# Patient Record
Sex: Female | Born: 1960 | Race: Black or African American | Hispanic: No | Marital: Single | Smoking: Current every day smoker
Health system: Southern US, Community
[De-identification: ages and names within clinical notes are randomized; demographics above are authoritative.]

## PROBLEM LIST (undated history)

## (undated) DIAGNOSIS — I10 Essential (primary) hypertension: Secondary | ICD-10-CM

## (undated) DIAGNOSIS — C3490 Malignant neoplasm of unspecified part of unspecified bronchus or lung: Secondary | ICD-10-CM

## (undated) DIAGNOSIS — J449 Chronic obstructive pulmonary disease, unspecified: Secondary | ICD-10-CM

## (undated) DIAGNOSIS — E079 Disorder of thyroid, unspecified: Secondary | ICD-10-CM

## (undated) DIAGNOSIS — F0390 Unspecified dementia without behavioral disturbance: Secondary | ICD-10-CM

## (undated) HISTORY — DX: Malignant neoplasm of unspecified part of unspecified bronchus or lung: C34.90

## (undated) HISTORY — DX: Essential (primary) hypertension: I10

## (undated) HISTORY — PX: STOMACH SURGERY: SHX791

---

## 2002-08-19 ENCOUNTER — Inpatient Hospital Stay (HOSPITAL_COMMUNITY): Admission: AC | Admit: 2002-08-19 | Discharge: 2002-08-26 | Payer: Self-pay

## 2002-08-19 ENCOUNTER — Encounter: Payer: Self-pay | Admitting: Emergency Medicine

## 2002-08-19 ENCOUNTER — Encounter: Payer: Self-pay | Admitting: General Surgery

## 2002-08-20 ENCOUNTER — Encounter: Payer: Self-pay | Admitting: General Surgery

## 2002-08-21 ENCOUNTER — Encounter: Payer: Self-pay | Admitting: General Surgery

## 2002-08-22 ENCOUNTER — Encounter: Payer: Self-pay | Admitting: General Surgery

## 2002-08-23 ENCOUNTER — Encounter: Payer: Self-pay | Admitting: General Surgery

## 2003-01-14 ENCOUNTER — Encounter: Payer: Self-pay | Admitting: *Deleted

## 2003-01-14 ENCOUNTER — Emergency Department (HOSPITAL_COMMUNITY): Admission: EM | Admit: 2003-01-14 | Discharge: 2003-01-14 | Payer: Self-pay | Admitting: *Deleted

## 2003-03-21 ENCOUNTER — Encounter: Payer: Self-pay | Admitting: Orthopedic Surgery

## 2003-03-21 ENCOUNTER — Ambulatory Visit (HOSPITAL_COMMUNITY): Admission: RE | Admit: 2003-03-21 | Discharge: 2003-03-21 | Payer: Self-pay | Admitting: Orthopedic Surgery

## 2005-12-17 ENCOUNTER — Emergency Department: Payer: Self-pay | Admitting: Emergency Medicine

## 2006-07-02 ENCOUNTER — Observation Stay: Payer: Self-pay | Admitting: Internal Medicine

## 2006-07-02 ENCOUNTER — Other Ambulatory Visit: Payer: Self-pay

## 2006-07-31 ENCOUNTER — Emergency Department: Payer: Self-pay | Admitting: Emergency Medicine

## 2007-02-17 ENCOUNTER — Emergency Department: Payer: Self-pay | Admitting: Emergency Medicine

## 2008-08-04 ENCOUNTER — Emergency Department: Payer: Self-pay | Admitting: Emergency Medicine

## 2008-08-04 ENCOUNTER — Other Ambulatory Visit: Payer: Self-pay

## 2010-06-16 ENCOUNTER — Emergency Department: Payer: Self-pay | Admitting: Emergency Medicine

## 2013-03-04 ENCOUNTER — Emergency Department: Payer: Self-pay | Admitting: Emergency Medicine

## 2013-11-10 ENCOUNTER — Emergency Department: Payer: Self-pay | Admitting: Emergency Medicine

## 2013-11-10 LAB — URINALYSIS, COMPLETE
Bacteria: NONE SEEN
Glucose,UR: NEGATIVE mg/dL (ref 0–75)
Ketone: NEGATIVE
Leukocyte Esterase: NEGATIVE
Ph: 5 (ref 4.5–8.0)
Protein: NEGATIVE
Squamous Epithelial: 11

## 2013-11-10 LAB — CBC WITH DIFFERENTIAL/PLATELET
Basophil %: 1.1 %
Eosinophil #: 0.2 10*3/uL (ref 0.0–0.7)
HCT: 43.1 % (ref 35.0–47.0)
HGB: 14.6 g/dL (ref 12.0–16.0)
Lymphocyte #: 2 10*3/uL (ref 1.0–3.6)
Lymphocyte %: 25.2 %
MCHC: 34 g/dL (ref 32.0–36.0)
Monocyte #: 0.9 x10 3/mm (ref 0.2–0.9)
Neutrophil #: 4.7 10*3/uL (ref 1.4–6.5)
Neutrophil %: 59.3 %
Platelet: 257 10*3/uL (ref 150–440)
RBC: 4.88 10*6/uL (ref 3.80–5.20)
RDW: 13.7 % (ref 11.5–14.5)

## 2013-11-10 LAB — COMPREHENSIVE METABOLIC PANEL
Anion Gap: 6 — ABNORMAL LOW (ref 7–16)
Bilirubin,Total: 0.6 mg/dL (ref 0.2–1.0)
Calcium, Total: 9.7 mg/dL (ref 8.5–10.1)
Chloride: 107 mmol/L (ref 98–107)
Co2: 25 mmol/L (ref 21–32)
EGFR (African American): >60
Osmolality: 273 (ref 275–301)
Potassium: 3.6 mmol/L (ref 3.5–5.1)
SGOT(AST): 20 U/L (ref 15–37)
SGPT (ALT): 12 U/L (ref 12–78)
Sodium: 138 mmol/L (ref 136–145)
Total Protein: 8.5 g/dL — ABNORMAL HIGH (ref 6.4–8.2)

## 2014-10-03 DIAGNOSIS — R221 Localized swelling, mass and lump, neck: Secondary | ICD-10-CM | POA: Insufficient documentation

## 2014-10-03 DIAGNOSIS — K122 Cellulitis and abscess of mouth: Secondary | ICD-10-CM | POA: Insufficient documentation

## 2014-10-03 DIAGNOSIS — H9209 Otalgia, unspecified ear: Secondary | ICD-10-CM | POA: Insufficient documentation

## 2014-10-03 DIAGNOSIS — R070 Pain in throat: Secondary | ICD-10-CM | POA: Insufficient documentation

## 2014-10-13 DIAGNOSIS — C770 Secondary and unspecified malignant neoplasm of lymph nodes of head, face and neck: Secondary | ICD-10-CM | POA: Insufficient documentation

## 2014-10-13 DIAGNOSIS — C3490 Malignant neoplasm of unspecified part of unspecified bronchus or lung: Secondary | ICD-10-CM | POA: Insufficient documentation

## 2015-01-29 ENCOUNTER — Ambulatory Visit: Payer: Self-pay | Admitting: Hematology and Oncology

## 2015-02-20 ENCOUNTER — Ambulatory Visit
Admit: 2015-02-20 | Disposition: A | Discharge status: Home / Self Care | Payer: Self-pay | Attending: Hematology and Oncology | Admitting: Hematology and Oncology

## 2015-03-06 LAB — CREATININE, SERUM: Creatine, Serum: 0.69

## 2015-03-23 ENCOUNTER — Ambulatory Visit
Admit: 2015-03-23 | Disposition: A | Discharge status: Home / Self Care | Payer: Self-pay | Attending: Hematology and Oncology | Admitting: Hematology and Oncology

## 2015-03-26 DIAGNOSIS — C3490 Malignant neoplasm of unspecified part of unspecified bronchus or lung: Secondary | ICD-10-CM | POA: Insufficient documentation

## 2015-03-29 LAB — COMPREHENSIVE METABOLIC PANEL
Albumin: 4.2 g/dL
Alkaline Phosphatase: 88 U/L
Anion Gap: 8 (ref 7–16)
BUN: 10 mg/dL
Bilirubin,Total: 0.4 mg/dL
Calcium, Total: 9.5 mg/dL
Chloride: 104 mmol/L
Co2: 25 mmol/L
Creatinine: 0.65 mg/dL
EGFR (African American): >60
EGFR (Non-African Amer.): >60
Glucose: 164 mg/dL — ABNORMAL HIGH
Potassium: 3.8 mmol/L
SGOT(AST): 20 U/L
SGPT (ALT): 8 U/L — ABNORMAL LOW
Sodium: 137 mmol/L
Total Protein: 8.2 g/dL — ABNORMAL HIGH

## 2015-03-29 LAB — CBC CANCER CENTER
Basophil #: 0 x10 3/mm (ref 0.0–0.1)
Basophil %: 0.4 %
Eosinophil #: 0 x10 3/mm (ref 0.0–0.7)
Eosinophil %: 0 %
HCT: 37.3 % (ref 35.0–47.0)
HGB: 12.6 g/dL (ref 12.0–16.0)
Lymphocyte #: 1.1 x10 3/mm (ref 1.0–3.6)
Lymphocyte %: 13.8 %
MCH: 33.6 pg (ref 26.0–34.0)
MCHC: 33.9 g/dL (ref 32.0–36.0)
MCV: 99 fL (ref 80–100)
Monocyte #: 1.1 x10 3/mm — ABNORMAL HIGH (ref 0.2–0.9)
Monocyte %: 14.9 %
Neutrophil #: 5.4 x10 3/mm (ref 1.4–6.5)
Neutrophil %: 70.9 %
Platelet: 432 x10 3/mm (ref 150–440)
RBC: 3.76 10*6/uL — ABNORMAL LOW (ref 3.80–5.20)
RDW: 16.6 % — ABNORMAL HIGH (ref 11.5–14.5)
WBC: 7.6 x10 3/mm (ref 3.6–11.0)

## 2015-03-29 LAB — MAGNESIUM: Magnesium: 1.6 mg/dL — ABNORMAL LOW

## 2015-03-30 LAB — CEA: CEA: 7 ng/mL — ABNORMAL HIGH (ref 0.0–4.7)

## 2015-04-09 ENCOUNTER — Other Ambulatory Visit: Payer: Self-pay | Admitting: Hematology and Oncology

## 2015-04-09 DIAGNOSIS — C3492 Malignant neoplasm of unspecified part of left bronchus or lung: Secondary | ICD-10-CM

## 2015-04-09 LAB — COMPREHENSIVE METABOLIC PANEL
Albumin: 4 g/dL
Alkaline Phosphatase: 79 U/L
Anion Gap: 8 (ref 7–16)
BUN: 13 mg/dL
Bilirubin,Total: 0.7 mg/dL
Calcium, Total: 9.4 mg/dL
Chloride: 103 mmol/L
Co2: 26 mmol/L
Creatinine: 0.62 mg/dL
EGFR (African American): >60
EGFR (Non-African Amer.): >60
Glucose: 120 mg/dL — ABNORMAL HIGH
Potassium: 4.2 mmol/L
SGOT(AST): 19 U/L
SGPT (ALT): 9 U/L — ABNORMAL LOW
Sodium: 137 mmol/L
Total Protein: 8.2 g/dL — ABNORMAL HIGH

## 2015-04-09 LAB — CBC CANCER CENTER
Basophil #: 0.1 x10 3/mm (ref 0.0–0.1)
Basophil %: 0.7 %
Eosinophil #: 0 x10 3/mm (ref 0.0–0.7)
Eosinophil %: 0.1 %
HCT: 40.9 % (ref 35.0–47.0)
HGB: 13.7 g/dL (ref 12.0–16.0)
Lymphocyte #: 1.1 x10 3/mm (ref 1.0–3.6)
Lymphocyte %: 12.4 %
MCH: 33.5 pg (ref 26.0–34.0)
MCHC: 33.5 g/dL (ref 32.0–36.0)
MCV: 100 fL (ref 80–100)
Monocyte #: 1 x10 3/mm — ABNORMAL HIGH (ref 0.2–0.9)
Monocyte %: 11.5 %
Neutrophil #: 6.9 x10 3/mm — ABNORMAL HIGH (ref 1.4–6.5)
Neutrophil %: 75.3 %
Platelet: 238 x10 3/mm (ref 150–440)
RBC: 4.09 10*6/uL (ref 3.80–5.20)
RDW: 18.6 % — ABNORMAL HIGH (ref 11.5–14.5)
WBC: 9.1 x10 3/mm (ref 3.6–11.0)

## 2015-04-09 LAB — MAGNESIUM: Magnesium: 1.7 mg/dL

## 2015-04-18 ENCOUNTER — Other Ambulatory Visit: Payer: Self-pay | Admitting: Hematology and Oncology

## 2015-04-27 ENCOUNTER — Encounter: Admission: RE | Admit: 2015-04-27 | Payer: Medicaid Other | Source: Ambulatory Visit

## 2015-04-27 ENCOUNTER — Ambulatory Visit: Admission: RE | Admit: 2015-04-27 | Payer: Medicaid Other | Source: Ambulatory Visit

## 2015-04-30 ENCOUNTER — Inpatient Hospital Stay (HOSPITAL_BASED_OUTPATIENT_CLINIC_OR_DEPARTMENT_OTHER): Payer: Medicaid Other | Admitting: Hematology and Oncology

## 2015-04-30 ENCOUNTER — Encounter: Payer: Self-pay | Admitting: Hematology and Oncology

## 2015-04-30 ENCOUNTER — Other Ambulatory Visit: Payer: Self-pay | Admitting: Hematology and Oncology

## 2015-04-30 ENCOUNTER — Inpatient Hospital Stay: Payer: Medicaid Other | Attending: Hematology and Oncology

## 2015-04-30 ENCOUNTER — Inpatient Hospital Stay: Payer: Medicaid Other

## 2015-04-30 VITALS — BP 138/8 | HR 106 | Temp 97.3°F | Resp 18 | Wt 105.8 lb

## 2015-04-30 DIAGNOSIS — J9811 Atelectasis: Secondary | ICD-10-CM | POA: Diagnosis not present

## 2015-04-30 DIAGNOSIS — Z923 Personal history of irradiation: Secondary | ICD-10-CM | POA: Insufficient documentation

## 2015-04-30 DIAGNOSIS — M25512 Pain in left shoulder: Secondary | ICD-10-CM | POA: Diagnosis not present

## 2015-04-30 DIAGNOSIS — R079 Chest pain, unspecified: Secondary | ICD-10-CM | POA: Insufficient documentation

## 2015-04-30 DIAGNOSIS — C3490 Malignant neoplasm of unspecified part of unspecified bronchus or lung: Secondary | ICD-10-CM

## 2015-04-30 DIAGNOSIS — Z79899 Other long term (current) drug therapy: Secondary | ICD-10-CM

## 2015-04-30 DIAGNOSIS — R0602 Shortness of breath: Secondary | ICD-10-CM

## 2015-04-30 DIAGNOSIS — R05 Cough: Secondary | ICD-10-CM | POA: Insufficient documentation

## 2015-04-30 DIAGNOSIS — C779 Secondary and unspecified malignant neoplasm of lymph node, unspecified: Secondary | ICD-10-CM | POA: Insufficient documentation

## 2015-04-30 DIAGNOSIS — Z5111 Encounter for antineoplastic chemotherapy: Secondary | ICD-10-CM | POA: Insufficient documentation

## 2015-04-30 DIAGNOSIS — C3412 Malignant neoplasm of upper lobe, left bronchus or lung: Secondary | ICD-10-CM | POA: Insufficient documentation

## 2015-04-30 DIAGNOSIS — F17210 Nicotine dependence, cigarettes, uncomplicated: Secondary | ICD-10-CM | POA: Diagnosis not present

## 2015-04-30 LAB — CBC WITH DIFFERENTIAL/PLATELET
Basophils Absolute: 0 10*3/uL (ref 0–0.1)
Basophils Relative: 0 %
Eosinophils Absolute: 0 10*3/uL (ref 0–0.7)
Eosinophils Relative: 0 %
HCT: 39 % (ref 35.0–47.0)
Hemoglobin: 13.2 g/dL (ref 12.0–16.0)
Lymphocytes Relative: 16 %
Lymphs Abs: 0.7 10*3/uL — ABNORMAL LOW (ref 1.0–3.6)
MCH: 34.5 pg — ABNORMAL HIGH (ref 26.0–34.0)
MCHC: 33.9 g/dL (ref 32.0–36.0)
MCV: 101.8 fL — ABNORMAL HIGH (ref 80.0–100.0)
Monocytes Absolute: 0.6 10*3/uL (ref 0.2–0.9)
Monocytes Relative: 14 %
Neutro Abs: 3 10*3/uL (ref 1.4–6.5)
Neutrophils Relative %: 70 %
Platelets: 274 10*3/uL (ref 150–440)
RBC: 3.83 MIL/uL (ref 3.80–5.20)
RDW: 19.7 % — ABNORMAL HIGH (ref 11.5–14.5)
WBC: 4.3 10*3/uL (ref 3.6–11.0)

## 2015-04-30 LAB — COMPREHENSIVE METABOLIC PANEL
ALT: 10 U/L — ABNORMAL LOW (ref 14–54)
AST: 23 U/L (ref 15–41)
Albumin: 4.2 g/dL (ref 3.5–5.0)
Alkaline Phosphatase: 64 U/L (ref 38–126)
Anion gap: 9 (ref 5–15)
BUN: 11 mg/dL (ref 6–20)
CO2: 27 mmol/L (ref 22–32)
Calcium: 9.6 mg/dL (ref 8.9–10.3)
Chloride: 103 mmol/L (ref 101–111)
Creatinine, Ser: 0.6 mg/dL (ref 0.44–1.00)
GFR calc Af Amer: >60 mL/min (ref >60)
GFR calc non Af Amer: >60 mL/min (ref >60)
Glucose, Bld: 127 mg/dL — ABNORMAL HIGH (ref 65–99)
Potassium: 4.1 mmol/L (ref 3.5–5.1)
Sodium: 139 mmol/L (ref 135–145)
Total Bilirubin: 0.7 mg/dL (ref 0.3–1.2)
Total Protein: 8.1 g/dL (ref 6.5–8.1)

## 2015-04-30 LAB — MAGNESIUM: Magnesium: 1.6 mg/dL — ABNORMAL LOW (ref 1.7–2.4)

## 2015-04-30 MED ORDER — SODIUM CHLORIDE 0.9 % IV SOLN
500.0000 mg/m2 | Freq: Once | INTRAVENOUS | Status: AC
  Administered 2015-04-30: 700 mg via INTRAVENOUS
  Filled 2015-04-30: qty 8

## 2015-04-30 MED ORDER — SODIUM CHLORIDE 0.9 % IV SOLN
Freq: Once | INTRAVENOUS | Status: AC
  Administered 2015-04-30: 13:00:00 via INTRAVENOUS
  Filled 2015-04-30: qty 5

## 2015-04-30 MED ORDER — MEGESTROL ACETATE 40 MG/ML PO SUSP: 200.0000 mg | Freq: Two times a day (BID) | ORAL | Status: DC

## 2015-04-30 MED ORDER — DEXAMETHASONE 4 MG PO TABS: 4.0000 mg | ORAL_TABLET | Freq: Two times a day (BID) | ORAL | Status: DC

## 2015-04-30 MED ORDER — SODIUM CHLORIDE 0.9 % IV SOLN
Freq: Once | INTRAVENOUS | Status: AC
Start: 2015-04-30 — End: 2015-04-30
  Administered 2015-04-30: 13:00:00 via INTRAVENOUS
  Filled 2015-04-30: qty 250

## 2015-04-30 MED ORDER — OMEPRAZOLE 20 MG PO CPDR: 20.0000 mg | DELAYED_RELEASE_CAPSULE | Freq: Every day | ORAL | Status: DC

## 2015-04-30 MED ORDER — CARBOPLATIN CHEMO INJECTION 600 MG/60ML
410.0000 mg | Freq: Once | INTRAVENOUS | Status: AC
  Administered 2015-04-30: 410 mg via INTRAVENOUS
  Filled 2015-04-30: qty 41

## 2015-05-01 LAB — CEA: CEA: 8.7 ng/mL — ABNORMAL HIGH (ref 0.0–4.7)

## 2015-05-01 NOTE — Progress Notes (Signed)
[facility] day:  04/30/2015  Chief Complaint: Audrey Mcgee is an 54 y.o. female with metastatic poorly differentiated non-small cell lung cancer who is seen for  assessment prior to cycle #6 carboplatin and Alimta.  HPI: The patient was last seen in the medical oncology clinic on 04/09/2015.  At that time, she received cycle #5 carboplatin and Alimta. She received B12. She had just completed a course of azithromycin. Respiratory status was at baseline. Bone scan was scheduled to assess left rib and scapular pain.  Symptomatically, she feels pretty good.  She has a chronic foamy cough.  She has a "little shortness of breath now and then".  Her rib pain is gone.  She missed her appointment for a bone scan.  She has pain when she wakes up (back and left shoulder).  She is able to sleep through out the night.  She is not taking the morphine as it causes nausea.  She will take oxycodone up to 4 times a day.  She is gaining weight on Megace.  Past Medical History  Diagnosis Date  . Lung cancer    History reviewed. No pertinent past surgical history.  History reviewed. No pertinent family history.  Social History:  reports that she has been smoking Cigarettes.  She has smoked for the past 15 years. She has never used smokeless tobacco. She reports that she drinks about 1.2 oz of alcohol per week. She reports that she does not use illicit drugs.  The patient is alone today.  Allergies:  Allergies  Allergen Reactions  . No Known Allergies     Current Outpatient Prescriptions  Medication Sig Dispense Refill  . dexamethasone (DECADRON) 4 MG tablet Take 1 tablet (4 mg total) by mouth 2 (two) times daily. Take on the day before and after chemotherapy 16 tablet 0  . DIPHENHYD-LIDOCAINE-NYSTATIN MT Use as directed in the mouth or throat.    . docusate sodium (COLACE) 100 MG capsule Take 100 mg by mouth 2 (two) times daily as needed for mild  constipation.    . folic acid (FOLVITE) 939 MCG tablet Take 400 mcg by mouth daily.    [name] losartan (COZAAR) 100 MG tablet Take 100 mg by mouth daily.    . megestrol (MEGACE) 40 MG/ML suspension Take 5 mLs (200 mg total) by mouth 2 (two) times daily. 240 mL 0  . morphine (MS CONTIN) 15 MG 12 hr tablet Take 15 mg by mouth 2 (two) times daily.    [name] omeprazole (PRILOSEC) 20 MG capsule Take 1 capsule (20 mg total) by mouth daily. 1 capsule 3  . ondansetron (ZOFRAN) 8 MG tablet Take by mouth 2 (two) times daily as needed for nausea or vomiting.    . Oxycodone HCl 10 MG TABS Take by mouth every 4 (four) hours as needed.     No current facility-administered medications for this visit.   Review of Systems:  GENERAL:  Feels pretty good.  Active.  No fevers, sweats or weight loss. PERFORMANCE STATUS (ECOG):  1 HEENT:  No visual changes, runny nose, sore throat, mouth sores or tenderness. Lungs: No shortness of breath or cough.  No hemoptysis. Cardiac:  No chest pain, palpitations, orthopnea, or PND. GI:  No nausea, vomiting, diarrhea, constipation, melena or hematochezia. GU:  No urgency, frequency, dysuria, or hematuria. Musculoskeletal:  No back pain.  No joint pain.  No muscle tenderness. Extremities:  No pain or swelling. Skin:  No rashes  or skin changes. Neuro:  No headache, numbness or weakness, balance or coordination issues. Endocrine:  No diabetes, thyroid issues, hot flashes or night sweats. Psych:  No mood changes, depression or anxiety. Pain:  No focal pain. Review of systems:  All other systems reviewed and found to be negative.  Physical Exam: Blood pressure 138/8, pulse 106, temperature 97.3 F (36.3 C), temperature source Tympanic, resp. rate 18, weight 105 lb 13.1 oz (48 kg), SpO2 100 %.  GENERAL:  Thin woman sitting comfortably in the exam room in no acute distress. MENTAL STATUS:  Alert and oriented to person, place and time. HEAD:  Styled short brown wig with highlights.   Normocephalic, atraumatic, face symmetric, no Cushingoid features. EYES:  Brown eyes.  Pupils equal round and reactive to light and accomodation.  No conjunctivitis or scleral icterus. ENT:  Oropharynx clear without lesion.  Tongue normal. Mucous membranes moist.  RESPIRATORY:  Clear to auscultation without rales, wheezes or rhonchi. CARDIOVASCULAR:  Regular rate and rhythm without murmur, rub or gallop. ABDOMEN:  Soft, non-tender, with active bowel sounds, and no hepatosplenomegaly.  No masses. SKIN:  No rashes, ulcers or lesions. EXTREMITIES: No edema, no skin discoloration or tenderness.  No palpable cords. LYMPH NODES: No palpable cervical, supraclavicular, axillary or inguinal adenopathy  NEUROLOGICAL: Unremarkable. PSYCH:  Appropriate.  Infusion on 04/30/2015  Component Date Value Ref Range Status  . WBC 04/30/2015 4.3  3.6 - 11.0 K/uL Final  . RBC 04/30/2015 3.83  3.80 - 5.20 MIL/uL Final  . Hemoglobin 04/30/2015 13.2  12.0 - 16.0 g/dL Final  . HCT 04/30/2015 39.0  35.0 - 47.0 % Final  . MCV 04/30/2015 101.8* 80.0 - 100.0 fL Final  . MCH 04/30/2015 34.5* 26.0 - 34.0 pg Final  . MCHC 04/30/2015 33.9  32.0 - 36.0 g/dL Final  . RDW 04/30/2015 19.7* 11.5 - 14.5 % Final  . Platelets 04/30/2015 274  150 - 440 K/uL Final  . Neutrophils Relative % 04/30/2015 70   Final  . Neutro Abs 04/30/2015 3.0  1.4 - 6.5 K/uL Final  . Lymphocytes Relative 04/30/2015 16   Final  . Lymphs Abs 04/30/2015 0.7* 1.0 - 3.6 K/uL Final  . Monocytes Relative 04/30/2015 14   Final  . Monocytes Absolute 04/30/2015 0.6  0.2 - 0.9 K/uL Final  . Eosinophils Relative 04/30/2015 0   Final  . Eosinophils Absolute 04/30/2015 0.0  0 - 0.7 K/uL Final  . Basophils Relative 04/30/2015 0   Final  . Basophils Absolute 04/30/2015 0.0  0 - 0.1 K/uL Final  . Sodium 04/30/2015 139  135 - 145 mmol/L Final  . Potassium 04/30/2015 4.1  3.5 - 5.1 mmol/L Final  . Chloride 04/30/2015 103  101 - 111 mmol/L Final  . CO2 04/30/2015  27  22 - 32 mmol/L Final  . Glucose, Bld 04/30/2015 127* 65 - 99 mg/dL Final  . BUN 04/30/2015 11  6 - 20 mg/dL Final  . Creatinine, Ser 04/30/2015 0.60  0.44 - 1.00 mg/dL Final  . Calcium 04/30/2015 9.6  8.9 - 10.3 mg/dL Final  . Total Protein 04/30/2015 8.1  6.5 - 8.1 g/dL Final  . Albumin 04/30/2015 4.2  3.5 - 5.0 g/dL Final  . AST 04/30/2015 23  15 - 41 U/L Final  . ALT 04/30/2015 10* 14 - 54 U/L Final  . Alkaline Phosphatase 04/30/2015 64  38 - 126 U/L Final  . Total Bilirubin 04/30/2015 0.7  0.3 - 1.2 mg/dL Final  . GFR calc  non Af Amer 04/30/2015 >60  >60 mL/min Final  . GFR calc Af Amer 04/30/2015 >60  >60 mL/min Final   Comment: (NOTE) The eGFR has been calculated using the CKD EPI equation. This calculation has not been validated in all clinical situations. eGFR's persistently <60 mL/min signify possible Chronic Kidney Disease.   . Anion gap 04/30/2015 9  5 - 15 Final  . Magnesium 04/30/2015 1.6* 1.7 - 2.4 mg/dL Final  . CEA 04/30/2015 8.7* 0.0 - 4.7 ng/mL Final   Comment: (NOTE)       Roche ECLIA methodology       Nonsmokers  <3.9                                     Smokers     <5.6 Performed At: [facility] [place], [place] [id number] Lindon Romp MD HQ:[id number]     Assessment:  Audrey Mcgee is an 54 y.o. African-American woman with metastatic poorly differentiated adenocarcinoma of lung.  She presented with a 5 month history of enlarging bilateral neck masses, progressive hoarseness, dysphagia, dyspnea on exertion, cough, left sided hearing loss, neck pain, and a 30 pound weight loss.  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  Biopsy of the right supraclavicular lymph node on 10/03/2014 revealed poorly differentiated non-small cell carcinoma, favoring adenocarcinoma consistent with lung primary (TTF-1 positive).  KRAS was positive.    PET scan on 10/24/2014 revealed a left hilar mass  with associated left upper lobe obstruction/atelectasis with extensive bilateral mediastinal and cervical adnopathy.  Chest CT from 10/201/2015 also noted a 2.2 x 0.9 cm soft tissue density with internal calcifications in the right breast.  Head MRI revealed no evidence of metastatic disease.  She received palliative radiation (3000 cGy) to the left lung from 10/24/2014 - 11/07/2014.  She has received 3 cycles of carboplatin and Alimta (11/14/2014, 12/05/2014, and 02/13/2015). Last B12 was given on 04/09/2015.   She has pain in her left chest, throat, and shoulder.  She is on oxycodone 10 mg every 6 hours as needed.  Morphine causes nausea.  She has chronic shortness of breath with exertion.  She is currently status post 5 cycles carboplatin and Alimta (11/14/2014 - 04/09/2015).  There was a delay between cycle #2 and cycle #3 secondary to a switch in caregivers.   She was diagnosed with a left peri-hilar infiltrate on 02/23/2015.  She has completed a course of Levaquin.  Chest CT on 03/21/2015 revealed LUL scarring, patchy ill defined airspace opacities in the lingula, upper esophageal thickening, and small mediastinal and hilar nodes.  She completed a course of Azithromycin.  Symptomatically, her rib pain is gone.  She feels pretty good.  She has a little bit of shortness of breath now and then.  Exam is unremarkable.  Plan: 1. Labs today:  CBC with diff, CMP, CEA, Mg 2. Cycle #6 carboplatin and Alimta today 3. Next B12 due 06/11/2015 4. RTC in 3 weeks for MD assessment, labs (CBC, CMP, CEA, Mg) and cycle #1 maintenance Alimta.  Lequita Asal, MD  05/01/2015, 10:03 AM

## 2015-05-17 ENCOUNTER — Other Ambulatory Visit: Payer: Self-pay

## 2015-05-17 DIAGNOSIS — C3490 Malignant neoplasm of unspecified part of unspecified bronchus or lung: Secondary | ICD-10-CM

## 2015-05-22 ENCOUNTER — Inpatient Hospital Stay: Payer: Medicaid Other

## 2015-05-22 ENCOUNTER — Encounter: Payer: Self-pay | Admitting: Hematology and Oncology

## 2015-05-22 ENCOUNTER — Inpatient Hospital Stay (HOSPITAL_BASED_OUTPATIENT_CLINIC_OR_DEPARTMENT_OTHER): Payer: Medicaid Other | Admitting: Hematology and Oncology

## 2015-05-22 ENCOUNTER — Encounter (INDEPENDENT_AMBULATORY_CARE_PROVIDER_SITE_OTHER): Payer: Self-pay

## 2015-05-22 VITALS — BP 143/96 | HR 102 | Temp 97.7°F | Resp 19 | Wt 109.8 lb

## 2015-05-22 DIAGNOSIS — R079 Chest pain, unspecified: Secondary | ICD-10-CM | POA: Diagnosis not present

## 2015-05-22 DIAGNOSIS — Z79899 Other long term (current) drug therapy: Secondary | ICD-10-CM | POA: Diagnosis not present

## 2015-05-22 DIAGNOSIS — M25512 Pain in left shoulder: Secondary | ICD-10-CM

## 2015-05-22 DIAGNOSIS — F17210 Nicotine dependence, cigarettes, uncomplicated: Secondary | ICD-10-CM

## 2015-05-22 DIAGNOSIS — C3490 Malignant neoplasm of unspecified part of unspecified bronchus or lung: Secondary | ICD-10-CM

## 2015-05-22 DIAGNOSIS — C3412 Malignant neoplasm of upper lobe, left bronchus or lung: Secondary | ICD-10-CM | POA: Diagnosis not present

## 2015-05-22 DIAGNOSIS — R0602 Shortness of breath: Secondary | ICD-10-CM

## 2015-05-22 DIAGNOSIS — J9811 Atelectasis: Secondary | ICD-10-CM

## 2015-05-22 DIAGNOSIS — Z923 Personal history of irradiation: Secondary | ICD-10-CM

## 2015-05-22 DIAGNOSIS — C779 Secondary and unspecified malignant neoplasm of lymph node, unspecified: Secondary | ICD-10-CM

## 2015-05-22 DIAGNOSIS — R05 Cough: Secondary | ICD-10-CM

## 2015-05-22 DIAGNOSIS — Z5111 Encounter for antineoplastic chemotherapy: Secondary | ICD-10-CM | POA: Diagnosis not present

## 2015-05-22 LAB — COMPREHENSIVE METABOLIC PANEL
ALT: 9 U/L — ABNORMAL LOW (ref 14–54)
AST: 23 U/L (ref 15–41)
Albumin: 4.2 g/dL (ref 3.5–5.0)
Alkaline Phosphatase: 62 U/L (ref 38–126)
Anion gap: 9 (ref 5–15)
BUN: 13 mg/dL (ref 6–20)
CO2: 24 mmol/L (ref 22–32)
Calcium: 9.2 mg/dL (ref 8.9–10.3)
Chloride: 104 mmol/L (ref 101–111)
Creatinine, Ser: 0.63 mg/dL (ref 0.44–1.00)
GFR calc Af Amer: >60 mL/min (ref >60)
GFR calc non Af Amer: >60 mL/min (ref >60)
Glucose, Bld: 142 mg/dL — ABNORMAL HIGH (ref 65–99)
Potassium: 3.9 mmol/L (ref 3.5–5.1)
Sodium: 137 mmol/L (ref 135–145)
Total Bilirubin: 0.4 mg/dL (ref 0.3–1.2)
Total Protein: 7.6 g/dL (ref 6.5–8.1)

## 2015-05-22 LAB — MAGNESIUM: Magnesium: 1.7 mg/dL (ref 1.7–2.4)

## 2015-05-22 LAB — CBC
HCT: 34.8 % — ABNORMAL LOW (ref 35.0–47.0)
Hemoglobin: 11.8 g/dL — ABNORMAL LOW (ref 12.0–16.0)
MCH: 35.9 pg — ABNORMAL HIGH (ref 26.0–34.0)
MCHC: 34 g/dL (ref 32.0–36.0)
MCV: 105.7 fL — ABNORMAL HIGH (ref 80.0–100.0)
Platelets: 327 10*3/uL (ref 150–440)
RBC: 3.29 MIL/uL — ABNORMAL LOW (ref 3.80–5.20)
RDW: 17.5 % — ABNORMAL HIGH (ref 11.5–14.5)
WBC: 6.3 10*3/uL (ref 3.6–11.0)

## 2015-05-22 MED ORDER — OXYCODONE HCL 10 MG PO TABS: 10.0000 mg | ORAL_TABLET | ORAL | Status: DC | PRN

## 2015-05-22 MED ORDER — SODIUM CHLORIDE 0.9 % IV SOLN
700.0000 mg | Freq: Once | INTRAVENOUS | Status: AC
  Administered 2015-05-22: 700 mg via INTRAVENOUS
  Filled 2015-05-22: qty 24

## 2015-05-22 MED ORDER — DEXAMETHASONE SODIUM PHOSPHATE 100 MG/10ML IJ SOLN
Freq: Once | INTRAMUSCULAR | Status: AC
  Administered 2015-05-22: 16:00:00 via INTRAVENOUS
  Filled 2015-05-22: qty 4

## 2015-05-22 MED ORDER — SODIUM CHLORIDE 0.9 % IV SOLN
Freq: Once | INTRAVENOUS | Status: AC
  Administered 2015-05-22: 16:00:00 via INTRAVENOUS
  Filled 2015-05-22: qty 1000

## 2015-05-23 LAB — CEA: CEA: 7.6 ng/mL — ABNORMAL HIGH (ref 0.0–4.7)

## 2015-05-28 ENCOUNTER — Other Ambulatory Visit: Payer: Self-pay | Admitting: *Deleted

## 2015-05-28 MED ORDER — MEGESTROL ACETATE 40 MG/ML PO SUSP: 200.0000 mg | Freq: Two times a day (BID) | ORAL | Status: DC

## 2015-05-28 NOTE — Addendum Note (Signed)
Addended by: Betti Cruz on: 05/28/2015 02:27 PM   Modules accepted: Orders, Medications

## 2015-06-03 ENCOUNTER — Encounter: Payer: Self-pay | Admitting: Hematology and Oncology

## 2015-06-03 NOTE — Progress Notes (Signed)
[facility] day:  05/22/2015  Chief Complaint: Audrey Mcgee is an 54 y.o. female with metastatic poorly differentiated non-small cell lung cancer who is seen for assessment prior to cycle #1 maintenance Alimta.  HPI: The patient was last seen in the medical oncology clinic on 04/30/2015.  At that time, her rib pain had resolved. She received cycle #6 carboplatin and Alimta uneventfully.  During the interim, she has done well.  She states that she is gaining weight.  She denies any bone pain.  Past Medical History  Diagnosis Date  . Lung cancer   . Hypertension     Past Surgical History  Procedure Laterality Date  . Stomach surgery      Pt reports for a tumor    No family history on file.  Social History:  reports that she has been smoking Cigarettes.  She has smoked for the past 15 years. She has never used smokeless tobacco. She reports that she drinks about 1.2 oz of alcohol per week. She reports that she does not use illicit drugs.  The patient is alone today.  Allergies:  Allergies  Allergen Reactions  . No Known Allergies     Current Medications: Current Outpatient Prescriptions  Medication Sig Dispense Refill  . dexamethasone (DECADRON) 4 MG tablet Take 1 tablet (4 mg total) by mouth 2 (two) times daily. Take on the day before and after chemotherapy 16 tablet 0  . DIPHENHYD-LIDOCAINE-NYSTATIN MT Use as directed in the mouth or throat.    . docusate sodium (COLACE) 100 MG capsule Take 100 mg by mouth 2 (two) times daily as needed for mild constipation.    . folic acid (FOLVITE) 035 MCG tablet Take 400 mcg by mouth daily.    [name] losartan (COZAAR) 100 MG tablet Take 100 mg by mouth daily.    [name] morphine (MS CONTIN) 15 MG 12 hr tablet Take 15 mg by mouth 2 (two) times daily.    [name] omeprazole (PRILOSEC) 20 MG capsule Take 1 capsule (20 mg total) by mouth daily. 1 capsule 3  . ondansetron (ZOFRAN) 8 MG tablet Take by mouth  2 (two) times daily as needed for nausea or vomiting.    . Oxycodone HCl 10 MG TABS Take 1 tablet (10 mg total) by mouth every 4 (four) hours as needed. 90 tablet 0  . megestrol (MEGACE) 40 MG/ML suspension Take 5 mLs (200 mg total) by mouth 2 (two) times daily. 240 mL 0   No current facility-administered medications for this visit.    Review of Systems:  GENERAL: Feels good. No fevers, sweats or weight loss.  Gaining weight. PERFORMANCE STATUS (ECOG): 1 HEENT: No visual changes, runny nose, sore throat, mouth sores or tenderness. Lungs: No shortness of breath or cough. No hemoptysis. Cardiac: No chest pain, palpitations, orthopnea, or PND. GI: No nausea, vomiting, diarrhea, constipation, melena or hematochezia. GU: No urgency, frequency, dysuria, or hematuria. Musculoskeletal: No back pain. No joint pain. No muscle tenderness. Extremities: No pain or swelling. Skin: No rashes or skin changes. Neuro: No headache, numbness or weakness, balance or coordination issues. Endocrine: No diabetes, thyroid issues, hot flashes or night sweats. Psych: No mood changes, depression or anxiety. Pain: No focal pain. Review of systems: All other systems reviewed and found to be negative.  Physical Exam: Blood pressure 143/96, pulse 102, temperature 97.7 F (36.5 C), temperature source Oral, resp. rate 19, weight 109 lb 12.6 oz (49.8 kg).  GENERAL: Thin  woman sitting comfortably in the exam room in no acute distress. MENTAL STATUS: Alert and oriented to person, place and time. HEAD: Styled short brown wig with highlights. Normocephalic, atraumatic, face symmetric, no Cushingoid features. EYES: Brown eyes. Pupils equal round and reactive to light and accomodation. No conjunctivitis or scleral icterus. ENT: Oropharynx clear without lesion. Tongue normal. Mucous membranes moist.  RESPIRATORY: Clear to auscultation without rales, wheezes or rhonchi. CARDIOVASCULAR: Regular  rate and rhythm without murmur, rub or gallop. ABDOMEN: Soft, non-tender, with active bowel sounds, and no hepatosplenomegaly. No masses. SKIN: No rashes, ulcers or lesions. EXTREMITIES: No edema, no skin discoloration or tenderness. No palpable cords. LYMPH NODES: No palpable cervical, supraclavicular, axillary or inguinal adenopathy  NEUROLOGICAL: Unremarkable. PSYCH: Appropriate.  Appointment on 05/22/2015  Component Date Value Ref Range Status  . WBC 05/22/2015 6.3  3.6 - 11.0 K/uL Final  . RBC 05/22/2015 3.29* 3.80 - 5.20 MIL/uL Final  . Hemoglobin 05/22/2015 11.8* 12.0 - 16.0 g/dL Final  . HCT 05/22/2015 34.8* 35.0 - 47.0 % Final  . MCV 05/22/2015 105.7* 80.0 - 100.0 fL Final  . MCH 05/22/2015 35.9* 26.0 - 34.0 pg Final  . MCHC 05/22/2015 34.0  32.0 - 36.0 g/dL Final  . RDW 05/22/2015 17.5* 11.5 - 14.5 % Final  . Platelets 05/22/2015 327  150 - 440 K/uL Final  . Sodium 05/22/2015 137  135 - 145 mmol/L Final  . Potassium 05/22/2015 3.9  3.5 - 5.1 mmol/L Final  . Chloride 05/22/2015 104  101 - 111 mmol/L Final  . CO2 05/22/2015 24  22 - 32 mmol/L Final  . Glucose, Bld 05/22/2015 142* 65 - 99 mg/dL Final  . BUN 05/22/2015 13  6 - 20 mg/dL Final  . Creatinine, Ser 05/22/2015 0.63  0.44 - 1.00 mg/dL Final  . Calcium 05/22/2015 9.2  8.9 - 10.3 mg/dL Final  . Total Protein 05/22/2015 7.6  6.5 - 8.1 g/dL Final  . Albumin 05/22/2015 4.2  3.5 - 5.0 g/dL Final  . AST 05/22/2015 23  15 - 41 U/L Final  . ALT 05/22/2015 9* 14 - 54 U/L Final  . Alkaline Phosphatase 05/22/2015 62  38 - 126 U/L Final  . Total Bilirubin 05/22/2015 0.4  0.3 - 1.2 mg/dL Final  . GFR calc non Af Amer 05/22/2015 >60  >60 mL/min Final  . GFR calc Af Amer 05/22/2015 >60  >60 mL/min Final   Comment: (NOTE) The eGFR has been calculated using the CKD EPI equation. This calculation has not been validated in all clinical situations. eGFR's persistently <60 mL/min signify possible Chronic Kidney Disease.   .  Anion gap 05/22/2015 9  5 - 15 Final  . CEA 05/22/2015 7.6* 0.0 - 4.7 ng/mL Final   Comment: (NOTE)       Roche ECLIA methodology       Nonsmokers  <3.9                                     Smokers     <5.6 Performed At: [facility] [place], [place] [id number] Lindon Romp MD QB:[id number]   . Magnesium 05/22/2015 1.7  1.7 - 2.4 mg/dL Final    Assessment:  Hendrix Console is an 54 y.o. African-American woman with metastatic poorly differentiated adenocarcinoma of lung. She presented with a 5 month history of enlarging bilateral neck masses, progressive hoarseness, dysphagia, dyspnea on exertion,  cough, left sided hearing loss, neck pain, and a 30 pound weight loss.  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 Biopsy of the right supraclavicular lymph node on 10/03/2014 revealed poorly differentiated non-small cell carcinoma, favoring adenocarcinoma consistent with lung primary (TTF-1 positive). KRAS was positive.   PET scan on 10/24/2014 revealed a left hilar mass with associated left upper lobe obstruction/atelectasis with extensive bilateral mediastinal and cervical adnopathy. Chest CT from 10/10/2014 also noted a 2.2 x 0.9 cm soft tissue density with internal calcifications in the right breast. Head MRI revealed no evidence of metastatic disease.  She received palliative radiation (3000 cGy) to the left lung from 10/24/2014 - 11/07/2014. She has received 3 cycles of carboplatin and Alimta (11/14/2014, 12/05/2014, and 02/13/2015). Last B12 was given on 04/09/2015.   She has pain in her left chest, throat, and shoulder. She is on oxycodone 10 mg every 6 hours as needed. Morphine causes nausea. She has chronic shortness of breath with exertion.  She is status post 6 cycles carboplatin and Alimta (11/14/2014 - 04/30/2015). There was a delay between cycle #2 and cycle #3 secondary to a switch in caregivers.  She  last received B12 on 04/09/2015.  She was diagnosed with a left peri-hilar infiltrate on 02/23/2015. She has completed a course of Levaquin. Chest CT on 03/21/2015 revealed LUL scarring, patchy ill defined airspace opacities in the lingula, upper esophageal thickening, and small mediastinal and hilar nodes. She completed a course of Azithromycin.  Symptomatically, she feels good.  She is gaining weight on Megace.  She denies any rib pain.  Exam is stable.  Plan: 1. Labs today:  CBC with diff, CMP, CEA, Mg 2. Cycle #1 maintenance Alimta today. 3. Remind patient to take steroids day before and after Alimta.  Call if refills needed. 4. Continue folic acid. 5. Next B12 due 06/11/2015. 6. Refill oxcodone. 7. Discuss plan for follow-up imaging end of 05/2015 or beginning of 06/2015. 8. RTC in 3 weeks for MD assessment, labs (CBC, CMP, CEA) and cycle #2 maintenance Alimta.  Audrey Asal, MD  05/22/2015, 4:50 PM

## 2015-06-12 ENCOUNTER — Encounter: Payer: Self-pay | Admitting: Hematology and Oncology

## 2015-06-12 ENCOUNTER — Inpatient Hospital Stay: Payer: Medicaid Other

## 2015-06-12 ENCOUNTER — Other Ambulatory Visit: Payer: Self-pay | Admitting: *Deleted

## 2015-06-12 ENCOUNTER — Inpatient Hospital Stay: Payer: Medicaid Other | Attending: Hematology and Oncology | Admitting: Hematology and Oncology

## 2015-06-12 VITALS — BP 154/103 | HR 87 | Temp 98.3°F | Resp 16 | Wt 113.8 lb

## 2015-06-12 DIAGNOSIS — C3490 Malignant neoplasm of unspecified part of unspecified bronchus or lung: Secondary | ICD-10-CM

## 2015-06-12 DIAGNOSIS — Z5111 Encounter for antineoplastic chemotherapy: Secondary | ICD-10-CM | POA: Insufficient documentation

## 2015-06-12 DIAGNOSIS — R0609 Other forms of dyspnea: Secondary | ICD-10-CM | POA: Diagnosis not present

## 2015-06-12 DIAGNOSIS — F17210 Nicotine dependence, cigarettes, uncomplicated: Secondary | ICD-10-CM | POA: Diagnosis not present

## 2015-06-12 DIAGNOSIS — Z79899 Other long term (current) drug therapy: Secondary | ICD-10-CM | POA: Diagnosis not present

## 2015-06-12 DIAGNOSIS — C3412 Malignant neoplasm of upper lobe, left bronchus or lung: Secondary | ICD-10-CM | POA: Diagnosis not present

## 2015-06-12 DIAGNOSIS — R079 Chest pain, unspecified: Secondary | ICD-10-CM | POA: Diagnosis not present

## 2015-06-12 DIAGNOSIS — I10 Essential (primary) hypertension: Secondary | ICD-10-CM | POA: Insufficient documentation

## 2015-06-12 DIAGNOSIS — M25519 Pain in unspecified shoulder: Secondary | ICD-10-CM | POA: Insufficient documentation

## 2015-06-12 DIAGNOSIS — G893 Neoplasm related pain (acute) (chronic): Secondary | ICD-10-CM

## 2015-06-12 LAB — COMPREHENSIVE METABOLIC PANEL
ALT: 9 U/L — ABNORMAL LOW (ref 14–54)
AST: 26 U/L (ref 15–41)
Albumin: 3.8 g/dL (ref 3.5–5.0)
Alkaline Phosphatase: 58 U/L (ref 38–126)
Anion gap: 8 (ref 5–15)
BUN: 10 mg/dL (ref 6–20)
CO2: 25 mmol/L (ref 22–32)
Calcium: 8.6 mg/dL — ABNORMAL LOW (ref 8.9–10.3)
Chloride: 101 mmol/L (ref 101–111)
Creatinine, Ser: 0.69 mg/dL (ref 0.44–1.00)
GFR calc Af Amer: >60 mL/min (ref >60)
GFR calc non Af Amer: >60 mL/min (ref >60)
Glucose, Bld: 100 mg/dL — ABNORMAL HIGH (ref 65–99)
Potassium: 3.6 mmol/L (ref 3.5–5.1)
Sodium: 134 mmol/L — ABNORMAL LOW (ref 135–145)
Total Bilirubin: 0.5 mg/dL (ref 0.3–1.2)
Total Protein: 7.2 g/dL (ref 6.5–8.1)

## 2015-06-12 LAB — MAGNESIUM: Magnesium: 1.8 mg/dL (ref 1.7–2.4)

## 2015-06-12 MED ORDER — CYANOCOBALAMIN 1000 MCG/ML IJ SOLN
1000.0000 ug | Freq: Once | INTRAMUSCULAR | Status: AC
  Administered 2015-06-12: 1000 ug via INTRAMUSCULAR
  Filled 2015-06-12: qty 1

## 2015-06-12 MED ORDER — MORPHINE SULFATE ER 15 MG PO TBCR: 15.0000 mg | EXTENDED_RELEASE_TABLET | Freq: Two times a day (BID) | ORAL | Status: DC

## 2015-06-12 MED ORDER — MEGESTROL ACETATE 40 MG/ML PO SUSP: 200.0000 mg | Freq: Two times a day (BID) | ORAL | Status: DC

## 2015-06-12 MED ORDER — OXYCODONE HCL 10 MG PO TABS: 10.0000 mg | ORAL_TABLET | ORAL | Status: DC | PRN

## 2015-06-12 NOTE — Progress Notes (Signed)
[facility] day:  06/12/2015  Chief Complaint: Audrey Mcgee is a 54 y.o. female with metastatic poorly differentiated non-small cell lung cancer who is seen for assessment prior to cycle #2 maintenance Alimta.  HPI: The patient was last seen in the medical oncology clinic on 05/22/2015.  At that time, she received cycle #1 maintenance Alimta.  She denied any bone pain.  She was gaining weight.  During the interim, she notes "hurting a little in the shoulder".  She states that the discomfort "moved up" and is a nagging feeling.  She continues to gain weight on Megace (up 4 pounds since last visit).  Past Medical History  Diagnosis Date  . Lung cancer   . Hypertension     Past Surgical History  Procedure Laterality Date  . Stomach surgery      Pt reports for a tumor    History reviewed. No pertinent family history.  Social History:  reports that she has been smoking Cigarettes.  She has smoked for the past 15 years. She has never used smokeless tobacco. She reports that she drinks about 1.2 oz of alcohol per week. She reports that she does not use illicit drugs.  The patient is alone today.  Allergies:  Allergies  Allergen Reactions  . No Known Allergies     Current Medications: Current Outpatient Prescriptions  Medication Sig Dispense Refill  . dexamethasone (DECADRON) 4 MG tablet Take 1 tablet (4 mg total) by mouth 2 (two) times daily. Take on the day before and after chemotherapy 16 tablet 0  . DIPHENHYD-LIDOCAINE-NYSTATIN MT Use as directed in the mouth or throat.    . docusate sodium (COLACE) 100 MG capsule Take 100 mg by mouth 2 (two) times daily as needed for mild constipation.    . folic acid (FOLVITE) 277 MCG tablet Take 400 mcg by mouth daily.    [name] losartan (COZAAR) 100 MG tablet Take 100 mg by mouth daily.    [name] omeprazole (PRILOSEC) 20 MG capsule Take 1 capsule (20 mg total) by mouth daily. 1 capsule 3  .  ondansetron (ZOFRAN) 8 MG tablet Take by mouth 2 (two) times daily as needed for nausea or vomiting.    . megestrol (MEGACE) 40 MG/ML suspension Take 5 mLs (200 mg total) by mouth 2 (two) times daily. 240 mL 0  . morphine (MS CONTIN) 15 MG 12 hr tablet Take 1 tablet (15 mg total) by mouth 2 (two) times daily. 60 tablet 0  . Oxycodone HCl 10 MG TABS Take 1 tablet (10 mg total) by mouth every 4 (four) hours as needed. 90 tablet 0   No current facility-administered medications for this visit.    Review of Systems:  GENERAL: Feels ok. No fevers, sweats or weight loss. Gained 4 pounds in last 3 weeks. PERFORMANCE STATUS (ECOG): 1 HEENT: Eyes runny and itchy (?allergies).  No runny nose, sore throat, mouth sores or tenderness. Lungs: No shortness of breath or cough. No hemoptysis. Cardiac: No chest pain, palpitations, orthopnea, or PND. GI: No nausea, vomiting, diarrhea, constipation, melena or hematochezia. GU: No urgency, frequency, dysuria, or hematuria. Musculoskeletal: Hurting a little in shoulder.  No trauma.  No back pain. No joint pain. No muscle tenderness. Extremities: No pain or swelling. Skin: No rashes or skin changes. Neuro: No headache, numbness or weakness, balance or coordination issues. Endocrine: No diabetes, thyroid issues, hot flashes or night sweats. Psych: No mood changes, depression or anxiety. Pain: Pain  in shoulder (slight). Review of systems: All other systems reviewed and found to be negative.  Physical Exam: Blood pressure 154/103, pulse 87, temperature 98.3 F (36.8 C), temperature source Tympanic, resp. rate 16, weight 113 lb 12.1 oz (51.6 kg), SpO2 100 %.  GENERAL: Thin woman sitting comfortably in the exam room in no acute distress. MENTAL STATUS: Alert and oriented to person, place and time. HEAD: Styled short brown wig with highlights. Normocephalic, atraumatic, face symmetric, no Cushingoid features. EYES: Brown eyes. Pupils equal  round and reactive to light and accomodation. No conjunctivitis or scleral icterus. ENT: Oropharynx clear without lesion. Tongue normal. Mucous membranes moist.  RESPIRATORY: Clear to auscultation without rales, wheezes or rhonchi. CARDIOVASCULAR: Regular rate and rhythm without murmur, rub or gallop. ABDOMEN: Soft, non-tender, with active bowel sounds, and no hepatosplenomegaly. No masses. SKIN: No rashes, ulcers or lesions. EXTREMITIES: No edema, no skin discoloration or tenderness. No palpable cords. LYMPH NODES: No palpable cervical, supraclavicular, axillary or inguinal adenopathy  NEUROLOGICAL: Unremarkable. PSYCH: Appropriate.  Appointment on 06/12/2015  Component Date Value Ref Range Status  . Sodium 06/12/2015 134* 135 - 145 mmol/L Final  . Potassium 06/12/2015 3.6  3.5 - 5.1 mmol/L Final  . Chloride 06/12/2015 101  101 - 111 mmol/L Final  . CO2 06/12/2015 25  22 - 32 mmol/L Final  . Glucose, Bld 06/12/2015 100* 65 - 99 mg/dL Final  . BUN 06/12/2015 10  6 - 20 mg/dL Final  . Creatinine, Ser 06/12/2015 0.69  0.44 - 1.00 mg/dL Final  . Calcium 06/12/2015 8.6* 8.9 - 10.3 mg/dL Final  . Total Protein 06/12/2015 7.2  6.5 - 8.1 g/dL Final  . Albumin 06/12/2015 3.8  3.5 - 5.0 g/dL Final  . AST 06/12/2015 26  15 - 41 U/L Final  . ALT 06/12/2015 9* 14 - 54 U/L Final  . Alkaline Phosphatase 06/12/2015 58  38 - 126 U/L Final  . Total Bilirubin 06/12/2015 0.5  0.3 - 1.2 mg/dL Final  . GFR calc non Af Amer 06/12/2015 >60  >60 mL/min Final  . GFR calc Af Amer 06/12/2015 >60  >60 mL/min Final   Comment: (NOTE) The eGFR has been calculated using the CKD EPI equation. This calculation has not been validated in all clinical situations. eGFR's persistently <60 mL/min signify possible Chronic Kidney Disease.   . Anion gap 06/12/2015 8  5 - 15 Final  . Magnesium 06/12/2015 1.8  1.7 - 2.4 mg/dL Final  . CEA 06/12/2015 8.1* 0.0 - 4.7 ng/mL Final   Comment: (NOTE)       Roche  ECLIA methodology       Nonsmokers  <3.9                                     Smokers     <5.6 Performed At: [facility] [address] [place], [place] [id number] Lindon Romp MD QA:[id number]     Assessment:  Audrey Mcgee is a 54 y.o. female with metastatic poorly differentiated adenocarcinoma of lung. She presented with a 5 month history of enlarging bilateral neck masses, progressive hoarseness, dysphagia, dyspnea on exertion, cough, left sided hearing loss, neck pain, and a 30 pound weight loss.  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 Biopsy of the right supraclavicular lymph node on 10/03/2014 revealed poorly differentiated non-small cell carcinoma, favoring adenocarcinoma consistent with lung primary (TTF-1 positive). KRAS  was positive.   PET scan on 10/24/2014 revealed a left hilar mass with associated left upper lobe obstruction/atelectasis with extensive bilateral mediastinal and cervical adnopathy. Chest CT from 10/10/2014 also noted a 2.2 x 0.9 cm soft tissue density with internal calcifications in the right breast. Head MRI revealed no evidence of metastatic disease.  She received palliative radiation (3000 cGy) to the left lung from 10/24/2014 - 11/07/2014. She has received 3 cycles of carboplatin and Alimta (11/14/2014, 12/05/2014, and 02/13/2015). Last B12 was given on 04/09/2015.   She has pain in her left chest, throat, and shoulder. She is on oxycodone 10 mg every 6 hours as needed. Morphine causes nausea. She has chronic shortness of breath with exertion.  She received 6 cycles carboplatin and Alimta (11/14/2014 - 04/30/2015). There was a delay between cycle #2 and cycle #3 secondary to a switch in caregivers. She last received B12 on 04/09/2015.  She was diagnosed with a left peri-hilar infiltrate on 02/23/2015. She has completed a course of Levaquin. Chest CT on 03/21/2015 revealed LUL scarring,  patchy ill defined airspace opacities in the lingula, upper esophageal thickening, and small mediastinal and hilar nodes. She completed a course of Azithromycin.  She is status post cycle #1 maintenance Alimta (05/22/2015).  Symptomatically, has slight shoulder pain. She continues to gain weight on Megace. Exam is stable.  Plan: 1.  Labs today: CBC with diff, CMP, CEA, Mg. 2.  B12 today. 3.  Refill oxycodone and Megace. 4.  Reschedule Alimta secondary to lateness of hour. 5.  Schedule chest CT and bone scan in 3 weeks. 6.  RTC in 3 weeks for MD assess, labs (CBC with diff, CMP, CEA), review of imaging, and cycle #3 Alimta.   Lequita Asal, MD  06/12/2015, 1:57 PM

## 2015-06-13 LAB — CEA: CEA: 8.1 ng/mL — ABNORMAL HIGH (ref 0.0–4.7)

## 2015-06-14 ENCOUNTER — Ambulatory Visit: Payer: Medicaid Other

## 2015-06-15 ENCOUNTER — Inpatient Hospital Stay: Payer: Medicaid Other

## 2015-06-15 VITALS — BP 136/85 | HR 80 | Temp 97.4°F | Resp 16

## 2015-06-15 DIAGNOSIS — Z5111 Encounter for antineoplastic chemotherapy: Secondary | ICD-10-CM | POA: Diagnosis not present

## 2015-06-15 DIAGNOSIS — C3490 Malignant neoplasm of unspecified part of unspecified bronchus or lung: Secondary | ICD-10-CM

## 2015-06-15 MED ORDER — SODIUM CHLORIDE 0.9 % IV SOLN
Freq: Once | INTRAVENOUS | Status: AC
  Administered 2015-06-15: 14:00:00 via INTRAVENOUS
  Filled 2015-06-15: qty 4

## 2015-06-15 MED ORDER — SODIUM CHLORIDE 0.9 % IV SOLN
700.0000 mg | Freq: Once | INTRAVENOUS | Status: AC
  Administered 2015-06-15: 700 mg via INTRAVENOUS
  Filled 2015-06-15: qty 24

## 2015-06-15 MED ORDER — SODIUM CHLORIDE 0.9 % IV SOLN
Freq: Once | INTRAVENOUS | Status: AC
  Administered 2015-06-15: 14:00:00 via INTRAVENOUS
  Filled 2015-06-15: qty 1000

## 2015-07-05 ENCOUNTER — Other Ambulatory Visit: Payer: Self-pay | Admitting: *Deleted

## 2015-07-05 DIAGNOSIS — C3490 Malignant neoplasm of unspecified part of unspecified bronchus or lung: Secondary | ICD-10-CM

## 2015-07-05 MED ORDER — MEGESTROL ACETATE 40 MG/ML PO SUSP: 200.0000 mg | Freq: Two times a day (BID) | ORAL | Status: DC

## 2015-07-06 ENCOUNTER — Ambulatory Visit: Payer: Medicaid Other

## 2015-07-06 ENCOUNTER — Other Ambulatory Visit: Payer: Medicaid Other

## 2015-07-08 DIAGNOSIS — G893 Neoplasm related pain (acute) (chronic): Secondary | ICD-10-CM | POA: Insufficient documentation

## 2015-07-09 ENCOUNTER — Encounter
Admission: RE | Admit: 2015-07-09 | Discharge: 2015-07-09 | Disposition: A | Discharge status: Home / Self Care | Payer: Medicaid Other | Source: Ambulatory Visit | Attending: Hematology and Oncology | Admitting: Hematology and Oncology

## 2015-07-09 ENCOUNTER — Ambulatory Visit
Admission: RE | Admit: 2015-07-09 | Discharge: 2015-07-09 | Disposition: A | Discharge status: Home / Self Care | Payer: Medicaid Other | Source: Ambulatory Visit | Attending: Hematology and Oncology | Admitting: Hematology and Oncology

## 2015-07-09 DIAGNOSIS — C3490 Malignant neoplasm of unspecified part of unspecified bronchus or lung: Secondary | ICD-10-CM | POA: Insufficient documentation

## 2015-07-09 DIAGNOSIS — C3492 Malignant neoplasm of unspecified part of left bronchus or lung: Secondary | ICD-10-CM

## 2015-07-09 MED ORDER — TECHNETIUM TC 99M MEDRONATE IV KIT
23.6700 | PACK | Freq: Once | INTRAVENOUS | Status: AC | PRN
  Administered 2015-07-09: 23.67 via INTRAVENOUS

## 2015-07-09 MED ORDER — IOHEXOL 300 MG/ML  SOLN
75.0000 mL | Freq: Once | INTRAMUSCULAR | Status: AC | PRN
  Administered 2015-07-09: 75 mL via INTRAVENOUS

## 2015-07-10 ENCOUNTER — Other Ambulatory Visit: Payer: Self-pay

## 2015-07-10 ENCOUNTER — Inpatient Hospital Stay: Payer: Medicaid Other | Attending: Hematology and Oncology | Admitting: Hematology and Oncology

## 2015-07-10 ENCOUNTER — Encounter: Payer: Self-pay | Admitting: Hematology and Oncology

## 2015-07-10 ENCOUNTER — Inpatient Hospital Stay: Payer: Medicaid Other

## 2015-07-10 ENCOUNTER — Telehealth: Payer: Self-pay

## 2015-07-10 VITALS — BP 127/83 | HR 106 | Temp 96.8°F | Ht 62.0 in | Wt 113.5 lb

## 2015-07-10 DIAGNOSIS — Z9221 Personal history of antineoplastic chemotherapy: Secondary | ICD-10-CM | POA: Diagnosis not present

## 2015-07-10 DIAGNOSIS — Z809 Family history of malignant neoplasm, unspecified: Secondary | ICD-10-CM | POA: Insufficient documentation

## 2015-07-10 DIAGNOSIS — C3490 Malignant neoplasm of unspecified part of unspecified bronchus or lung: Secondary | ICD-10-CM

## 2015-07-10 DIAGNOSIS — R070 Pain in throat: Secondary | ICD-10-CM

## 2015-07-10 DIAGNOSIS — R079 Chest pain, unspecified: Secondary | ICD-10-CM | POA: Diagnosis not present

## 2015-07-10 DIAGNOSIS — Z79899 Other long term (current) drug therapy: Secondary | ICD-10-CM

## 2015-07-10 DIAGNOSIS — F17210 Nicotine dependence, cigarettes, uncomplicated: Secondary | ICD-10-CM | POA: Diagnosis not present

## 2015-07-10 DIAGNOSIS — C3412 Malignant neoplasm of upper lobe, left bronchus or lung: Secondary | ICD-10-CM | POA: Insufficient documentation

## 2015-07-10 DIAGNOSIS — M25519 Pain in unspecified shoulder: Secondary | ICD-10-CM | POA: Insufficient documentation

## 2015-07-10 DIAGNOSIS — R05 Cough: Secondary | ICD-10-CM

## 2015-07-10 DIAGNOSIS — G893 Neoplasm related pain (acute) (chronic): Secondary | ICD-10-CM

## 2015-07-10 DIAGNOSIS — Z79818 Long term (current) use of other agents affecting estrogen receptors and estrogen levels: Secondary | ICD-10-CM

## 2015-07-10 DIAGNOSIS — I10 Essential (primary) hypertension: Secondary | ICD-10-CM | POA: Insufficient documentation

## 2015-07-10 DIAGNOSIS — Z923 Personal history of irradiation: Secondary | ICD-10-CM | POA: Diagnosis not present

## 2015-07-10 DIAGNOSIS — R0602 Shortness of breath: Secondary | ICD-10-CM | POA: Diagnosis not present

## 2015-07-10 LAB — CBC WITH DIFFERENTIAL/PLATELET
Basophils Absolute: 0 10*3/uL (ref 0–0.1)
Basophils Relative: 0 %
Eosinophils Absolute: 0 10*3/uL (ref 0–0.7)
Eosinophils Relative: 0 %
HCT: 38.1 % (ref 35.0–47.0)
Hemoglobin: 12.8 g/dL (ref 12.0–16.0)
Lymphocytes Relative: 14 %
Lymphs Abs: 0.6 10*3/uL — ABNORMAL LOW (ref 1.0–3.6)
MCH: 36.1 pg — ABNORMAL HIGH (ref 26.0–34.0)
MCHC: 33.7 g/dL (ref 32.0–36.0)
MCV: 107.3 fL — ABNORMAL HIGH (ref 80.0–100.0)
Monocytes Absolute: 0.5 10*3/uL (ref 0.2–0.9)
Monocytes Relative: 10 %
Neutro Abs: 3.4 10*3/uL (ref 1.4–6.5)
Neutrophils Relative %: 76 %
Platelets: 241 10*3/uL (ref 150–440)
RBC: 3.55 MIL/uL — ABNORMAL LOW (ref 3.80–5.20)
RDW: 14.9 % — ABNORMAL HIGH (ref 11.5–14.5)
WBC: 4.5 10*3/uL (ref 3.6–11.0)

## 2015-07-10 LAB — TSH: TSH: 0.206 u[IU]/mL — ABNORMAL LOW (ref 0.350–4.500)

## 2015-07-10 LAB — FOLATE: Folate: 20.4 ng/mL (ref >5.9)

## 2015-07-10 MED ORDER — DEXAMETHASONE 4 MG PO TABS: 4.0000 mg | ORAL_TABLET | Freq: Two times a day (BID) | ORAL | Status: DC

## 2015-07-10 MED ORDER — OXYCODONE HCL 10 MG PO TABS: 10.0000 mg | ORAL_TABLET | ORAL | Status: DC | PRN

## 2015-07-10 NOTE — Progress Notes (Addendum)
[facility] day:  07/10/2015  Chief Complaint: Audrey Mcgee is a 54 y.o. female with metastatic poorly differentiated non-small cell lung cancer who is seen for review of interval restaging studies and assessment prior to cycle #3 maintenance Alimta.  HPI: The patient was last seen in the medical oncology clinic on 06/12/2015.  At that time, she noted some pain in her shoulder.  Pain was a nagging and " moving up".  Because of the lateness of the hour, she received her Alimta on 06/15/2015. Restaging scans were scheduled.  Chest CT on 07/09/2015 revealed patchy groundglass and areas of nodular consolidation in the left upper lobe and to a lesser extent left lower lobe progressive from 03/21/2015.  An atypical infection, tuberculosis or malignancy could have this appearance.  Bone scan on 07/09/2015 revealed no evidence of osseous metastatic disease.  The patient states that she feels short winded in the morning for about 45 minutes. She is "in and out of shortness of breath". She denies any fever or chills. She has a "foamy cough".  Past Medical History  Diagnosis Date  . Hypertension   . Lung cancer     Past Surgical History  Procedure Laterality Date  . Stomach surgery      Pt reports for a tumor    Family History  Problem Relation Age of Onset  . Hypertension Father   . Cancer Father   . Cancer Maternal Aunt     Social History:  reports that she has been smoking Cigarettes.  She has smoked for the past 15 years. She has never used smokeless tobacco. She reports that she drinks about 1.2 oz of alcohol per week. She reports that she does not use illicit drugs.  The patient is alone today.  Allergies:  Allergies  Allergen Reactions  . No Known Allergies     Current Medications: Current Outpatient Prescriptions  Medication Sig Dispense Refill  . DIPHENHYD-LIDOCAINE-NYSTATIN MT Use as directed in the mouth or throat.    .  docusate sodium (COLACE) 100 MG capsule Take 100 mg by mouth 2 (two) times daily as needed for mild constipation.    . folic acid (FOLVITE) 350 MCG tablet Take 400 mcg by mouth daily.    [name] losartan (COZAAR) 100 MG tablet Take 100 mg by mouth daily.    . megestrol (MEGACE) 40 MG/ML suspension Take 5 mLs (200 mg total) by mouth 2 (two) times daily. 240 mL 0  . morphine (MS CONTIN) 15 MG 12 hr tablet Take 1 tablet (15 mg total) by mouth 2 (two) times daily. 60 tablet 0  . omeprazole (PRILOSEC) 20 MG capsule Take 1 capsule (20 mg total) by mouth daily. 1 capsule 3  . ondansetron (ZOFRAN) 8 MG tablet Take by mouth 2 (two) times daily as needed for nausea or vomiting.    [name] dexamethasone (DECADRON) 4 MG tablet Take 1 tablet (4 mg total) by mouth 2 (two) times daily. Take on the day before and after chemotherapy 16 tablet 0  . Oxycodone HCl 10 MG TABS Take 1 tablet (10 mg total) by mouth every 4 (four) hours as needed. 90 tablet 0   No current facility-administered medications for this visit.    Review of Systems:  GENERAL: Feels ok. No fevers, sweats or weight loss. Weight up 4 pounds. PERFORMANCE STATUS (ECOG): 1 HEENT: No runny nose, sore throat, mouth sores or tenderness. Lungs: "In and out of shortness of breath".  Foamy cough. No hemoptysis. Cardiac: No chest pain, palpitations, orthopnea, or PND. GI: No nausea, vomiting, diarrhea, constipation, melena or hematochezia. GU: No urgency, frequency, dysuria, or hematuria. Musculoskeletal: Shoulder pain.  No back pain. No joint pain. No muscle tenderness. Extremities: No pain or swelling. Skin: No rashes or skin changes. Neuro: No headache, numbness or weakness, balance or coordination issues. Endocrine: No diabetes, thyroid issues, hot flashes or night sweats. Psych: No mood changes, depression or anxiety. Pain: Pain in shoulder (stable). Review of systems: All other systems reviewed and found to be negative.  Physical  Exam: Blood pressure 127/83, pulse 106, temperature 96.8 F (36 C), height 5' 2"  (1.575 m), weight 113 lb 8.6 oz (51.5 kg).  GENERAL: Thin woman sitting comfortably in the exam room in no acute distress. MENTAL STATUS: Alert and oriented to person, place and time. HEAD: Styled short brown wig with highlights. Normocephalic, atraumatic, face symmetric, no Cushingoid features. EYES: Brown eyes. Pupils equal round and reactive to light and accomodation. No conjunctivitis or scleral icterus. ENT: Oropharynx clear without lesion. Tongue normal. Mucous membranes moist.  RESPIRATORY: Clear to auscultation without rales, wheezes or rhonchi. CARDIOVASCULAR: Regular rate and rhythm without murmur, rub or gallop. ABDOMEN: Soft, non-tender, with active bowel sounds, and no hepatosplenomegaly. No masses. SKIN: No rashes, ulcers or lesions. EXTREMITIES: No edema, no skin discoloration or tenderness. No palpable cords. LYMPH NODES: No palpable cervical, supraclavicular, axillary or inguinal adenopathy  NEUROLOGICAL: Unremarkable. PSYCH: Appropriate.  Appointment on 07/10/2015  Component Date Value Ref Range Status  . WBC 07/10/2015 4.5  3.6 - 11.0 K/uL Final  . RBC 07/10/2015 3.55* 3.80 - 5.20 MIL/uL Final  . Hemoglobin 07/10/2015 12.8  12.0 - 16.0 g/dL Final  . HCT 07/10/2015 38.1  35.0 - 47.0 % Final  . MCV 07/10/2015 107.3* 80.0 - 100.0 fL Final  . MCH 07/10/2015 36.1* 26.0 - 34.0 pg Final  . MCHC 07/10/2015 33.7  32.0 - 36.0 g/dL Final  . RDW 07/10/2015 14.9* 11.5 - 14.5 % Final  . Platelets 07/10/2015 241  150 - 440 K/uL Final  . Neutrophils Relative % 07/10/2015 76   Final  . Neutro Abs 07/10/2015 3.4  1.4 - 6.5 K/uL Final  . Lymphocytes Relative 07/10/2015 14   Final  . Lymphs Abs 07/10/2015 0.6* 1.0 - 3.6 K/uL Final  . Monocytes Relative 07/10/2015 10   Final  . Monocytes Absolute 07/10/2015 0.5  0.2 - 0.9 K/uL Final  . Eosinophils Relative 07/10/2015 0   Final  .  Eosinophils Absolute 07/10/2015 0.0  0 - 0.7 K/uL Final  . Basophils Relative 07/10/2015 0   Final  . Basophils Absolute 07/10/2015 0.0  0 - 0.1 K/uL Final    Assessment:  Audrey Mcgee is a 54 y.o. female with metastatic poorly differentiated adenocarcinoma of lung. She presented with a 5 month history of enlarging bilateral neck masses, progressive hoarseness, dysphagia, dyspnea on exertion, cough, left sided hearing loss, neck pain, and a 30 pound weight loss.  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 Biopsy of the right supraclavicular lymph node on 10/03/2014 revealed poorly differentiated non-small cell carcinoma, favoring adenocarcinoma consistent with lung primary (TTF-1 positive). KRAS was positive.   PET scan on 10/24/2014 revealed a left hilar mass with associated left upper lobe obstruction/atelectasis with extensive bilateral mediastinal and cervical adnopathy. Chest CT from 10/10/2014 also noted a 2.2 x 0.9 cm soft tissue density with internal calcifications in the right breast. Head MRI revealed  no evidence of metastatic disease.  She received palliative radiation (3000 cGy) to the left lung from 10/24/2014 - 11/07/2014. She has received 3 cycles of carboplatin and Alimta (11/14/2014, 12/05/2014, and 02/13/2015). Last B12 was given on 04/09/2015.   She has pain in her left chest, throat, and shoulder. She is on oxycodone 10 mg every 6 hours as needed. Morphine causes nausea. She has chronic shortness of breath with exertion.  She received 6 cycles carboplatin and Alimta (11/14/2014 - 04/30/2015). There was a delay between cycle #2 and cycle #3 secondary to a switch in caregivers. She last received B12 on 04/09/2015.  She was diagnosed with a left peri-hilar infiltrate on 02/23/2015. She has completed a course of Levaquin. Chest CT on 03/21/2015 revealed LUL scarring, patchy ill defined airspace opacities in the lingula,  upper esophageal thickening, and small mediastinal and hilar nodes. She completed a course of Azithromycin.  She is status post 2 cycles of maintenance Alimta (05/22/2015 - 06/15/2015).  Chest CT on 07/09/2015 revealed patchy ground glass and nodular consolidation in the left upper lobe and to a lesser extent left lower lobe, minimally progressive from 03/21/2015.  Bone scan on 07/09/2015 revealed no evidence of metastatic disease.  Symptomatically, she is short winded.  She has a lingering wet cough (non-productive).  She denies any fever or chills. Exam is stable.  Plan: 1.  Labs today: CBC with diff, CMP, CEA, Mg. 2.  No chemotherapy today. 3.  Discuss scans today.  Concern for possible infection (TB, MAC) or progressive disease.  Discuss referral to pulmonary medicine for bronchoscopy.   4.  Referral to Dr. Mortimer Fries- nodular LUL infiltrate- discussed. 5.  Refill MSContin. 6.  Patient to call clinic after bronchoscopy for follow-up appointment and discussion regarding direction of therapy.   Lequita Asal, MD  07/10/2015

## 2015-07-10 NOTE — Telephone Encounter (Signed)
Per Dr.[name]'s request, added free T 4 to labs already ordered today; spoke with Bettye in the lab and they were able to add this lab

## 2015-07-10 NOTE — Progress Notes (Signed)
Pt here today for follow up, labs and CT and bone scan results; and treatment # 3Alimta; c/o SOB occasionally; O 2 sats 100 % on RA; needs refills on Decadron and Oxycodone

## 2015-07-11 ENCOUNTER — Other Ambulatory Visit: Payer: Self-pay

## 2015-07-11 ENCOUNTER — Other Ambulatory Visit: Payer: Medicaid Other

## 2015-07-11 ENCOUNTER — Telehealth: Payer: Self-pay

## 2015-07-11 DIAGNOSIS — C3490 Malignant neoplasm of unspecified part of unspecified bronchus or lung: Secondary | ICD-10-CM

## 2015-07-11 DIAGNOSIS — C3412 Malignant neoplasm of upper lobe, left bronchus or lung: Secondary | ICD-10-CM | POA: Diagnosis not present

## 2015-07-11 LAB — T4, FREE: Free T4: 0.58 ng/dL — ABNORMAL LOW (ref 0.61–1.12)

## 2015-07-11 NOTE — Telephone Encounter (Signed)
Spoke with Helene Kelp at the  [facility] [phone] Medical records/referral ); scheduled appointment for tomorrow at 8:20 for follow up/review of thyroid labs ; pt notified and verbalized understanding of this

## 2015-07-19 ENCOUNTER — Inpatient Hospital Stay: Payer: Medicaid Other | Admitting: Internal Medicine

## 2015-07-24 ENCOUNTER — Other Ambulatory Visit: Payer: Self-pay

## 2015-07-24 ENCOUNTER — Other Ambulatory Visit: Payer: Self-pay | Admitting: *Deleted

## 2015-07-24 DIAGNOSIS — C3490 Malignant neoplasm of unspecified part of unspecified bronchus or lung: Secondary | ICD-10-CM

## 2015-07-24 MED ORDER — ONDANSETRON HCL 8 MG PO TABS: 8.0000 mg | ORAL_TABLET | Freq: Two times a day (BID) | ORAL | Status: DC | PRN

## 2015-07-24 MED ORDER — MEGESTROL ACETATE 40 MG/ML PO SUSP: 200.0000 mg | Freq: Two times a day (BID) | ORAL | Status: DC

## 2015-07-24 MED ORDER — LOSARTAN POTASSIUM 100 MG PO TABS: 100.0000 mg | ORAL_TABLET | Freq: Every day | ORAL | Status: DC

## 2015-07-26 ENCOUNTER — Ambulatory Visit: Payer: Medicaid Other | Admitting: Internal Medicine

## 2015-08-02 ENCOUNTER — Inpatient Hospital Stay: Payer: Medicaid Other | Admitting: Internal Medicine

## 2015-08-02 ENCOUNTER — Ambulatory Visit: Payer: Medicaid Other | Admitting: Hematology and Oncology

## 2015-08-07 ENCOUNTER — Telehealth: Payer: Self-pay | Admitting: *Deleted

## 2015-08-07 DIAGNOSIS — C3490 Malignant neoplasm of unspecified part of unspecified bronchus or lung: Secondary | ICD-10-CM

## 2015-08-07 MED ORDER — OXYCODONE HCL 10 MG PO TABS: 10.0000 mg | ORAL_TABLET | ORAL | Status: DC | PRN

## 2015-08-07 MED ORDER — OMEPRAZOLE 20 MG PO CPDR: 20.0000 mg | DELAYED_RELEASE_CAPSULE | Freq: Every day | ORAL | Status: DC

## 2015-08-07 MED ORDER — OMEPRAZOLE 20 MG PO CPDR
20.0000 mg | DELAYED_RELEASE_CAPSULE | Freq: Every day | ORAL | Status: DC
Start: 2015-08-07 — End: 2015-08-07

## 2015-08-07 NOTE — Addendum Note (Signed)
Addended by: Betti Cruz on: 08/07/2015 12:05 PM   Modules accepted: Orders

## 2015-08-07 NOTE — Telephone Encounter (Signed)
Informed that prescription is ready to pick up  

## 2015-08-10 ENCOUNTER — Inpatient Hospital Stay: Payer: Medicaid Other | Attending: Hematology and Oncology | Admitting: Hematology and Oncology

## 2015-08-23 ENCOUNTER — Inpatient Hospital Stay: Payer: Medicaid Other | Admitting: Hematology and Oncology

## 2015-08-23 ENCOUNTER — Telehealth: Payer: Self-pay | Admitting: *Deleted

## 2015-08-23 DIAGNOSIS — C3490 Malignant neoplasm of unspecified part of unspecified bronchus or lung: Secondary | ICD-10-CM

## 2015-08-23 MED ORDER — MEGESTROL ACETATE 40 MG/ML PO SUSP: 200.0000 mg | Freq: Two times a day (BID) | ORAL | Status: DC

## 2015-08-23 NOTE — Telephone Encounter (Signed)
Escribed

## 2015-08-31 ENCOUNTER — Inpatient Hospital Stay: Payer: Medicaid Other | Attending: Oncology | Admitting: Hematology and Oncology

## 2015-08-31 ENCOUNTER — Ambulatory Visit: Payer: Medicaid Other

## 2015-08-31 VITALS — BP 163/20 | HR 111 | Temp 96.8°F | Wt 114.9 lb

## 2015-08-31 DIAGNOSIS — R0602 Shortness of breath: Secondary | ICD-10-CM

## 2015-08-31 DIAGNOSIS — R918 Other nonspecific abnormal finding of lung field: Secondary | ICD-10-CM | POA: Insufficient documentation

## 2015-08-31 DIAGNOSIS — Z9221 Personal history of antineoplastic chemotherapy: Secondary | ICD-10-CM | POA: Insufficient documentation

## 2015-08-31 DIAGNOSIS — F17210 Nicotine dependence, cigarettes, uncomplicated: Secondary | ICD-10-CM | POA: Insufficient documentation

## 2015-08-31 DIAGNOSIS — C3490 Malignant neoplasm of unspecified part of unspecified bronchus or lung: Secondary | ICD-10-CM

## 2015-08-31 DIAGNOSIS — I10 Essential (primary) hypertension: Secondary | ICD-10-CM | POA: Diagnosis not present

## 2015-08-31 DIAGNOSIS — M25519 Pain in unspecified shoulder: Secondary | ICD-10-CM | POA: Diagnosis not present

## 2015-08-31 DIAGNOSIS — Z923 Personal history of irradiation: Secondary | ICD-10-CM | POA: Diagnosis not present

## 2015-08-31 DIAGNOSIS — Z809 Family history of malignant neoplasm, unspecified: Secondary | ICD-10-CM | POA: Insufficient documentation

## 2015-08-31 DIAGNOSIS — R05 Cough: Secondary | ICD-10-CM | POA: Insufficient documentation

## 2015-08-31 DIAGNOSIS — C3412 Malignant neoplasm of upper lobe, left bronchus or lung: Secondary | ICD-10-CM | POA: Insufficient documentation

## 2015-08-31 LAB — CBC WITH DIFFERENTIAL/PLATELET
Basophils Absolute: 0 10*3/uL (ref 0–0.1)
Basophils Relative: 1 %
Eosinophils Absolute: 0 10*3/uL (ref 0–0.7)
Eosinophils Relative: 0 %
HCT: 46.7 % (ref 35.0–47.0)
Hemoglobin: 16 g/dL (ref 12.0–16.0)
Lymphocytes Relative: 27 %
Lymphs Abs: 1.7 10*3/uL (ref 1.0–3.6)
MCH: 34.8 pg — ABNORMAL HIGH (ref 26.0–34.0)
MCHC: 34.3 g/dL (ref 32.0–36.0)
MCV: 101.4 fL — ABNORMAL HIGH (ref 80.0–100.0)
Monocytes Absolute: 0.6 10*3/uL (ref 0.2–0.9)
Monocytes Relative: 10 %
Neutro Abs: 3.8 10*3/uL (ref 1.4–6.5)
Neutrophils Relative %: 62 %
Platelets: 238 10*3/uL (ref 150–440)
RBC: 4.61 MIL/uL (ref 3.80–5.20)
RDW: 12.8 % (ref 11.5–14.5)
WBC: 6.1 10*3/uL (ref 3.6–11.0)

## 2015-08-31 LAB — COMPREHENSIVE METABOLIC PANEL
ALT: 9 U/L — ABNORMAL LOW (ref 14–54)
AST: 22 U/L (ref 15–41)
Albumin: 4.6 g/dL (ref 3.5–5.0)
Alkaline Phosphatase: 55 U/L (ref 38–126)
Anion gap: 8 (ref 5–15)
BUN: 8 mg/dL (ref 6–20)
CO2: 25 mmol/L (ref 22–32)
Calcium: 9.4 mg/dL (ref 8.9–10.3)
Chloride: 103 mmol/L (ref 101–111)
Creatinine, Ser: 0.61 mg/dL (ref 0.44–1.00)
GFR calc Af Amer: >60 mL/min (ref >60)
GFR calc non Af Amer: >60 mL/min (ref >60)
Glucose, Bld: 74 mg/dL (ref 65–99)
Potassium: 3.6 mmol/L (ref 3.5–5.1)
Sodium: 136 mmol/L (ref 135–145)
Total Bilirubin: 0.8 mg/dL (ref 0.3–1.2)
Total Protein: 8.4 g/dL — ABNORMAL HIGH (ref 6.5–8.1)

## 2015-08-31 MED ORDER — MAGIC MOUTHWASH W/LIDOCAINE: 5.0000 mL | Freq: Four times a day (QID) | ORAL | Status: DC | PRN

## 2015-08-31 MED ORDER — OMEPRAZOLE 20 MG PO CPDR: 20.0000 mg | DELAYED_RELEASE_CAPSULE | Freq: Every day | ORAL | Status: DC

## 2015-08-31 MED ORDER — OXYCODONE HCL 10 MG PO TABS: 10.0000 mg | ORAL_TABLET | ORAL | Status: DC | PRN

## 2015-08-31 NOTE — Progress Notes (Signed)
Patient here today for follow up regarding lung cancer.  Patient states she continues to smoke about one cigarette a week.  Requesting refills for omeprazole, oxycodone and nystatin.  Offers no complaints today. BP 163/123 HR 111   Repeat check 141/100  HR 108.  Patient states she has not had her BP meds this morning.

## 2015-08-31 NOTE — Progress Notes (Signed)
[facility] day:  08/31/2015  Chief Complaint: Audrey Mcgee is a 54 y.o. female with metastatic poorly differentiated non-small cell lung cancer who is seen for reassessment.  HPI: The patient was last seen in the medical oncology clinic on 07/10/2015.  At that time, restaging studies were reviewed.  Chest CT from 07/09/2015 had revealed patchy groundglass and an areas of nodular consolidation in the left upper lobe and to a lesser extent left lower lobe of progressive from 03/21/2015.  Concern was raised for an atypical infection such as tuberculosis or MAC. Malignancy was also on the differential. She was referred for a bronchoscopy with Dr. Mortimer Fries.  The patient states that she did not have her bronchoscopy. She states with her last bronchoscopy she couldn't breathe. She is also had some family issues.  Symptomatically, she notes a persistent pain in her shoulder. She still has issues with shortness of breath and a foamy cough.  She denies any hemoptysis.  Weight has been stable on Megace.  Past Medical History  Diagnosis Date  . Hypertension   . Lung cancer     Past Surgical History  Procedure Laterality Date  . Stomach surgery      Pt reports for a tumor    Family History  Problem Relation Age of Onset  . Hypertension Father   . Cancer Father   . Cancer Maternal Aunt     Social History:  reports that she has been smoking Cigarettes.  She has smoked for the past 15 years. She has never used smokeless tobacco. She reports that she drinks about 1.2 oz of alcohol per week. She reports that she does not use illicit drugs.  She states that her nephew shot in [place] (caused delays in her treatment).  Her boyfriend's father recently died of metastatic lung cancer.  The patient is alone today.    Allergies:  Allergies  Allergen Reactions  . No Known Allergies     Current Medications: Current Outpatient Prescriptions   Medication Sig Dispense Refill  . dexamethasone (DECADRON) 4 MG tablet Take 1 tablet (4 mg total) by mouth 2 (two) times daily. Take on the day before and after chemotherapy 16 tablet 0  . DIPHENHYD-LIDOCAINE-NYSTATIN MT Use as directed in the mouth or throat.    . docusate sodium (COLACE) 100 MG capsule Take 100 mg by mouth 2 (two) times daily as needed for mild constipation.    . folic acid (FOLVITE) 950 MCG tablet Take 400 mcg by mouth daily.    [name] losartan (COZAAR) 100 MG tablet Take 1 tablet (100 mg total) by mouth daily. 30 tablet 0  . megestrol (MEGACE) 40 MG/ML suspension Take 5 mLs (200 mg total) by mouth 2 (two) times daily. 240 mL 0  . morphine (MS CONTIN) 15 MG 12 hr tablet Take 1 tablet (15 mg total) by mouth 2 (two) times daily. 60 tablet 0  . omeprazole (PRILOSEC) 20 MG capsule Take 1 capsule (20 mg total) by mouth daily. 30 capsule 3  . ondansetron (ZOFRAN) 8 MG tablet Take 1 tablet (8 mg total) by mouth 2 (two) times daily as needed for nausea or vomiting. 20 tablet 2  . Oxycodone HCl 10 MG TABS Take 1 tablet (10 mg total) by mouth every 4 (four) hours as needed. 90 tablet 0   No current facility-administered medications for this visit.    Review of Systems:  GENERAL: Feels about the same. No fevers, sweats or  weight loss. Weight stable. PERFORMANCE STATUS (ECOG): 1 HEENT: Sensitivity in mouth.  No runny nose, sore throat, mouth sores or tenderness. Lungs:  Chronic shortness of breath.  Foamy cough.  No hemoptysis. Cardiac: No chest pain, palpitations, orthopnea, or PND. GI: No nausea, vomiting, diarrhea, constipation, melena or hematochezia. GU: No urgency, frequency, dysuria, or hematuria. Musculoskeletal: Shoulder pain.  No back pain. No joint pain. No muscle tenderness. Extremities: No pain or swelling. Skin: No rashes or skin changes. Neuro: No headache, numbness or weakness, balance or coordination issues. Endocrine: No diabetes, thyroid issues, hot  flashes or night sweats. Psych: No mood changes, depression or anxiety. Pain: Pain in shoulder (stable). Review of systems: All other systems reviewed and found to be negative.  Physical Exam: Blood pressure 163/20, pulse 111, temperature 96.8 F (36 C), temperature source Tympanic, weight 114 lb 13.8 oz (52.1 kg).  GENERAL: Thin woman sitting comfortably in the exam room in no acute distress. MENTAL STATUS: Alert and oriented to person, place and time. HEAD: Styled short brown wig with highlights. Normocephalic, atraumatic, face symmetric, no Cushingoid features. EYES: Brown eyes. Pupils equal round and reactive to light and accomodation. No conjunctivitis or scleral icterus. ENT: Oropharynx clear without lesion. Tongue normal. Mucous membranes moist.  RESPIRATORY: Intermittent cough.  Faint dry crackles on the left.  No wheezes or rhonchi. CARDIOVASCULAR: Regular rate and rhythm without murmur, rub or gallop. ABDOMEN: Soft, non-tender, with active bowel sounds, and no hepatosplenomegaly. No masses. SKIN: No rashes, ulcers or lesions. EXTREMITIES: No edema, no skin discoloration or tenderness. No palpable cords. LYMPH NODES: No palpable cervical, supraclavicular, axillary or inguinal adenopathy  NEUROLOGICAL: Unremarkable. PSYCH: Appropriate.  No visits with results within 3 Day(s) from this visit. Latest known visit with results is:  Orders Only on 07/11/2015  Component Date Value Ref Range Status  . Free T4 07/11/2015 0.58* 0.61 - 1.12 ng/dL Final    Assessment:  Audrey Mcgee is a 54 y.o. female with metastatic poorly differentiated adenocarcinoma of lung. She presented with a 5 month history of enlarging bilateral neck masses, progressive hoarseness, dysphagia, dyspnea on exertion, cough, left sided hearing loss, neck pain, and a 30 pound weight loss.  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 Biopsy of the  right supraclavicular lymph node on 10/03/2014 revealed poorly differentiated non-small cell carcinoma, favoring adenocarcinoma consistent with lung primary (TTF-1 positive). KRAS was positive.   PET scan on 10/24/2014 revealed a left hilar mass with associated left upper lobe obstruction/atelectasis with extensive bilateral mediastinal and cervical adnopathy. Chest CT from 10/10/2014 also noted a 2.2 x 0.9 cm soft tissue density with internal calcifications in the right breast. Head MRI revealed no evidence of metastatic disease.  She received palliative radiation (3000 cGy) to the left lung from 10/24/2014 - 11/07/2014. She has received 3 cycles of carboplatin and Alimta (11/14/2014, 12/05/2014, and 02/13/2015). Last B12 was given on 04/09/2015.   She has pain in her left chest, throat, and shoulder. She is on oxycodone 10 mg every 6 hours as needed. Morphine causes nausea. She has chronic shortness of breath with exertion.  She received 6 cycles carboplatin and Alimta (11/14/2014 - 04/30/2015). There was a delay between cycle #2 and cycle #3 secondary to a switch in caregivers. She last received B12 on 04/09/2015.  She was diagnosed with a left peri-hilar infiltrate on 02/23/2015. She has completed a course of Levaquin. Chest CT on 03/21/2015 revealed LUL scarring, patchy ill defined airspace opacities in  the lingula, upper esophageal thickening, and small mediastinal and hilar nodes. She completed a course of Azithromycin.  She is status post 2 cycles of maintenance Alimta (05/22/2015 - 06/15/2015). Chest CT on 07/09/2015 revealed patchy ground glass and nodular consolidation in the left upper lobe and to a lesser extent left lower lobe, minimally progressive from 03/21/2015.  Bone scan on 07/09/2015 revealed no evidence of metastatic disease.  Symptomatically, she remains short of breath.  She foamy cough.  She denies any fever or chills. Exam is stable.  Plan: 1.  Labs today: CBC  with diff, CMP, CEA. 2.  Discuss patient's concerns about bronchoscopy.  Discuss unclear process in lung (infection or progressive disease).  Discuss reimaging to provide additional data for disease assessment or guided bronchoscopy with sampling. 3.  Refill oxycodone and magic mouthwash. 4.  Chest CT scan.. 5.  RTC after CT scan to discuss direction of therapy.   Lequita Asal, MD  08/31/2015

## 2015-08-31 NOTE — Progress Notes (Signed)
Protocol Name:  Ralph Leyden [id number]:  Patient and oncologist decision making related to maintenance treatment for Advanced Non Small Cell Lung Cancer in the [place]:  Patient survey. 10:15 AM:  After discussing with Dr. Mike Gip and receiving her recommendation to meet with patient, I met with patient (no other family/friends present) in physician office to discuss survey study.  Patient was informed that the study entails her completing a written survey about her cancer experience but more specifically about her decision to continue treatment for NSCLC with maintenance therapy.  For completion of the survey, patient will be given $100 gift card issued from [employer].  Consent form reviewed with patient, including risks and benefits of study participation.  She understands participation is voluntary.  Patient had no additional questions regarding the study.  Patient agreed to participate.  Patient signed consent form for study and [place] for use/disclosure of protected health information (HIPPA form).  Signed copy of consent form and HIPPA form given to patient.  Patient was given survey and she completed during her office visit.  Patient was given $100 gift card provided by [employer] as compensation for her study involvement.  I spent approximately 15 minutes with patient obtaining consent and 10 minutes with patient delivering copy of signed documents and collecting completed survey and issuing gift card.

## 2015-09-01 LAB — CEA: CEA: 8.6 ng/mL — ABNORMAL HIGH (ref 0.0–4.7)

## 2015-09-03 ENCOUNTER — Ambulatory Visit: Payer: Medicaid Other | Admitting: Hematology and Oncology

## 2015-09-03 ENCOUNTER — Encounter: Payer: Self-pay | Admitting: Hematology and Oncology

## 2015-09-03 ENCOUNTER — Ambulatory Visit: Payer: Medicaid Other

## 2015-09-04 ENCOUNTER — Ambulatory Visit: Admission: RE | Admit: 2015-09-04 | Payer: Medicaid Other | Source: Ambulatory Visit

## 2015-09-04 NOTE — Addendum Note (Signed)
Addended by: Betti Cruz on: 09/04/2015 11:22 AM   Modules accepted: Medications

## 2015-09-06 ENCOUNTER — Inpatient Hospital Stay: Payer: Medicaid Other | Admitting: Hematology and Oncology

## 2015-09-14 ENCOUNTER — Telehealth: Payer: Self-pay

## 2015-09-14 ENCOUNTER — Other Ambulatory Visit: Payer: Self-pay

## 2015-09-14 DIAGNOSIS — C3490 Malignant neoplasm of unspecified part of unspecified bronchus or lung: Secondary | ICD-10-CM

## 2015-09-14 MED ORDER — MEGESTROL ACETATE 40 MG/ML PO SUSP: 200.0000 mg | Freq: Two times a day (BID) | ORAL | Status: DC

## 2015-09-14 NOTE — Telephone Encounter (Signed)
Called pt to inform pt that megace refill has been called in.  Pt verbalized an understanding.

## 2015-09-20 ENCOUNTER — Ambulatory Visit: Admission: RE | Admit: 2015-09-20 | Payer: Medicaid Other | Source: Ambulatory Visit

## 2015-09-24 ENCOUNTER — Inpatient Hospital Stay: Payer: Medicaid Other | Attending: Hematology and Oncology | Admitting: Hematology and Oncology

## 2015-09-24 ENCOUNTER — Ambulatory Visit
Admission: RE | Admit: 2015-09-24 | Discharge: 2015-09-24 | Disposition: A | Discharge status: Home / Self Care | Payer: Medicaid Other | Source: Ambulatory Visit | Attending: Hematology and Oncology | Admitting: Hematology and Oncology

## 2015-09-24 ENCOUNTER — Other Ambulatory Visit: Payer: Self-pay

## 2015-09-24 VITALS — BP 135/94 | HR 144 | Temp 98.7°F | Resp 20 | Wt 119.4 lb

## 2015-09-24 DIAGNOSIS — C3412 Malignant neoplasm of upper lobe, left bronchus or lung: Secondary | ICD-10-CM | POA: Diagnosis not present

## 2015-09-24 DIAGNOSIS — R05 Cough: Secondary | ICD-10-CM

## 2015-09-24 DIAGNOSIS — C3490 Malignant neoplasm of unspecified part of unspecified bronchus or lung: Secondary | ICD-10-CM

## 2015-09-24 DIAGNOSIS — Z85118 Personal history of other malignant neoplasm of bronchus and lung: Secondary | ICD-10-CM | POA: Diagnosis not present

## 2015-09-24 DIAGNOSIS — G893 Neoplasm related pain (acute) (chronic): Secondary | ICD-10-CM

## 2015-09-24 DIAGNOSIS — Z8701 Personal history of pneumonia (recurrent): Secondary | ICD-10-CM | POA: Diagnosis not present

## 2015-09-24 DIAGNOSIS — Z79899 Other long term (current) drug therapy: Secondary | ICD-10-CM | POA: Diagnosis not present

## 2015-09-24 DIAGNOSIS — Z87891 Personal history of nicotine dependence: Secondary | ICD-10-CM | POA: Diagnosis not present

## 2015-09-24 DIAGNOSIS — Z923 Personal history of irradiation: Secondary | ICD-10-CM | POA: Insufficient documentation

## 2015-09-24 DIAGNOSIS — R42 Dizziness and giddiness: Secondary | ICD-10-CM | POA: Insufficient documentation

## 2015-09-24 DIAGNOSIS — Z9221 Personal history of antineoplastic chemotherapy: Secondary | ICD-10-CM | POA: Diagnosis not present

## 2015-09-24 DIAGNOSIS — R530 Neoplastic (malignant) related fatigue: Secondary | ICD-10-CM

## 2015-09-24 DIAGNOSIS — R059 Cough, unspecified: Secondary | ICD-10-CM

## 2015-09-24 DIAGNOSIS — Z809 Family history of malignant neoplasm, unspecified: Secondary | ICD-10-CM | POA: Insufficient documentation

## 2015-09-24 DIAGNOSIS — R634 Abnormal weight loss: Secondary | ICD-10-CM

## 2015-09-24 DIAGNOSIS — I10 Essential (primary) hypertension: Secondary | ICD-10-CM | POA: Insufficient documentation

## 2015-09-24 MED ORDER — AZITHROMYCIN 250 MG PO TABS: ORAL_TABLET | ORAL | Status: DC

## 2015-09-24 MED ORDER — BENZONATATE 100 MG PO CAPS: 100.0000 mg | ORAL_CAPSULE | Freq: Three times a day (TID) | ORAL | Status: DC | PRN

## 2015-09-24 MED ORDER — OXYCODONE HCL 10 MG PO TABS: 10.0000 mg | ORAL_TABLET | ORAL | Status: DC | PRN

## 2015-09-24 NOTE — Progress Notes (Signed)
[facility] day:  09/24/2015   Chief Complaint: Audrey Mcgee is a 55 y.o. female with metastatic poorly differentiated non-small cell lung cancer who is seen for 1 month assessment.  HPI: The patient was last seen in the medical oncology clinic on 08/31/2015.  At that time, she remained short of breath.  She had a foamy cough.  She denied any fever or chills.  We rediscussed her chest CT from 07/09/2015 and her concern for bronchoscopy.  A follow-up chest CT was ordered. She has not rescheduled her scan.  She states that 3 weeks ago she woke up with an itchy throat. The next day she felt light headed.  She was seen in the [facility] clinic. Temperature was 102 for 3 days. Sputum was yellow at first. She took Alka-Seltzer cold. She has not rescheduled her chest CT.  Symptomatically, she is getting better. Her fevers is gone. Her nasal secretions are clear. She denies any hemoptysis. She is a little bit of a cough. She does feel run down and is sleeping more.  Past Medical History  Diagnosis Date  . Hypertension   . Lung cancer     Past Surgical History  Procedure Laterality Date  . Stomach surgery      Pt reports for a tumor    Family History  Problem Relation Age of Onset  . Hypertension Father   . Cancer Father   . Cancer Maternal Aunt     Social History:  reports that she has been smoking Cigarettes.  She has smoked for the past 15 years. She has never used smokeless tobacco. She reports that she drinks about 1.2 oz of alcohol per week. She reports that she does not use illicit drugs.  She states that her nephew shot in [place] (caused delays in her treatment).  Her boyfriend's father recently died of metastatic lung cancer.  Her boyfriend works at a school with children.  The patient is alone today.    Allergies:  Allergies  Allergen Reactions  . No Known Allergies     Current Medications: Current Outpatient  Prescriptions  Medication Sig Dispense Refill  . dexamethasone (DECADRON) 4 MG tablet Take 1 tablet (4 mg total) by mouth 2 (two) times daily. Take on the day before and after chemotherapy 16 tablet 0  . DIPHENHYD-LIDOCAINE-NYSTATIN MT Use as directed in the mouth or throat.    . docusate sodium (COLACE) 100 MG capsule Take 100 mg by mouth 2 (two) times daily as needed for mild constipation.    . folic acid (FOLVITE) 269 MCG tablet Take 400 mcg by mouth daily.    [name] lidocaine (XYLOCAINE) 2 % solution use 10 milliliters by mouth every 4 hours if needed  0  . losartan (COZAAR) 100 MG tablet Take 1 tablet (100 mg total) by mouth daily. 30 tablet 0  . magic mouthwash w/lidocaine SOLN Take 5 mLs by mouth 4 (four) times daily as needed for mouth pain. 480 mL 1  . megestrol (MEGACE) 40 MG/ML suspension Take 5 mLs (200 mg total) by mouth 2 (two) times daily. 240 mL 0  . morphine (MS CONTIN) 15 MG 12 hr tablet Take 1 tablet (15 mg total) by mouth 2 (two) times daily. 60 tablet 0  . omeprazole (PRILOSEC) 20 MG capsule Take 1 capsule (20 mg total) by mouth daily. 30 capsule 3  . ondansetron (ZOFRAN) 8 MG tablet Take 1 tablet (8 mg total) by mouth 2 (  two) times daily as needed for nausea or vomiting. 20 tablet 2  . Oxycodone HCl 10 MG TABS Take 1 tablet (10 mg total) by mouth every 4 (four) hours as needed. 90 tablet 0   No current facility-administered medications for this visit.    Review of Systems:  GENERAL: Feels tired. No fevers, sweats or weight loss. Weight up 5 pounds. PERFORMANCE STATUS (ECOG): 1 HEENT: Runny nose, resolved.  No sore throat, mouth sores or tenderness. Lungs:  Chronic shortness of breath.  Productive cough, improved.  No hemoptysis. Cardiac: No chest pain, palpitations, orthopnea, or PND. GI: No nausea, vomiting, diarrhea, constipation, melena or hematochezia. GU: No urgency, frequency, dysuria, or hematuria. Musculoskeletal: Pain under left arm and chest (chronic).   No back pain. No joint pain. No muscle tenderness. Extremities: No pain or swelling. Skin: No rashes or skin changes. Neuro: No headache, numbness or weakness, balance or coordination issues. Endocrine: No diabetes, thyroid issues, hot flashes or night sweats. Psych: No mood changes, depression or anxiety. Pain: Pain in shoulder (stable). Review of systems: All other systems reviewed and found to be negative.  Physical Exam: Blood pressure 135/94, pulse 144, temperature 98.7 F (37.1 C), temperature source Tympanic, resp. rate 20, weight 119 lb 5.7 oz (54.14 kg).  GENERAL: Thin woman sitting comfortably in the exam room in no acute distress. MENTAL STATUS: Alert and oriented to person, place and time. HEAD: Styled short brown wig with highlights. Normocephalic, atraumatic, face symmetric, no Cushingoid features. EYES: Brown eyes. Pupils equal round and reactive to light and accomodation. No conjunctivitis or scleral icterus. ENT: Oropharynx clear without lesion. Tongue normal. Mucous membranes moist.  RESPIRATORY: Intermittent cough.  No rales, wheezes or rhonchi. CARDIOVASCULAR: Regular rate and rhythm without murmur, rub or gallop. ABDOMEN: Soft, non-tender, with active bowel sounds, and no hepatosplenomegaly. No masses. SKIN: No rashes, ulcers or lesions. EXTREMITIES: No edema, no skin discoloration or tenderness. No palpable cords. LYMPH NODES: No palpable cervical, supraclavicular, axillary or inguinal adenopathy  NEUROLOGICAL: Unremarkable. PSYCH: Appropriate.  No visits with results within 3 Day(s) from this visit. Latest known visit with results is:  Appointment on 08/31/2015  Component Date Value Ref Range Status  . WBC 08/31/2015 6.1  3.6 - 11.0 K/uL Final  . RBC 08/31/2015 4.61  3.80 - 5.20 MIL/uL Final  . Hemoglobin 08/31/2015 16.0  12.0 - 16.0 g/dL Final  . HCT 08/31/2015 46.7  35.0 - 47.0 % Final  . MCV 08/31/2015 101.4* 80.0 - 100.0 fL  Final  . MCH 08/31/2015 34.8* 26.0 - 34.0 pg Final  . MCHC 08/31/2015 34.3  32.0 - 36.0 g/dL Final  . RDW 08/31/2015 12.8  11.5 - 14.5 % Final  . Platelets 08/31/2015 238  150 - 440 K/uL Final  . Neutrophils Relative % 08/31/2015 62   Final  . Neutro Abs 08/31/2015 3.8  1.4 - 6.5 K/uL Final  . Lymphocytes Relative 08/31/2015 27   Final  . Lymphs Abs 08/31/2015 1.7  1.0 - 3.6 K/uL Final  . Monocytes Relative 08/31/2015 10   Final  . Monocytes Absolute 08/31/2015 0.6  0.2 - 0.9 K/uL Final  . Eosinophils Relative 08/31/2015 0   Final  . Eosinophils Absolute 08/31/2015 0.0  0 - 0.7 K/uL Final  . Basophils Relative 08/31/2015 1   Final  . Basophils Absolute 08/31/2015 0.0  0 - 0.1 K/uL Final  . Sodium 08/31/2015 136  135 - 145 mmol/L Final  . Potassium 08/31/2015 3.6  3.5 - 5.1 mmol/L  Final  . Chloride 08/31/2015 103  101 - 111 mmol/L Final  . CO2 08/31/2015 25  22 - 32 mmol/L Final  . Glucose, Bld 08/31/2015 74  65 - 99 mg/dL Final  . BUN 08/31/2015 8  6 - 20 mg/dL Final  . Creatinine, Ser 08/31/2015 0.61  0.44 - 1.00 mg/dL Final  . Calcium 08/31/2015 9.4  8.9 - 10.3 mg/dL Final  . Total Protein 08/31/2015 8.4* 6.5 - 8.1 g/dL Final  . Albumin 08/31/2015 4.6  3.5 - 5.0 g/dL Final  . AST 08/31/2015 22  15 - 41 U/L Final  . ALT 08/31/2015 9* 14 - 54 U/L Final  . Alkaline Phosphatase 08/31/2015 55  38 - 126 U/L Final  . Total Bilirubin 08/31/2015 0.8  0.3 - 1.2 mg/dL Final  . GFR calc non Af Amer 08/31/2015 >60  >60 mL/min Final  . GFR calc Af Amer 08/31/2015 >60  >60 mL/min Final   Comment: (NOTE) The eGFR has been calculated using the CKD EPI equation. This calculation has not been validated in all clinical situations. eGFR's persistently <60 mL/min signify possible Chronic Kidney Disease.   . Anion gap 08/31/2015 8  5 - 15 Final  . CEA 08/31/2015 8.6* 0.0 - 4.7 ng/mL Final   Comment: (NOTE)       Roche ECLIA methodology       Nonsmokers  <3.9                                      Smokers     <5.6 Performed At: [facility] [address] [place], [place] [id number] Lindon Romp MD FV:[id number]     Assessment:  Audrey Mcgee is a 54 y.o. female with metastatic poorly differentiated adenocarcinoma of lung. She presented with a 5 month history of enlarging bilateral neck masses, progressive hoarseness, dysphagia, dyspnea on exertion, cough, left sided hearing loss, neck pain, and a 30 pound weight loss.  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 Biopsy of the right supraclavicular lymph node on 10/03/2014 revealed poorly differentiated non-small cell carcinoma, favoring adenocarcinoma consistent with lung primary (TTF-1 positive). KRAS was positive.   PET scan on 10/24/2014 revealed a left hilar mass with associated left upper lobe obstruction/atelectasis with extensive bilateral mediastinal and cervical adnopathy. Chest CT from 10/10/2014 also noted a 2.2 x 0.9 cm soft tissue density with internal calcifications in the right breast. Head MRI revealed no evidence of metastatic disease.  She received palliative radiation (3000 cGy) to the left lung from 10/24/2014 - 11/07/2014. She has received 3 cycles of carboplatin and Alimta (11/14/2014, 12/05/2014, and 02/13/2015). Last B12 was given on 04/09/2015.   She has pain in her left chest, throat, and shoulder. She is on oxycodone 10 mg every 6 hours as needed. Morphine causes nausea. She has chronic shortness of breath with exertion.  She received 6 cycles carboplatin and Alimta (11/14/2014 - 04/30/2015). There was a delay between cycle #2 and cycle #3 secondary to a switch in caregivers. She last received B12 on 04/09/2015.  She was diagnosed with a left peri-hilar infiltrate on 02/23/2015. She has completed a course of Levaquin. Chest CT on 03/21/2015 revealed LUL scarring, patchy ill defined airspace opacities in the lingula, upper esophageal  thickening, and small mediastinal and hilar nodes. She completed a course of Azithromycin.  She is status post 2 cycles of maintenance Alimta (  05/22/2015 - 06/15/2015). Chest CT on 07/09/2015 revealed patchy ground glass and nodular consolidation in the left upper lobe and to a lesser extent left lower lobe, minimally progressive from 03/21/2015.  Bone scan on 07/09/2015 revealed no evidence of metastatic disease.  Symptomatically, she appears to have improved after an episode of bronchitis/pneumonia.  She denies any fever or chills. She is fatigued.  Exam is stable.  Plan: 1.  Discuss need to reschedule chest CT and follow-up for bronchoscopy secondary to imaging studies of 07/09/2015. 2.  CXR (PA and lateral)- cough and recent fever. 3.  RTC after CXR for review with patient. 4.  Rx:  Oxycodone and Tessalon Perles. 5.  Rx:  Z-pack (500 mg day 1 then 250 mg day 2-5). 6.  RTC in 2 weeks for MD assessment and review of rescheduled chest CT.  Addendum:  CXR revealed patchy left perihilar airspace opacities, similar to prior CT which may be post treatment changes or infectious.   Lequita Asal, MD  09/24/2015 , 11:51 AM

## 2015-09-24 NOTE — Progress Notes (Signed)
Pt having increased SOB, having cold like symptoms including a productive cough with clear sputum now, had a fever week of 9/20 of 102 about every day.  Reports being tired related to cold and some nausea also.

## 2015-09-25 ENCOUNTER — Other Ambulatory Visit: Payer: Self-pay

## 2015-10-07 ENCOUNTER — Encounter: Payer: Self-pay | Admitting: Hematology and Oncology

## 2015-10-08 ENCOUNTER — Other Ambulatory Visit: Payer: Self-pay | Admitting: Hematology and Oncology

## 2015-10-08 ENCOUNTER — Other Ambulatory Visit: Payer: Self-pay

## 2015-10-08 ENCOUNTER — Inpatient Hospital Stay (HOSPITAL_BASED_OUTPATIENT_CLINIC_OR_DEPARTMENT_OTHER): Payer: Medicaid Other | Admitting: Hematology and Oncology

## 2015-10-08 ENCOUNTER — Encounter: Payer: Self-pay | Admitting: Hematology and Oncology

## 2015-10-08 VITALS — BP 127/81 | HR 111 | Temp 97.0°F | Resp 18 | Ht 62.0 in | Wt 116.4 lb

## 2015-10-08 DIAGNOSIS — Z79899 Other long term (current) drug therapy: Secondary | ICD-10-CM

## 2015-10-08 DIAGNOSIS — Z8701 Personal history of pneumonia (recurrent): Secondary | ICD-10-CM

## 2015-10-08 DIAGNOSIS — Z9221 Personal history of antineoplastic chemotherapy: Secondary | ICD-10-CM

## 2015-10-08 DIAGNOSIS — Z87891 Personal history of nicotine dependence: Secondary | ICD-10-CM

## 2015-10-08 DIAGNOSIS — C3490 Malignant neoplasm of unspecified part of unspecified bronchus or lung: Secondary | ICD-10-CM

## 2015-10-08 DIAGNOSIS — C3412 Malignant neoplasm of upper lobe, left bronchus or lung: Secondary | ICD-10-CM | POA: Diagnosis not present

## 2015-10-08 DIAGNOSIS — I10 Essential (primary) hypertension: Secondary | ICD-10-CM

## 2015-10-08 DIAGNOSIS — Z923 Personal history of irradiation: Secondary | ICD-10-CM

## 2015-10-08 DIAGNOSIS — Z809 Family history of malignant neoplasm, unspecified: Secondary | ICD-10-CM

## 2015-10-08 DIAGNOSIS — R634 Abnormal weight loss: Secondary | ICD-10-CM

## 2015-10-08 DIAGNOSIS — R42 Dizziness and giddiness: Secondary | ICD-10-CM

## 2015-10-08 DIAGNOSIS — R05 Cough: Secondary | ICD-10-CM

## 2015-10-08 MED ORDER — MEGESTROL ACETATE 40 MG/ML PO SUSP: 200.0000 mg | Freq: Two times a day (BID) | ORAL | Status: DC

## 2015-10-08 MED ORDER — OMEPRAZOLE 20 MG PO CPDR: 20.0000 mg | DELAYED_RELEASE_CAPSULE | Freq: Every day | ORAL | Status: DC

## 2015-10-08 NOTE — Progress Notes (Signed)
[facility] day:  10/08/2015   Chief Complaint: Audrey Mcgee is a 54 y.o. female with metastatic poorly differentiated non-small cell lung cancer who is seen for 1 month assessment.  HPI: The patient was last seen in the medical oncology clinic on 09/24/2015.  At that time, she had not followed up with her chest CT and evaluation by pulmonary medicine for possible bronchoscopy.  She appeared to have improved after an episode of bronchitis/pneumonia.  She denied any fever or chills. She was fatigued. CXR revealed patchy left perihilar airspace opacities, similar to prior CT.  She was given a prescription for a Z-pack.  She was to reschedule her chest CT.  During the interim, she has felt better.    She completed her antibiotic.  She notes "no more cold in my chest".  She has had no fever.  She feels "a little weak, but ok".   Past Medical History  Diagnosis Date  . Hypertension   . Lung cancer [facility])     Past Surgical History  Procedure Laterality Date  . Stomach surgery      Pt reports for a tumor    Family History  Problem Relation Age of Onset  . Hypertension Father   . Cancer Father   . Cancer Maternal Aunt     Social History:  reports that she has been smoking Cigarettes.  She has smoked for the past 15 years. She has never used smokeless tobacco. She reports that she drinks about 1.2 oz of alcohol per week. She reports that she does not use illicit drugs.  She states that her nephew shot in [place] (caused delays in her treatment).  Her boyfriend's father recently died of metastatic lung cancer.  Her boyfriend works at a school with children.  The patient is alone today.    Allergies:  Allergies  Allergen Reactions  . No Known Allergies     Current Medications: Current Outpatient Prescriptions  Medication Sig Dispense Refill  . benzonatate (TESSALON) 100 MG capsule Take 1 capsule (100 mg total) by mouth 3 (three)  times daily as needed for cough. 20 capsule 0  . DIPHENHYD-LIDOCAINE-NYSTATIN MT Use as directed in the mouth or throat.    . docusate sodium (COLACE) 100 MG capsule Take 100 mg by mouth 2 (two) times daily as needed for mild constipation.    . folic acid (FOLVITE) 836 MCG tablet Take 400 mcg by mouth daily.    [name] lidocaine (XYLOCAINE) 2 % solution use 10 milliliters by mouth every 4 hours if needed  0  . losartan (COZAAR) 100 MG tablet Take 1 tablet (100 mg total) by mouth daily. 30 tablet 0  . magic mouthwash w/lidocaine SOLN Take 5 mLs by mouth 4 (four) times daily as needed for mouth pain. 480 mL 1  . morphine (MS CONTIN) 15 MG 12 hr tablet Take 1 tablet (15 mg total) by mouth 2 (two) times daily. 60 tablet 0  . ondansetron (ZOFRAN) 8 MG tablet Take 1 tablet (8 mg total) by mouth 2 (two) times daily as needed for nausea or vomiting. 20 tablet 2  . Oxycodone HCl 10 MG TABS Take 1 tablet (10 mg total) by mouth every 4 (four) hours as needed (pain). 60 tablet 0  . azithromycin (ZITHROMAX Z-PAK) 250 MG tablet Take 500 mg by mouth on day 1 followed by 250 mg once daily by mouth on days 2 to 5 (Patient not taking: Reported on  10/08/2015) 6 each 0  . dexamethasone (DECADRON) 4 MG tablet Take 1 tablet (4 mg total) by mouth 2 (two) times daily. Take on the day before and after chemotherapy (Patient not taking: Reported on 10/08/2015) 16 tablet 0  . megestrol (MEGACE) 40 MG/ML suspension Take 5 mLs (200 mg total) by mouth 2 (two) times daily. 240 mL 0  . omeprazole (PRILOSEC) 20 MG capsule Take 1 capsule (20 mg total) by mouth daily. 30 capsule 3   No current facility-administered medications for this visit.    Review of Systems:  GENERAL: Feels weak, but ok. No fevers, sweats or weight loss. Weight down 3 pounds. PERFORMANCE STATUS (ECOG): 1 HEENT: Runny nose, resolved.  No sore throat, mouth sores or tenderness. Lungs:  Chronic shortness of breath. Nonproductive cough.  No  hemoptysis. Cardiac: No chest pain, palpitations, orthopnea, or PND. GI: No nausea, vomiting, diarrhea, constipation, melena or hematochezia. GU: No urgency, frequency, dysuria, or hematuria. Musculoskeletal: Pain under left arm and chest (chronic).  No back pain. No joint pain. No muscle tenderness. Extremities: No pain or swelling. Skin: No rashes or skin changes. Neuro: No headache, numbness or weakness, balance or coordination issues. Endocrine: No diabetes, thyroid issues, hot flashes or night sweats. Psych: No mood changes, depression or anxiety. Pain: Pain in shoulder (stable). Review of systems: All other systems reviewed and found to be negative.  Physical Exam: Blood pressure 127/81, pulse 111, temperature 97 F (36.1 C), temperature source Tympanic, resp. rate 18, height 5' 2"  (1.575 m), weight 116 lb 6.5 oz (52.8 kg), SpO2 100 %.  GENERAL: Thin woman sitting comfortably in the exam room in no acute distress.  She smells of smoke. MENTAL STATUS: Alert and oriented to person, place and time. HEAD: Styled short brown wig with highlights. Normocephalic, atraumatic, face symmetric, no Cushingoid features. EYES: Brown eyes. Ruddy sclera.  Pupils equal round and reactive to light and accomodation. No conjunctivitis or scleral icterus. ENT: Oropharynx clear without lesion. Tongue normal. Mucous membranes moist.  RESPIRATORY: Decreased respiratory excursion.  No rales, wheezes or rhonchi. CARDIOVASCULAR: Regular rate and rhythm without murmur, rub or gallop. ABDOMEN: Soft, non-tender, with active bowel sounds, and no hepatosplenomegaly. No masses. SKIN: No rashes, ulcers or lesions. EXTREMITIES: No edema, no skin discoloration or tenderness. No palpable cords. LYMPH NODES: No palpable cervical, supraclavicular, axillary or inguinal adenopathy  NEUROLOGICAL: Unremarkable. PSYCH: Appropriate.  No visits with results within 3 Day(s) from this  visit. Latest known visit with results is:  Appointment on 08/31/2015  Component Date Value Ref Range Status  . WBC 08/31/2015 6.1  3.6 - 11.0 K/uL Final  . RBC 08/31/2015 4.61  3.80 - 5.20 MIL/uL Final  . Hemoglobin 08/31/2015 16.0  12.0 - 16.0 g/dL Final  . HCT 08/31/2015 46.7  35.0 - 47.0 % Final  . MCV 08/31/2015 101.4* 80.0 - 100.0 fL Final  . MCH 08/31/2015 34.8* 26.0 - 34.0 pg Final  . MCHC 08/31/2015 34.3  32.0 - 36.0 g/dL Final  . RDW 08/31/2015 12.8  11.5 - 14.5 % Final  . Platelets 08/31/2015 238  150 - 440 K/uL Final  . Neutrophils Relative % 08/31/2015 62   Final  . Neutro Abs 08/31/2015 3.8  1.4 - 6.5 K/uL Final  . Lymphocytes Relative 08/31/2015 27   Final  . Lymphs Abs 08/31/2015 1.7  1.0 - 3.6 K/uL Final  . Monocytes Relative 08/31/2015 10   Final  . Monocytes Absolute 08/31/2015 0.6  0.2 - 0.9 K/uL Final  .  Eosinophils Relative 08/31/2015 0   Final  . Eosinophils Absolute 08/31/2015 0.0  0 - 0.7 K/uL Final  . Basophils Relative 08/31/2015 1   Final  . Basophils Absolute 08/31/2015 0.0  0 - 0.1 K/uL Final  . Sodium 08/31/2015 136  135 - 145 mmol/L Final  . Potassium 08/31/2015 3.6  3.5 - 5.1 mmol/L Final  . Chloride 08/31/2015 103  101 - 111 mmol/L Final  . CO2 08/31/2015 25  22 - 32 mmol/L Final  . Glucose, Bld 08/31/2015 74  65 - 99 mg/dL Final  . BUN 08/31/2015 8  6 - 20 mg/dL Final  . Creatinine, Ser 08/31/2015 0.61  0.44 - 1.00 mg/dL Final  . Calcium 08/31/2015 9.4  8.9 - 10.3 mg/dL Final  . Total Protein 08/31/2015 8.4* 6.5 - 8.1 g/dL Final  . Albumin 08/31/2015 4.6  3.5 - 5.0 g/dL Final  . AST 08/31/2015 22  15 - 41 U/L Final  . ALT 08/31/2015 9* 14 - 54 U/L Final  . Alkaline Phosphatase 08/31/2015 55  38 - 126 U/L Final  . Total Bilirubin 08/31/2015 0.8  0.3 - 1.2 mg/dL Final  . GFR calc non Af Amer 08/31/2015 >60  >60 mL/min Final  . GFR calc Af Amer 08/31/2015 >60  >60 mL/min Final   Comment: (NOTE) The eGFR has been calculated using the CKD EPI  equation. This calculation has not been validated in all clinical situations. eGFR's persistently <60 mL/min signify possible Chronic Kidney Disease.   . Anion gap 08/31/2015 8  5 - 15 Final  . CEA 08/31/2015 8.6* 0.0 - 4.7 ng/mL Final   Comment: (NOTE)       Roche ECLIA methodology       Nonsmokers  <3.9                                     Smokers     <5.6 Performed At: [facility] [address] [place], [place] [id number] Lindon Romp MD VV:[id number]     Assessment:  Audrey Mcgee is a 54 y.o. female with metastatic poorly differentiated adenocarcinoma of lung. She presented with a 5 month history of enlarging bilateral neck masses, progressive hoarseness, dysphagia, dyspnea on exertion, cough, left sided hearing loss, neck pain, and a 30 pound weight loss.  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 Biopsy of the right supraclavicular lymph node on 10/03/2014 revealed poorly differentiated non-small cell carcinoma, favoring adenocarcinoma consistent with lung primary (TTF-1 positive). KRAS was positive.   PET scan on 10/24/2014 revealed a left hilar mass with associated left upper lobe obstruction/atelectasis with extensive bilateral mediastinal and cervical adnopathy. Chest CT from 10/10/2014 also noted a 2.2 x 0.9 cm soft tissue density with internal calcifications in the right breast. Head MRI revealed no evidence of metastatic disease.  She received palliative radiation (3000 cGy) to the left lung from 10/24/2014 - 11/07/2014. She has received 3 cycles of carboplatin and Alimta (11/14/2014, 12/05/2014, and 02/13/2015). Last B12 was given on 04/09/2015.   She has pain in her left chest, throat, and shoulder. She is on oxycodone 10 mg every 6 hours as needed. Morphine causes nausea. She has chronic shortness of breath with exertion.  She received 6 cycles carboplatin and Alimta (11/14/2014 - 04/30/2015). There  was a delay between cycle #2 and cycle #3 secondary to a switch in caregivers. She last received  B12 on 04/09/2015.  She was diagnosed with a left peri-hilar infiltrate on 02/23/2015. She has completed a course of Levaquin. Chest CT on 03/21/2015 revealed LUL scarring, patchy ill defined airspace opacities in the lingula, upper esophageal thickening, and small mediastinal and hilar nodes. She completed a course of Azithromycin.  She is status post 2 cycles of maintenance Alimta (05/22/2015 - 06/15/2015). Chest CT on 07/09/2015 revealed patchy ground glass and nodular consolidation in the left upper lobe and to a lesser extent left lower lobe, minimally progressive from 03/21/2015.  Bone scan on 07/09/2015 revealed no evidence of metastatic disease.  CXR on 09/24/2015 revealed patchy left perihilar airspace opacities.  She was treated with azithromycin.  Symptomatically, she is fatigued.  Exam reveals decreased respiratory excursion.  She is smoking 2 cigarettes a week.  Plan: 1.  Reschedule chest CT. 2.  Schedule follow-up with pulmonary medicine. 3.  Discuss likely plan for bronchoscopy.  Patient agrees to follow through. 4.  Rx:  Megace and ompraazole. 5.  Encourage smoking cessation. 6.  RTC after chest CT and pulmonary evaluation.   Lequita Asal, MD  10/08/2015 , 9:48 AM

## 2015-10-08 NOTE — Progress Notes (Signed)
Patient is here for two week follow-up of SCLC and chest x-ray. Patient states that overall she has been doing pretty good. She does have SOB with exertion. Her Pulse Ox in the office today is 100% on room air. She still has some trouble with swallowing and is taking her medications for this. Her appetite has been good.

## 2015-10-16 ENCOUNTER — Other Ambulatory Visit: Payer: Self-pay | Admitting: *Deleted

## 2015-10-16 DIAGNOSIS — C3490 Malignant neoplasm of unspecified part of unspecified bronchus or lung: Secondary | ICD-10-CM

## 2015-10-16 MED ORDER — OXYCODONE HCL 10 MG PO TABS: 10.0000 mg | ORAL_TABLET | ORAL | Status: DC | PRN

## 2015-10-17 ENCOUNTER — Ambulatory Visit
Admission: RE | Admit: 2015-10-17 | Discharge: 2015-10-17 | Disposition: A | Discharge status: Home / Self Care | Payer: Medicaid Other | Source: Ambulatory Visit | Attending: Hematology and Oncology | Admitting: Hematology and Oncology

## 2015-10-17 DIAGNOSIS — K229 Disease of esophagus, unspecified: Secondary | ICD-10-CM | POA: Diagnosis not present

## 2015-10-17 DIAGNOSIS — C3490 Malignant neoplasm of unspecified part of unspecified bronchus or lung: Secondary | ICD-10-CM | POA: Insufficient documentation

## 2015-10-17 DIAGNOSIS — Z08 Encounter for follow-up examination after completed treatment for malignant neoplasm: Secondary | ICD-10-CM | POA: Insufficient documentation

## 2015-10-17 LAB — POCT I-STAT CREATININE: Creatinine, Ser: 0.7 mg/dL (ref 0.44–1.00)

## 2015-10-17 MED ORDER — IOHEXOL 300 MG/ML  SOLN
75.0000 mL | Freq: Once | INTRAMUSCULAR | Status: AC | PRN
  Administered 2015-10-17: 75 mL via INTRAVENOUS

## 2015-10-23 ENCOUNTER — Ambulatory Visit: Payer: Medicaid Other | Admitting: Internal Medicine

## 2015-10-23 ENCOUNTER — Encounter: Payer: Self-pay | Admitting: *Deleted

## 2015-10-30 ENCOUNTER — Ambulatory Visit: Payer: Medicaid Other | Admitting: Hematology and Oncology

## 2015-11-01 ENCOUNTER — Other Ambulatory Visit: Payer: Self-pay | Admitting: *Deleted

## 2015-11-01 ENCOUNTER — Telehealth: Payer: Self-pay

## 2015-11-01 DIAGNOSIS — C3490 Malignant neoplasm of unspecified part of unspecified bronchus or lung: Secondary | ICD-10-CM

## 2015-11-01 MED ORDER — MEGESTROL ACETATE 40 MG/ML PO SUSP: 200.0000 mg | Freq: Two times a day (BID) | ORAL | Status: DC

## 2015-11-01 MED ORDER — OXYCODONE HCL 10 MG PO TABS: 10.0000 mg | ORAL_TABLET | ORAL | Status: DC | PRN

## 2015-11-08 ENCOUNTER — Ambulatory Visit: Payer: Medicaid Other | Admitting: Hematology and Oncology

## 2015-11-12 ENCOUNTER — Ambulatory Visit: Payer: Medicaid Other | Admitting: Internal Medicine

## 2015-11-12 ENCOUNTER — Encounter: Payer: Self-pay | Admitting: *Deleted

## 2015-11-13 ENCOUNTER — Inpatient Hospital Stay: Payer: Medicaid Other | Admitting: Hematology and Oncology

## 2015-11-13 ENCOUNTER — Inpatient Hospital Stay: Payer: Medicaid Other

## 2015-11-20 ENCOUNTER — Telehealth: Payer: Self-pay | Admitting: *Deleted

## 2015-11-20 DIAGNOSIS — C3490 Malignant neoplasm of unspecified part of unspecified bronchus or lung: Secondary | ICD-10-CM

## 2015-11-20 MED ORDER — OMEPRAZOLE 20 MG PO CPDR: 20.0000 mg | DELAYED_RELEASE_CAPSULE | Freq: Every day | ORAL | Status: DC

## 2015-11-20 MED ORDER — MEGESTROL ACETATE 40 MG/ML PO SUSP: 200.0000 mg | Freq: Two times a day (BID) | ORAL | Status: DC

## 2015-11-20 MED ORDER — MORPHINE SULFATE ER 15 MG PO TBCR: 15.0000 mg | EXTENDED_RELEASE_TABLET | Freq: Two times a day (BID) | ORAL | Status: DC

## 2015-11-20 MED ORDER — OXYCODONE HCL 10 MG PO TABS: 10.0000 mg | ORAL_TABLET | ORAL | Status: DC | PRN

## 2015-11-20 NOTE — Telephone Encounter (Signed)
NA

## 2015-11-20 NOTE — Telephone Encounter (Signed)
Will pick up after lunch

## 2015-12-06 ENCOUNTER — Inpatient Hospital Stay: Payer: Medicaid Other | Attending: Hematology and Oncology | Admitting: Hematology and Oncology

## 2015-12-06 ENCOUNTER — Other Ambulatory Visit: Payer: Self-pay | Admitting: Hematology and Oncology

## 2015-12-06 ENCOUNTER — Inpatient Hospital Stay: Payer: Medicaid Other

## 2015-12-10 ENCOUNTER — Telehealth: Payer: Self-pay

## 2015-12-10 ENCOUNTER — Telehealth: Payer: Self-pay | Admitting: *Deleted

## 2015-12-10 DIAGNOSIS — C3490 Malignant neoplasm of unspecified part of unspecified bronchus or lung: Secondary | ICD-10-CM

## 2015-12-10 MED ORDER — MEGESTROL ACETATE 40 MG/ML PO SUSP: 200.0000 mg | Freq: Two times a day (BID) | ORAL | Status: DC

## 2015-12-10 NOTE — Telephone Encounter (Addendum)
Called in and left on VM to [facility]

## 2015-12-10 NOTE — Telephone Encounter (Signed)
Spoke with patient on phone about Survivorship visit.  Patient was unable to come last week for visit.  Patient request Survivorship Care Plan be mailed to her.  Encouraged patient to call if any questions regarding Treatment summary or any resources she would like to utilize.

## 2015-12-13 ENCOUNTER — Telehealth: Payer: Self-pay | Admitting: *Deleted

## 2015-12-13 ENCOUNTER — Other Ambulatory Visit: Payer: Self-pay

## 2015-12-13 DIAGNOSIS — C3490 Malignant neoplasm of unspecified part of unspecified bronchus or lung: Secondary | ICD-10-CM

## 2015-12-13 MED ORDER — OXYCODONE HCL 10 MG PO TABS: 10.0000 mg | ORAL_TABLET | ORAL | Status: DC | PRN

## 2015-12-13 MED ORDER — ONDANSETRON HCL 8 MG PO TABS: 8.0000 mg | ORAL_TABLET | Freq: Two times a day (BID) | ORAL | Status: DC | PRN

## 2015-12-13 MED ORDER — OMEPRAZOLE 20 MG PO CPDR: 20.0000 mg | DELAYED_RELEASE_CAPSULE | Freq: Every day | ORAL | Status: DC

## 2015-12-13 MED ORDER — OMEPRAZOLE 20 MG PO CPDR
20.0000 mg | DELAYED_RELEASE_CAPSULE | Freq: Every day | ORAL | Status: DC
Start: 2015-12-13 — End: 2015-12-13

## 2015-12-13 NOTE — Addendum Note (Signed)
Addended by: Oneida Arenas on: 12/13/2015 09:58 AM   Modules accepted: Orders

## 2015-12-13 NOTE — Telephone Encounter (Signed)
Informed that prescription is ready to pick up  

## 2016-01-01 ENCOUNTER — Telehealth: Payer: Self-pay | Admitting: *Deleted

## 2016-01-01 DIAGNOSIS — C3490 Malignant neoplasm of unspecified part of unspecified bronchus or lung: Secondary | ICD-10-CM

## 2016-01-01 MED ORDER — MEGESTROL ACETATE 40 MG/ML PO SUSP: 200.0000 mg | Freq: Two times a day (BID) | ORAL | Status: DC

## 2016-01-01 NOTE — Telephone Encounter (Signed)
Escribed

## 2016-01-07 ENCOUNTER — Other Ambulatory Visit: Payer: Self-pay

## 2016-01-07 ENCOUNTER — Inpatient Hospital Stay: Payer: Medicaid Other | Attending: Hematology and Oncology | Admitting: Hematology and Oncology

## 2016-01-07 VITALS — BP 161/102 | HR 100 | Temp 96.9°F | Resp 18 | Ht 62.0 in | Wt 117.5 lb

## 2016-01-07 DIAGNOSIS — R040 Epistaxis: Secondary | ICD-10-CM | POA: Diagnosis not present

## 2016-01-07 DIAGNOSIS — R0602 Shortness of breath: Secondary | ICD-10-CM | POA: Diagnosis not present

## 2016-01-07 DIAGNOSIS — F17210 Nicotine dependence, cigarettes, uncomplicated: Secondary | ICD-10-CM | POA: Diagnosis not present

## 2016-01-07 DIAGNOSIS — I10 Essential (primary) hypertension: Secondary | ICD-10-CM | POA: Insufficient documentation

## 2016-01-07 DIAGNOSIS — Z9221 Personal history of antineoplastic chemotherapy: Secondary | ICD-10-CM | POA: Diagnosis not present

## 2016-01-07 DIAGNOSIS — R51 Headache: Secondary | ICD-10-CM | POA: Diagnosis not present

## 2016-01-07 DIAGNOSIS — C3432 Malignant neoplasm of lower lobe, left bronchus or lung: Secondary | ICD-10-CM | POA: Diagnosis present

## 2016-01-07 DIAGNOSIS — Z79899 Other long term (current) drug therapy: Secondary | ICD-10-CM | POA: Diagnosis not present

## 2016-01-07 DIAGNOSIS — H9209 Otalgia, unspecified ear: Secondary | ICD-10-CM | POA: Diagnosis not present

## 2016-01-07 DIAGNOSIS — C3490 Malignant neoplasm of unspecified part of unspecified bronchus or lung: Secondary | ICD-10-CM

## 2016-01-07 MED ORDER — OXYCODONE HCL 10 MG PO TABS: 10.0000 mg | ORAL_TABLET | ORAL | Status: DC | PRN

## 2016-01-07 MED ORDER — OMEPRAZOLE 20 MG PO CPDR: 20.0000 mg | DELAYED_RELEASE_CAPSULE | Freq: Every day | ORAL | Status: DC

## 2016-01-07 NOTE — Progress Notes (Signed)
[facility] day:  01/07/2016   Chief Complaint: Audrey Mcgee is a 55 y.o. female with metastatic poorly differentiated non-small cell lung cancer who is seen for 3 month reassessment.  HPI: The patient was last seen in the medical oncology clinic on 10/08/2015.  At that time, she was seen for 1 month assessment.  She was fatigued.  Exam revealed decreased respiratory excursion.  She was smoking 2 cigarettes a week.  Her chest CT was rescheduled.  Chest CT on 10/17/2015 revealed patchy ground-glass and ill-defined nodular areas of opacity in the left apex, lingula, and anterior left lower lobe generally appeared decreased in the interval although 1 of the nodular areas in the left apex has progressed slightly since the previous study.  Overall imaging features are felt to be most likely treatment related, but close continued attention recommended.  There has been an interval slight decrease in proximal to mid esophageal circumferential wall thickening, potentially treatment related.  She was lost to follow-up.  During the interim, she believes she had the flu for 3 months. She states that about 2 months ago she had a fever to 101 with chills. She took over the counter medications including Dayquil  And Alka-Seltzer cold and flu.  She had some "yellow stuff" coming up.  She also notes some slight epistaxis. She has had shortness of breath with exertion. She has not followed up with pulmonary medicine.   She feels a lot better now.  She had no fever.  She is eating well now.  She notes that time she can choke on water. She denies solid foods get stuck. She has discomfort in her back underneath her shoulder.  She notes a rare headache.  She has had some issues with her vision once in awhile when watching TV. She states that she follows up at the [facility] today to have her ear checked. She notes some inner ear pain.  Past Medical History   Diagnosis Date  . Hypertension   . Lung cancer [facility])     Past Surgical History  Procedure Laterality Date  . Stomach surgery      Pt reports for a tumor    Family History  Problem Relation Age of Onset  . Hypertension Father   . Cancer Father   . Cancer Maternal Aunt     Social History:  reports that she has been smoking Cigarettes.  She has smoked for the past 15 years. She has never used smokeless tobacco. She reports that she drinks about 1.2 oz of alcohol per week. She reports that she does not use illicit drugs.  She states that her nephew shot in [place] (caused delays in her treatment).  Her boyfriend's father recently died of metastatic lung cancer.  Her boyfriend works at a school with children.  The patient is alone today.    Allergies:  Allergies  Allergen Reactions  . No Known Allergies     Current Medications: Current Outpatient Prescriptions  Medication Sig Dispense Refill  . benzonatate (TESSALON) 100 MG capsule Take 1 capsule (100 mg total) by mouth 3 (three) times daily as needed for cough. 20 capsule 0  . DIPHENHYD-LIDOCAINE-NYSTATIN MT Use as directed in the mouth or throat.    . docusate sodium (COLACE) 100 MG capsule Take 100 mg by mouth 2 (two) times daily as needed for mild constipation.    . folic acid (FOLVITE) 220 MCG tablet Take 400 mcg by mouth  daily.    . lidocaine (XYLOCAINE) 2 % solution use 10 milliliters by mouth every 4 hours if needed  0  . losartan (COZAAR) 100 MG tablet Take 1 tablet (100 mg total) by mouth daily. 30 tablet 0  . magic mouthwash w/lidocaine SOLN Take 5 mLs by mouth 4 (four) times daily as needed for mouth pain. 480 mL 1  . megestrol (MEGACE) 40 MG/ML suspension Take 5 mLs (200 mg total) by mouth 2 (two) times daily. 240 mL 0  . morphine (MS CONTIN) 15 MG 12 hr tablet Take 1 tablet (15 mg total) by mouth 2 (two) times daily. 60 tablet 0  . omeprazole (PRILOSEC) 20 MG capsule Take 1 capsule (20 mg total) by mouth daily. 30  capsule 3  . ondansetron (ZOFRAN) 8 MG tablet Take 1 tablet (8 mg total) by mouth 2 (two) times daily as needed for nausea or vomiting. 20 tablet 2  . Oxycodone HCl 10 MG TABS Take 1 tablet (10 mg total) by mouth every 4 (four) hours as needed (pain). 60 tablet 0   No current facility-administered medications for this visit.    Review of Systems:  GENERAL: Feels better. No fevers, sweats or weight loss. Weight up 1 pound. PERFORMANCE STATUS (ECOG): 1 HEENT: Ear pin.  No runny nose,  sore throat, mouth sores or tenderness. Lungs:  Chronic shortness of breath. Non productive cough.  No hemoptysis. Cardiac: No chest pain, palpitations, orthopnea, or PND. GI: No nausea, vomiting, diarrhea, constipation, melena or hematochezia. GU: No urgency, frequency, dysuria, or hematuria. Musculoskeletal: Back pain under shoulder. No joint pain. No muscle tenderness. Extremities: No pain or swelling. Skin: No rashes or skin changes. Neuro: No headache, numbness or weakness, balance or coordination issues. Endocrine: No diabetes, thyroid issues, hot flashes or night sweats. Psych: No mood changes, depression or anxiety. Pain: Pain in shoulder (stable). Review of systems: All other systems reviewed and found to be negative.  Physical Exam: Blood pressure 161/102, pulse 100, temperature 96.9 F (36.1 C), temperature source Tympanic, resp. rate 18, height 5' 2"  (1.575 m), weight 117 lb 8.1 oz (53.3 kg).  GENERAL: Thin woman sitting comfortably in the exam room in no acute distress. MENTAL STATUS: Alert and oriented to person, place and time. HEAD: Short brown wig with highlights. Normocephalic, atraumatic, face symmetric, no Cushingoid features. EYES: Brown eyes. Ruddy sclera.  Pupils equal round and reactive to light and accomodation. No conjunctivitis or scleral icterus. ENT: Oropharynx clear without lesion. Tongue normal. Mucous membranes moist.  RESPIRATORY: Decreased  respiratory excursion.  No rales, wheezes or rhonchi. CARDIOVASCULAR: Regular rate and rhythm without murmur, rub or gallop. ABDOMEN: Soft, non-tender, with active bowel sounds, and no hepatosplenomegaly. No masses. SKIN: No rashes, ulcers or lesions. EXTREMITIES: No edema, no skin discoloration or tenderness. No palpable cords. LYMPH NODES: No palpable cervical, supraclavicular, axillary or inguinal adenopathy  NEUROLOGICAL: Unremarkable. PSYCH: Appropriate.  No visits with results within 3 Day(s) from this visit. Latest known visit with results is:  Hospital Outpatient Visit on 10/17/2015  Component Date Value Ref Range Status  . Creatinine, Ser 10/17/2015 0.70  0.44 - 1.00 mg/dL Final    Assessment:  Audrey Mcgee is a 55 y.o. female with metastatic poorly differentiated adenocarcinoma of lung. She presented with a 5 month history of enlarging bilateral neck masses, progressive hoarseness, dysphagia, dyspnea on exertion, cough, left sided hearing loss, neck pain, and a 30 pound weight loss.  Laryngoscopy on 10/03/2014 revealed complete vocal cord paralysis and  cricoid edema. Biopsy of the right supraclavicular lymph node on 10/03/2014 revealed poorly differentiated non-small cell carcinoma, favoring adenocarcinoma consistent with lung primary (TTF-1 positive). KRAS was positive.   PET scan on 10/24/2014 revealed a left hilar mass with associated left upper lobe obstruction/atelectasis with extensive bilateral mediastinal and cervical adnopathy. Chest CT from 10/10/2014 also noted a 2.2 x 0.9 cm soft tissue density with internal calcifications in the right breast. Head MRI revealed no evidence of metastatic disease.  She received palliative radiation (3000 cGy) to the left lung from 10/24/2014 - 11/07/2014. She has received 3 cycles of carboplatin and Alimta (11/14/2014, 12/05/2014, and 02/13/2015). Last B12 was given on 04/09/2015.   She has pain in her left chest,  throat, and shoulder. She is on oxycodone 10 mg every 6 hours as needed. Morphine causes nausea. She has chronic shortness of breath with exertion.  She received 6 cycles carboplatin and Alimta (11/14/2014 - 04/30/2015). There was a delay between cycle #2 and cycle #3 secondary to a switch in caregivers. She last received B12 on 04/09/2015.  She was diagnosed with a left peri-hilar infiltrate on 02/23/2015. She has completed a course of Levaquin. Chest CT on 03/21/2015 revealed LUL scarring, patchy ill defined airspace opacities in the lingula, upper esophageal thickening, and small mediastinal and hilar nodes. She completed a course of Azithromycin.  She is status post 2 cycles of maintenance Alimta (05/22/2015 - 06/15/2015). Chest CT on 07/09/2015 revealed patchy ground glass and nodular consolidation in the left upper lobe and to a lesser extent left lower lobe, minimally progressive from 03/21/2015.  Bone scan on 07/09/2015 revealed no evidence of metastatic disease.  CXR on 09/24/2015 revealed patchy left perihilar airspace opacities.  She was treated with azithromycin.  Chest CT on 10/17/2015 revealed patchy ground-glass and ill-defined nodular areas of opacity in the left apex, lingula, and anterior left lower lobe generally appeared decreased in the interval although 1 of the nodular areas in the left apex has progressed slightly since the previous study.  Overall imaging features are felt to be most likely treatment related.  There had been an interval slight decrease in proximal to mid esophageal circumferential wall thickening, potentially treatment related.   She was lost to follow-up for approximately 3 months.  She feels like she had the flu.  Symptoms have resolved.  She feels back to baseline.  Plan: 1.  Discuss interim events and unclear status of lung cancer.  Discuss importance of follow-up and treatment for stage IV lung cancer.  Review chest CT from last visit.  Discuss  restaging studies given 3 months of no treatment.  Discuss PET scan or CT scans + bone scan. 2.  Schedule PET scan. 3.  Follow-up at the [facility] today re: elevated blood pressure and ear pain. 4.  RTC after PET scan for MD assess, review of PET scan and labs (CBC with diff, CMP, CEA, TSH).   Lequita Asal, MD  01/07/2016 , 11:19 AM

## 2016-01-11 ENCOUNTER — Other Ambulatory Visit: Payer: Self-pay | Admitting: Hematology and Oncology

## 2016-01-11 DIAGNOSIS — C3490 Malignant neoplasm of unspecified part of unspecified bronchus or lung: Secondary | ICD-10-CM

## 2016-01-14 ENCOUNTER — Other Ambulatory Visit: Payer: Self-pay | Admitting: *Deleted

## 2016-01-14 ENCOUNTER — Encounter: Admission: RE | Admit: 2016-01-14 | Payer: Medicaid Other | Source: Ambulatory Visit

## 2016-01-14 DIAGNOSIS — C3490 Malignant neoplasm of unspecified part of unspecified bronchus or lung: Secondary | ICD-10-CM

## 2016-01-14 MED ORDER — MEGESTROL ACETATE 40 MG/ML PO SUSP: 200.0000 mg | Freq: Two times a day (BID) | ORAL | Status: DC

## 2016-01-14 NOTE — Telephone Encounter (Signed)
Too early for refill, just 240 ml on 1/10

## 2016-01-14 NOTE — Telephone Encounter (Signed)
-----   Message from Cephus Richer sent at 01/14/2016  2:15 PM EST ----- Contact: [phone] Pt need  Refill on   Megestrol .

## 2016-01-14 NOTE — Telephone Encounter (Signed)
E scribed per VO Dr Mike Gip. Spoke with patieitn regarding using a syringe to measure her doses and she told me that when she looked at her measuring cup, she had been using 10 ml instead of 5 by mistake, she is in agreement to use a syringe

## 2016-01-17 ENCOUNTER — Ambulatory Visit: Payer: Medicaid Other | Admitting: Hematology and Oncology

## 2016-01-17 ENCOUNTER — Other Ambulatory Visit: Payer: Medicaid Other

## 2016-01-25 ENCOUNTER — Ambulatory Visit: Admission: RE | Admit: 2016-01-25 | Payer: Medicaid Other | Source: Ambulatory Visit

## 2016-01-28 ENCOUNTER — Encounter: Payer: Self-pay | Admitting: Hematology and Oncology

## 2016-01-29 ENCOUNTER — Inpatient Hospital Stay: Payer: Medicaid Other | Admitting: Family Medicine

## 2016-01-29 ENCOUNTER — Inpatient Hospital Stay: Payer: Medicaid Other | Attending: Family Medicine

## 2016-02-04 ENCOUNTER — Other Ambulatory Visit: Payer: Self-pay | Admitting: Internal Medicine

## 2016-02-04 ENCOUNTER — Telehealth: Payer: Self-pay | Admitting: *Deleted

## 2016-02-04 ENCOUNTER — Ambulatory Visit
Admission: RE | Admit: 2016-02-04 | Discharge: 2016-02-04 | Disposition: A | Discharge status: Home / Self Care | Payer: Medicaid Other | Source: Ambulatory Visit | Attending: Hematology and Oncology | Admitting: Hematology and Oncology

## 2016-02-04 DIAGNOSIS — C3490 Malignant neoplasm of unspecified part of unspecified bronchus or lung: Secondary | ICD-10-CM

## 2016-02-04 LAB — POCT I-STAT CREATININE: Creatinine, Ser: 0.5 mg/dL (ref 0.44–1.00)

## 2016-02-04 MED ORDER — OXYCODONE HCL 10 MG PO TABS: 10.0000 mg | ORAL_TABLET | ORAL | Status: DC | PRN

## 2016-02-04 MED ORDER — MORPHINE SULFATE ER 15 MG PO TBCR: 15.0000 mg | EXTENDED_RELEASE_TABLET | Freq: Two times a day (BID) | ORAL | Status: DC

## 2016-02-04 MED ORDER — IOHEXOL 300 MG/ML  SOLN
100.0000 mL | Freq: Once | INTRAMUSCULAR | Status: AC | PRN
  Administered 2016-02-04: 100 mL via INTRAVENOUS

## 2016-02-04 NOTE — Telephone Encounter (Signed)
Pt comes to cancer center this am and asks Margreta Journey to get [facility] to give her refill of her 2 pain meds. [name] not here today, gave rx through dr Rudean Hitt and id was copied and pt signed form and given rx for mscontin and oxycodone

## 2016-02-11 ENCOUNTER — Ambulatory Visit
Admission: RE | Admit: 2016-02-11 | Discharge: 2016-02-11 | Disposition: A | Discharge status: Home / Self Care | Payer: Medicaid Other | Source: Ambulatory Visit | Attending: Hematology and Oncology | Admitting: Hematology and Oncology

## 2016-02-14 ENCOUNTER — Inpatient Hospital Stay: Payer: Medicaid Other | Attending: Hematology and Oncology | Admitting: Hematology and Oncology

## 2016-02-14 ENCOUNTER — Other Ambulatory Visit: Payer: Self-pay

## 2016-02-14 VITALS — BP 128/85 | HR 118 | Temp 98.3°F | Resp 18 | Ht 62.0 in | Wt 116.0 lb

## 2016-02-14 DIAGNOSIS — R070 Pain in throat: Secondary | ICD-10-CM | POA: Diagnosis not present

## 2016-02-14 DIAGNOSIS — R1310 Dysphagia, unspecified: Secondary | ICD-10-CM | POA: Insufficient documentation

## 2016-02-14 DIAGNOSIS — C779 Secondary and unspecified malignant neoplasm of lymph node, unspecified: Secondary | ICD-10-CM | POA: Diagnosis not present

## 2016-02-14 DIAGNOSIS — R490 Dysphonia: Secondary | ICD-10-CM

## 2016-02-14 DIAGNOSIS — R079 Chest pain, unspecified: Secondary | ICD-10-CM | POA: Insufficient documentation

## 2016-02-14 DIAGNOSIS — J700 Acute pulmonary manifestations due to radiation: Secondary | ICD-10-CM | POA: Diagnosis not present

## 2016-02-14 DIAGNOSIS — C3412 Malignant neoplasm of upper lobe, left bronchus or lung: Secondary | ICD-10-CM

## 2016-02-14 DIAGNOSIS — Z923 Personal history of irradiation: Secondary | ICD-10-CM | POA: Diagnosis not present

## 2016-02-14 DIAGNOSIS — C3490 Malignant neoplasm of unspecified part of unspecified bronchus or lung: Secondary | ICD-10-CM

## 2016-02-14 DIAGNOSIS — Z9221 Personal history of antineoplastic chemotherapy: Secondary | ICD-10-CM | POA: Insufficient documentation

## 2016-02-14 DIAGNOSIS — R0602 Shortness of breath: Secondary | ICD-10-CM | POA: Diagnosis not present

## 2016-02-14 DIAGNOSIS — C781 Secondary malignant neoplasm of mediastinum: Secondary | ICD-10-CM | POA: Diagnosis not present

## 2016-02-14 DIAGNOSIS — M25519 Pain in unspecified shoulder: Secondary | ICD-10-CM

## 2016-02-14 MED ORDER — DEXAMETHASONE 4 MG PO TABS: ORAL_TABLET | ORAL | Status: DC
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
## 2016-02-15 ENCOUNTER — Inpatient Hospital Stay: Payer: Medicaid Other

## 2016-02-15 ENCOUNTER — Other Ambulatory Visit: Payer: Self-pay | Admitting: Hematology and Oncology

## 2016-02-15 DIAGNOSIS — C3490 Malignant neoplasm of unspecified part of unspecified bronchus or lung: Secondary | ICD-10-CM

## 2016-02-15 DIAGNOSIS — C3412 Malignant neoplasm of upper lobe, left bronchus or lung: Secondary | ICD-10-CM | POA: Diagnosis not present

## 2016-02-15 MED ORDER — CYANOCOBALAMIN 1000 MCG/ML IJ SOLN
1000.0000 ug | Freq: Once | INTRAMUSCULAR | Status: AC
  Administered 2016-02-15: 1000 ug via INTRAMUSCULAR
  Filled 2016-02-15: qty 1

## 2016-02-18 ENCOUNTER — Other Ambulatory Visit: Payer: Self-pay | Admitting: Hematology and Oncology

## 2016-02-18 ENCOUNTER — Encounter: Payer: Self-pay | Admitting: Hematology and Oncology

## 2016-02-18 DIAGNOSIS — R490 Dysphonia: Secondary | ICD-10-CM | POA: Insufficient documentation

## 2016-02-19 ENCOUNTER — Other Ambulatory Visit: Payer: Self-pay

## 2016-02-19 ENCOUNTER — Encounter
Admission: RE | Admit: 2016-02-19 | Discharge: 2016-02-19 | Disposition: A | Discharge status: Home / Self Care | Payer: Medicaid Other | Source: Ambulatory Visit | Attending: Hematology and Oncology | Admitting: Hematology and Oncology

## 2016-02-19 DIAGNOSIS — C3490 Malignant neoplasm of unspecified part of unspecified bronchus or lung: Secondary | ICD-10-CM | POA: Insufficient documentation

## 2016-02-19 MED ORDER — TECHNETIUM TC 99M MEDRONATE IV KIT
21.4870 | PACK | Freq: Once | INTRAVENOUS | Status: AC | PRN
  Administered 2016-02-19: 21.487 via INTRAVENOUS

## 2016-02-19 MED ORDER — MEGESTROL ACETATE 40 MG/ML PO SUSP: 200.0000 mg | Freq: Two times a day (BID) | ORAL | Status: DC

## 2016-02-19 MED ORDER — OXYCODONE HCL 10 MG PO TABS: 10.0000 mg | ORAL_TABLET | ORAL | Status: DC | PRN

## 2016-02-22 ENCOUNTER — Inpatient Hospital Stay: Payer: Medicaid Other | Attending: Hematology and Oncology | Admitting: Hematology and Oncology

## 2016-02-22 VITALS — BP 142/91 | HR 120 | Temp 98.7°F | Resp 18 | Ht 62.0 in | Wt 117.1 lb

## 2016-02-22 DIAGNOSIS — C3490 Malignant neoplasm of unspecified part of unspecified bronchus or lung: Secondary | ICD-10-CM

## 2016-02-22 DIAGNOSIS — M25519 Pain in unspecified shoulder: Secondary | ICD-10-CM

## 2016-02-22 DIAGNOSIS — I10 Essential (primary) hypertension: Secondary | ICD-10-CM

## 2016-02-22 DIAGNOSIS — R51 Headache: Secondary | ICD-10-CM

## 2016-02-22 DIAGNOSIS — R1310 Dysphagia, unspecified: Secondary | ICD-10-CM | POA: Diagnosis not present

## 2016-02-22 DIAGNOSIS — R079 Chest pain, unspecified: Secondary | ICD-10-CM

## 2016-02-22 DIAGNOSIS — J700 Acute pulmonary manifestations due to radiation: Secondary | ICD-10-CM

## 2016-02-22 DIAGNOSIS — H538 Other visual disturbances: Secondary | ICD-10-CM | POA: Diagnosis not present

## 2016-02-22 DIAGNOSIS — Z5111 Encounter for antineoplastic chemotherapy: Secondary | ICD-10-CM | POA: Diagnosis not present

## 2016-02-22 DIAGNOSIS — F17210 Nicotine dependence, cigarettes, uncomplicated: Secondary | ICD-10-CM | POA: Diagnosis not present

## 2016-02-22 DIAGNOSIS — R0609 Other forms of dyspnea: Secondary | ICD-10-CM | POA: Diagnosis not present

## 2016-02-22 DIAGNOSIS — R221 Localized swelling, mass and lump, neck: Secondary | ICD-10-CM | POA: Diagnosis not present

## 2016-02-22 DIAGNOSIS — Z923 Personal history of irradiation: Secondary | ICD-10-CM | POA: Diagnosis not present

## 2016-02-22 DIAGNOSIS — C3412 Malignant neoplasm of upper lobe, left bronchus or lung: Secondary | ICD-10-CM

## 2016-02-22 DIAGNOSIS — Z79899 Other long term (current) drug therapy: Secondary | ICD-10-CM

## 2016-02-22 DIAGNOSIS — R634 Abnormal weight loss: Secondary | ICD-10-CM | POA: Insufficient documentation

## 2016-02-22 DIAGNOSIS — R519 Headache, unspecified: Secondary | ICD-10-CM

## 2016-02-22 DIAGNOSIS — R490 Dysphonia: Secondary | ICD-10-CM
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
## 2016-02-26 ENCOUNTER — Other Ambulatory Visit: Payer: Self-pay | Admitting: Hematology and Oncology

## 2016-02-26 DIAGNOSIS — R1310 Dysphagia, unspecified: Secondary | ICD-10-CM

## 2016-02-29 ENCOUNTER — Inpatient Hospital Stay (HOSPITAL_BASED_OUTPATIENT_CLINIC_OR_DEPARTMENT_OTHER): Payer: Medicaid Other | Admitting: Hematology and Oncology

## 2016-02-29 ENCOUNTER — Inpatient Hospital Stay: Payer: Medicaid Other

## 2016-02-29 ENCOUNTER — Other Ambulatory Visit: Payer: Self-pay | Admitting: Hematology and Oncology

## 2016-02-29 ENCOUNTER — Telehealth: Payer: Self-pay | Admitting: Hematology and Oncology

## 2016-02-29 VITALS — BP 138/94 | Temp 99.5°F | Resp 18 | Ht 62.0 in | Wt 116.2 lb

## 2016-02-29 DIAGNOSIS — R221 Localized swelling, mass and lump, neck: Secondary | ICD-10-CM

## 2016-02-29 DIAGNOSIS — R1310 Dysphagia, unspecified: Secondary | ICD-10-CM

## 2016-02-29 DIAGNOSIS — R490 Dysphonia: Secondary | ICD-10-CM | POA: Diagnosis not present

## 2016-02-29 DIAGNOSIS — R634 Abnormal weight loss: Secondary | ICD-10-CM

## 2016-02-29 DIAGNOSIS — R0609 Other forms of dyspnea: Secondary | ICD-10-CM

## 2016-02-29 DIAGNOSIS — C3412 Malignant neoplasm of upper lobe, left bronchus or lung: Secondary | ICD-10-CM

## 2016-02-29 DIAGNOSIS — R7989 Other specified abnormal findings of blood chemistry: Secondary | ICD-10-CM

## 2016-02-29 DIAGNOSIS — R079 Chest pain, unspecified: Secondary | ICD-10-CM

## 2016-02-29 DIAGNOSIS — H538 Other visual disturbances: Secondary | ICD-10-CM

## 2016-02-29 DIAGNOSIS — Z923 Personal history of irradiation: Secondary | ICD-10-CM

## 2016-02-29 DIAGNOSIS — R51 Headache: Secondary | ICD-10-CM

## 2016-02-29 DIAGNOSIS — C3490 Malignant neoplasm of unspecified part of unspecified bronchus or lung: Secondary | ICD-10-CM

## 2016-02-29 DIAGNOSIS — Z5111 Encounter for antineoplastic chemotherapy: Secondary | ICD-10-CM | POA: Diagnosis not present

## 2016-02-29 DIAGNOSIS — F17210 Nicotine dependence, cigarettes, uncomplicated: Secondary | ICD-10-CM

## 2016-02-29 LAB — COMPREHENSIVE METABOLIC PANEL
ALT: 14 U/L (ref 14–54)
AST: 17 U/L (ref 15–41)
Albumin: 4.1 g/dL (ref 3.5–5.0)
Alkaline Phosphatase: 41 U/L (ref 38–126)
Anion gap: 7 (ref 5–15)
BUN: 12 mg/dL (ref 6–20)
CO2: 26 mmol/L (ref 22–32)
Calcium: 9.8 mg/dL (ref 8.9–10.3)
Chloride: 98 mmol/L — ABNORMAL LOW (ref 101–111)
Creatinine, Ser: 0.48 mg/dL (ref 0.44–1.00)
GFR calc Af Amer: >60 mL/min (ref >60)
GFR calc non Af Amer: >60 mL/min (ref >60)
Glucose, Bld: 130 mg/dL — ABNORMAL HIGH (ref 65–99)
Potassium: 3.7 mmol/L (ref 3.5–5.1)
Sodium: 131 mmol/L — ABNORMAL LOW (ref 135–145)
Total Bilirubin: 0.7 mg/dL (ref 0.3–1.2)
Total Protein: 7.8 g/dL (ref 6.5–8.1)

## 2016-02-29 LAB — CBC WITH DIFFERENTIAL/PLATELET
Basophils Absolute: 0 10*3/uL (ref 0–0.1)
Basophils Relative: 1 %
Eosinophils Absolute: 0 10*3/uL (ref 0–0.7)
Eosinophils Relative: 0 %
HCT: 38.7 % (ref 35.0–47.0)
Hemoglobin: 13.4 g/dL (ref 12.0–16.0)
Lymphocytes Relative: 15 %
Lymphs Abs: 1.3 10*3/uL (ref 1.0–3.6)
MCH: 29.7 pg (ref 26.0–34.0)
MCHC: 34.8 g/dL (ref 32.0–36.0)
MCV: 85.3 fL (ref 80.0–100.0)
Monocytes Absolute: 1.2 10*3/uL — ABNORMAL HIGH (ref 0.2–0.9)
Monocytes Relative: 14 %
Neutro Abs: 6 10*3/uL (ref 1.4–6.5)
Neutrophils Relative %: 70 %
Platelets: 213 10*3/uL (ref 150–440)
RBC: 4.53 MIL/uL (ref 3.80–5.20)
RDW: 13.8 % (ref 11.5–14.5)
WBC: 8.6 10*3/uL (ref 3.6–11.0)

## 2016-02-29 LAB — TSH: TSH: 0.051 u[IU]/mL — ABNORMAL LOW (ref 0.350–4.500)

## 2016-02-29 LAB — T4, FREE: Free T4: 3.02 ng/dL — ABNORMAL HIGH (ref 0.61–1.12)

## 2016-02-29 LAB — MAGNESIUM: Magnesium: 1.6 mg/dL — ABNORMAL LOW (ref 1.7–2.4)

## 2016-02-29 MED ORDER — SODIUM CHLORIDE 0.9 % IV SOLN
Freq: Once | INTRAVENOUS | Status: AC
  Administered 2016-02-29: 15:00:00 via INTRAVENOUS
  Filled 2016-02-29: qty 1000

## 2016-02-29 MED ORDER — SODIUM CHLORIDE 0.9 % IV SOLN
Freq: Once | INTRAVENOUS | Status: AC
  Administered 2016-02-29: 15:00:00 via INTRAVENOUS
  Filled 2016-02-29: qty 4

## 2016-02-29 MED ORDER — SODIUM CHLORIDE 0.9 % IV SOLN
500.0000 mg/m2 | Freq: Once | INTRAVENOUS | Status: AC
  Administered 2016-02-29: 750 mg via INTRAVENOUS
  Filled 2016-02-29: qty 24

## 2016-02-29 NOTE — Progress Notes (Signed)
[facility] day:  02/29/2016   Chief Complaint: Audrey Mcgee is a 55 y.o. female with metastatic poorly differentiated non-small cell lung cancer who is seen for assessment prior to reinitiation of maintenance Alimta.Audrey Mcgee  HPI: The Audrey Mcgee was last seen in the medical oncology clinic on 02/22/2016.  At that time, interval bone scan was reviewed.  There was no definite evidence of metastatic disease. There was subtle uptake within the posterior aspects of the upper ribs bilaterally and a tiny focus of increased uptake near the vertex of the calvarium.  She has not yet seen ENT for her ongoing hoarseness and speech for her swallowing difficulties.  She notes that Monday she has follow-up at the [facility].  They will be making her referral to ENT and speech therapy.  She still has hoarseness and swallowing issues.  She is ready to restart Alimta.  She took her Decadron yesterday.  Past Medical History  Diagnosis Date  . Hypertension   . Lung cancer [facility])     Past Surgical History  Procedure Laterality Date  . Stomach surgery      Pt reports for a tumor    Family History  Problem Relation Age of Onset  . Hypertension Father   . Cancer Father   . Cancer Maternal Aunt     Social History:  reports that she has been smoking Cigarettes.  She has smoked for the past 15 years. She has never used smokeless tobacco. She reports that she drinks about 1.2 oz of alcohol per week. She reports that she does not use illicit drugs.  She states that her nephew shot in [place] (caused delays in her treatment).  Her boyfriend's father recently died of metastatic lung cancer.  Her boyfriend works at a school with children.  The Audrey Mcgee is alone today.    Allergies:  Allergies  Allergen Reactions  . No Known Allergies     Current Medications: Current Outpatient Prescriptions  Medication Sig Dispense Refill  . benzonatate (TESSALON) 100  MG capsule Take 1 capsule (100 mg total) by mouth 3 (three) times daily as needed for cough. 20 capsule 0  . dexamethasone (DECADRON) 4 MG tablet Take 65m BID starting day before treatment for a total of 3 days. 20 tablet 0  . DIPHENHYD-LIDOCAINE-NYSTATIN MT Use as directed in the mouth or throat.    . docusate sodium (COLACE) 100 MG capsule Take 100 mg by mouth 2 (two) times daily as needed for mild constipation.    . folic acid (FOLVITE) 8347MCG tablet Take 400 mcg by mouth daily.    .Audrey Kitchenlidocaine (XYLOCAINE) 2 % solution use 10 milliliters by mouth every 4 hours if needed  0  . losartan (COZAAR) 100 MG tablet Take 1 tablet (100 mg total) by mouth daily. 30 tablet 0  . magic mouthwash w/lidocaine SOLN Take 5 mLs by mouth 4 (four) times daily as needed for mouth pain. 480 mL 1  . megestrol (MEGACE) 40 MG/ML suspension Take 5 mLs (200 mg total) by mouth 2 (two) times daily. 300 mL 0  . morphine (MS CONTIN) 15 MG 12 hr tablet Take 1 tablet (15 mg total) by mouth 2 (two) times daily. 60 tablet 0  . omeprazole (PRILOSEC) 20 MG capsule Take 1 capsule (20 mg total) by mouth daily. 30 capsule 3  . ondansetron (ZOFRAN) 8 MG tablet Take 1 tablet (8 mg total) by mouth 2 (two) times daily as needed  for nausea or vomiting. 20 tablet 2  . Oxycodone HCl 10 MG TABS Take 1 tablet (10 mg total) by mouth every 4 (four) hours as needed (pain). 60 tablet 0   No current facility-administered medications for this visit.    Review of Systems:  GENERAL: Feels "ok". No fevers, sweats or weight loss. Weight down 1 pound. PERFORMANCE STATUS (ECOG): 1 HEENT: Hoarse x 5 weeks.  Visual changes.  No sore throat, mouth sores or tenderness. Lungs:  Chronic shortness of breath with exertion.  No cough.  No hemoptysis. Cardiac: No chest pain, palpitations, orthopnea, or PND. GI: Trouble swallowing (stable).  No nausea, vomiting, diarrhea, constipation, melena or hematochezia. GU: No urgency, frequency, dysuria, or  hematuria. Musculoskeletal: Back pain under shoulder. No joint pain. No muscle tenderness. Extremities: No pain or swelling. Skin: No rashes or skin changes. Neuro: Headache, intermittent.  No numbness or weakness, balance or coordination issues. Endocrine: No diabetes, thyroid issues, hot flashes or night sweats. Psych: No mood changes, depression or anxiety. Pain: Pain in shoulder (stable). Review of systems: All other systems reviewed and found to be negative.  Physical Exam: Blood pressure 138/94, temperature 99.5 F (37.5 C), temperature source Tympanic, resp. rate 18, height _0  (1.575 m), weight 116 lb 2.9 oz (52.7 kg), SpO2 98 %.  GENERAL: Thin woman sitting comfortably in the exam room in no acute distress. MENTAL STATUS: Alert and oriented to person, place and time. HEAD: Short brown wig with highlights. Normocephalic, atraumatic, face symmetric, no Cushingoid features. EYES: Brown eyes. Ruddy sclera.  Pupils equal round and reactive to light and accomodation. No conjunctivitis or scleral icterus. ENT: Hoarse.  Voice barely audible.  Oropharynx clear without lesion. Tongue normal. Mucous membranes moist.  RESPIRATORY: No rales, wheezes or rhonchi. CARDIOVASCULAR: Regular rate and rhythm without murmur, rub or gallop. ABDOMEN: Soft, non-tender, with active bowel sounds, and no hepatosplenomegaly. No masses. SKIN: No rashes, ulcers or lesions. EXTREMITIES: No edema, no skin discoloration or tenderness. No palpable cords. LYMPH NODES: No palpable cervical, supraclavicular, axillary or inguinal adenopathy  NEUROLOGICAL: Unremarkable. PSYCH: Appropriate.  Appointment on 02/29/2016  Component Date Value Ref Range Status  . WBC 02/29/2016 8.6  3.6 - 11.0 K/uL Final  . RBC 02/29/2016 4.53  3.80 - 5.20 MIL/uL Final  . Hemoglobin 02/29/2016 13.4  12.0 - 16.0 g/dL Final  . HCT 02/29/2016 38.7  35.0 - 47.0 % Final  . MCV 02/29/2016 85.3  80.0 - 100.0 fL  Final  . MCH 02/29/2016 29.7  26.0 - 34.0 pg Final  . MCHC 02/29/2016 34.8  32.0 - 36.0 g/dL Final  . RDW 02/29/2016 13.8  11.5 - 14.5 % Final  . Platelets 02/29/2016 213  150 - 440 K/uL Final  . Neutrophils Relative % 02/29/2016 70   Final  . Neutro Abs 02/29/2016 6.0  1.4 - 6.5 K/uL Final  . Lymphocytes Relative 02/29/2016 15   Final  . Lymphs Abs 02/29/2016 1.3  1.0 - 3.6 K/uL Final  . Monocytes Relative 02/29/2016 14   Final  . Monocytes Absolute 02/29/2016 1.2* 0.2 - 0.9 K/uL Final  . Eosinophils Relative 02/29/2016 0   Final  . Eosinophils Absolute 02/29/2016 0.0  0 - 0.7 K/uL Final  . Basophils Relative 02/29/2016 1   Final  . Basophils Absolute 02/29/2016 0.0  0 - 0.1 K/uL Final  . Sodium 02/29/2016 131* 135 - 145 mmol/L Final  . Potassium 02/29/2016 3.7  3.5 - 5.1 mmol/L Final  . Chloride 02/29/2016 98*  101 - 111 mmol/L Final  . CO2 02/29/2016 26  22 - 32 mmol/L Final  . Glucose, Bld 02/29/2016 130* 65 - 99 mg/dL Final  . BUN 02/29/2016 12  6 - 20 mg/dL Final  . Creatinine, Ser 02/29/2016 0.48  0.44 - 1.00 mg/dL Final  . Calcium 02/29/2016 9.8  8.9 - 10.3 mg/dL Final  . Total Protein 02/29/2016 7.8  6.5 - 8.1 g/dL Final  . Albumin 02/29/2016 4.1  3.5 - 5.0 g/dL Final  . AST 02/29/2016 17  15 - 41 U/L Final  . ALT 02/29/2016 14  14 - 54 U/L Final  . Alkaline Phosphatase 02/29/2016 41  38 - 126 U/L Final  . Total Bilirubin 02/29/2016 0.7  0.3 - 1.2 mg/dL Final  . GFR calc non Af Amer 02/29/2016 >60  >60 mL/min Final  . GFR calc Af Amer 02/29/2016 >60  >60 mL/min Final   Comment: (NOTE) The eGFR has been calculated using the CKD EPI equation. This calculation has not been validated in all clinical situations. eGFR's persistently <60 mL/min signify possible Chronic Kidney Disease.   . Anion gap 02/29/2016 7  5 - 15 Final  . Magnesium 02/29/2016 1.6* 1.7 - 2.4 mg/dL Final    Assessment:  Mekenzie Modeste is a 55 y.o. female with metastatic poorly differentiated  adenocarcinoma of lung. She presented with a 5 month history of enlarging bilateral neck masses, progressive hoarseness, dysphagia, dyspnea on exertion, cough, left sided hearing loss, neck pain, and a 30 pound weight loss.  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 Biopsy of the right supraclavicular lymph node on 10/03/2014 revealed poorly differentiated non-small cell carcinoma, favoring adenocarcinoma consistent with lung primary (TTF-1 positive). KRAS was positive.   PET scan on 10/24/2014 revealed a left hilar mass with associated left upper lobe obstruction/atelectasis with extensive bilateral mediastinal and cervical adnopathy. Chest CT from 10/10/2014 also noted a 2.2 x 0.9 cm soft tissue density with internal calcifications in the right breast. Head MRI revealed no evidence of metastatic disease.  She received palliative radiation (3000 cGy) to the left lung from 10/24/2014 - 11/07/2014. She has received 3 cycles of carboplatin and Alimta (11/14/2014, 12/05/2014, and 02/13/2015). Last B12 was given on 04/09/2015.   She has pain in her left chest, throat, and shoulder. She is on oxycodone 10 mg every 6 hours as needed. Morphine causes nausea. She has chronic shortness of breath with exertion.  She received 6 cycles carboplatin and Alimta (11/14/2014 - 04/30/2015). There was a delay between cycle #2 and cycle #3 secondary to a switch in caregivers. She last received B12 on 02/15/2016.  She was diagnosed with a left peri-hilar infiltrate on 02/23/2015. She has completed a course of Levaquin. Chest CT on 03/21/2015 revealed LUL scarring, patchy ill defined airspace opacities in the lingula, upper esophageal thickening, and small mediastinal and hilar nodes. She completed a course of Azithromycin.  She is status post 2 cycles of maintenance Alimta (05/22/2015 - 06/15/2015). Chest CT on 07/09/2015 revealed patchy ground glass and nodular  consolidation in the left upper lobe and to a lesser extent left lower lobe, minimally progressive from 03/21/2015.  Bone scan on 07/09/2015 revealed no evidence of metastatic disease.  CXR on 09/24/2015 revealed patchy left perihilar airspace opacities.  She was treated with azithromycin.  Chest CT on 10/17/2015 revealed patchy ground-glass and ill-defined nodular areas of opacity in the left apex, lingula, and anterior left lower lobe generally appeared decreased in the  interval although 1 of the nodular areas in the left apex has progressed slightly since the previous study.  Overall imaging features are felt to be most likely treatment related.  There had been an interval slight decrease in proximal to mid esophageal circumferential wall thickening, potentially treatment related.   She was lost to follow-up for approximately 3 months.  Chest and abdomen CT scan on 02/04/2016 revealed evolving radiation changes in the left upper and left lower lobes. Previously measured left upper lobe nodule was no longer visualized. There was no evidence of metastatic disease.  Right hilar lymph nodes were not considered enlarged by CT size criteria and are nonspecific, but appeared new from the prior exam.  Bone scan on 02/19/2016 revealed no definite evidence of metastatic disease. There was subtle uptake within the posterior aspects of the upper ribs bilaterally (nonspecific and not entirely new).  There was a tiny focus of increased uptake near the vertex of the calvarium (new).  She has a 5 week history of being hoarse.  She has swallowing difficulties (worse with liquids). She has intermittent headaches and visual changes.  Exam is stable.  She has mild hypomagnesemia.  Plan: 1.  Labs today:  CBC with diff, CMP, Mg. 2.  Cycle #1 Alimta today. 3.  Follow-up ENT and speech therapy consults. 4.  Discuss low magnesium.  Audrey Mcgee to try oral magnesium. 5.  RTC on 03/10/2016 for nadir counts (CBC with diff). 6.   RTC in 3 weeks for MD assessment, labs (CBC with diff, CMP, Mg, CEA), and cycle #2 Alimta.  Addendum:  Audrey Mcgee has not had her head CT.  Will follow-up.   Lequita Asal, MD  02/29/2016 , 2:56 PM

## 2016-02-29 NOTE — Telephone Encounter (Signed)
Re:  Hyperthyroid  Called patient this evening after labs returned with a low TSH and high free T4 indicative of primary hyperthyroidism.  Labs will be faxed to Dr. Tomasa Hose.  Lequita Asal, MD

## 2016-03-01 LAB — CEA: CEA: 3.3 ng/mL (ref 0.0–4.7)

## 2016-03-09 ENCOUNTER — Encounter: Payer: Self-pay | Admitting: Hematology and Oncology

## 2016-03-10 ENCOUNTER — Inpatient Hospital Stay: Payer: Medicaid Other

## 2016-03-10 ENCOUNTER — Other Ambulatory Visit: Payer: Self-pay | Admitting: *Deleted

## 2016-03-10 ENCOUNTER — Telehealth: Payer: Self-pay | Admitting: *Deleted

## 2016-03-10 DIAGNOSIS — C3490 Malignant neoplasm of unspecified part of unspecified bronchus or lung: Secondary | ICD-10-CM

## 2016-03-10 MED ORDER — OXYCODONE HCL 10 MG PO TABS: 10.0000 mg | ORAL_TABLET | ORAL | Status: DC | PRN

## 2016-03-10 NOTE — Telephone Encounter (Signed)
Notified patient that oxycodone refill is ready for pick up.

## 2016-03-11 ENCOUNTER — Telehealth: Payer: Self-pay | Admitting: *Deleted

## 2016-03-11 DIAGNOSIS — C3490 Malignant neoplasm of unspecified part of unspecified bronchus or lung: Secondary | ICD-10-CM

## 2016-03-11 MED ORDER — MEGESTROL ACETATE 40 MG/ML PO SUSP: 200.0000 mg | Freq: Two times a day (BID) | ORAL | Status: DC

## 2016-03-11 NOTE — Telephone Encounter (Signed)
Refill for megace sent to [facility] pharmacy.

## 2016-03-21 ENCOUNTER — Inpatient Hospital Stay: Payer: Medicaid Other

## 2016-03-21 ENCOUNTER — Inpatient Hospital Stay: Payer: Medicaid Other | Admitting: Hematology and Oncology

## 2016-03-21 ENCOUNTER — Other Ambulatory Visit: Payer: Self-pay | Admitting: *Deleted

## 2016-03-21 NOTE — Telephone Encounter (Signed)
Filled last week

## 2016-03-28 ENCOUNTER — Inpatient Hospital Stay: Payer: Medicaid Other

## 2016-03-28 ENCOUNTER — Inpatient Hospital Stay: Payer: Medicaid Other | Attending: Hematology and Oncology

## 2016-03-28 ENCOUNTER — Inpatient Hospital Stay (HOSPITAL_BASED_OUTPATIENT_CLINIC_OR_DEPARTMENT_OTHER): Payer: Medicaid Other | Admitting: Hematology and Oncology

## 2016-03-28 ENCOUNTER — Other Ambulatory Visit: Payer: Self-pay

## 2016-03-28 ENCOUNTER — Other Ambulatory Visit: Payer: Self-pay | Admitting: Hematology and Oncology

## 2016-03-28 VITALS — BP 146/97 | HR 114 | Temp 98.8°F | Resp 18 | Wt 114.0 lb

## 2016-03-28 DIAGNOSIS — C770 Secondary and unspecified malignant neoplasm of lymph nodes of head, face and neck: Secondary | ICD-10-CM | POA: Diagnosis not present

## 2016-03-28 DIAGNOSIS — R490 Dysphonia: Secondary | ICD-10-CM | POA: Insufficient documentation

## 2016-03-28 DIAGNOSIS — R634 Abnormal weight loss: Secondary | ICD-10-CM | POA: Diagnosis not present

## 2016-03-28 DIAGNOSIS — C3490 Malignant neoplasm of unspecified part of unspecified bronchus or lung: Secondary | ICD-10-CM

## 2016-03-28 DIAGNOSIS — R0602 Shortness of breath: Secondary | ICD-10-CM

## 2016-03-28 DIAGNOSIS — R0789 Other chest pain: Secondary | ICD-10-CM

## 2016-03-28 DIAGNOSIS — R1310 Dysphagia, unspecified: Secondary | ICD-10-CM | POA: Insufficient documentation

## 2016-03-28 DIAGNOSIS — C3402 Malignant neoplasm of left main bronchus: Secondary | ICD-10-CM

## 2016-03-28 DIAGNOSIS — Z79899 Other long term (current) drug therapy: Secondary | ICD-10-CM | POA: Insufficient documentation

## 2016-03-28 DIAGNOSIS — Z5111 Encounter for antineoplastic chemotherapy: Secondary | ICD-10-CM | POA: Diagnosis present

## 2016-03-28 DIAGNOSIS — I10 Essential (primary) hypertension: Secondary | ICD-10-CM

## 2016-03-28 DIAGNOSIS — Z923 Personal history of irradiation: Secondary | ICD-10-CM | POA: Diagnosis not present

## 2016-03-28 DIAGNOSIS — G8929 Other chronic pain: Secondary | ICD-10-CM | POA: Insufficient documentation

## 2016-03-28 DIAGNOSIS — F17210 Nicotine dependence, cigarettes, uncomplicated: Secondary | ICD-10-CM | POA: Insufficient documentation

## 2016-03-28 LAB — COMPREHENSIVE METABOLIC PANEL
ALT: 21 U/L (ref 14–54)
AST: 33 U/L (ref 15–41)
Albumin: 4.1 g/dL (ref 3.5–5.0)
Alkaline Phosphatase: 48 U/L (ref 38–126)
Anion gap: 8 (ref 5–15)
BUN: 11 mg/dL (ref 6–20)
CO2: 27 mmol/L (ref 22–32)
Calcium: 9.7 mg/dL (ref 8.9–10.3)
Chloride: 102 mmol/L (ref 101–111)
Creatinine, Ser: 0.46 mg/dL (ref 0.44–1.00)
GFR calc Af Amer: >60 mL/min (ref >60)
GFR calc non Af Amer: >60 mL/min (ref >60)
Glucose, Bld: 126 mg/dL — ABNORMAL HIGH (ref 65–99)
Potassium: 3.6 mmol/L (ref 3.5–5.1)
Sodium: 137 mmol/L (ref 135–145)
Total Bilirubin: 0.9 mg/dL (ref 0.3–1.2)
Total Protein: 7.9 g/dL (ref 6.5–8.1)

## 2016-03-28 LAB — CBC WITH DIFFERENTIAL/PLATELET
Basophils Absolute: 0 10*3/uL (ref 0–0.1)
Basophils Relative: 0 %
Eosinophils Absolute: 0 10*3/uL (ref 0–0.7)
Eosinophils Relative: 0 %
HCT: 42.1 % (ref 35.0–47.0)
Hemoglobin: 14.4 g/dL (ref 12.0–16.0)
Lymphocytes Relative: 14 %
Lymphs Abs: 1.4 10*3/uL (ref 1.0–3.6)
MCH: 30 pg (ref 26.0–34.0)
MCHC: 34.2 g/dL (ref 32.0–36.0)
MCV: 87.8 fL (ref 80.0–100.0)
Monocytes Absolute: 1.4 10*3/uL — ABNORMAL HIGH (ref 0.2–0.9)
Monocytes Relative: 14 %
Neutro Abs: 7 10*3/uL — ABNORMAL HIGH (ref 1.4–6.5)
Neutrophils Relative %: 72 %
Platelets: 198 10*3/uL (ref 150–440)
RBC: 4.8 MIL/uL (ref 3.80–5.20)
RDW: 15.4 % — ABNORMAL HIGH (ref 11.5–14.5)
WBC: 9.9 10*3/uL (ref 3.6–11.0)

## 2016-03-28 LAB — MAGNESIUM: Magnesium: 1.6 mg/dL — ABNORMAL LOW (ref 1.7–2.4)

## 2016-03-28 MED ORDER — CYANOCOBALAMIN 1000 MCG/ML IJ SOLN
1000.0000 ug | Freq: Once | INTRAMUSCULAR | Status: AC
  Administered 2016-03-28: 1000 ug via INTRAMUSCULAR
  Filled 2016-03-28: qty 1

## 2016-03-28 MED ORDER — SODIUM CHLORIDE 0.9 % IV SOLN
Freq: Once | INTRAVENOUS | Status: AC
  Administered 2016-03-28: 15:00:00 via INTRAVENOUS
  Filled 2016-03-28: qty 4

## 2016-03-28 MED ORDER — SODIUM CHLORIDE 0.9 % IV SOLN
500.0000 mg/m2 | Freq: Once | INTRAVENOUS | Status: AC
  Administered 2016-03-28: 750 mg via INTRAVENOUS
  Filled 2016-03-28: qty 24

## 2016-03-28 MED ORDER — MAGNESIUM SULFATE 50 % IJ SOLN
1.0000 g | Freq: Once | INTRAMUSCULAR | Status: AC
  Administered 2016-03-28: 1 g via INTRAVENOUS
  Filled 2016-03-28: qty 2

## 2016-03-28 MED ORDER — HEPARIN SOD (PORK) LOCK FLUSH 100 UNIT/ML IV SOLN
500.0000 [IU] | Freq: Once | INTRAVENOUS | Status: DC | PRN
Start: 2016-03-28 — End: 2016-04-16

## 2016-03-28 MED ORDER — SODIUM CHLORIDE 0.9 % IV SOLN
Freq: Once | INTRAVENOUS | Status: AC
  Administered 2016-03-28: 15:00:00 via INTRAVENOUS
  Filled 2016-03-28: qty 1000

## 2016-03-28 MED ORDER — MORPHINE SULFATE ER 15 MG PO TBCR: 15.0000 mg | EXTENDED_RELEASE_TABLET | Freq: Two times a day (BID) | ORAL | Status: DC

## 2016-03-28 MED ORDER — OXYCODONE HCL 10 MG PO TABS: 10.0000 mg | ORAL_TABLET | ORAL | Status: DC | PRN

## 2016-03-28 MED ORDER — MAGNESIUM SULFATE 50 % IJ SOLN
1.0000 g | Freq: Once | INTRAMUSCULAR | Status: DC
  Filled 2016-03-28: qty 2

## 2016-03-28 NOTE — Progress Notes (Signed)
[facility] [facility] day:  03/28/2016   Chief Complaint: Audrey Mcgee is a 55 y.o. female with metastatic poorly differentiated non-small cell lung cancer who is seen for assessment prior to reinitiation of maintenance Alimta.[name]  HPI: The patient was last seen in the medical oncology clinic on 02/29/2016.  At that time, she was still hoarse and had swallowing difficulties.  ENT and speech therapy consults were pending.  She had mild hypomagnesemia.  She received cycle #1 Alimta.  Head CT was rescheduled.  She continues to have swallowing difficulties.  She sometimes "chokes on my spit".   She remains hoarse.  She is drinking 2 Ensure a day.  She has chronic pain in her upper back.  She is sometimes short of breath.  She took her Decadron yesterday.  She takes her folic acid daily.   Past Medical History  Diagnosis Date  . Hypertension   . Lung cancer [facility])     Past Surgical History  Procedure Laterality Date  . Stomach surgery      Pt reports for a tumor    Family History  Problem Relation Age of Onset  . Hypertension Father   . Cancer Father   . Cancer Maternal Aunt     Social History:  reports that she has been smoking Cigarettes.  She has smoked for the past 15 years. She has never used smokeless tobacco. She reports that she drinks about 1.2 oz of alcohol per week. She reports that she does not use illicit drugs.  She states that her nephew shot in [place] (caused delays in her treatment).  Her boyfriend's father recently died of metastatic lung cancer.  Her boyfriend works at a school with children.  The patient's 1st cousin died recently (age 33) of a cerebral aneurysm.  The patient is alone today.    Allergies:  Allergies  Allergen Reactions  . No Known Allergies     Current Medications: Current Outpatient Prescriptions  Medication Sig Dispense Refill  . benzonatate (TESSALON) 100 MG capsule Take 1 capsule (100 mg total)  by mouth 3 (three) times daily as needed for cough. 20 capsule 0  . dexamethasone (DECADRON) 4 MG tablet Take 45m BID starting day before treatment for a total of 3 days. 20 tablet 0  . DIPHENHYD-LIDOCAINE-NYSTATIN MT Use as directed 1 Dose in the mouth or throat once a week.     . docusate sodium (COLACE) 100 MG capsule Take 100 mg by mouth 2 (two) times daily as needed for mild constipation.    . folic acid (FOLVITE) 8861MCG tablet Take 400 mcg by mouth daily.    .Marland Kitchenlidocaine (XYLOCAINE) 2 % solution use 10 milliliters by mouth every 4 hours if needed  0  . losartan (COZAAR) 100 MG tablet Take 1 tablet (100 mg total) by mouth daily. 30 tablet 0  . magic mouthwash w/lidocaine SOLN Take 5 mLs by mouth 4 (four) times daily as needed for mouth pain. 480 mL 1  . megestrol (MEGACE) 40 MG/ML suspension Take 5 mLs (200 mg total) by mouth 2 (two) times daily. 300 mL 0  . morphine (MS CONTIN) 15 MG 12 hr tablet Take 1 tablet (15 mg total) by mouth 2 (two) times daily. 60 tablet 0  . omeprazole (PRILOSEC) 20 MG capsule Take 1 capsule (20 mg total) by mouth daily. 30 capsule 3  . ondansetron (ZOFRAN) 8 MG tablet Take 1 tablet (8 mg total) by mouth 2 (  two) times daily as needed for nausea or vomiting. 20 tablet 2  . Oxycodone HCl 10 MG TABS Take 1 tablet (10 mg total) by mouth every 4 (four) hours as needed (pain). 60 tablet 0   No current facility-administered medications for this visit.    Review of Systems:  GENERAL: Feels "ok". No fevers, sweats or weight loss. Weight down 1 pound. PERFORMANCE STATUS (ECOG): 1 HEENT: Chronic hoarseness.  Visual changes.  No sore throat, mouth sores or tenderness. Lungs:  Shortness of breath with exertion.  No cough.  No hemoptysis. Cardiac: No chest pain, palpitations, orthopnea, or PND. GI: Trouble swallowing ("chokes on spit").  No nausea, vomiting, diarrhea, constipation, melena or hematochezia. GU: No urgency, frequency, dysuria, or  hematuria. Musculoskeletal: Back pain under shoulder. No joint pain. No muscle tenderness. Extremities: No pain or swelling. Skin: No rashes or skin changes. Neuro: Headache, intermittent.  No numbness or weakness, balance or coordination issues. Endocrine: No diabetes, thyroid issues, hot flashes or night sweats. Psych: No mood changes, depression or anxiety. Pain: Pain in shoulder (stable). Review of systems: All other systems reviewed and found to be negative.  Physical Exam: Blood pressure 146/97, pulse 114, temperature 98.8 F (37.1 C), temperature source Tympanic, resp. rate 18, weight 113 lb 15.7 oz (51.7 kg).  GENERAL: Thin woman sitting comfortably in the exam room in no acute distress. MENTAL STATUS: Alert and oriented to person, place and time. HEAD: Short brown wig with highlights. Normocephalic, atraumatic, face symmetric, no Cushingoid features. EYES: Brown eyes. Ruddy sclera.  Pupils equal round and reactive to light and accomodation. No conjunctivitis or scleral icterus. ENT: Hoarse.  Voice barely audible.  Oropharynx clear without lesion. Tongue normal. Mucous membranes moist.  RESPIRATORY: No rales, wheezes or rhonchi. CARDIOVASCULAR: Regular rate and rhythm without murmur, rub or gallop. ABDOMEN: Soft, non-tender, with active bowel sounds, and no hepatosplenomegaly. No masses. SKIN: No rashes, ulcers or lesions. EXTREMITIES: No edema, no skin discoloration or tenderness. No palpable cords. LYMPH NODES: No palpable cervical, supraclavicular, axillary or inguinal adenopathy  NEUROLOGICAL: Unremarkable. PSYCH: Appropriate.  Appointment on 03/28/2016  Component Date Value Ref Range Status  . WBC 03/28/2016 9.9  3.6 - 11.0 K/uL Final  . RBC 03/28/2016 4.80  3.80 - 5.20 MIL/uL Final  . Hemoglobin 03/28/2016 14.4  12.0 - 16.0 g/dL Final  . HCT 03/28/2016 42.1  35.0 - 47.0 % Final  . MCV 03/28/2016 87.8  80.0 - 100.0 fL Final  . MCH 03/28/2016  30.0  26.0 - 34.0 pg Final  . MCHC 03/28/2016 34.2  32.0 - 36.0 g/dL Final  . RDW 03/28/2016 15.4* 11.5 - 14.5 % Final  . Platelets 03/28/2016 198  150 - 440 K/uL Final  . Neutrophils Relative % 03/28/2016 72   Final  . Neutro Abs 03/28/2016 7.0* 1.4 - 6.5 K/uL Final  . Lymphocytes Relative 03/28/2016 14   Final  . Lymphs Abs 03/28/2016 1.4  1.0 - 3.6 K/uL Final  . Monocytes Relative 03/28/2016 14   Final  . Monocytes Absolute 03/28/2016 1.4* 0.2 - 0.9 K/uL Final  . Eosinophils Relative 03/28/2016 0   Final  . Eosinophils Absolute 03/28/2016 0.0  0 - 0.7 K/uL Final  . Basophils Relative 03/28/2016 0   Final  . Basophils Absolute 03/28/2016 0.0  0 - 0.1 K/uL Final    Assessment:  Audrey Mcgee is a 55 y.o. female with metastatic poorly differentiated adenocarcinoma of lung. She presented with a 5 month history of enlarging bilateral  neck masses, progressive hoarseness, dysphagia, dyspnea on exertion, cough, left sided hearing loss, neck pain, and a 30 pound weight loss.  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 Biopsy of the right supraclavicular lymph node on 10/03/2014 revealed poorly differentiated non-small cell carcinoma, favoring adenocarcinoma consistent with lung primary (TTF-1 positive). KRAS was positive.   PET scan on 10/24/2014 revealed a left hilar mass with associated left upper lobe obstruction/atelectasis with extensive bilateral mediastinal and cervical adnopathy. Chest CT from 10/10/2014 also noted a 2.2 x 0.9 cm soft tissue density with internal calcifications in the right breast. Head MRI revealed no evidence of metastatic disease.  She received palliative radiation (3000 cGy) to the left lung from 10/24/2014 - 11/07/2014. She has received 3 cycles of carboplatin and Alimta (11/14/2014, 12/05/2014, and 02/13/2015). Last B12 was given on 04/09/2015.   She has pain in her left chest, throat, and shoulder. She is on  oxycodone 10 mg every 6 hours as needed. Morphine causes nausea. She has chronic shortness of breath with exertion.  She received 6 cycles carboplatin and Alimta (11/14/2014 - 04/30/2015). There was a delay between cycle #2 and cycle #3 secondary to a switch in caregivers.  She was diagnosed with a left peri-hilar infiltrate on 02/23/2015. She has completed a course of Levaquin. Chest CT on 03/21/2015 revealed LUL scarring, patchy ill defined airspace opacities in the lingula, upper esophageal thickening, and small mediastinal and hilar nodes. She completed a course of Azithromycin.  She received 2 cycles of maintenance Alimta (05/22/2015 - 06/15/2015). Chest CT on 07/09/2015 revealed patchy ground glass and nodular consolidation in the left upper lobe and to a lesser extent left lower lobe, minimally progressive from 03/21/2015.  Bone scan on 07/09/2015 revealed no evidence of metastatic disease.  CXR on 09/24/2015 revealed patchy left perihilar airspace opacities.  She was treated with azithromycin.  Chest CT on 10/17/2015 revealed patchy ground-glass and ill-defined nodular areas of opacity in the left apex, lingula, and anterior left lower lobe generally appeared decreased in the interval although 1 of the nodular areas in the left apex has progressed slightly since the previous study.  Overall imaging features are felt to be most likely treatment related.  There had been an interval slight decrease in proximal to mid esophageal circumferential wall thickening, potentially treatment related.   She was lost to follow-up for approximately 3 months.  Chest and abdomen CT scan on 02/04/2016 revealed evolving radiation changes in the left upper and left lower lobes. Previously measured left upper lobe nodule was no longer visualized. There was no evidence of metastatic disease.  Right hilar lymph nodes were not considered enlarged by CT size criteria and are nonspecific, but appeared new from the  prior exam.  Bone scan on 02/19/2016 revealed no definite evidence of metastatic disease. There was subtle uptake within the posterior aspects of the upper ribs bilaterally (nonspecific and not entirely new).  There was a tiny focus of increased uptake near the vertex of the calvarium (new).  She restarted maintenance Alimta on 02/29/2016.  She receives B12 every 9 weeks and is on daily folic acid.  She tolerated her chemotherapy well.  She has persistent hoarseness.  She has difficulty swallowing.  She has not had her ENT and swallowing evaluations.  She has mild hypomagnesemia (1.6) which has not improved on oral magnesium.  Plan: 1.  Labs today:  CBC with diff, CMP, Mg, CEA. 2.  Cycle #2 Alimta today. 3.  Magnesium sulfate  1 gm IV today. 4.  Reschedule ENT and speech therapy consults. 5.  Reschedule head CT next week. 6.  RTC in 3 weeks for MD assessment, labs (CBC with diff, CMP, Mg, CEA), and cycle #3 Alimta.   Lequita Asal, MD  03/28/2016 , 1:54 PM

## 2016-03-28 NOTE — Progress Notes (Signed)
Pt is complaining of chronic pain in upper back that has been taking pain medication for. Requests refill of oxycodone and morphine today.

## 2016-03-29 LAB — CEA: CEA: 4.1 ng/mL (ref 0.0–4.7)

## 2016-03-31 ENCOUNTER — Other Ambulatory Visit: Payer: Self-pay | Admitting: Hematology and Oncology

## 2016-03-31 DIAGNOSIS — R1310 Dysphagia, unspecified: Secondary | ICD-10-CM

## 2016-04-01 ENCOUNTER — Ambulatory Visit: Payer: Medicaid Other | Admitting: Speech Pathology

## 2016-04-04 ENCOUNTER — Ambulatory Visit: Admission: RE | Admit: 2016-04-04 | Payer: Medicaid Other | Source: Ambulatory Visit

## 2016-04-08 ENCOUNTER — Ambulatory Visit: Admission: RE | Admit: 2016-04-08 | Payer: Medicaid Other | Source: Ambulatory Visit

## 2016-04-17 ENCOUNTER — Inpatient Hospital Stay (HOSPITAL_BASED_OUTPATIENT_CLINIC_OR_DEPARTMENT_OTHER): Payer: Medicaid Other | Admitting: Hematology and Oncology

## 2016-04-17 ENCOUNTER — Inpatient Hospital Stay: Payer: Medicaid Other

## 2016-04-17 ENCOUNTER — Other Ambulatory Visit: Payer: Self-pay | Admitting: Hematology and Oncology

## 2016-04-17 VITALS — BP 138/96 | HR 125 | Temp 99.5°F | Resp 20 | Wt 115.5 lb

## 2016-04-17 DIAGNOSIS — R1310 Dysphagia, unspecified: Secondary | ICD-10-CM

## 2016-04-17 DIAGNOSIS — Z923 Personal history of irradiation: Secondary | ICD-10-CM

## 2016-04-17 DIAGNOSIS — R0789 Other chest pain: Secondary | ICD-10-CM

## 2016-04-17 DIAGNOSIS — C770 Secondary and unspecified malignant neoplasm of lymph nodes of head, face and neck: Secondary | ICD-10-CM

## 2016-04-17 DIAGNOSIS — Z79899 Other long term (current) drug therapy: Secondary | ICD-10-CM

## 2016-04-17 DIAGNOSIS — G8929 Other chronic pain: Secondary | ICD-10-CM

## 2016-04-17 DIAGNOSIS — R634 Abnormal weight loss: Secondary | ICD-10-CM

## 2016-04-17 DIAGNOSIS — I10 Essential (primary) hypertension: Secondary | ICD-10-CM

## 2016-04-17 DIAGNOSIS — R490 Dysphonia: Secondary | ICD-10-CM

## 2016-04-17 DIAGNOSIS — C3490 Malignant neoplasm of unspecified part of unspecified bronchus or lung: Secondary | ICD-10-CM

## 2016-04-17 DIAGNOSIS — C3402 Malignant neoplasm of left main bronchus: Secondary | ICD-10-CM

## 2016-04-17 DIAGNOSIS — R0602 Shortness of breath: Secondary | ICD-10-CM

## 2016-04-17 DIAGNOSIS — Z5111 Encounter for antineoplastic chemotherapy: Secondary | ICD-10-CM | POA: Diagnosis not present

## 2016-04-17 DIAGNOSIS — F17210 Nicotine dependence, cigarettes, uncomplicated: Secondary | ICD-10-CM

## 2016-04-17 LAB — COMPREHENSIVE METABOLIC PANEL
ALT: 18 U/L (ref 14–54)
AST: 39 U/L (ref 15–41)
Albumin: 3.8 g/dL (ref 3.5–5.0)
Alkaline Phosphatase: 52 U/L (ref 38–126)
Anion gap: 9 (ref 5–15)
BUN: 8 mg/dL (ref 6–20)
CO2: 28 mmol/L (ref 22–32)
Calcium: 9.8 mg/dL (ref 8.9–10.3)
Chloride: 102 mmol/L (ref 101–111)
Creatinine, Ser: 0.64 mg/dL (ref 0.44–1.00)
GFR calc Af Amer: >60 mL/min (ref >60)
GFR calc non Af Amer: >60 mL/min (ref >60)
Glucose, Bld: 130 mg/dL — ABNORMAL HIGH (ref 65–99)
Potassium: 3.5 mmol/L (ref 3.5–5.1)
Sodium: 139 mmol/L (ref 135–145)
Total Bilirubin: 0.6 mg/dL (ref 0.3–1.2)
Total Protein: 7.5 g/dL (ref 6.5–8.1)

## 2016-04-17 LAB — CBC WITH DIFFERENTIAL/PLATELET
Basophils Absolute: 0.2 10*3/uL — ABNORMAL HIGH (ref 0–0.1)
Basophils Relative: 2 %
Eosinophils Absolute: 0 10*3/uL (ref 0–0.7)
Eosinophils Relative: 0 %
HCT: 39.6 % (ref 35.0–47.0)
Hemoglobin: 13.6 g/dL (ref 12.0–16.0)
Lymphocytes Relative: 14 %
Lymphs Abs: 1.2 10*3/uL (ref 1.0–3.6)
MCH: 30.2 pg (ref 26.0–34.0)
MCHC: 34.4 g/dL (ref 32.0–36.0)
MCV: 87.6 fL (ref 80.0–100.0)
Monocytes Absolute: 1.2 10*3/uL — ABNORMAL HIGH (ref 0.2–0.9)
Monocytes Relative: 14 %
Neutro Abs: 5.8 10*3/uL (ref 1.4–6.5)
Neutrophils Relative %: 70 %
Platelets: 324 10*3/uL (ref 150–440)
RBC: 4.52 MIL/uL (ref 3.80–5.20)
RDW: 15.9 % — ABNORMAL HIGH (ref 11.5–14.5)
WBC: 8.3 10*3/uL (ref 3.6–11.0)

## 2016-04-17 LAB — MAGNESIUM: Magnesium: 1.5 mg/dL — ABNORMAL LOW (ref 1.7–2.4)

## 2016-04-17 MED ORDER — SODIUM CHLORIDE 0.9 % IV SOLN
500.0000 mg/m2 | Freq: Once | INTRAVENOUS | Status: AC
  Administered 2016-04-17: 750 mg via INTRAVENOUS
  Filled 2016-04-17: qty 24

## 2016-04-17 MED ORDER — MAGNESIUM OXIDE 400 MG PO TABS: 400.0000 mg | ORAL_TABLET | Freq: Every day | ORAL | Status: DC

## 2016-04-17 MED ORDER — MEGESTROL ACETATE 40 MG/ML PO SUSP: 200.0000 mg | Freq: Two times a day (BID) | ORAL | Status: DC

## 2016-04-17 MED ORDER — OXYCODONE HCL 10 MG PO TABS: 10.0000 mg | ORAL_TABLET | ORAL | Status: DC | PRN

## 2016-04-17 MED ORDER — SODIUM CHLORIDE 0.9 % IV SOLN
Freq: Once | INTRAVENOUS | Status: AC
  Administered 2016-04-17: 15:00:00 via INTRAVENOUS
  Filled 2016-04-17: qty 4

## 2016-04-17 MED ORDER — OMEPRAZOLE 20 MG PO CPDR: 20.0000 mg | DELAYED_RELEASE_CAPSULE | Freq: Every day | ORAL | Status: DC

## 2016-04-17 MED ORDER — SODIUM CHLORIDE 0.9 % IV SOLN
Freq: Once | INTRAVENOUS | Status: AC
  Administered 2016-04-17: 15:00:00 via INTRAVENOUS
  Filled 2016-04-17: qty 1000

## 2016-04-18 LAB — CEA: CEA: 4.2 ng/mL (ref 0.0–4.7)

## 2016-04-19 ENCOUNTER — Encounter: Payer: Self-pay | Admitting: Hematology and Oncology

## 2016-04-19 NOTE — Progress Notes (Signed)
[facility] day:  04/17/2016  Chief Complaint: Audrey Mcgee is a 55 y.o. female with metastatic poorly differentiated non-small cell lung cancer who is seen for assessment prior to cycle #2 maintenance Alimta.[name]  HPI: The patient was last seen in the medical oncology clinic on 03/28/2016.  At that time, she was persistent hoarseness and swallowing difficulties.  ENT and speech therapy consults were rescheduled.  She had mild hypomagnesemia.  She received cycle #1 Alimta.  She received IV magnesium.  Head CT was rescheduled again.  She continues to have swallowing difficulties.  Her hoarseness has slightly improved.  She is drinking 2 Ensure a day.  She has chronic pain under her right shoulder/upper back.  She took her Decadron yesterday.  She takes her folic acid daily.   Past Medical History  Diagnosis Date  . Hypertension   . Lung cancer [facility])     Past Surgical History  Procedure Laterality Date  . Stomach surgery      Pt reports for a tumor    Family History  Problem Relation Age of Onset  . Hypertension Father   . Cancer Father   . Cancer Maternal Aunt     Social History:  reports that she has been smoking Cigarettes.  She has smoked for the past 15 years. She has never used smokeless tobacco. She reports that she drinks about 1.2 oz of alcohol per week. She reports that she does not use illicit drugs.  Her nephew shot in [place] (caused delays in her treatment).  Her boyfriend's father  died of metastatic lung cancer.  Her boyfriend works at a school with children.  The patient's 1st cousin died recently (age 3) of a cerebral aneurysm.  The patient is alone today.    Allergies:  Allergies  Allergen Reactions  . No Known Allergies     Current Medications: Current Outpatient Prescriptions  Medication Sig Dispense Refill  . benzonatate (TESSALON) 100 MG capsule Take 1 capsule (100 mg total) by mouth 3 (three) times  daily as needed for cough. 20 capsule 0  . dexamethasone (DECADRON) 4 MG tablet Take 55m BID starting day before treatment for a total of 3 days. 20 tablet 0  . DIPHENHYD-LIDOCAINE-NYSTATIN MT Use as directed 1 Dose in the mouth or throat once a week.     . docusate sodium (COLACE) 100 MG capsule Take 100 mg by mouth 2 (two) times daily as needed for mild constipation.    . folic acid (FOLVITE) 8810MCG tablet Take 400 mcg by mouth daily.    .Marland Kitchenlidocaine (XYLOCAINE) 2 % solution use 10 milliliters by mouth every 4 hours if needed  0  . losartan (COZAAR) 100 MG tablet Take 1 tablet (100 mg total) by mouth daily. 30 tablet 0  . magic mouthwash w/lidocaine SOLN Take 5 mLs by mouth 4 (four) times daily as needed for mouth pain. 480 mL 1  . megestrol (MEGACE) 40 MG/ML suspension Take 5 mLs (200 mg total) by mouth 2 (two) times daily. 300 mL 1  . morphine (MS CONTIN) 15 MG 12 hr tablet Take 1 tablet (15 mg total) by mouth 2 (two) times daily. 60 tablet 0  . omeprazole (PRILOSEC) 20 MG capsule Take 1 capsule (20 mg total) by mouth daily. 30 capsule 3  . ondansetron (ZOFRAN) 8 MG tablet Take 1 tablet (8 mg total) by mouth 2 (two) times daily as needed for nausea or vomiting. 20 tablet  2  . Oxycodone HCl 10 MG TABS Take 1 tablet (10 mg total) by mouth every 4 (four) hours as needed (pain). 60 tablet 0  . magnesium oxide (MAG-OX) 400 MG tablet Take 1 tablet (400 mg total) by mouth daily. 30 tablet 0   No current facility-administered medications for this visit.    Review of Systems:  GENERAL: Feels "ok". No fevers, sweats or weight loss. Weight up 2 pounds. PERFORMANCE STATUS (ECOG): 1 HEENT: Chronic hoarseness, slightly improved.  Visual changes.  No sore throat, mouth sores or tenderness. Lungs:  Shortness of breath with exertion.  No cough.  No hemoptysis. Cardiac: No chest pain, palpitations, orthopnea, or PND. GI: Trouble swallowing.  No nausea, vomiting, diarrhea, constipation, melena or  hematochezia. GU: No urgency, frequency, dysuria, or hematuria. Musculoskeletal: Back pain under right shoulder. No joint pain. No muscle tenderness. Extremities: No pain or swelling. Skin: No rashes or skin changes. Neuro: Headache, intermittent.  No numbness or weakness, balance or coordination issues. Endocrine: No diabetes, thyroid issues, hot flashes or night sweats. Psych: No mood changes, depression or anxiety. Pain: Pain in shoulder (stable). Review of systems: All other systems reviewed and found to be negative.  Physical Exam: Blood pressure 138/96, pulse 125, temperature 99.5 F (37.5 C), temperature source Tympanic, resp. rate 20, weight 115 lb 8.3 oz (52.4 kg).  GENERAL: Thin woman sitting comfortably in the exam room in no acute distress. MENTAL STATUS: Alert and oriented to person, place and time. HEAD: Short brown wig with highlights. Normocephalic, atraumatic, face symmetric, no Cushingoid features. EYES: Brown eyes. Ruddy sclera.  Pupils equal round and reactive to light and accomodation. No conjunctivitis or scleral icterus. ENT: Hoarse, but improved.  Oropharynx clear without lesion. Tongue normal. Mucous membranes moist.  RESPIRATORY: No rales, wheezes or rhonchi. CARDIOVASCULAR: Regular rate and rhythm without murmur, rub or gallop. ABDOMEN: Soft, non-tender, with active bowel sounds, and no hepatosplenomegaly. No masses. SKIN: No rashes, ulcers or lesions. EXTREMITIES: No edema, no skin discoloration or tenderness. No palpable cords. LYMPH NODES: No palpable cervical, supraclavicular, axillary or inguinal adenopathy  NEUROLOGICAL: Unremarkable. PSYCH: Appropriate.  Appointment on 04/17/2016  Component Date Value Ref Range Status  . WBC 04/17/2016 8.3  3.6 - 11.0 K/uL Final  . RBC 04/17/2016 4.52  3.80 - 5.20 MIL/uL Final  . Hemoglobin 04/17/2016 13.6  12.0 - 16.0 g/dL Final  . HCT 04/17/2016 39.6  35.0 - 47.0 % Final  . MCV  04/17/2016 87.6  80.0 - 100.0 fL Final  . MCH 04/17/2016 30.2  26.0 - 34.0 pg Final  . MCHC 04/17/2016 34.4  32.0 - 36.0 g/dL Final  . RDW 04/17/2016 15.9* 11.5 - 14.5 % Final  . Platelets 04/17/2016 324  150 - 440 K/uL Final  . Neutrophils Relative % 04/17/2016 70   Final  . Neutro Abs 04/17/2016 5.8  1.4 - 6.5 K/uL Final  . Lymphocytes Relative 04/17/2016 14   Final  . Lymphs Abs 04/17/2016 1.2  1.0 - 3.6 K/uL Final  . Monocytes Relative 04/17/2016 14   Final  . Monocytes Absolute 04/17/2016 1.2* 0.2 - 0.9 K/uL Final  . Eosinophils Relative 04/17/2016 0   Final  . Eosinophils Absolute 04/17/2016 0.0  0 - 0.7 K/uL Final  . Basophils Relative 04/17/2016 2   Final  . Basophils Absolute 04/17/2016 0.2* 0 - 0.1 K/uL Final  . Sodium 04/17/2016 139  135 - 145 mmol/L Final  . Potassium 04/17/2016 3.5  3.5 - 5.1 mmol/L Final  .  Chloride 04/17/2016 102  101 - 111 mmol/L Final  . CO2 04/17/2016 28  22 - 32 mmol/L Final  . Glucose, Bld 04/17/2016 130* 65 - 99 mg/dL Final  . BUN 04/17/2016 8  6 - 20 mg/dL Final  . Creatinine, Ser 04/17/2016 0.64  0.44 - 1.00 mg/dL Final  . Calcium 04/17/2016 9.8  8.9 - 10.3 mg/dL Final  . Total Protein 04/17/2016 7.5  6.5 - 8.1 g/dL Final  . Albumin 04/17/2016 3.8  3.5 - 5.0 g/dL Final  . AST 04/17/2016 39  15 - 41 U/L Final  . ALT 04/17/2016 18  14 - 54 U/L Final  . Alkaline Phosphatase 04/17/2016 52  38 - 126 U/L Final  . Total Bilirubin 04/17/2016 0.6  0.3 - 1.2 mg/dL Final  . GFR calc non Af Amer 04/17/2016 >60  >60 mL/min Final  . GFR calc Af Amer 04/17/2016 >60  >60 mL/min Final   Comment: (NOTE) The eGFR has been calculated using the CKD EPI equation. This calculation has not been validated in all clinical situations. eGFR's persistently <60 mL/min signify possible Chronic Kidney Disease.   . Anion gap 04/17/2016 9  5 - 15 Final  . Magnesium 04/17/2016 1.5* 1.7 - 2.4 mg/dL Final  . CEA 04/17/2016 4.2  0.0 - 4.7 ng/mL Final   Comment: (NOTE)        Roche ECLIA methodology       Nonsmokers  <3.9                                     Smokers     <5.6 Performed At: [facility] [address] [place], [place] [id number] Lindon Romp MD KD:[id number]     Assessment:  Decklyn Hornik is a 55 y.o. female with metastatic poorly differentiated adenocarcinoma of lung. She presented with a 5 month history of enlarging bilateral neck masses, progressive hoarseness, dysphagia, dyspnea on exertion, cough, left sided hearing loss, neck pain, and a 30 pound weight loss.  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 Biopsy of the right supraclavicular lymph node on 10/03/2014 revealed poorly differentiated non-small cell carcinoma, favoring adenocarcinoma consistent with lung primary (TTF-1 positive). KRAS was positive.   PET scan on 10/24/2014 revealed a left hilar mass with associated left upper lobe obstruction/atelectasis with extensive bilateral mediastinal and cervical adnopathy. Chest CT from 10/10/2014 also noted a 2.2 x 0.9 cm soft tissue density with internal calcifications in the right breast. Head MRI revealed no evidence of metastatic disease.  She received palliative radiation (3000 cGy) to the left lung from 10/24/2014 - 11/07/2014. She has received 3 cycles of carboplatin and Alimta (11/14/2014, 12/05/2014, and 02/13/2015). Last B12 was given on 04/09/2015.   She has pain in her left chest, throat, and shoulder. She is on oxycodone 10 mg every 6 hours as needed. Morphine causes nausea. She has chronic shortness of breath with exertion.  She received 6 cycles carboplatin and Alimta (11/14/2014 - 04/30/2015). There was a delay between cycle #2 and cycle #3 secondary to a switch in caregivers.  She was diagnosed with a left peri-hilar infiltrate on 02/23/2015. She has completed a course of Levaquin. Chest CT on 03/21/2015 revealed LUL scarring, patchy ill defined airspace  opacities in the lingula, upper esophageal thickening, and small mediastinal and hilar nodes. She completed a course of Azithromycin.  She received 2 cycles of maintenance  Alimta (05/22/2015 - 06/15/2015). Chest CT on 07/09/2015 revealed patchy ground glass and nodular consolidation in the left upper lobe and to a lesser extent left lower lobe, minimally progressive from 03/21/2015.  Bone scan on 07/09/2015 revealed no evidence of metastatic disease.  CXR on 09/24/2015 revealed patchy left perihilar airspace opacities.  She was treated with azithromycin.  Chest CT on 10/17/2015 revealed patchy ground-glass and ill-defined nodular areas of opacity in the left apex, lingula, and anterior left lower lobe generally appeared decreased in the interval although 1 of the nodular areas in the left apex has progressed slightly since the previous study.  Overall imaging features are felt to be most likely treatment related.  There had been an interval slight decrease in proximal to mid esophageal circumferential wall thickening, potentially treatment related.   She was lost to follow-up for approximately 3 months.  Chest and abdomen CT scan on 02/04/2016 revealed evolving radiation changes in the left upper and left lower lobes. Previously measured left upper lobe nodule was no longer visualized. There was no evidence of metastatic disease.  Right hilar lymph nodes were not considered enlarged by CT size criteria and are nonspecific, but appeared new from the prior exam.  Bone scan on 02/19/2016 revealed no definite evidence of metastatic disease. There was subtle uptake within the posterior aspects of the upper ribs bilaterally (nonspecific and not entirely new).  There was a tiny focus of increased uptake near the vertex of the calvarium (new).  She has been hoarse since 02/01/2016.  She is s/p 2 cycles of maintenance Alimta (restarted 02/29/2016 - 03/28/2016).  She receives B12 every 9 weeks (last 03/28/2016) and  is on daily folic acid.  She is tolerated her chemotherapy well. She has mild nausea.  Symptomatically, her hoarseness has improved slightly.  She has difficulty swallowing.  She has not had her ENT or swallowing evaluations.  She has mild hypomagnesemia (1.6).  Plan: 1.  Labs today:  CBC with diff, CMP, Mg, CEA. 2.  Cycle #3 Alimta today. 3.  Rx:  magnesium oxide 400 mg a day. 4.  Refill:  Megace, oxycodone, omeprazole. 5.  Follow-up ENT and speech therapy consults. 6.  Reschedule head CT next week. 7.  RTC in 3 weeks for MD assessment, labs (CBC with diff, CMP, Mg), and cycle #4 Alimta.   Lequita Asal, MD  04/17/2016

## 2016-04-25 ENCOUNTER — Ambulatory Visit: Admission: RE | Admit: 2016-04-25 | Payer: Medicaid Other | Source: Ambulatory Visit

## 2016-05-08 ENCOUNTER — Inpatient Hospital Stay (HOSPITAL_BASED_OUTPATIENT_CLINIC_OR_DEPARTMENT_OTHER): Payer: Medicaid Other | Admitting: Hematology and Oncology

## 2016-05-08 ENCOUNTER — Inpatient Hospital Stay: Payer: Medicaid Other

## 2016-05-08 ENCOUNTER — Inpatient Hospital Stay: Payer: Medicaid Other | Attending: Hematology and Oncology

## 2016-05-08 ENCOUNTER — Other Ambulatory Visit: Payer: Self-pay | Admitting: Hematology and Oncology

## 2016-05-08 VITALS — BP 133/86 | HR 116 | Temp 95.5°F | Resp 18 | Ht 62.0 in | Wt 117.4 lb

## 2016-05-08 DIAGNOSIS — R1310 Dysphagia, unspecified: Secondary | ICD-10-CM | POA: Insufficient documentation

## 2016-05-08 DIAGNOSIS — C3490 Malignant neoplasm of unspecified part of unspecified bronchus or lung: Secondary | ICD-10-CM

## 2016-05-08 DIAGNOSIS — K047 Periapical abscess without sinus: Secondary | ICD-10-CM | POA: Diagnosis not present

## 2016-05-08 DIAGNOSIS — R070 Pain in throat: Secondary | ICD-10-CM | POA: Diagnosis not present

## 2016-05-08 DIAGNOSIS — N63 Unspecified lump in breast: Secondary | ICD-10-CM | POA: Insufficient documentation

## 2016-05-08 DIAGNOSIS — M25519 Pain in unspecified shoulder: Secondary | ICD-10-CM | POA: Insufficient documentation

## 2016-05-08 DIAGNOSIS — R079 Chest pain, unspecified: Secondary | ICD-10-CM | POA: Diagnosis not present

## 2016-05-08 DIAGNOSIS — Z9221 Personal history of antineoplastic chemotherapy: Secondary | ICD-10-CM

## 2016-05-08 DIAGNOSIS — R0602 Shortness of breath: Secondary | ICD-10-CM

## 2016-05-08 DIAGNOSIS — F17210 Nicotine dependence, cigarettes, uncomplicated: Secondary | ICD-10-CM | POA: Diagnosis not present

## 2016-05-08 DIAGNOSIS — I10 Essential (primary) hypertension: Secondary | ICD-10-CM | POA: Diagnosis not present

## 2016-05-08 DIAGNOSIS — R490 Dysphonia: Secondary | ICD-10-CM

## 2016-05-08 DIAGNOSIS — C3412 Malignant neoplasm of upper lobe, left bronchus or lung: Secondary | ICD-10-CM | POA: Diagnosis present

## 2016-05-08 DIAGNOSIS — Z809 Family history of malignant neoplasm, unspecified: Secondary | ICD-10-CM | POA: Insufficient documentation

## 2016-05-08 DIAGNOSIS — Z923 Personal history of irradiation: Secondary | ICD-10-CM | POA: Diagnosis not present

## 2016-05-08 DIAGNOSIS — Z79899 Other long term (current) drug therapy: Secondary | ICD-10-CM

## 2016-05-08 DIAGNOSIS — R599 Enlarged lymph nodes, unspecified: Secondary | ICD-10-CM | POA: Diagnosis not present

## 2016-05-08 DIAGNOSIS — R51 Headache: Secondary | ICD-10-CM | POA: Diagnosis not present

## 2016-05-08 DIAGNOSIS — Z8701 Personal history of pneumonia (recurrent): Secondary | ICD-10-CM

## 2016-05-08 LAB — COMPREHENSIVE METABOLIC PANEL
ALT: 16 U/L (ref 14–54)
AST: 28 U/L (ref 15–41)
Albumin: 3.8 g/dL (ref 3.5–5.0)
Alkaline Phosphatase: 66 U/L (ref 38–126)
Anion gap: 7 (ref 5–15)
BUN: 11 mg/dL (ref 6–20)
CO2: 27 mmol/L (ref 22–32)
Calcium: 9.9 mg/dL (ref 8.9–10.3)
Chloride: 103 mmol/L (ref 101–111)
Creatinine, Ser: 0.5 mg/dL (ref 0.44–1.00)
GFR calc Af Amer: >60 mL/min (ref >60)
GFR calc non Af Amer: >60 mL/min (ref >60)
Glucose, Bld: 131 mg/dL — ABNORMAL HIGH (ref 65–99)
Potassium: 3.9 mmol/L (ref 3.5–5.1)
Sodium: 137 mmol/L (ref 135–145)
Total Bilirubin: 0.4 mg/dL (ref 0.3–1.2)
Total Protein: 7.7 g/dL (ref 6.5–8.1)

## 2016-05-08 LAB — CBC WITH DIFFERENTIAL/PLATELET
Basophils Absolute: 0 10*3/uL (ref 0–0.1)
Basophils Relative: 0 %
Eosinophils Absolute: 0 10*3/uL (ref 0–0.7)
Eosinophils Relative: 0 %
HCT: 39.9 % (ref 35.0–47.0)
Hemoglobin: 13.6 g/dL (ref 12.0–16.0)
Lymphocytes Relative: 12 %
Lymphs Abs: 0.9 10*3/uL — ABNORMAL LOW (ref 1.0–3.6)
MCH: 30.6 pg (ref 26.0–34.0)
MCHC: 34 g/dL (ref 32.0–36.0)
MCV: 90.1 fL (ref 80.0–100.0)
Monocytes Absolute: 1.2 10*3/uL — ABNORMAL HIGH (ref 0.2–0.9)
Monocytes Relative: 15 %
Neutro Abs: 5.7 10*3/uL (ref 1.4–6.5)
Neutrophils Relative %: 73 %
Platelets: 337 10*3/uL (ref 150–440)
RBC: 4.43 MIL/uL (ref 3.80–5.20)
RDW: 16 % — ABNORMAL HIGH (ref 11.5–14.5)
WBC: 7.8 10*3/uL (ref 3.6–11.0)

## 2016-05-08 LAB — MAGNESIUM: Magnesium: 1.6 mg/dL — ABNORMAL LOW (ref 1.7–2.4)

## 2016-05-08 MED ORDER — OXYCODONE HCL 10 MG PO TABS: 10.0000 mg | ORAL_TABLET | ORAL | Status: DC | PRN

## 2016-05-08 NOTE — Progress Notes (Signed)
Audrey Mcgee day:  05/08/2016   Chief Complaint: Audrey Mcgee is a 55 y.o. female with metastatic poorly differentiated non-small cell lung cancer who is seen for assessment prior to cycle #4 maintenance Alimta.  HPI: The patient was last seen in the medical oncology [facility] on 04/17/2016.  At that time, she received cycle #3 Alimta.   Symptomatically, her hoarseness had improved slightly.  She had difficulty swallowing.  She had not had her ENT or swallowing evaluations.  She had mild hypomagnesemia (1.6).  Oral magnesium was started.  During the interim, she has had a cough that was initially productive of yellow sputum that has cleared.  She had 2 days of a fever, now resolved.  She notes a "dangling tooth" which is abscessed.  She was put on antibiotics by her dentist.  The tooth should be extracted next week.  She has 2-3 antibiotic pills left.  Her face remains "tender and puffy".  She is taking her oral magnesium.  She has an appointment with ENT  On 05/13/2016.  She believes her swallowing study is next week.  She missed her head CT.  She took her Decadron yesterday.  She takes her folic acid daily.   Past Medical History  Diagnosis Date  . Hypertension   . Lung cancer [facility])     Past Surgical History  Procedure Laterality Date  . Stomach surgery      Pt reports for a tumor    Family History  Problem Relation Age of Onset  . Hypertension Father   . Cancer Father   . Diabetes Father   . Cancer Maternal Aunt     Social History:  reports that she has been smoking Cigarettes.  She has smoked for the past 15 years. She has never used smokeless tobacco. She reports that she drinks about 1.2 oz of alcohol per week. She reports that she does not use illicit drugs.  Her nephew shot in [place] (caused delays in her treatment).  Her boyfriend's father  died of metastatic lung cancer.  Her boyfriend works at a school with children.   The patient's 1st cousin died recently (age 64) of a cerebral aneurysm.  The patient is alone today.    Allergies:  Allergies  Allergen Reactions  . No Known Allergies     Current Medications: Current Outpatient Prescriptions  Medication Sig Dispense Refill  . benzonatate (TESSALON) 100 MG capsule Take 1 capsule (100 mg total) by mouth 3 (three) times daily as needed for cough. 20 capsule 0  . dexamethasone (DECADRON) 4 MG tablet Take 69m BID starting day before treatment for a total of 3 days. 20 tablet 0  . DIPHENHYD-LIDOCAINE-NYSTATIN MT Use as directed 1 Dose in the mouth or throat once a week.     . docusate sodium (COLACE) 100 MG capsule Take 100 mg by mouth 2 (two) times daily as needed for mild constipation.    . folic acid (FOLVITE) 8673MCG tablet Take 400 mcg by mouth daily.    .Marland Kitchenlosartan (COZAAR) 100 MG tablet Take 1 tablet (100 mg total) by mouth daily. 30 tablet 0  . magic mouthwash w/lidocaine SOLN Take 5 mLs by mouth 4 (four) times daily as needed for mouth pain. 480 mL 1  . magnesium oxide (MAG-OX) 400 MG tablet Take 1 tablet (400 mg total) by mouth daily. 30 tablet 0  . megestrol (MEGACE) 40 MG/ML suspension Take 5 mLs (200 mg total) by  mouth 2 (two) times daily. 300 mL 1  . morphine (MS CONTIN) 15 MG 12 hr tablet Take 1 tablet (15 mg total) by mouth 2 (two) times daily. 60 tablet 0  . omeprazole (PRILOSEC) 20 MG capsule Take 1 capsule (20 mg total) by mouth daily. 30 capsule 3  . ondansetron (ZOFRAN) 8 MG tablet Take 1 tablet (8 mg total) by mouth 2 (two) times daily as needed for nausea or vomiting. 20 tablet 2  . Oxycodone HCl 10 MG TABS Take 1 tablet (10 mg total) by mouth every 4 (four) hours as needed (pain). 60 tablet 0  . lidocaine (XYLOCAINE) 2 % solution use 10 milliliters by mouth every 4 hours if needed  0   No current facility-administered medications for this visit.    Review of Systems:  GENERAL: Feels better today, bad yesterday. Fever resolved.  No  sweats or weight loss. Weight up 2 pounds. PERFORMANCE STATUS (ECOG): 1 HEENT: Chronic hoarseness, slightly improved.  Left upper tooth abscess.  Visual changes.  Runny nose.  No sore throat, mouth sores or tenderness. Lungs:  Shortness of breath with exertion.  Clear cough.  No hemoptysis. Cardiac: No chest pain, palpitations, orthopnea, or PND. GI: Trouble swallowing.  No nausea, vomiting, diarrhea, constipation, melena or hematochezia. GU: No urgency, frequency, dysuria, or hematuria. Musculoskeletal: Back pain under right shoulder. No joint pain. No muscle tenderness. Extremities: No pain or swelling. Skin: No rashes or skin changes. Neuro: Headache, intermittent.  No numbness or weakness, balance or coordination issues. Endocrine: No diabetes, thyroid issues, hot flashes or night sweats. Psych: No mood changes, depression or anxiety. Pain: Pain in shoulder (stable). Review of systems: All other systems reviewed and found to be negative.  Physical Exam: Blood pressure 133/86, pulse 116, temperature 95.5 F (35.3 C), temperature source Tympanic, resp. rate 18, height _0  (1.575 m), weight 117 lb 6.3 oz (53.25 kg), SpO2 100 %.  GENERAL: Thin woman sitting comfortably in the exam room in no acute distress. MENTAL STATUS: Alert and oriented to person, place and time. HEAD: Short brown wig with highlights. Normocephalic, atraumatic, face symmetric, no Cushingoid features.  Left cheek edematous and tender. EYES: Brown eyes. Ruddy sclera.  Pupils equal round and reactive to light and accomodation. No conjunctivitis or scleral icterus. ENT: Hoarse.  Left upper tooth dangling.  No purulence seen.  Tender to tap.  Oropharynx clear without lesion. Tongue normal. Mucous membranes moist.  RESPIRATORY: Clear to auscultation without rales, wheezes or rhonchi. CARDIOVASCULAR: Regular rate and rhythm without murmur, rub or gallop. ABDOMEN: Soft, non-tender, with active  bowel sounds, and no hepatosplenomegaly. No masses. SKIN: No rashes, ulcers or lesions. EXTREMITIES: No edema, no skin discoloration or tenderness. No palpable cords. LYMPH NODES: No palpable cervical, supraclavicular, axillary or inguinal adenopathy  NEUROLOGICAL: Unremarkable. PSYCH: Appropriate.  Appointment on 05/08/2016  Component Date Value Ref Range Status  . WBC 05/08/2016 7.8  3.6 - 11.0 K/uL Final  . RBC 05/08/2016 4.43  3.80 - 5.20 MIL/uL Final  . Hemoglobin 05/08/2016 13.6  12.0 - 16.0 g/dL Final  . HCT 05/08/2016 39.9  35.0 - 47.0 % Final  . MCV 05/08/2016 90.1  80.0 - 100.0 fL Final  . MCH 05/08/2016 30.6  26.0 - 34.0 pg Final  . MCHC 05/08/2016 34.0  32.0 - 36.0 g/dL Final  . RDW 05/08/2016 16.0* 11.5 - 14.5 % Final  . Platelets 05/08/2016 337  150 - 440 K/uL Final  . Neutrophils Relative % 05/08/2016 73  Final  . Neutro Abs 05/08/2016 5.7  1.4 - 6.5 K/uL Final  . Lymphocytes Relative 05/08/2016 12   Final  . Lymphs Abs 05/08/2016 0.9* 1.0 - 3.6 K/uL Final  . Monocytes Relative 05/08/2016 15   Final  . Monocytes Absolute 05/08/2016 1.2* 0.2 - 0.9 K/uL Final  . Eosinophils Relative 05/08/2016 0   Final  . Eosinophils Absolute 05/08/2016 0.0  0 - 0.7 K/uL Final  . Basophils Relative 05/08/2016 0   Final  . Basophils Absolute 05/08/2016 0.0  0 - 0.1 K/uL Final  . Sodium 05/08/2016 137  135 - 145 mmol/L Final  . Potassium 05/08/2016 3.9  3.5 - 5.1 mmol/L Final  . Chloride 05/08/2016 103  101 - 111 mmol/L Final  . CO2 05/08/2016 27  22 - 32 mmol/L Final  . Glucose, Bld 05/08/2016 131* 65 - 99 mg/dL Final  . BUN 05/08/2016 11  6 - 20 mg/dL Final  . Creatinine, Ser 05/08/2016 0.50  0.44 - 1.00 mg/dL Final  . Calcium 05/08/2016 9.9  8.9 - 10.3 mg/dL Final  . Total Protein 05/08/2016 7.7  6.5 - 8.1 g/dL Final  . Albumin 05/08/2016 3.8  3.5 - 5.0 g/dL Final  . AST 05/08/2016 28  15 - 41 U/L Final  . ALT 05/08/2016 16  14 - 54 U/L Final  . Alkaline Phosphatase  05/08/2016 66  38 - 126 U/L Final  . Total Bilirubin 05/08/2016 0.4  0.3 - 1.2 mg/dL Final  . GFR calc non Af Amer 05/08/2016 >60  >60 mL/min Final  . GFR calc Af Amer 05/08/2016 >60  >60 mL/min Final   Comment: (NOTE) The eGFR has been calculated using the CKD EPI equation. This calculation has not been validated in all clinical situations. eGFR's persistently <60 mL/min signify possible Chronic Kidney Disease.   . Anion gap 05/08/2016 7  5 - 15 Final  . Magnesium 05/08/2016 1.6* 1.7 - 2.4 mg/dL Final    Assessment:  Kanai Hilger is a 55 y.o. female with metastatic poorly differentiated adenocarcinoma of lung. She presented with a 5 month history of enlarging bilateral neck masses, progressive hoarseness, dysphagia, dyspnea on exertion, cough, left sided hearing loss, neck pain, and a 30 pound weight loss.  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 Biopsy of the right supraclavicular lymph node on 10/03/2014 revealed poorly differentiated non-small cell carcinoma, favoring adenocarcinoma consistent with lung primary (TTF-1 positive). KRAS was positive.   PET scan on 10/24/2014 revealed a left hilar mass with associated left upper lobe obstruction/atelectasis with extensive bilateral mediastinal and cervical adnopathy. Chest CT from 10/10/2014 also noted a 2.2 x 0.9 cm soft tissue density with internal calcifications in the right breast. Head MRI revealed no evidence of metastatic disease.  She received palliative radiation (3000 cGy) to the left lung from 10/24/2014 - 11/07/2014. She has received 3 cycles of carboplatin and Alimta (11/14/2014, 12/05/2014, and 02/13/2015). Last B12 was given on 04/09/2015.   She has pain in her left chest, throat, and shoulder. She is on oxycodone 10 mg every 6 hours as needed. Morphine causes nausea. She has chronic shortness of breath with exertion.  She received 6 cycles carboplatin and Alimta  (11/14/2014 - 04/30/2015). There was a delay between cycle #2 and cycle #3 secondary to a switch in caregivers.  She was diagnosed with a left peri-hilar infiltrate on 02/23/2015. She has completed a course of Levaquin. Chest CT on 03/21/2015 revealed LUL scarring, patchy ill defined  airspace opacities in the lingula, upper esophageal thickening, and small mediastinal and hilar nodes. She completed a course of Azithromycin.  She received 2 cycles of maintenance Alimta (05/22/2015 - 06/15/2015). Chest CT on 07/09/2015 revealed patchy ground glass and nodular consolidation in the left upper lobe and to a lesser extent left lower lobe, minimally progressive from 03/21/2015.  Bone scan on 07/09/2015 revealed no evidence of metastatic disease.  CXR on 09/24/2015 revealed patchy left perihilar airspace opacities.  She was treated with azithromycin.  Chest CT on 10/17/2015 revealed patchy ground-glass and ill-defined nodular areas of opacity in the left apex, lingula, and anterior left lower lobe generally appeared decreased in the interval although 1 of the nodular areas in the left apex has progressed slightly since the previous study.  Overall imaging features are felt to be most likely treatment related.  There had been an interval slight decrease in proximal to mid esophageal circumferential wall thickening, potentially treatment related.   She was lost to follow-up for approximately 3 months.  Chest and abdomen CT scan on 02/04/2016 revealed evolving radiation changes in the left upper and left lower lobes. Previously measured left upper lobe nodule was no longer visualized. There was no evidence of metastatic disease.  Right hilar lymph nodes were not considered enlarged by CT size criteria and are nonspecific, but appeared new from the prior exam.  Bone scan on 02/19/2016 revealed no definite evidence of metastatic disease. There was subtle uptake within the posterior aspects of the upper ribs  bilaterally (nonspecific and not entirely new).  There was a tiny focus of increased uptake near the vertex of the calvarium (new).  She has been hoarse since 02/01/2016.  She has received 3 cycles of maintenance Alimta (restarted 02/29/2016 - 04/17/2016).  She receives B12 every 9 weeks (last 03/28/2016) and is on daily folic acid.  She is tolerated her chemotherapy well.   She has a tooth abscess on antibiotics.  She has ongoing hoarseness and difficulty swallowing.  She has not had her ENT or swallowing evaluations.  She has stable mild hypomagnesemia (1.6).  She has mild visual changes and intermittent headaches.  Plan: 1.  Labs today:  CBC with diff, CMP, Mg. 2.  Postpone chemotherapy today. 3.  Continue magnesium oxide 400 mg a day. 4.  Refill:  Megace, oxycodone, omeprazole. 5.  Follow-up ENT and speech therapy consults. 6.  Reschedule head CT next week re: hoarseness, swallowing difficulty, visual changes, and intermittent headaches. 7.  Patient to follow-up with dentistry for additional antibiotics prior to tooth extraction next week. 8.  RTC 05/16/2016 for MD assessment, labs (CBC with diff, CMP, Mg), and cycle #4 Alimta.   Lequita Asal, MD  05/08/2016, 11:17 AM

## 2016-05-08 NOTE — Progress Notes (Signed)
Pt reports she currently has a bad cold and cough.  Pt has a tooth in the upper backside side of mouth that is painful and an abcess.  Currently on antibiotics and cannot have tooth pulled until abcess is gone.  Pt would also like refill on megace.  Reports appetite has decreased more with tooth pain and cold like symptom.  Did not do CT because she was sick and tooth.  Pt stated she need Lattie Haw to reschedule CT .  Pt stated she will get scope done on 23rd

## 2016-05-10 ENCOUNTER — Encounter: Payer: Self-pay | Admitting: Hematology and Oncology

## 2016-05-10 DIAGNOSIS — K047 Periapical abscess without sinus: Secondary | ICD-10-CM | POA: Insufficient documentation

## 2016-05-15 ENCOUNTER — Other Ambulatory Visit: Payer: Self-pay | Admitting: Hematology and Oncology

## 2016-05-16 ENCOUNTER — Inpatient Hospital Stay: Payer: Medicaid Other | Admitting: Hematology and Oncology

## 2016-05-16 ENCOUNTER — Inpatient Hospital Stay: Payer: Medicaid Other

## 2016-05-18 ENCOUNTER — Other Ambulatory Visit: Payer: Self-pay | Admitting: Hematology and Oncology

## 2016-05-20 ENCOUNTER — Ambulatory Visit: Payer: Medicaid Other

## 2016-05-23 ENCOUNTER — Inpatient Hospital Stay: Payer: Medicaid Other | Attending: Hematology and Oncology

## 2016-05-23 ENCOUNTER — Inpatient Hospital Stay: Payer: Medicaid Other | Admitting: Family Medicine

## 2016-05-23 ENCOUNTER — Other Ambulatory Visit: Payer: Self-pay | Admitting: *Deleted

## 2016-05-23 ENCOUNTER — Inpatient Hospital Stay: Payer: Medicaid Other

## 2016-05-23 ENCOUNTER — Ambulatory Visit: Admission: RE | Admit: 2016-05-23 | Payer: Medicaid Other | Source: Ambulatory Visit

## 2016-05-23 DIAGNOSIS — C3492 Malignant neoplasm of unspecified part of left bronchus or lung: Secondary | ICD-10-CM | POA: Insufficient documentation

## 2016-05-23 DIAGNOSIS — R490 Dysphonia: Secondary | ICD-10-CM | POA: Insufficient documentation

## 2016-05-23 DIAGNOSIS — R0602 Shortness of breath: Secondary | ICD-10-CM | POA: Insufficient documentation

## 2016-05-23 DIAGNOSIS — Z809 Family history of malignant neoplasm, unspecified: Secondary | ICD-10-CM | POA: Insufficient documentation

## 2016-05-23 DIAGNOSIS — Z5111 Encounter for antineoplastic chemotherapy: Secondary | ICD-10-CM | POA: Insufficient documentation

## 2016-05-23 DIAGNOSIS — Z923 Personal history of irradiation: Secondary | ICD-10-CM | POA: Insufficient documentation

## 2016-05-23 DIAGNOSIS — Z79899 Other long term (current) drug therapy: Secondary | ICD-10-CM | POA: Insufficient documentation

## 2016-05-23 DIAGNOSIS — F17210 Nicotine dependence, cigarettes, uncomplicated: Secondary | ICD-10-CM | POA: Insufficient documentation

## 2016-05-23 DIAGNOSIS — Z8701 Personal history of pneumonia (recurrent): Secondary | ICD-10-CM | POA: Insufficient documentation

## 2016-05-23 DIAGNOSIS — I10 Essential (primary) hypertension: Secondary | ICD-10-CM | POA: Insufficient documentation

## 2016-06-06 ENCOUNTER — Other Ambulatory Visit: Payer: Self-pay | Admitting: Hematology and Oncology

## 2016-06-06 ENCOUNTER — Inpatient Hospital Stay (HOSPITAL_BASED_OUTPATIENT_CLINIC_OR_DEPARTMENT_OTHER): Payer: Medicaid Other | Admitting: Family Medicine

## 2016-06-06 ENCOUNTER — Inpatient Hospital Stay: Payer: Medicaid Other | Attending: Family Medicine

## 2016-06-06 ENCOUNTER — Encounter: Payer: Self-pay | Admitting: Family Medicine

## 2016-06-06 ENCOUNTER — Inpatient Hospital Stay: Payer: Medicaid Other

## 2016-06-06 VITALS — BP 145/83 | HR 137 | Temp 97.0°F | Ht 62.0 in | Wt 120.7 lb

## 2016-06-06 VITALS — BP 146/90 | HR 133 | Resp 20

## 2016-06-06 DIAGNOSIS — I10 Essential (primary) hypertension: Secondary | ICD-10-CM | POA: Diagnosis not present

## 2016-06-06 DIAGNOSIS — Z809 Family history of malignant neoplasm, unspecified: Secondary | ICD-10-CM

## 2016-06-06 DIAGNOSIS — R490 Dysphonia: Secondary | ICD-10-CM | POA: Diagnosis not present

## 2016-06-06 DIAGNOSIS — R0602 Shortness of breath: Secondary | ICD-10-CM | POA: Diagnosis not present

## 2016-06-06 DIAGNOSIS — Z8701 Personal history of pneumonia (recurrent): Secondary | ICD-10-CM

## 2016-06-06 DIAGNOSIS — Z79899 Other long term (current) drug therapy: Secondary | ICD-10-CM | POA: Diagnosis not present

## 2016-06-06 DIAGNOSIS — C3490 Malignant neoplasm of unspecified part of unspecified bronchus or lung: Secondary | ICD-10-CM

## 2016-06-06 DIAGNOSIS — Z923 Personal history of irradiation: Secondary | ICD-10-CM

## 2016-06-06 DIAGNOSIS — F17210 Nicotine dependence, cigarettes, uncomplicated: Secondary | ICD-10-CM

## 2016-06-06 DIAGNOSIS — C3492 Malignant neoplasm of unspecified part of left bronchus or lung: Secondary | ICD-10-CM | POA: Insufficient documentation

## 2016-06-06 DIAGNOSIS — Z5111 Encounter for antineoplastic chemotherapy: Secondary | ICD-10-CM | POA: Diagnosis not present

## 2016-06-06 LAB — MAGNESIUM: Magnesium: 1.4 mg/dL — ABNORMAL LOW (ref 1.7–2.4)

## 2016-06-06 LAB — CBC WITH DIFFERENTIAL/PLATELET
Basophils Absolute: 0 10*3/uL (ref 0–0.1)
Basophils Relative: 1 %
Eosinophils Absolute: 0 10*3/uL (ref 0–0.7)
Eosinophils Relative: 0 %
HCT: 42.4 % (ref 35.0–47.0)
Hemoglobin: 14.5 g/dL (ref 12.0–16.0)
Lymphocytes Relative: 13 %
Lymphs Abs: 1.2 10*3/uL (ref 1.0–3.6)
MCH: 31.3 pg (ref 26.0–34.0)
MCHC: 34.2 g/dL (ref 32.0–36.0)
MCV: 91.5 fL (ref 80.0–100.0)
Monocytes Absolute: 1.5 10*3/uL — ABNORMAL HIGH (ref 0.2–0.9)
Monocytes Relative: 17 %
Neutro Abs: 6.1 10*3/uL (ref 1.4–6.5)
Neutrophils Relative %: 69 %
Platelets: 179 10*3/uL (ref 150–440)
RBC: 4.64 MIL/uL (ref 3.80–5.20)
RDW: 13.8 % (ref 11.5–14.5)
WBC: 8.9 10*3/uL (ref 3.6–11.0)

## 2016-06-06 LAB — COMPREHENSIVE METABOLIC PANEL
ALT: 18 U/L (ref 14–54)
AST: 25 U/L (ref 15–41)
Albumin: 4 g/dL (ref 3.5–5.0)
Alkaline Phosphatase: 73 U/L (ref 38–126)
Anion gap: 9 (ref 5–15)
BUN: 8 mg/dL (ref 6–20)
CO2: 25 mmol/L (ref 22–32)
Calcium: 9.6 mg/dL (ref 8.9–10.3)
Chloride: 101 mmol/L (ref 101–111)
Creatinine, Ser: 0.57 mg/dL (ref 0.44–1.00)
GFR calc Af Amer: >60 mL/min (ref >60)
GFR calc non Af Amer: >60 mL/min (ref >60)
Glucose, Bld: 178 mg/dL — ABNORMAL HIGH (ref 65–99)
Potassium: 4.1 mmol/L (ref 3.5–5.1)
Sodium: 135 mmol/L (ref 135–145)
Total Bilirubin: 0.6 mg/dL (ref 0.3–1.2)
Total Protein: 7.6 g/dL (ref 6.5–8.1)

## 2016-06-06 MED ORDER — OXYCODONE HCL 10 MG PO TABS: 10.0000 mg | ORAL_TABLET | ORAL | Status: DC | PRN

## 2016-06-06 MED ORDER — SODIUM CHLORIDE 0.9 % IV SOLN
500.0000 mg/m2 | Freq: Once | INTRAVENOUS | Status: AC
  Administered 2016-06-06: 750 mg via INTRAVENOUS
  Filled 2016-06-06: qty 24

## 2016-06-06 MED ORDER — ONDANSETRON HCL 8 MG PO TABS: 8.0000 mg | ORAL_TABLET | Freq: Two times a day (BID) | ORAL | Status: DC | PRN

## 2016-06-06 MED ORDER — SODIUM CHLORIDE 0.9 % IV SOLN
Freq: Once | INTRAVENOUS | Status: AC
  Administered 2016-06-06: 11:00:00 via INTRAVENOUS
  Filled 2016-06-06: qty 1000

## 2016-06-06 MED ORDER — CYANOCOBALAMIN 1000 MCG/ML IJ SOLN
1000.0000 ug | Freq: Once | INTRAMUSCULAR | Status: AC
  Administered 2016-06-06: 1000 ug via INTRAMUSCULAR
  Filled 2016-06-06: qty 1

## 2016-06-06 MED ORDER — SODIUM CHLORIDE 0.9 % IV SOLN
Freq: Once | INTRAVENOUS | Status: AC
  Administered 2016-06-06: 11:00:00 via INTRAVENOUS
  Filled 2016-06-06: qty 4

## 2016-06-06 NOTE — Progress Notes (Signed)
[facility] day:  06/06/2016   Chief Complaint: Audrey Mcgee is a 55 y.o. female with metastatic poorly differentiated non-small cell lung cancer who is seen for assessment prior to cycle #4 maintenance Alimta.  HPI: The patient was last seen in the medical oncology clinic on 04/17/2016.  At that time, she received cycle #3 Alimta.   Symptomatically, her hoarseness had improved slightly.  She had difficulty swallowing.  She had not had her ENT or swallowing evaluations.  She had mild hypomagnesemia (1.4).  Oral magnesium was started.  Patient has missed the last couple of weeks for treatment due to several abscessed teeth which required extraction as well as antibiotic therapy. All abscessed teeth have been extracted and antibiotics have been completed. She did not go for her CT scan due to getting a letter from her insurance company stating it was denied. Contacted our insurance authorization department here at the cancer center and confirmed that it was approved through June 21. Patient also missed ENT evaluation due to her teeth. She states that she will reschedule her ENT appointment. Also states that she needs to have more dental work done involving several extractions over the coming weeks. Patient reports that she is continuing to take magnesium 1 tablet daily as well as her folic acid daily. She is, however, refusing any IV magnesium today as it makes her feel "very very bad".  Past Medical History  Diagnosis Date  . Hypertension   . Lung cancer [facility])     Past Surgical History  Procedure Laterality Date  . Stomach surgery      Pt reports for a tumor    Family History  Problem Relation Age of Onset  . Hypertension Father   . Cancer Father   . Diabetes Father   . Cancer Maternal Aunt     Social History:  reports that she has been smoking Cigarettes.  She has smoked for the past 15 years. She has never used smokeless tobacco.  She reports that she drinks about 1.2 oz of alcohol per week. She reports that she does not use illicit drugs.  Her nephew shot in [place] (caused delays in her treatment).  Her boyfriend's father  died of metastatic lung cancer.  Her boyfriend works at a school with children.  The patient's 1st cousin died recently (age 62) of a cerebral aneurysm.  The patient is alone today.    Allergies:  Allergies  Allergen Reactions  . No Known Allergies     Current Medications: Current Outpatient Prescriptions  Medication Sig Dispense Refill  . benzonatate (TESSALON) 100 MG capsule Take 1 capsule (100 mg total) by mouth 3 (three) times daily as needed for cough. 20 capsule 0  . dexamethasone (DECADRON) 4 MG tablet Take 27m BID starting day before treatment for a total of 3 days. 20 tablet 0  . DIPHENHYD-LIDOCAINE-NYSTATIN MT Use as directed 1 Dose in the mouth or throat once a week.     . docusate sodium (COLACE) 100 MG capsule Take 100 mg by mouth 2 (two) times daily as needed for mild constipation.    . folic acid (FOLVITE) 8409MCG tablet Take 400 mcg by mouth daily.    .Marland Kitchenlidocaine (XYLOCAINE) 2 % solution use 10 milliliters by mouth every 4 hours if needed  0  . losartan (COZAAR) 100 MG tablet Take 1 tablet (100 mg total) by mouth daily. 30 tablet 0  . magic mouthwash w/lidocaine SOLN Take  5 mLs by mouth 4 (four) times daily as needed for mouth pain. 480 mL 1  . magnesium oxide (MAG-OX) 400 MG tablet Take 1 tablet (400 mg total) by mouth daily. 30 tablet 0  . megestrol (MEGACE) 40 MG/ML suspension Take 5 mLs (200 mg total) by mouth 2 (two) times daily. 300 mL 1  . morphine (MS CONTIN) 15 MG 12 hr tablet Take 1 tablet (15 mg total) by mouth 2 (two) times daily. 60 tablet 0  . omeprazole (PRILOSEC) 20 MG capsule Take 1 capsule (20 mg total) by mouth daily. 30 capsule 3  . ondansetron (ZOFRAN) 8 MG tablet Take 1 tablet (8 mg total) by mouth 2 (two) times daily as needed for nausea or vomiting. 30  tablet 1  . Oxycodone HCl 10 MG TABS Take 1 tablet (10 mg total) by mouth every 4 (four) hours as needed (pain). 60 tablet 0   No current facility-administered medications for this visit.    Review of Systems:  GENERAL: Feels better today, bad yesterday. Fever resolved.  No sweats or weight loss. Weight up 2 pounds. PERFORMANCE STATUS (ECOG): 1 HEENT: Chronic hoarseness, slightly improved.  Left upper tooth abscess.  Visual changes.  Runny nose.  No sore throat, mouth sores or tenderness. Lungs:  Shortness of breath with exertion.  No hemoptysis. Cardiac: No chest pain, palpitations, orthopnea, or PND. GI: Trouble swallowing.  No nausea, vomiting, diarrhea, constipation, melena or hematochezia. GU: No urgency, frequency, dysuria, or hematuria. Musculoskeletal: Back pain under right shoulder. No joint pain. No muscle tenderness. Extremities: No pain or swelling. Skin: No rashes or skin changes. Neuro: Headache, intermittent.  No numbness or weakness, balance or coordination issues. Endocrine: No diabetes, thyroid issues, hot flashes or night sweats. Psych: No mood changes, depression or anxiety. Pain: Pain in shoulder (stable). Review of systems: All other systems reviewed and found to be negative.  Physical Exam: Blood pressure 145/83, pulse 137, temperature 97 F (36.1 C), temperature source Tympanic, height 5' 2"  (1.575 m), weight 120 lb 11.2 oz (54.749 kg), SpO2 100 %.  GENERAL: Thin woman sitting comfortably in the exam room in no acute distress. MENTAL STATUS: Alert and oriented to person, place and time. HEAD: Short brown wig with highlights. Normocephalic, atraumatic, face symmetric, no Cushingoid features.  Left cheek edematous and tender. EYES: Brown eyes. Ruddy sclera.  Pupils equal round and reactive to light and accomodation. No conjunctivitis or scleral icterus. ENT: Hoarse.  Oropharynx clear without lesion. Tongue normal. Mucous membranes moist.   RESPIRATORY: Scattered rhonchi on bases. CARDIOVASCULAR: Tachycardia. No murmur, rub or gallop. ABDOMEN: Soft, non-tender, with active bowel sounds, and no hepatosplenomegaly. No masses. SKIN: No rashes, ulcers or lesions. EXTREMITIES: No edema, no skin discoloration or tenderness. No palpable cords. LYMPH NODES: No palpable cervical, supraclavicular, axillary or inguinal adenopathy  NEUROLOGICAL: Unremarkable. PSYCH: Appropriate.  Appointment on 06/06/2016  Component Date Value Ref Range Status  . Sodium 06/06/2016 135  135 - 145 mmol/L Final  . Potassium 06/06/2016 4.1  3.5 - 5.1 mmol/L Final  . Chloride 06/06/2016 101  101 - 111 mmol/L Final  . CO2 06/06/2016 25  22 - 32 mmol/L Final  . Glucose, Bld 06/06/2016 178* 65 - 99 mg/dL Final  . BUN 06/06/2016 8  6 - 20 mg/dL Final  . Creatinine, Ser 06/06/2016 0.57  0.44 - 1.00 mg/dL Final  . Calcium 06/06/2016 9.6  8.9 - 10.3 mg/dL Final  . Total Protein 06/06/2016 7.6  6.5 - 8.1 g/dL Final  .  Albumin 06/06/2016 4.0  3.5 - 5.0 g/dL Final  . AST 06/06/2016 25  15 - 41 U/L Final  . ALT 06/06/2016 18  14 - 54 U/L Final  . Alkaline Phosphatase 06/06/2016 73  38 - 126 U/L Final  . Total Bilirubin 06/06/2016 0.6  0.3 - 1.2 mg/dL Final  . GFR calc non Af Amer 06/06/2016 >60  >60 mL/min Final  . GFR calc Af Amer 06/06/2016 >60  >60 mL/min Final   Comment: (NOTE) The eGFR has been calculated using the CKD EPI equation. This calculation has not been validated in all clinical situations. eGFR's persistently <60 mL/min signify possible Chronic Kidney Disease.   . Anion gap 06/06/2016 9  5 - 15 Final  . WBC 06/06/2016 8.9  3.6 - 11.0 K/uL Final  . RBC 06/06/2016 4.64  3.80 - 5.20 MIL/uL Final  . Hemoglobin 06/06/2016 14.5  12.0 - 16.0 g/dL Final  . HCT 06/06/2016 42.4  35.0 - 47.0 % Final  . MCV 06/06/2016 91.5  80.0 - 100.0 fL Final  . MCH 06/06/2016 31.3  26.0 - 34.0 pg Final  . MCHC 06/06/2016 34.2  32.0 - 36.0 g/dL Final  .  RDW 06/06/2016 13.8  11.5 - 14.5 % Final  . Platelets 06/06/2016 179  150 - 440 K/uL Final  . Neutrophils Relative % 06/06/2016 69   Final  . Neutro Abs 06/06/2016 6.1  1.4 - 6.5 K/uL Final  . Lymphocytes Relative 06/06/2016 13   Final  . Lymphs Abs 06/06/2016 1.2  1.0 - 3.6 K/uL Final  . Monocytes Relative 06/06/2016 17   Final  . Monocytes Absolute 06/06/2016 1.5* 0.2 - 0.9 K/uL Final  . Eosinophils Relative 06/06/2016 0   Final  . Eosinophils Absolute 06/06/2016 0.0  0 - 0.7 K/uL Final  . Basophils Relative 06/06/2016 1   Final  . Basophils Absolute 06/06/2016 0.0  0 - 0.1 K/uL Final  . Magnesium 06/06/2016 1.4* 1.7 - 2.4 mg/dL Final    Assessment:  Audrey Mcgee is a 55 y.o. female with metastatic poorly differentiated adenocarcinoma of lung. She presented with a 5 month history of enlarging bilateral neck masses, progressive hoarseness, dysphagia, dyspnea on exertion, cough, left sided hearing loss, neck pain, and a 30 pound weight loss.  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 Biopsy of the right supraclavicular lymph node on 10/03/2014 revealed poorly differentiated non-small cell carcinoma, favoring adenocarcinoma consistent with lung primary (TTF-1 positive). KRAS was positive.   PET scan on 10/24/2014 revealed a left hilar mass with associated left upper lobe obstruction/atelectasis with extensive bilateral mediastinal and cervical adnopathy. Chest CT from 10/10/2014 also noted a 2.2 x 0.9 cm soft tissue density with internal calcifications in the right breast. Head MRI revealed no evidence of metastatic disease.  She received palliative radiation (3000 cGy) to the left lung from 10/24/2014 - 11/07/2014. She has received 3 cycles of carboplatin and Alimta (11/14/2014, 12/05/2014, and 02/13/2015). Last B12 was given on 04/09/2015.   She has pain in her left chest, throat, and shoulder. She is on oxycodone 10 mg every 6 hours  as needed. Morphine causes nausea. She has chronic shortness of breath with exertion.  She received 6 cycles carboplatin and Alimta (11/14/2014 - 04/30/2015). There was a delay between cycle #2 and cycle #3 secondary to a switch in caregivers.  She was diagnosed with a left peri-hilar infiltrate on 02/23/2015. She has completed a course of Levaquin. Chest CT on 03/21/2015  revealed LUL scarring, patchy ill defined airspace opacities in the lingula, upper esophageal thickening, and small mediastinal and hilar nodes. She completed a course of Azithromycin.  She received 2 cycles of maintenance Alimta (05/22/2015 - 06/15/2015). Chest CT on 07/09/2015 revealed patchy ground glass and nodular consolidation in the left upper lobe and to a lesser extent left lower lobe, minimally progressive from 03/21/2015.  Bone scan on 07/09/2015 revealed no evidence of metastatic disease.  CXR on 09/24/2015 revealed patchy left perihilar airspace opacities.  She was treated with azithromycin.  Chest CT on 10/17/2015 revealed patchy ground-glass and ill-defined nodular areas of opacity in the left apex, lingula, and anterior left lower lobe generally appeared decreased in the interval although 1 of the nodular areas in the left apex has progressed slightly since the previous study.  Overall imaging features are felt to be most likely treatment related.  There had been an interval slight decrease in proximal to mid esophageal circumferential wall thickening, potentially treatment related.   She was lost to follow-up for approximately 3 months.  Chest and abdomen CT scan on 02/04/2016 revealed evolving radiation changes in the left upper and left lower lobes. Previously measured left upper lobe nodule was no longer visualized. There was no evidence of metastatic disease.  Right hilar lymph nodes were not considered enlarged by CT size criteria and are nonspecific, but appeared new from the prior exam.  Bone scan on  02/19/2016 revealed no definite evidence of metastatic disease. There was subtle uptake within the posterior aspects of the upper ribs bilaterally (nonspecific and not entirely new).  There was a tiny focus of increased uptake near the vertex of the calvarium (new).  She has been hoarse since 02/01/2016.  She has received 3 cycles of maintenance Alimta (restarted 02/29/2016 - 04/17/2016).  She receives B12 every 9 weeks (last 03/28/2016) and is on daily folic acid.  She is tolerated her chemotherapy well.   Patient continues with ongoing hoarseness and difficulty swallowing, however, she was not able to attend her ENT visit due to multiple abscessed teeth. She was recently seen by dentist and has had extraction and is currently completing antibiotics. This is also the reason patient has missed the last couple of weeks of rescheduling for her chemotherapy. Dentist as planned to do further extractions in the future.  Plan: 1.  Labs today:  CBC with diff, CMP, Mg. 2.  Proceed with cycle #4 Alimta maintenance therapy with daily folic aid supplementation. 3.  Increase magnesium oxide 400 mg to 2 twice a day. Patient refusing IV supplementation today with a magnesium level of 1.4 due to IV mag side effects. 4.  Refill: Oxycodone and ondansetron. 5.  Follow-up with ENT has been rescheduled for Friday, June 30 at 1 PM 6.  Reschedule head CT next Monday June 19 at 1:45 PM as insurance approval is only valid through 06/11/16, re: hoarseness, swallowing difficulty, visual changes, and intermittent headaches. 7.  Patient to follow-up with dentistry for additional tooth extractions in future. Has recovered well from last weeks extraction, nearly completed all antibiotics. 8.  RTC 06/27/2016 for MD assessment, labs (CBC with diff, CMP, Mg), and cycle #5 Alimta.  Dr. Mike Gip was available for consultation and review of plan of care for this patient.  Evlyn Kanner, NP  06/06/2016, 9:48 AM

## 2016-06-06 NOTE — Progress Notes (Signed)
19- Pt became upset requesting chemotherapy to start and not wait on EKG.  Audrey Nim NP aware.  Okay to go ahead and start chemo order given. 1140- Pt left without getting EKG.  Explained to patient the importance of an EKG especially with such an increase in heart rate.  Patient still left.

## 2016-06-06 NOTE — Progress Notes (Signed)
Patient here for follow up. Needs refill for hydrocodone and nausea medication.

## 2016-06-09 ENCOUNTER — Ambulatory Visit: Payer: Medicaid Other

## 2016-06-10 ENCOUNTER — Other Ambulatory Visit: Payer: Self-pay | Admitting: *Deleted

## 2016-06-10 ENCOUNTER — Telehealth: Payer: Self-pay

## 2016-06-10 ENCOUNTER — Other Ambulatory Visit: Payer: Self-pay | Admitting: Hematology and Oncology

## 2016-06-10 NOTE — Telephone Encounter (Signed)
Already submitted

## 2016-06-10 NOTE — Telephone Encounter (Signed)
Called pt to let her know her request for megace had been sent in.  No other concerns noted.  Pt verbalized an understanding.

## 2016-06-17 ENCOUNTER — Ambulatory Visit: Payer: Medicaid Other | Attending: Hematology and Oncology

## 2016-06-19 ENCOUNTER — Telehealth: Payer: Self-pay | Admitting: *Deleted

## 2016-06-19 NOTE — Telephone Encounter (Signed)
Patient requesting refill for omeprazole.

## 2016-06-20 ENCOUNTER — Other Ambulatory Visit: Payer: Self-pay

## 2016-06-20 ENCOUNTER — Other Ambulatory Visit: Payer: Self-pay | Admitting: *Deleted

## 2016-06-20 MED ORDER — OMEPRAZOLE 20 MG PO CPDR: 20.0000 mg | DELAYED_RELEASE_CAPSULE | Freq: Every day | ORAL | Status: DC

## 2016-06-20 NOTE — Telephone Encounter (Signed)
Spoke with Theadora Rama, RN--whom works with Dr. Mike Gip.  This was already called in/sent into pharmacy.

## 2016-06-21 ENCOUNTER — Observation Stay
Admission: EM | Admit: 2016-06-21 | Discharge: 2016-06-22 | Disposition: A | Discharge status: Home / Self Care | Payer: Medicaid Other | Attending: Internal Medicine | Admitting: Internal Medicine

## 2016-06-21 ENCOUNTER — Emergency Department: Payer: Medicaid Other

## 2016-06-21 ENCOUNTER — Encounter: Payer: Self-pay | Admitting: Emergency Medicine

## 2016-06-21 DIAGNOSIS — R0603 Acute respiratory distress: Secondary | ICD-10-CM | POA: Insufficient documentation

## 2016-06-21 DIAGNOSIS — IMO0002 Reserved for concepts with insufficient information to code with codable children: Secondary | ICD-10-CM

## 2016-06-21 DIAGNOSIS — Z833 Family history of diabetes mellitus: Secondary | ICD-10-CM | POA: Insufficient documentation

## 2016-06-21 DIAGNOSIS — C3490 Malignant neoplasm of unspecified part of unspecified bronchus or lung: Secondary | ICD-10-CM | POA: Diagnosis not present

## 2016-06-21 DIAGNOSIS — C770 Secondary and unspecified malignant neoplasm of lymph nodes of head, face and neck: Secondary | ICD-10-CM | POA: Diagnosis not present

## 2016-06-21 DIAGNOSIS — R112 Nausea with vomiting, unspecified: Secondary | ICD-10-CM

## 2016-06-21 DIAGNOSIS — J432 Centrilobular emphysema: Secondary | ICD-10-CM | POA: Diagnosis not present

## 2016-06-21 DIAGNOSIS — J690 Pneumonitis due to inhalation of food and vomit: Principal | ICD-10-CM | POA: Insufficient documentation

## 2016-06-21 DIAGNOSIS — I10 Essential (primary) hypertension: Secondary | ICD-10-CM | POA: Insufficient documentation

## 2016-06-21 DIAGNOSIS — R000 Tachycardia, unspecified: Secondary | ICD-10-CM | POA: Diagnosis present

## 2016-06-21 DIAGNOSIS — Z809 Family history of malignant neoplasm, unspecified: Secondary | ICD-10-CM | POA: Insufficient documentation

## 2016-06-21 DIAGNOSIS — R1310 Dysphagia, unspecified: Secondary | ICD-10-CM

## 2016-06-21 DIAGNOSIS — K047 Periapical abscess without sinus: Secondary | ICD-10-CM | POA: Insufficient documentation

## 2016-06-21 DIAGNOSIS — R0602 Shortness of breath: Secondary | ICD-10-CM | POA: Diagnosis not present

## 2016-06-21 DIAGNOSIS — J441 Chronic obstructive pulmonary disease with (acute) exacerbation: Secondary | ICD-10-CM

## 2016-06-21 DIAGNOSIS — Z8249 Family history of ischemic heart disease and other diseases of the circulatory system: Secondary | ICD-10-CM | POA: Diagnosis not present

## 2016-06-21 DIAGNOSIS — G893 Neoplasm related pain (acute) (chronic): Secondary | ICD-10-CM | POA: Insufficient documentation

## 2016-06-21 DIAGNOSIS — I700 Atherosclerosis of aorta: Secondary | ICD-10-CM | POA: Insufficient documentation

## 2016-06-21 DIAGNOSIS — Z87891 Personal history of nicotine dependence: Secondary | ICD-10-CM | POA: Diagnosis not present

## 2016-06-21 LAB — CBC WITH DIFFERENTIAL/PLATELET
BASOS PCT: 1 %
Band Neutrophils: 0 %
Basophils Absolute: 0.1 10*3/uL (ref 0–0.1)
Blasts: 0 %
EOS PCT: 0 %
Eosinophils Absolute: 0 10*3/uL (ref 0–0.7)
HCT: 35.8 % (ref 35.0–47.0)
Hemoglobin: 12.2 g/dL (ref 12.0–16.0)
LYMPHS ABS: 1.8 10*3/uL (ref 1.0–3.6)
Lymphocytes Relative: 34 %
MCH: 31 pg (ref 26.0–34.0)
MCHC: 34.1 g/dL (ref 32.0–36.0)
MCV: 91.1 fL (ref 80.0–100.0)
METAMYELOCYTES PCT: 0 %
MONO ABS: 0.5 10*3/uL (ref 0.2–0.9)
MONOS PCT: 10 %
MYELOCYTES: 0 %
NEUTROS ABS: 3 10*3/uL (ref 1.4–6.5)
NEUTROS PCT: 55 %
NRBC: 0 /100{WBCs}
Other: 0 %
PLATELETS: 263 10*3/uL (ref 150–440)
Promyelocytes Absolute: 0 %
RBC: 3.93 MIL/uL (ref 3.80–5.20)
RDW: 13.5 % (ref 11.5–14.5)
SMEAR REVIEW: ADEQUATE
WBC: 5.4 10*3/uL (ref 3.6–11.0)

## 2016-06-21 LAB — BLOOD GAS, ARTERIAL
ALLENS TEST (PASS/FAIL): POSITIVE — AB
Acid-Base Excess: 0.7 mmol/L (ref 0.0–3.0)
BICARBONATE: 24.5 meq/L (ref 21.0–28.0)
FIO2: 0.32
O2 Saturation: 99.2 %
PATIENT TEMPERATURE: 37
PH ART: 7.44 (ref 7.350–7.450)
pCO2 arterial: 36 mmHg (ref 32.0–48.0)
pO2, Arterial: 136 mmHg — ABNORMAL HIGH (ref 83.0–108.0)

## 2016-06-21 LAB — CBC
HCT: 34.7 % — ABNORMAL LOW (ref 35.0–47.0)
HEMOGLOBIN: 11.8 g/dL — AB (ref 12.0–16.0)
MCH: 31.6 pg (ref 26.0–34.0)
MCHC: 33.9 g/dL (ref 32.0–36.0)
MCV: 93.2 fL (ref 80.0–100.0)
PLATELETS: 246 10*3/uL (ref 150–440)
RBC: 3.73 MIL/uL — AB (ref 3.80–5.20)
RDW: 13.7 % (ref 11.5–14.5)
WBC: 5.6 10*3/uL (ref 3.6–11.0)

## 2016-06-21 LAB — HEPATIC FUNCTION PANEL
ALBUMIN: 3.4 g/dL — AB (ref 3.5–5.0)
ALK PHOS: 50 U/L (ref 38–126)
ALT: 17 U/L (ref 14–54)
AST: 37 U/L (ref 15–41)
Total Protein: 6.6 g/dL (ref 6.5–8.1)

## 2016-06-21 LAB — BASIC METABOLIC PANEL
Anion gap: 12 (ref 5–15)
CALCIUM: 8.9 mg/dL (ref 8.9–10.3)
CO2: 19 mmol/L — ABNORMAL LOW (ref 22–32)
CREATININE: 0.56 mg/dL (ref 0.44–1.00)
Chloride: 106 mmol/L (ref 101–111)
GFR calc Af Amer: >60 mL/min (ref >60)
GLUCOSE: 139 mg/dL — AB (ref 65–99)
POTASSIUM: 3.6 mmol/L (ref 3.5–5.1)
SODIUM: 137 mmol/L (ref 135–145)

## 2016-06-21 LAB — CK: CK TOTAL: 34 U/L — AB (ref 38–234)

## 2016-06-21 LAB — TROPONIN I
Troponin I: <0.03 ng/mL (ref <0.03)
Troponin I: <0.03 ng/mL (ref <0.03)

## 2016-06-21 LAB — TSH: TSH: 0.023 u[IU]/mL — ABNORMAL LOW (ref 0.350–4.500)

## 2016-06-21 LAB — HEMOGLOBIN A1C: HEMOGLOBIN A1C: 5.2 % (ref 4.0–6.0)

## 2016-06-21 LAB — LACTIC ACID, PLASMA
LACTIC ACID, VENOUS: 1.7 mmol/L (ref 0.5–1.9)
Lactic Acid, Venous: 0.9 mmol/L (ref 0.5–1.9)

## 2016-06-21 LAB — CREATININE, SERUM: CREATININE: 0.48 mg/dL (ref 0.44–1.00)

## 2016-06-21 MED ORDER — MEGESTROL ACETATE 40 MG/ML PO SUSP
800.0000 mg | Freq: Every day | ORAL | Status: DC
  Administered 2016-06-21 – 2016-06-22 (×2): 800 mg via ORAL
  Filled 2016-06-21 (×2): qty 20

## 2016-06-21 MED ORDER — NICOTINE 21 MG/24HR TD PT24
21.0000 mg | MEDICATED_PATCH | Freq: Every day | TRANSDERMAL | Status: DC
  Administered 2016-06-21 – 2016-06-22 (×2): 21 mg via TRANSDERMAL
  Filled 2016-06-21 (×2): qty 1

## 2016-06-21 MED ORDER — IOPAMIDOL (ISOVUE-370) INJECTION 76%
75.0000 mL | Freq: Once | INTRAVENOUS | Status: AC | PRN
  Administered 2016-06-21: 75 mL via INTRAVENOUS

## 2016-06-21 MED ORDER — MAGNESIUM OXIDE 400 (241.3 MG) MG PO TABS
400.0000 mg | ORAL_TABLET | Freq: Every day | ORAL | Status: DC
  Filled 2016-06-21: qty 1

## 2016-06-21 MED ORDER — MORPHINE SULFATE ER 15 MG PO TBCR
15.0000 mg | EXTENDED_RELEASE_TABLET | Freq: Two times a day (BID) | ORAL | Status: DC
  Filled 2016-06-21: qty 1

## 2016-06-21 MED ORDER — FOLIC ACID 1 MG PO TABS
500.0000 ug | ORAL_TABLET | Freq: Every day | ORAL | Status: DC
  Administered 2016-06-21 – 2016-06-22 (×2): 0.5 mg via ORAL
  Filled 2016-06-21 (×2): qty 1
  Filled 2016-06-21: qty 0.5

## 2016-06-21 MED ORDER — MAGIC MOUTHWASH W/LIDOCAINE
5.0000 mL | Freq: Four times a day (QID) | ORAL | Status: DC | PRN
  Filled 2016-06-21: qty 5

## 2016-06-21 MED ORDER — IPRATROPIUM-ALBUTEROL 0.5-2.5 (3) MG/3ML IN SOLN
3.0000 mL | Freq: Once | RESPIRATORY_TRACT | Status: AC
  Administered 2016-06-21: 3 mL via RESPIRATORY_TRACT
  Filled 2016-06-21: qty 3

## 2016-06-21 MED ORDER — SODIUM CHLORIDE 0.9 % IV BOLUS (SEPSIS)
500.0000 mL | Freq: Once | INTRAVENOUS | Status: AC
  Administered 2016-06-21: 500 mL via INTRAVENOUS

## 2016-06-21 MED ORDER — ALBUTEROL SULFATE (2.5 MG/3ML) 0.083% IN NEBU: 2.5000 mg | INHALATION_SOLUTION | RESPIRATORY_TRACT | Status: DC | PRN

## 2016-06-21 MED ORDER — ASPIRIN EC 325 MG PO TBEC: 325.0000 mg | DELAYED_RELEASE_TABLET | Freq: Every day | ORAL | Status: DC

## 2016-06-21 MED ORDER — ACETAMINOPHEN 650 MG RE SUPP: 650.0000 mg | Freq: Four times a day (QID) | RECTAL | Status: DC | PRN

## 2016-06-21 MED ORDER — LEVOFLOXACIN IN D5W 750 MG/150ML IV SOLN
750.0000 mg | INTRAVENOUS | Status: DC
  Administered 2016-06-21: 750 mg via INTRAVENOUS
  Filled 2016-06-21 (×2): qty 150

## 2016-06-21 MED ORDER — GUAIFENESIN ER 600 MG PO TB12
600.0000 mg | ORAL_TABLET | Freq: Two times a day (BID) | ORAL | Status: DC
  Administered 2016-06-21: 600 mg via ORAL
  Filled 2016-06-21: qty 1

## 2016-06-21 MED ORDER — LOSARTAN POTASSIUM 50 MG PO TABS
100.0000 mg | ORAL_TABLET | Freq: Every day | ORAL | Status: DC
  Administered 2016-06-21 – 2016-06-22 (×2): 100 mg via ORAL
  Filled 2016-06-21 (×2): qty 2

## 2016-06-21 MED ORDER — BENZONATATE 100 MG PO CAPS: 100.0000 mg | ORAL_CAPSULE | Freq: Three times a day (TID) | ORAL | Status: DC | PRN

## 2016-06-21 MED ORDER — LIDOCAINE VISCOUS 2 % MT SOLN
15.0000 mL | OROMUCOSAL | Status: DC | PRN
  Filled 2016-06-21: qty 15

## 2016-06-21 MED ORDER — LORAZEPAM 2 MG/ML IJ SOLN
0.5000 mg | Freq: Once | INTRAMUSCULAR | Status: AC
  Administered 2016-06-21: 0.5 mg via INTRAVENOUS
  Filled 2016-06-21: qty 1

## 2016-06-21 MED ORDER — OXYCODONE HCL 5 MG PO TABS: 10.0000 mg | ORAL_TABLET | ORAL | Status: DC | PRN

## 2016-06-21 MED ORDER — PREDNISONE 20 MG PO TABS
60.0000 mg | ORAL_TABLET | Freq: Once | ORAL | Status: AC
  Administered 2016-06-21: 60 mg via ORAL
  Filled 2016-06-21: qty 3

## 2016-06-21 MED ORDER — ENOXAPARIN SODIUM 40 MG/0.4ML ~~LOC~~ SOLN
40.0000 mg | SUBCUTANEOUS | Status: DC
  Administered 2016-06-21: 40 mg via SUBCUTANEOUS
  Filled 2016-06-21: qty 0.4

## 2016-06-21 MED ORDER — METHYLPREDNISOLONE SODIUM SUCC 125 MG IJ SOLR
60.0000 mg | INTRAMUSCULAR | Status: DC
  Administered 2016-06-22: 60 mg via INTRAVENOUS
  Filled 2016-06-21: qty 2

## 2016-06-21 MED ORDER — SODIUM CHLORIDE 0.9% FLUSH
3.0000 mL | Freq: Two times a day (BID) | INTRAVENOUS | Status: DC
  Administered 2016-06-21 – 2016-06-22 (×3): 3 mL via INTRAVENOUS

## 2016-06-21 MED ORDER — ACETAMINOPHEN 325 MG PO TABS: 650.0000 mg | ORAL_TABLET | Freq: Four times a day (QID) | ORAL | Status: DC | PRN

## 2016-06-21 MED ORDER — TIOTROPIUM BROMIDE MONOHYDRATE 18 MCG IN CAPS
18.0000 ug | ORAL_CAPSULE | Freq: Every day | RESPIRATORY_TRACT | Status: DC
  Administered 2016-06-21 – 2016-06-22 (×2): 18 ug via RESPIRATORY_TRACT
  Filled 2016-06-21: qty 5

## 2016-06-21 MED ORDER — METHYLPREDNISOLONE SODIUM SUCC 125 MG IJ SOLR: 125.0000 mg | INTRAMUSCULAR | Status: DC

## 2016-06-21 MED ORDER — METOPROLOL TARTRATE 25 MG PO TABS
25.0000 mg | ORAL_TABLET | Freq: Two times a day (BID) | ORAL | Status: DC
  Administered 2016-06-21 – 2016-06-22 (×3): 25 mg via ORAL
  Filled 2016-06-21 (×3): qty 1

## 2016-06-21 MED ORDER — ONDANSETRON HCL 4 MG PO TABS: 8.0000 mg | ORAL_TABLET | Freq: Two times a day (BID) | ORAL | Status: DC | PRN

## 2016-06-21 MED ORDER — PANTOPRAZOLE SODIUM 40 MG PO TBEC
40.0000 mg | DELAYED_RELEASE_TABLET | Freq: Every day | ORAL | Status: DC
  Administered 2016-06-21 – 2016-06-22 (×2): 40 mg via ORAL
  Filled 2016-06-21 (×2): qty 1

## 2016-06-21 MED ORDER — ALBUTEROL SULFATE (2.5 MG/3ML) 0.083% IN NEBU
2.5000 mg | INHALATION_SOLUTION | RESPIRATORY_TRACT | Status: DC
  Administered 2016-06-21 (×2): 2.5 mg via RESPIRATORY_TRACT
  Filled 2016-06-21 (×2): qty 3

## 2016-06-21 MED ORDER — DOCUSATE SODIUM 100 MG PO CAPS: 100.0000 mg | ORAL_CAPSULE | Freq: Two times a day (BID) | ORAL | Status: DC | PRN

## 2016-06-21 MED ORDER — PREDNISONE 20 MG PO TABS: 50.0000 mg | ORAL_TABLET | Freq: Every day | ORAL | Status: DC

## 2016-06-21 NOTE — ED Provider Notes (Signed)
[facility] Emergency Department Provider Note  ____________________________________________  Time seen: Approximately 7:44 AM  I have reviewed the triage vital signs and the nursing notes.   HISTORY  Chief Complaint Shortness of Breath    HPI Audrey Mcgee is a 55 y.o. female presents for evaluation of dyspnea. Patient reports sudden onset of rather severe shortness of breath this morning when she woke up. She has had a slight productive, yellowish phlegm cough for the last 2-3 days. No fevers or chills. No chest pain. No leg swelling, nausea or vomiting. She reports currently on chemotherapy, not having experienced any complications for stage IV lung cancer.  Denies wheezing, denies a history of "COPD" or emphysema.  No long trips or travel.  Past Medical History  Diagnosis Date  . Hypertension   . Lung cancer [facility])     Patient Active Problem List   Diagnosis Date Noted  . Acute respiratory distress (HCC) 06/21/2016  . COPD exacerbation ([place]) 06/21/2016  . Sinus tachycardia ([place]) 06/21/2016  . Recurrent aspiration bronchitis/pneumonia ([place]) 06/21/2016  . Dysphagia 06/21/2016  . Nausea and vomiting 06/21/2016  . Dental abscess 05/10/2016  . Swallowing difficulty 03/28/2016  . Hypomagnesemia 02/29/2016  . Hoarseness 02/18/2016  . Cancer related pain 07/08/2015  . Adenocarcinoma, lung ([place]) 03/26/2015  . Cancer of lung ([place]) 10/13/2014  . 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 Ear ache 10/03/2014  . Abscess of buccal cavity 10/03/2014  . Lump in neck 10/03/2014  . Laryngeal pain 10/03/2014    Past Surgical History  Procedure Laterality Date  . Stomach surgery      Pt reports for a tumor    Current Outpatient Rx  Name  Route  Sig  Dispense  Refill  . benzonatate (TESSALON) 100 MG capsule   Oral   Take 1 capsule (100 mg total) by mouth 3 (three) times daily as needed for cough.   20 capsule   0   .  dexamethasone (DECADRON) 4 MG tablet      Take '4mg'$  BID starting day before treatment for a total of 3 days.   20 tablet   0     Starting day before treatment x3 days   . DIPHENHYD-LIDOCAINE-NYSTATIN MT   Mouth/Throat   Use as directed 1 Dose in the mouth or throat once a week.          . docusate sodium (COLACE) 100 MG capsule   Oral   Take 100 mg by mouth 2 (two) times daily as needed for mild constipation.         . folic acid (FOLVITE) 329 MCG tablet   Oral   Take 800 mcg by mouth daily.          [name] lidocaine (XYLOCAINE) 2 % solution      use 10 milliliters by mouth every 4 hours if needed      0   . magic mouthwash w/lidocaine SOLN   Oral   Take 5 mLs by mouth 4 (four) times daily as needed for mouth pain.   480 mL   1   . magnesium oxide (MAG-OX) 400 MG tablet   Oral   Take 1 tablet (400 mg total) by mouth daily.   30 tablet   0   . megestrol (MEGACE) 40 MG/ML suspension      take 5 milliliters by mouth twice a day   300 mL   1   . morphine (MS CONTIN) 15 MG  12 hr tablet   Oral   Take 1 tablet (15 mg total) by mouth 2 (two) times daily.   60 tablet   0   . omeprazole (PRILOSEC) 20 MG capsule   Oral   Take 1 capsule (20 mg total) by mouth daily.   30 capsule   3   . ondansetron (ZOFRAN) 8 MG tablet   Oral   Take 1 tablet (8 mg total) by mouth 2 (two) times daily as needed for nausea or vomiting.   30 tablet   1   . Oxycodone HCl 10 MG TABS   Oral   Take 1 tablet (10 mg total) by mouth every 4 (four) hours as needed (pain).   60 tablet   0   . levofloxacin (LEVAQUIN) 500 MG tablet   Oral   Take 1 tablet (500 mg total) by mouth daily.   5 tablet   0     For 5 days only.   [name] losartan (COZAAR) 100 MG tablet   Oral   Take 1 tablet (100 mg total) by mouth daily.   30 tablet   0   . metoprolol tartrate (LOPRESSOR) 25 MG tablet   Oral   Take 1 tablet (25 mg total) by mouth 2 (two) times daily.   30 tablet   0   . predniSONE  (STERAPRED UNI-PAK 21 TAB) 10 MG (21) TBPK tablet   Oral   Take 1 tablet (10 mg total) by mouth daily. 6 tab lets on day1 5 tab on day 2 4 tab daily for day 3 3 tab daily for 4 2 tab daily for 5 1 tab daily for 6   21 tablet   0   . tiotropium (SPIRIVA) 18 MCG inhalation capsule   Inhalation   Place 1 capsule (18 mcg total) into inhaler and inhale daily.   30 capsule   1     Allergies No known allergies  Family History  Problem Relation Age of Onset  . Hypertension Father   . Cancer Father   . Diabetes Father   . Cancer Maternal Aunt     Social History Social History  Substance Use Topics  . Smoking status: Former Smoker -- 15 years    Types: Cigarettes  . Smokeless tobacco: Never Used  . Alcohol Use: 1.2 oz/week    2 Cans of beer per week    Review of Systems Constitutional: No fever/chills Eyes: No visual changes. ENT: No sore throat. Cardiovascular: Denies chest pain. Respiratory: See history of present illness Gastrointestinal: No abdominal pain.  No nausea, no vomiting.  No diarrhea.  No constipation. Genitourinary: Negative for dysuria. Musculoskeletal: Negative for back pain. Skin: Negative for rash. Neurological: Negative for headaches, focal weakness or numbness.  10-point ROS otherwise negative.  ____________________________________________   PHYSICAL EXAM:  VITAL SIGNS: ED Triage Vitals  Enc Vitals Group     BP 06/21/16 0728 161/101 mmHg     Pulse Rate 06/21/16 0728 118     Resp --      Temp 06/21/16 0728 97.6 F (36.4 C)     Temp Source 06/21/16 0728 Oral     SpO2 06/21/16 0724 98 %     Weight 06/21/16 0728 127 lb 9.6 oz (57.879 kg)     Height 06/21/16 0728 '5\' 2"'$  (1.575 m)     Head Cir --      Peak Flow --      Pain Score --  Pain Loc --      Pain Edu? --      Excl. in [place]? --    Constitutional: Alert and oriented. Very pleasant, amicable. The patient appears dyspneic with noted tachypnea. Eyes: Conjunctivae are normal.  PERRL. EOMI. Head: Atraumatic. Nose: No congestion/rhinnorhea. Mouth/Throat: Mucous membranes are moist.  Oropharynx non-erythematous. Neck: No stridor.  Patient's voice is slightly hoarse, she reports that this is been stable for the last 2 years and was due to her cancer. Cardiovascular: Tachycardic rate, regular rhythm. Grossly normal heart sounds.  Good peripheral circulation. Respiratory: Moderate accessory muscle use. Speaks in short phrases. Tachypnea, respiratory rate approximately 30. She demonstrates equal and slightly diminished lung sounds throughout without evidence of acute wheezing or prolonged expiratory phase. No rales or rhonchi are noted. Lungs CTAB. Gastrointestinal: Soft and nontender. No distention. usculoskeletal: No lower extremity tenderness nor edema.  No joint effusions. Neurologic:  Normal speech and language. No gross focal neurologic deficits are appreciated. Skin:  Skin is warm, dry and intact. No rash noted. Psychiatric: Mood and affect are normal. Speech and behavior are normal.  ____________________________________________   LABS (all labs ordered are listed, but only abnormal results are displayed)  Labs Reviewed  BASIC METABOLIC PANEL - Abnormal; Notable for the following:    CO2 19 (*)    Glucose, Bld 139 (*)    BUN <5 (*)    All other components within normal limits  HEPATIC FUNCTION PANEL - Abnormal; Notable for the following:    Albumin 3.4 (*)    Total Bilirubin <0.1 (*)    Bilirubin, Direct <0.1 (*)    All other components within normal limits  CK - Abnormal; Notable for the following:    Total CK 34 (*)    All other components within normal limits  BLOOD GAS, ARTERIAL - Abnormal; Notable for the following:    pO2, Arterial 136 (*)    Allens test (pass/fail) POSITIVE (*)    All other components within normal limits  CBC - Abnormal; Notable for the following:    RBC 3.73 (*)    Hemoglobin 11.8 (*)    HCT 34.7 (*)    All other components  within normal limits  TSH - Abnormal; Notable for the following:    TSH 0.023 (*)    All other components within normal limits  BASIC METABOLIC PANEL - Abnormal; Notable for the following:    Glucose, Bld 177 (*)    All other components within normal limits  CBC - Abnormal; Notable for the following:    RBC 3.54 (*)    Hemoglobin 11.1 (*)    HCT 32.5 (*)    All other components within normal limits  CULTURE, EXPECTORATED SPUTUM-ASSESSMENT  CBC WITH DIFFERENTIAL/PLATELET  LACTIC ACID, PLASMA  LACTIC ACID, PLASMA  TROPONIN I  HEMOGLOBIN A1C  CREATININE, SERUM  TROPONIN I  TROPONIN I  TROPONIN I   ____________________________________________  EKG  Reviewed injury by eating at 7:30 AM Heart rate 135 PR 120 QTc 4:30 Reviewed and interpreted as sinus tachycardia, there is potentially hyperacute T waves noted in the 2, however no evidence of acute ST elevation MI is noted. No obvious ischemic change noted. Compared with the patient's previous EKG, rate is now significantly higher. Probable left ventricular hypertrophy accounting for hyperacute appearance of T-wave. ____________________________________________  RADIOLOGY   CT ANGIO CHEST PE W OR WO CONTRAST (Final result) Result time: 06/21/16 09:23:37   Final result by Rad Results In Interface (06/21/16 09:23:37)  Narrative:   CLINICAL DATA: Tachycardia. Shortness of breath. History of lung cancer on chemotherapy.  EXAM: CT ANGIOGRAPHY CHEST WITH CONTRAST  TECHNIQUE: Multidetector CT imaging of the chest was performed using the standard protocol during bolus administration of intravenous contrast. Multiplanar CT image reconstructions and MIPs were obtained to evaluate the vascular anatomy.  CONTRAST: 75 cc Isovue 370 IV.  COMPARISON: Chest radiograph from earlier today. 02/04/2016 CT chest.  FINDINGS: Mediastinum/Nodes: Motion degradation limits evaluation of the subsegmental pulmonary arteries. There are no  filling defects in the central, lobar, segmental or subsegmental pulmonary artery branches to suggest acute pulmonary embolism. Atherosclerotic nonaneurysmal thoracic aorta. Normal caliber pulmonary arteries. Normal heart size. No significant pericardial fluid/thickening. No discrete thyroid nodules. Unremarkable esophagus. No pathologically enlarged axillary, mediastinal or hilar lymph nodes.  Lungs/Pleura: No pneumothorax. No pleural effusion. No acute consolidative airspace disease or new significant pulmonary nodules. There is stable patchy peribronchovascular interstitial thickening and scattered reticulation throughout the left upper lobe and parahilar left lower lobe, with associated volume loss in the left hemithorax, presumably representing stable post treatment change. Mild centrilobular emphysema.  Upper abdomen: Unremarkable.  Musculoskeletal: No aggressive appearing focal osseous lesions. Mild thoracic spondylosis.  Review of the MIP images confirms the above findings.  IMPRESSION: 1. Motion degraded scan. No evidence of pulmonary embolism . 2. Stable patchy peribronchovascular interstitial thickening, volume loss and patchy reticulation in the left upper lobe and central left lower lobe, presumably representing stable posttreatment change . 3. No evidence of metastatic disease in the chest . 4. Mild centrilobular emphysema . 5. Aortic atherosclerosis .   Electronically Signed By: Ilona Sorrel M.D. On: 06/21/2016 09:23          DG Chest Portable 1 View (Final result) Result time: 06/21/16 07:58:35   Final result by Rad Results In Interface (06/21/16 07:58:35)   Narrative:   CLINICAL DATA: Shortness of breath, history of lung carcinoma  EXAM: PORTABLE CHEST 1 VIEW  COMPARISON: 02/04/2016  FINDINGS: Cardiac shadow is within normal limits. The lungs are well aerated bilaterally. Mild elevation of the left hemidiaphragm is seen. No focal  infiltrate or sizable effusion is noted. No bony abnormality is noted.  IMPRESSION: No acute abnormality noted.   Electronically Signed By: Inez Catalina M.D. On: 06/21/2016 07:58          ____________________________________________   PROCEDURES  Procedure(s) performed: None  Critical Care performed: No  ____________________________________________   INITIAL IMPRESSION / ASSESSMENT AND PLAN / ED COURSE  Pertinent labs & imaging results that were available during my care of the patient were reviewed by me and considered in my medical decision making (see chart for details).  Patient presents for evaluation of dyspnea. Given her history of cancer, active smoking status and no chest pain my first concern is rule out potential pulmonary embolism. Other strong consideration to acute acute coronary syndrome the EKG does not appear to demonstrate and she is lacking chest pain. Other clinical factors including toxic, metabolic, anemia, infection are all considered. ----------------------------------------- 10:04 AM on 06/21/2016 -----------------------------------------   After receiving nebulizers, place the patient on 2 L nasal cannula she reports symptoms are improving however she has persistent tachycardia after a liter of fluid and anxiolytic, for which she reports she does not feel anxious. Given that she is at high risk for DVT, and there is slight suboptimal timing of her bolus I will obtain ultrasound of her lower extremities to evaluate for DVT, and I have asked our internal medicine service to  evaluate the patient for admission due to persistent tachycardia with mild dyspnea. I am concerned that there is no acute medical etiology at play here, but I have not yet fully identified in the hospital. I discussed with the internal medicine physician potentially anticoagulating the patient as we await further evaluation, they will make determination after seeing the  patient. ____________________________________________   FINAL CLINICAL IMPRESSION(S) / ED DIAGNOSES  Final diagnoses:  Tachycardia      Delman Kitten, MD 06/22/16 [id number]

## 2016-06-21 NOTE — ED Notes (Signed)
Patient brought in by [facility] from home for shortness of breath. Patient states that she woke up out of her sleep this morning and could not breath. Patient has hx/o stage 4 lung cancer, currently receiving chemo, last treatment was 06/06/16. Upon EMS arrival patient was hypertensive at 210/100 and tachycardic at 168. 1.5 inches of nitro paste was applied to patients left upper chest, BP rechecked at 178/108 and heart rate came down to 130

## 2016-06-21 NOTE — ED Notes (Signed)
Informed RN bed ready  [phone]

## 2016-06-21 NOTE — H&P (Signed)
[facility] NAME: Audrey Mcgee    MR#:  [id number]  DATE OF BIRTH:  [birth year]  DATE OF ADMISSION:  06/21/2016  PRIMARY CARE PHYSICIAN: Donnie Coffin, MD   REQUESTING/REFERRING PHYSICIAN:   CHIEF COMPLAINT:   Chief Complaint  Patient presents with  . Shortness of Breath    HISTORY OF PRESENT ILLNESS: Audrey Mcgee  is a 55 y.o. female with a known history of Metastatic poorly differentiated non-small cell lung carcinoma, hoarseness, difficulty swallowing, hypomagnesemia, hearing loss, left hilar mass, ongoing smoking, who presents to the hospital with complaints of severe shortness of breath. According to patient, she was asleep last night and in the middle of the night she woke up choking, she grabbed a trash can and vomited, but then she was able to fall asleep again.  Today in the morning, however, she woke up very short of breath, could not take deep breath . She presented to the hospital in significant respiratory distress, but not hypoxic. She was also very tachycardic with heart rate of 140 and above. Her chest x-ray was unremarkable. CT angiogram was performed due to history of lung carcinoma, which showed no pneumonitis or pulmonary embolism. Hospitalist services were contacted for admission. Patient admits of cough with some yellow sputum , feeling feverish and chilly intermittently recently.   PAST MEDICAL HISTORY:   Past Medical History  Diagnosis Date  . Hypertension   . Lung cancer ([place])     PAST SURGICAL HISTORY:  Past Surgical History  Procedure Laterality Date  . Stomach surgery      Pt reports for a tumor    SOCIAL HISTORY:  Social History  Substance Use Topics  . Smoking status: Former Smoker -- 15 years    Types: Cigarettes  . Smokeless tobacco: Never Used  . Alcohol Use: 1.2 oz/week    2 Cans of beer per week    FAMILY HISTORY:  Family History  Problem Relation Age of Onset  .  Hypertension Father   . Cancer Father   . Diabetes Father   . Cancer Maternal Aunt     DRUG ALLERGIES:  Allergies  Allergen Reactions  . No Known Allergies     Review of Systems  Constitutional: Positive for fever and chills. Negative for weight loss and malaise/fatigue.  HENT: Positive for hearing loss. Negative for congestion.   Eyes: Negative for blurred vision and double vision.  Respiratory: Positive for cough, sputum production and shortness of breath. Negative for wheezing.   Cardiovascular: Negative for chest pain, palpitations, orthopnea, leg swelling and PND.  Gastrointestinal: Positive for nausea and vomiting. Negative for abdominal pain, diarrhea, constipation, blood in stool and melena.  Genitourinary: Negative for dysuria, urgency, frequency and hematuria.  Musculoskeletal: Negative for falls.  Skin: Negative for rash.  Neurological: Positive for weakness. Negative for dizziness.  Psychiatric/Behavioral: Negative for depression and memory loss. The patient is not nervous/anxious.     MEDICATIONS AT HOME:  Prior to Admission medications   Medication Sig Start Date End Date Taking? Authorizing Provider  benzonatate (TESSALON) 100 MG capsule Take 1 capsule (100 mg total) by mouth 3 (three) times daily as needed for cough. 09/24/15   Lequita Asal, MD  dexamethasone (DECADRON) 4 MG tablet Take '4mg'$  BID starting day before treatment for a total of 3 days. 02/14/16   Lequita Asal, MD  DIPHENHYD-LIDOCAINE-NYSTATIN MT Use as directed 1 Dose in the mouth or throat once a week.  Historical Provider, MD  docusate sodium (COLACE) 100 MG capsule Take 100 mg by mouth 2 (two) times daily as needed for mild constipation.    Historical Provider, MD  folic acid (FOLVITE) 678 MCG tablet Take 400 mcg by mouth daily.    Historical Provider, MD  lidocaine (XYLOCAINE) 2 % solution use 10 milliliters by mouth every 4 hours if needed 09/04/15   Historical Provider, MD  losartan  (COZAAR) 100 MG tablet Take 1 tablet (100 mg total) by mouth daily. 07/24/15   Lloyd Huger, MD  magic mouthwash w/lidocaine SOLN Take 5 mLs by mouth 4 (four) times daily as needed for mouth pain. 08/31/15   Lequita Asal, MD  magnesium oxide (MAG-OX) 400 MG tablet Take 1 tablet (400 mg total) by mouth daily. 04/17/16   Lequita Asal, MD  megestrol (MEGACE) 40 MG/ML suspension take 5 milliliters by mouth twice a day 06/10/16   Lequita Asal, MD  morphine (MS CONTIN) 15 MG 12 hr tablet Take 1 tablet (15 mg total) by mouth 2 (two) times daily. 03/28/16   Lequita Asal, MD  omeprazole (PRILOSEC) 20 MG capsule Take 1 capsule (20 mg total) by mouth daily. 06/20/16   Lequita Asal, MD  ondansetron (ZOFRAN) 8 MG tablet Take 1 tablet (8 mg total) by mouth 2 (two) times daily as needed for nausea or vomiting. 06/06/16   Evlyn Kanner, NP  Oxycodone HCl 10 MG TABS Take 1 tablet (10 mg total) by mouth every 4 (four) hours as needed (pain). 06/06/16   Evlyn Kanner, NP      PHYSICAL EXAMINATION:   VITAL SIGNS: Blood pressure 140/94, pulse 126, temperature 97.6 F (36.4 C), temperature source Oral, resp. rate 23, height '5\' 2"'$  (1.575 m), weight 57.879 kg (127 lb 9.6 oz), SpO2 100 %.  GENERAL:  55 y.o.-year-old patient lying in the bed In mild-to-moderate respiratory distress, seems to be choking intermittently while talking, hoarse speech.  EYES: Pupils equal, round, reactive to light and accommodation. No scleral icterus. Extraocular muscles intact.  HEENT: Head atraumatic, normocephalic. Oropharynx and nasopharynx clear.  NECK:  Supple, no jugular venous distention. No thyroid enlargement, no tenderness.  LUNGS: Normal breath sounds bilaterally, no wheezing, bilateral rales,rhonchi in all lung fields anteriorly and posteriorly, no crepitations. Using accessory muscles of respiration, especially with speech or movements.  CARDIOVASCULAR: S1, S2 , tachycardic, rhythm is regular. No  murmurs, rubs, or gallops.  ABDOMEN: Soft, nontender, nondistended. Bowel sounds present. No organomegaly or mass.  EXTREMITIES: No pedal edema, cyanosis, or clubbing.  NEUROLOGIC: Cranial nerves II through XII are intact. Muscle strength 5/5 in all extremities. Sensation intact. Gait not checked.  PSYCHIATRIC: The patient is alert and oriented x 3.  SKIN: No obvious rash, lesion, or ulcer.   LABORATORY PANEL:   CBC  Recent Labs Lab 06/21/16 0730  WBC 5.4  HGB 12.2  HCT 35.8  PLT 263  MCV 91.1  MCH 31.0  MCHC 34.1  RDW 13.5  LYMPHSABS 1.8  MONOABS 0.5  EOSABS 0.0  BASOSABS 0.1   ------------------------------------------------------------------------------------------------------------------  Chemistries   Recent Labs Lab 06/21/16 0730  NA 137  K 3.6  CL 106  CO2 19*  GLUCOSE 139*  BUN <5*  CREATININE 0.56  CALCIUM 8.9  AST 37  ALT 17  ALKPHOS 50  BILITOT <0.1*   ------------------------------------------------------------------------------------------------------------------  Cardiac Enzymes  Recent Labs Lab 06/21/16 0730  TROPONINI <0.03   ------------------------------------------------------------------------------------------------------------------  RADIOLOGY: Ct Angio Chest Pe W  Or Wo Contrast  06/21/2016  CLINICAL DATA:  Tachycardia. Shortness of breath. History of lung cancer on chemotherapy. EXAM: CT ANGIOGRAPHY CHEST WITH CONTRAST TECHNIQUE: Multidetector CT imaging of the chest was performed using the standard protocol during bolus administration of intravenous contrast. Multiplanar CT image reconstructions and MIPs were obtained to evaluate the vascular anatomy. CONTRAST:  75 cc Isovue 370 IV. COMPARISON:  Chest radiograph from earlier today. 02/04/2016 CT chest. FINDINGS: Mediastinum/Nodes: Motion degradation limits evaluation of the subsegmental pulmonary arteries. There are no filling defects in the central, lobar, segmental or subsegmental  pulmonary artery branches to suggest acute pulmonary embolism. Atherosclerotic nonaneurysmal thoracic aorta. Normal caliber pulmonary arteries. Normal heart size. No significant pericardial fluid/thickening. No discrete thyroid nodules. Unremarkable esophagus. No pathologically enlarged axillary, mediastinal or hilar lymph nodes. Lungs/Pleura: No pneumothorax. No pleural effusion. No acute consolidative airspace disease or new significant pulmonary nodules. There is stable patchy peribronchovascular interstitial thickening and scattered reticulation throughout the left upper lobe and parahilar left lower lobe, with associated volume loss in the left hemithorax, presumably representing stable post treatment change. Mild centrilobular emphysema. Upper abdomen: Unremarkable. Musculoskeletal: No aggressive appearing focal osseous lesions. Mild thoracic spondylosis. Review of the MIP images confirms the above findings. IMPRESSION: 1. Motion degraded scan.  No evidence of pulmonary embolism . 2. Stable patchy peribronchovascular interstitial thickening, volume loss and patchy reticulation in the left upper lobe and central left lower lobe, presumably representing stable posttreatment change . 3. No evidence of metastatic disease in the chest . 4. Mild centrilobular emphysema . 5. Aortic atherosclerosis . Electronically Signed   By: Ilona Sorrel M.D.   On: 06/21/2016 09:23   US Venous Img Lower Bilateral  06/21/2016  CLINICAL DATA:  Tachycardia.  Dyspnea. EXAM: BILATERAL LOWER EXTREMITY VENOUS DOPPLER ULTRASOUND TECHNIQUE: Gray-scale sonography with graded compression, as well as color Doppler and duplex ultrasound were performed to evaluate the lower extremity deep venous systems from the level of the common femoral vein and including the common femoral, femoral, profunda femoral, popliteal and calf veins including the posterior tibial, peroneal and gastrocnemius veins when visible. The superficial great saphenous vein  was also interrogated. Spectral Doppler was utilized to evaluate flow at rest and with distal augmentation maneuvers in the common femoral, femoral and popliteal veins. COMPARISON:  None. FINDINGS: RIGHT LOWER EXTREMITY Common Femoral Vein: No evidence of thrombus. Normal compressibility, respiratory phasicity and response to augmentation. Saphenofemoral Junction: No evidence of thrombus. Normal compressibility and flow on color Doppler imaging. Profunda Femoral Vein: No evidence of thrombus. Normal compressibility and flow on color Doppler imaging. Femoral Vein: No evidence of thrombus. Normal compressibility, respiratory phasicity and response to augmentation. Popliteal Vein: No evidence of thrombus. Normal compressibility, respiratory phasicity and response to augmentation. Calf Veins: No evidence of thrombus. Normal compressibility and flow on color Doppler imaging. Superficial Great Saphenous Vein: No evidence of thrombus. Normal compressibility and flow on color Doppler imaging. Venous Reflux:  None. Other Findings:  None. LEFT LOWER EXTREMITY Common Femoral Vein: No evidence of thrombus. Normal compressibility, respiratory phasicity and response to augmentation. Saphenofemoral Junction: No evidence of thrombus. Normal compressibility and flow on color Doppler imaging. Profunda Femoral Vein: No evidence of thrombus. Normal compressibility and flow on color Doppler imaging. Femoral Vein: No evidence of thrombus. Normal compressibility, respiratory phasicity and response to augmentation. Popliteal Vein: No evidence of thrombus. Normal compressibility, respiratory phasicity and response to augmentation. Calf Veins: No evidence of thrombus. Normal compressibility and flow on color Doppler imaging. Superficial Great Saphenous  Vein: No evidence of thrombus. Normal compressibility and flow on color Doppler imaging. Venous Reflux:  None. Other Findings:  None. IMPRESSION: No evidence of deep venous thrombosis in  either lower extremity. Electronically Signed   By: Ilona Sorrel M.D.   On: 06/21/2016 10:53   Dg Chest Portable 1 View  06/21/2016  CLINICAL DATA:  Shortness of breath, history of lung carcinoma EXAM: PORTABLE CHEST 1 VIEW COMPARISON:  02/04/2016 FINDINGS: Cardiac shadow is within normal limits. The lungs are well aerated bilaterally. Mild elevation of the left hemidiaphragm is seen. No focal infiltrate or sizable effusion is noted. No bony abnormality is noted. IMPRESSION: No acute abnormality noted. Electronically Signed   By: Inez Catalina M.D.   On: 06/21/2016 07:58    EKG: Orders placed or performed during the hospital encounter of 06/21/16  . EKG 12-Lead  . EKG 12-Lead  . ED EKG  . ED EKG  . EKG 12-Lead  . EKG 12-Lead  . ED EKG  . ED EKG  . EKG 12-Lead  . EKG 12-Lead   EKG in the emergency room reveals sinus tachycardia at rate of 130-135, left atrial enlargement, consider by a enlargement, probable left ventricular hypertrophy, ST elevation, probable normal repolarization pattern. No acute ST-T changes noted   IMPRESSION AND PLAN:  Principal Problem:   Acute respiratory distress (HCC) Active Problems:   COPD exacerbation (HCC)   Sinus tachycardia (HCC)   Recurrent aspiration bronchitis/pneumonia (HCC)   Dysphagia   Nausea and vomiting #1. Acute history distress, likely due to aspiration, admit patient to medical floor. Continue oxygen therapy, initiate steroids, nebulizing therapy, follow clinically, possible discharge in the next 1-2 days if stable #2. COPD exacerbation, continue steroids, nebulizing therapy #3. Aspiration bronchitis, initiate levofloxacin intravenously, get sputum cultures if possible #4. Sinus tachycardia, no pulmonary embolism on CT, Doppler ultrasound is negative for DVT as well,, initiate low-dose of metoprolol. ABGs did not show acidosis.  #5  dysphagia, patient claims eating regular diet, follow her closely, get speech therapist evaluation #6.  Nausea, vomiting, supportive therapy #7 tobacco abuse. Counseling, discussed this patient for 3 minutes, nicotine replacement therapy will be initiated All the records are reviewed and case discussed with ED provider. Management plans discussed with the patient, family and they are in agreement.  CODE STATUS: Code Status History    This patient does not have a recorded code status. Please follow your organizational policy for patients in this situation.       TOTAL TIME TAKING CARE OF THIS PATIENT: 60 minutes.    [name] M.D on 06/21/2016 at 11:15 AM  Between 7am to 6pm - Pager - [phone] After 6pm go to [email or web address] - password [email or web address] [facility]  Office  [phone]  CC: Primary care physician; Donnie Coffin, MD

## 2016-06-21 NOTE — ED Notes (Signed)
Patient transported to Ultrasound 

## 2016-06-22 LAB — CBC
HCT: 32.5 % — ABNORMAL LOW (ref 35.0–47.0)
HEMOGLOBIN: 11.1 g/dL — AB (ref 12.0–16.0)
MCH: 31.3 pg (ref 26.0–34.0)
MCHC: 34.1 g/dL (ref 32.0–36.0)
MCV: 91.9 fL (ref 80.0–100.0)
Platelets: 241 10*3/uL (ref 150–440)
RBC: 3.54 MIL/uL — ABNORMAL LOW (ref 3.80–5.20)
RDW: 14 % (ref 11.5–14.5)
WBC: 5.3 10*3/uL (ref 3.6–11.0)

## 2016-06-22 LAB — BASIC METABOLIC PANEL
ANION GAP: 8 (ref 5–15)
BUN: 9 mg/dL (ref 6–20)
CALCIUM: 8.9 mg/dL (ref 8.9–10.3)
CO2: 24 mmol/L (ref 22–32)
Chloride: 105 mmol/L (ref 101–111)
Creatinine, Ser: 0.47 mg/dL (ref 0.44–1.00)
GFR calc Af Amer: >60 mL/min (ref >60)
GFR calc non Af Amer: >60 mL/min (ref >60)
GLUCOSE: 177 mg/dL — AB (ref 65–99)
Potassium: 3.7 mmol/L (ref 3.5–5.1)
Sodium: 137 mmol/L (ref 135–145)

## 2016-06-22 LAB — TROPONIN I: Troponin I: <0.03 ng/mL (ref <0.03)

## 2016-06-22 MED ORDER — PREDNISONE 10 MG (21) PO TBPK: 10.0000 mg | ORAL_TABLET | Freq: Every day | ORAL | Status: DC

## 2016-06-22 MED ORDER — TIOTROPIUM BROMIDE MONOHYDRATE 18 MCG IN CAPS: 18.0000 ug | ORAL_CAPSULE | Freq: Every day | RESPIRATORY_TRACT | Status: DC

## 2016-06-22 MED ORDER — LEVOFLOXACIN 500 MG PO TABS: 500.0000 mg | ORAL_TABLET | Freq: Every day | ORAL | Status: DC

## 2016-06-22 MED ORDER — METOPROLOL TARTRATE 25 MG PO TABS: 25.0000 mg | ORAL_TABLET | Freq: Two times a day (BID) | ORAL | Status: DC

## 2016-06-22 MED ORDER — LOSARTAN POTASSIUM 100 MG PO TABS: 100.0000 mg | ORAL_TABLET | Freq: Every day | ORAL | Status: DC

## 2016-06-22 NOTE — Discharge Summary (Signed)
Audrey Mcgee, is a 55 y.o. female  DOB 1961/10/12  MRN [id number].  Admission date:  06/21/2016  Admitting Physician  [name], MD  Discharge Date:  06/22/2016   Primary MD  Donnie Coffin, MD  Recommendations for primary care physician for things to follow:  Follow-up with primary doctor in 1 week, follow-up with primary oncologist Dr. Mike Gip in 1 week.   Admission Diagnosis  Tachycardia [R00.0]   Discharge Diagnosis  Tachycardia [R00.0]   Principal Problem:   Acute respiratory distress (HCC) Active Problems:   COPD exacerbation (HCC)   Sinus tachycardia (HCC)   Recurrent aspiration bronchitis/pneumonia (HCC)   Dysphagia   Nausea and vomiting      Past Medical History  Diagnosis Date  . Hypertension   . Lung cancer [facility])     Past Surgical History  Procedure Laterality Date  . Stomach surgery      Pt reports for a tumor       History of present illness and  Hospital Course:     Kindly see H&P for history of present illness and admission details, please review complete Labs, Consult reports and Test reports for all details in brief  HPI  from the history and physical done on the day of admission  55 year old female patient with history of metastatic non-small cell carcinoma along with hoarseness, difficulty swallowing comes in because of shortness of breath, found to have COPD exacerbation and possible aspiration event. Patient started on nebulizers, steroids, antibiotics. Patient also found to have tachycardia with heart rate 1 40 bpm. Patient chest x-ray did not show pneumonia. CT angiogram did not show pulmonary emboli.  Hospital Course  #1 acute respiratory distress secondary to possible aspiration: Patient received oxygen, steroids, follow aspiration precautions. Patient does not have  any hypoxia at this time. Patient is alert awake oriented. Patient did have an episode of vomiting before this happened so it's likely that she might have aspiration aspirated. Discharge her home with tapering course of prednisone, Spiriva, Levaquin. 2.Essential hypertension: Patient ran out of blood pressure medication for the past 3 weeks. Blood pressure is elevated in the hospital up to 150/107. Patient also had tachycardia with heart rate 1 20 bpm. Patient is on losartan at home, we have added metoprolol. Prescriptions are given for losartan, metoprolol for 30 days. #3 chronic pain because of cancer: Continue  Oxycodone and MS contin.   Discharge Condition: stable   Follow UP  Follow-up Information    Follow up with AYCOCK, NGWE A, MD. Schedule an appointment as soon as possible for a visit in 1 week.   Specialty:  Family Medicine   Contact information:   [place] [address] [phone]         Discharge Instructions  and  Discharge Medications       Medication List    TAKE these medications        benzonatate 100 MG capsule  Commonly known as:  TESSALON  Take 1 capsule (100 mg total) by mouth 3 (three) times daily as needed for cough.     dexamethasone 4 MG tablet  Commonly known as:  DECADRON  Take '4mg'$  BID starting day before treatment for a total of 3 days.     DIPHENHYD-LIDOCAINE-NYSTATIN MT  Use as directed 1 Dose in the mouth or throat once a week.     docusate sodium 100 MG capsule  Commonly known as:  COLACE  Take 100 mg by  mouth 2 (two) times daily as needed for mild constipation.     folic acid 235 MCG tablet  Commonly known as:  FOLVITE  Take 800 mcg by mouth daily.     levofloxacin 500 MG tablet  Commonly known as:  LEVAQUIN  Take 1 tablet (500 mg total) by mouth daily.     lidocaine 2 % solution  Commonly known as:  XYLOCAINE  use 10 milliliters by mouth every 4 hours if needed     losartan 100 MG tablet  Commonly  known as:  COZAAR  Take 1 tablet (100 mg total) by mouth daily.     magic mouthwash w/lidocaine Soln  Take 5 mLs by mouth 4 (four) times daily as needed for mouth pain.     magnesium oxide 400 MG tablet  Commonly known as:  MAG-OX  Take 1 tablet (400 mg total) by mouth daily.     megestrol 40 MG/ML suspension  Commonly known as:  MEGACE  take 5 milliliters by mouth twice a day     metoprolol tartrate 25 MG tablet  Commonly known as:  LOPRESSOR  Take 1 tablet (25 mg total) by mouth 2 (two) times daily.     morphine 15 MG 12 hr tablet  Commonly known as:  MS CONTIN  Take 1 tablet (15 mg total) by mouth 2 (two) times daily.     omeprazole 20 MG capsule  Commonly known as:  PRILOSEC  Take 1 capsule (20 mg total) by mouth daily.     ondansetron 8 MG tablet  Commonly known as:  ZOFRAN  Take 1 tablet (8 mg total) by mouth 2 (two) times daily as needed for nausea or vomiting.     Oxycodone HCl 10 MG Tabs  Take 1 tablet (10 mg total) by mouth every 4 (four) hours as needed (pain).     predniSONE 10 MG (21) Tbpk tablet  Commonly known as:  STERAPRED UNI-PAK 21 TAB  Take 1 tablet (10 mg total) by mouth daily. 6 tab lets on day1 5 tab on day 2 4 tab daily for day 3 3 tab daily for 4 2 tab daily for 5 1 tab daily for 6     tiotropium 18 MCG inhalation capsule  Commonly known as:  SPIRIVA  Place 1 capsule (18 mcg total) into inhaler and inhale daily.          Diet and Activity recommendation: See Discharge Instructions above   Consults obtained - none   Major procedures and Radiology Reports - PLEASE review detailed and final reports for all details, in brief -     Ct Angio Chest Pe W Or Wo Contrast  06/21/2016  CLINICAL DATA:  Tachycardia. Shortness of breath. History of lung cancer on chemotherapy. EXAM: CT ANGIOGRAPHY CHEST WITH CONTRAST TECHNIQUE: Multidetector CT imaging of the chest was performed using the standard protocol during bolus administration of intravenous  contrast. Multiplanar CT image reconstructions and MIPs were obtained to evaluate the vascular anatomy. CONTRAST:  75 cc Isovue 370 IV. COMPARISON:  Chest radiograph from earlier today. 02/04/2016 CT chest. FINDINGS: Mediastinum/Nodes: Motion degradation limits evaluation of the subsegmental pulmonary arteries. There are no filling defects in the central, lobar, segmental or subsegmental pulmonary artery branches to suggest acute pulmonary embolism. Atherosclerotic nonaneurysmal thoracic aorta. Normal caliber pulmonary arteries. Normal heart size. No significant pericardial fluid/thickening. No discrete thyroid nodules. Unremarkable esophagus. No pathologically enlarged axillary, mediastinal or hilar lymph nodes. Lungs/Pleura: No pneumothorax. No pleural effusion. No acute  consolidative airspace disease or new significant pulmonary nodules. There is stable patchy peribronchovascular interstitial thickening and scattered reticulation throughout the left upper lobe and parahilar left lower lobe, with associated volume loss in the left hemithorax, presumably representing stable post treatment change. Mild centrilobular emphysema. Upper abdomen: Unremarkable. Musculoskeletal: No aggressive appearing focal osseous lesions. Mild thoracic spondylosis. Review of the MIP images confirms the above findings. IMPRESSION: 1. Motion degraded scan.  No evidence of pulmonary embolism . 2. Stable patchy peribronchovascular interstitial thickening, volume loss and patchy reticulation in the left upper lobe and central left lower lobe, presumably representing stable posttreatment change . 3. No evidence of metastatic disease in the chest . 4. Mild centrilobular emphysema . 5. Aortic atherosclerosis . Electronically Signed   By: Ilona Sorrel M.D.   On: 06/21/2016 09:23   US Venous Img Lower Bilateral  06/21/2016  CLINICAL DATA:  Tachycardia.  Dyspnea. EXAM: BILATERAL LOWER EXTREMITY VENOUS DOPPLER ULTRASOUND TECHNIQUE: Gray-scale  sonography with graded compression, as well as color Doppler and duplex ultrasound were performed to evaluate the lower extremity deep venous systems from the level of the common femoral vein and including the common femoral, femoral, profunda femoral, popliteal and calf veins including the posterior tibial, peroneal and gastrocnemius veins when visible. The superficial great saphenous vein was also interrogated. Spectral Doppler was utilized to evaluate flow at rest and with distal augmentation maneuvers in the common femoral, femoral and popliteal veins. COMPARISON:  None. FINDINGS: RIGHT LOWER EXTREMITY Common Femoral Vein: No evidence of thrombus. Normal compressibility, respiratory phasicity and response to augmentation. Saphenofemoral Junction: No evidence of thrombus. Normal compressibility and flow on color Doppler imaging. Profunda Femoral Vein: No evidence of thrombus. Normal compressibility and flow on color Doppler imaging. Femoral Vein: No evidence of thrombus. Normal compressibility, respiratory phasicity and response to augmentation. Popliteal Vein: No evidence of thrombus. Normal compressibility, respiratory phasicity and response to augmentation. Calf Veins: No evidence of thrombus. Normal compressibility and flow on color Doppler imaging. Superficial Great Saphenous Vein: No evidence of thrombus. Normal compressibility and flow on color Doppler imaging. Venous Reflux:  None. Other Findings:  None. LEFT LOWER EXTREMITY Common Femoral Vein: No evidence of thrombus. Normal compressibility, respiratory phasicity and response to augmentation. Saphenofemoral Junction: No evidence of thrombus. Normal compressibility and flow on color Doppler imaging. Profunda Femoral Vein: No evidence of thrombus. Normal compressibility and flow on color Doppler imaging. Femoral Vein: No evidence of thrombus. Normal compressibility, respiratory phasicity and response to augmentation. Popliteal Vein: No evidence of  thrombus. Normal compressibility, respiratory phasicity and response to augmentation. Calf Veins: No evidence of thrombus. Normal compressibility and flow on color Doppler imaging. Superficial Great Saphenous Vein: No evidence of thrombus. Normal compressibility and flow on color Doppler imaging. Venous Reflux:  None. Other Findings:  None. IMPRESSION: No evidence of deep venous thrombosis in either lower extremity. Electronically Signed   By: Ilona Sorrel M.D.   On: 06/21/2016 10:53   Dg Chest Portable 1 View  06/21/2016  CLINICAL DATA:  Shortness of breath, history of lung carcinoma EXAM: PORTABLE CHEST 1 VIEW COMPARISON:  02/04/2016 FINDINGS: Cardiac shadow is within normal limits. The lungs are well aerated bilaterally. Mild elevation of the left hemidiaphragm is seen. No focal infiltrate or sizable effusion is noted. No bony abnormality is noted. IMPRESSION: No acute abnormality noted. Electronically Signed   By: Inez Catalina M.D.   On: 06/21/2016 07:58    Micro Results    No results found for this or any previous  visit (from the past 240 hour(s)).     Today   Subjective:   Audrey Mcgee today has no headache,no chest abdominal pain,no new weakness tingling or numbness, feels much better wants to go home today.   Objective:   Blood pressure 162/99, pulse 103, temperature 98.5 F (36.9 C), temperature source Oral, resp. rate 15, height '5\' 2"'$  (1.575 m), weight 55.792 kg (123 lb), SpO2 100 %.   Intake/Output Summary (Last 24 hours) at 06/22/16 1137 Last data filed at 06/22/16 0925  Gross per 24 hour  Intake    630 ml  Output   1100 ml  Net   -470 ml    Exam Awake Alert, Oriented x 3, No new F.N deficits, Normal affect [place].AT,PERRAL Supple Neck,No JVD, No cervical lymphadenopathy appriciated.  Symmetrical Chest wall movement, Good air movement bilaterally, CTAB RRR,No Gallops,Rubs or new Murmurs, No Parasternal Heave +ve B.Sounds, Abd Soft, Non tender, No organomegaly  appriciated, No rebound -guarding or rigidity. No Cyanosis, Clubbing or edema, No new Rash or bruise  Data Review   CBC w Diff: Lab Results  Component Value Date   WBC 5.3 06/22/2016   WBC 9.1 04/09/2015   HGB 11.1* 06/22/2016   HGB 13.7 04/09/2015   HCT 32.5* 06/22/2016   HCT 40.9 04/09/2015   PLT 241 06/22/2016   PLT 238 04/09/2015   LYMPHOPCT 34 06/21/2016   LYMPHOPCT 12.4 04/09/2015   BANDSPCT 0 06/21/2016   MONOPCT 10 06/21/2016   MONOPCT 11.5 04/09/2015   EOSPCT 0 06/21/2016   EOSPCT 0.1 04/09/2015   BASOPCT 1 06/21/2016   BASOPCT 0.7 04/09/2015    CMP: Lab Results  Component Value Date   NA 137 06/22/2016   NA 137 04/09/2015   K 3.7 06/22/2016   K 4.2 04/09/2015   CL 105 06/22/2016   CL 103 04/09/2015   CO2 24 06/22/2016   CO2 26 04/09/2015   BUN 9 06/22/2016   BUN 13 04/09/2015   CREATININE 0.47 06/22/2016   CREATININE 0.62 04/09/2015   PROT 6.6 06/21/2016   PROT 8.2* 04/09/2015   ALBUMIN 3.4* 06/21/2016   ALBUMIN 4.0 04/09/2015   BILITOT <0.1* 06/21/2016   BILITOT 0.7 04/09/2015   ALKPHOS 50 06/21/2016   ALKPHOS 79 04/09/2015   AST 37 06/21/2016   AST 19 04/09/2015   ALT 17 06/21/2016   ALT 9* 04/09/2015  .   Total Time in preparing paper work, data evaluation and todays exam - 52 minutes  Talyn Dessert M.D on 06/22/2016 at 11:37 AM    Note: This dictation was prepared with Dragon dictation along with smaller phrase technology. Any transcriptional errors that result from this process are unintentional.

## 2016-06-22 NOTE — Progress Notes (Signed)
Order to d/c pt to home. IV and tele removed. Discharge instructions given to pt and partner with verbal acknowledgment of understanding. Pt escorted off unit via wheelchair by nursing staff.

## 2016-06-26 ENCOUNTER — Other Ambulatory Visit: Payer: Self-pay | Admitting: Hematology and Oncology

## 2016-06-27 ENCOUNTER — Inpatient Hospital Stay: Payer: Medicaid Other

## 2016-06-27 ENCOUNTER — Inpatient Hospital Stay (HOSPITAL_BASED_OUTPATIENT_CLINIC_OR_DEPARTMENT_OTHER): Payer: Medicaid Other | Admitting: Hematology and Oncology

## 2016-06-27 ENCOUNTER — Inpatient Hospital Stay: Payer: Medicaid Other | Attending: Internal Medicine

## 2016-06-27 ENCOUNTER — Other Ambulatory Visit: Payer: Self-pay

## 2016-06-27 ENCOUNTER — Inpatient Hospital Stay: Payer: Medicaid Other | Admitting: Internal Medicine

## 2016-06-27 VITALS — BP 180/107 | HR 89 | Temp 98.0°F | Ht 62.0 in | Wt 125.0 lb

## 2016-06-27 DIAGNOSIS — R1310 Dysphagia, unspecified: Secondary | ICD-10-CM

## 2016-06-27 DIAGNOSIS — R079 Chest pain, unspecified: Secondary | ICD-10-CM | POA: Diagnosis not present

## 2016-06-27 DIAGNOSIS — J3800 Paralysis of vocal cords and larynx, unspecified: Secondary | ICD-10-CM | POA: Diagnosis not present

## 2016-06-27 DIAGNOSIS — Z79899 Other long term (current) drug therapy: Secondary | ICD-10-CM

## 2016-06-27 DIAGNOSIS — R070 Pain in throat: Secondary | ICD-10-CM

## 2016-06-27 DIAGNOSIS — J690 Pneumonitis due to inhalation of food and vomit: Secondary | ICD-10-CM | POA: Insufficient documentation

## 2016-06-27 DIAGNOSIS — R490 Dysphonia: Secondary | ICD-10-CM | POA: Insufficient documentation

## 2016-06-27 DIAGNOSIS — Z809 Family history of malignant neoplasm, unspecified: Secondary | ICD-10-CM | POA: Insufficient documentation

## 2016-06-27 DIAGNOSIS — R590 Localized enlarged lymph nodes: Secondary | ICD-10-CM

## 2016-06-27 DIAGNOSIS — Z7952 Long term (current) use of systemic steroids: Secondary | ICD-10-CM | POA: Insufficient documentation

## 2016-06-27 DIAGNOSIS — R609 Edema, unspecified: Secondary | ICD-10-CM

## 2016-06-27 DIAGNOSIS — Z923 Personal history of irradiation: Secondary | ICD-10-CM | POA: Diagnosis not present

## 2016-06-27 DIAGNOSIS — R51 Headache: Secondary | ICD-10-CM | POA: Insufficient documentation

## 2016-06-27 DIAGNOSIS — C3492 Malignant neoplasm of unspecified part of left bronchus or lung: Secondary | ICD-10-CM | POA: Insufficient documentation

## 2016-06-27 DIAGNOSIS — M25512 Pain in left shoulder: Secondary | ICD-10-CM | POA: Diagnosis not present

## 2016-06-27 DIAGNOSIS — R0602 Shortness of breath: Secondary | ICD-10-CM

## 2016-06-27 DIAGNOSIS — Z87891 Personal history of nicotine dependence: Secondary | ICD-10-CM | POA: Insufficient documentation

## 2016-06-27 DIAGNOSIS — I10 Essential (primary) hypertension: Secondary | ICD-10-CM | POA: Diagnosis not present

## 2016-06-27 DIAGNOSIS — Z9221 Personal history of antineoplastic chemotherapy: Secondary | ICD-10-CM | POA: Diagnosis not present

## 2016-06-27 DIAGNOSIS — C3490 Malignant neoplasm of unspecified part of unspecified bronchus or lung: Secondary | ICD-10-CM

## 2016-06-27 LAB — CBC WITH DIFFERENTIAL/PLATELET
BASOS ABS: 0.1 10*3/uL (ref 0–0.1)
BASOS PCT: 1 %
EOS PCT: 0 %
Eosinophils Absolute: 0 10*3/uL (ref 0–0.7)
HEMATOCRIT: 38.2 % (ref 35.0–47.0)
Hemoglobin: 13 g/dL (ref 12.0–16.0)
LYMPHS PCT: 11 %
Lymphs Abs: 1 10*3/uL (ref 1.0–3.6)
MCH: 31.2 pg (ref 26.0–34.0)
MCHC: 34.1 g/dL (ref 32.0–36.0)
MCV: 91.2 fL (ref 80.0–100.0)
MONO ABS: 1.2 10*3/uL — AB (ref 0.2–0.9)
Monocytes Relative: 12 %
NEUTROS ABS: 7.4 10*3/uL — AB (ref 1.4–6.5)
Neutrophils Relative %: 76 %
PLATELETS: 293 10*3/uL (ref 150–440)
RBC: 4.19 MIL/uL (ref 3.80–5.20)
RDW: 14 % (ref 11.5–14.5)
WBC: 9.7 10*3/uL (ref 3.6–11.0)

## 2016-06-27 LAB — COMPREHENSIVE METABOLIC PANEL
ALBUMIN: 3.5 g/dL (ref 3.5–5.0)
ALT: 15 U/L (ref 14–54)
AST: 19 U/L (ref 15–41)
Alkaline Phosphatase: 55 U/L (ref 38–126)
Anion gap: 6 (ref 5–15)
BUN: 13 mg/dL (ref 6–20)
CHLORIDE: 105 mmol/L (ref 101–111)
CO2: 26 mmol/L (ref 22–32)
CREATININE: 0.53 mg/dL (ref 0.44–1.00)
Calcium: 9.4 mg/dL (ref 8.9–10.3)
GFR calc Af Amer: >60 mL/min (ref >60)
GFR calc non Af Amer: >60 mL/min (ref >60)
GLUCOSE: 139 mg/dL — AB (ref 65–99)
POTASSIUM: 3.6 mmol/L (ref 3.5–5.1)
Sodium: 137 mmol/L (ref 135–145)
Total Bilirubin: 0.3 mg/dL (ref 0.3–1.2)
Total Protein: 6.6 g/dL (ref 6.5–8.1)

## 2016-06-27 LAB — MAGNESIUM: Magnesium: 1.3 mg/dL — ABNORMAL LOW (ref 1.7–2.4)

## 2016-06-27 MED ORDER — OXYCODONE HCL 10 MG PO TABS: 10.0000 mg | ORAL_TABLET | ORAL | Status: DC | PRN

## 2016-06-27 MED ORDER — MORPHINE SULFATE ER 15 MG PO TBCR: 15.0000 mg | EXTENDED_RELEASE_TABLET | Freq: Two times a day (BID) | ORAL | Status: DC

## 2016-06-27 NOTE — Progress Notes (Signed)
Patient here for follow up. She was hospitalized this past week for elevated HR and BP she was discharged on Sunday and has a f/u next Thursday. Patients back and legs have been hurting since discharge. She is also complaining of swollen legs and ankles and face.

## 2016-07-08 ENCOUNTER — Encounter: Payer: Self-pay | Admitting: Hematology and Oncology

## 2016-07-08 ENCOUNTER — Inpatient Hospital Stay: Payer: Medicaid Other

## 2016-07-08 ENCOUNTER — Emergency Department: Payer: Medicaid Other

## 2016-07-08 ENCOUNTER — Emergency Department
Admission: EM | Admit: 2016-07-08 | Discharge: 2016-07-08 | Disposition: A | Discharge status: Home / Self Care | Payer: Medicaid Other | Attending: Emergency Medicine | Admitting: Emergency Medicine

## 2016-07-08 ENCOUNTER — Inpatient Hospital Stay: Payer: Medicaid Other | Admitting: Hematology and Oncology

## 2016-07-08 ENCOUNTER — Encounter: Payer: Self-pay | Admitting: Emergency Medicine

## 2016-07-08 ENCOUNTER — Other Ambulatory Visit: Payer: Self-pay | Admitting: Hematology and Oncology

## 2016-07-08 DIAGNOSIS — Z79899 Other long term (current) drug therapy: Secondary | ICD-10-CM | POA: Insufficient documentation

## 2016-07-08 DIAGNOSIS — J441 Chronic obstructive pulmonary disease with (acute) exacerbation: Secondary | ICD-10-CM | POA: Diagnosis not present

## 2016-07-08 DIAGNOSIS — E0590 Thyrotoxicosis, unspecified without thyrotoxic crisis or storm: Secondary | ICD-10-CM | POA: Diagnosis not present

## 2016-07-08 DIAGNOSIS — I10 Essential (primary) hypertension: Secondary | ICD-10-CM | POA: Insufficient documentation

## 2016-07-08 DIAGNOSIS — R0602 Shortness of breath: Secondary | ICD-10-CM | POA: Insufficient documentation

## 2016-07-08 DIAGNOSIS — Z87891 Personal history of nicotine dependence: Secondary | ICD-10-CM | POA: Insufficient documentation

## 2016-07-08 DIAGNOSIS — Z85118 Personal history of other malignant neoplasm of bronchus and lung: Secondary | ICD-10-CM | POA: Diagnosis not present

## 2016-07-08 DIAGNOSIS — Z7952 Long term (current) use of systemic steroids: Secondary | ICD-10-CM | POA: Insufficient documentation

## 2016-07-08 DIAGNOSIS — R000 Tachycardia, unspecified: Secondary | ICD-10-CM

## 2016-07-08 HISTORY — DX: Chronic obstructive pulmonary disease, unspecified: J44.9

## 2016-07-08 LAB — COMPREHENSIVE METABOLIC PANEL
ALBUMIN: 4.1 g/dL (ref 3.5–5.0)
ALK PHOS: 52 U/L (ref 38–126)
ALT: 12 U/L — AB (ref 14–54)
AST: 23 U/L (ref 15–41)
Anion gap: 10 (ref 5–15)
BILIRUBIN TOTAL: 1.1 mg/dL (ref 0.3–1.2)
BUN: 7 mg/dL (ref 6–20)
CALCIUM: 9.5 mg/dL (ref 8.9–10.3)
CO2: 25 mmol/L (ref 22–32)
CREATININE: 0.38 mg/dL — AB (ref 0.44–1.00)
Chloride: 104 mmol/L (ref 101–111)
GFR calc non Af Amer: >60 mL/min (ref >60)
GLUCOSE: 114 mg/dL — AB (ref 65–99)
Potassium: 3.7 mmol/L (ref 3.5–5.1)
SODIUM: 139 mmol/L (ref 135–145)
TOTAL PROTEIN: 7.6 g/dL (ref 6.5–8.1)

## 2016-07-08 LAB — URINALYSIS COMPLETE WITH MICROSCOPIC (ARMC ONLY)
BILIRUBIN URINE: NEGATIVE
GLUCOSE, UA: 50 mg/dL — AB
Hgb urine dipstick: NEGATIVE
Ketones, ur: NEGATIVE mg/dL
Leukocytes, UA: NEGATIVE
Nitrite: NEGATIVE
PH: 5 (ref 5.0–8.0)
Protein, ur: NEGATIVE mg/dL
Specific Gravity, Urine: 1.04 — ABNORMAL HIGH (ref 1.005–1.030)

## 2016-07-08 LAB — CBC WITH DIFFERENTIAL/PLATELET
Basophils Absolute: 0.1 10*3/uL (ref 0–0.1)
Basophils Relative: 2 %
EOS PCT: 0 %
Eosinophils Absolute: 0 10*3/uL (ref 0–0.7)
HCT: 42.6 % (ref 35.0–47.0)
Hemoglobin: 14.5 g/dL (ref 12.0–16.0)
LYMPHS ABS: 1.7 10*3/uL (ref 1.0–3.6)
LYMPHS PCT: 26 %
MCH: 32 pg (ref 26.0–34.0)
MCHC: 34.1 g/dL (ref 32.0–36.0)
MCV: 93.7 fL (ref 80.0–100.0)
MONO ABS: 1 10*3/uL — AB (ref 0.2–0.9)
Monocytes Relative: 16 %
Neutro Abs: 3.5 10*3/uL (ref 1.4–6.5)
Neutrophils Relative %: 56 %
PLATELETS: 186 10*3/uL (ref 150–440)
RBC: 4.55 MIL/uL (ref 3.80–5.20)
RDW: 14.8 % — AB (ref 11.5–14.5)
WBC: 6.3 10*3/uL (ref 3.6–11.0)

## 2016-07-08 LAB — TSH: TSH: 0.029 u[IU]/mL — AB (ref 0.350–4.500)

## 2016-07-08 LAB — T4, FREE: FREE T4: 1.76 ng/dL — AB (ref 0.61–1.12)

## 2016-07-08 LAB — TROPONIN I: Troponin I: <0.03 ng/mL (ref <0.03)

## 2016-07-08 LAB — LIPASE, BLOOD: Lipase: 21 U/L (ref 11–51)

## 2016-07-08 MED ORDER — IOPAMIDOL (ISOVUE-370) INJECTION 76%
75.0000 mL | Freq: Once | INTRAVENOUS | Status: AC | PRN
  Administered 2016-07-08: 75 mL via INTRAVENOUS

## 2016-07-08 MED ORDER — METOPROLOL TARTRATE 25 MG PO TABS
25.0000 mg | ORAL_TABLET | Freq: Once | ORAL | Status: AC
  Administered 2016-07-08: 25 mg via ORAL
  Filled 2016-07-08: qty 1

## 2016-07-08 MED ORDER — PROPRANOLOL HCL 1 MG/ML IV SOLN: 1.0000 mg | Freq: Once | INTRAVENOUS | Status: DC

## 2016-07-08 MED ORDER — METOPROLOL TARTRATE 25 MG PO TABS
ORAL_TABLET | ORAL | Status: AC
  Filled 2016-07-08: qty 1

## 2016-07-08 MED ORDER — METOPROLOL TARTRATE 25 MG PO TABS: ORAL_TABLET | ORAL | Status: DC

## 2016-07-08 MED ORDER — METOPROLOL TARTRATE 5 MG/5ML IV SOLN
5.0000 mg | Freq: Once | INTRAVENOUS | Status: AC
  Administered 2016-07-08: 5 mg via INTRAVENOUS
  Filled 2016-07-08: qty 5

## 2016-07-08 MED ORDER — SODIUM CHLORIDE 0.9 % IV BOLUS (SEPSIS)
2000.0000 mL | Freq: Once | INTRAVENOUS | Status: AC
  Administered 2016-07-08: 2000 mL via INTRAVENOUS

## 2016-07-08 NOTE — ED Notes (Signed)
Pt placed under bed pan for UA, pt HR became tachy up to 140, pt became more tachypnic. Pt HR came down to 110s several min later. MD notified

## 2016-07-08 NOTE — Discharge Instructions (Signed)
Hyperthyroidism Hyperthyroidism is when the thyroid is too active (overactive). Your thyroid is a large gland that is located in your neck. The thyroid helps to control how your body uses food (metabolism). When your thyroid is overactive, it produces too much of a hormone called thyroxine.  CAUSES Causes of hyperthyroidism may include:  Graves disease. This is when your immune system attacks the thyroid gland. This is the most common cause.  Inflammation of the thyroid gland.  Tumor in the thyroid gland or somewhere else.  Excessive use of thyroid medicines, including:  Prescription thyroid supplement.  Herbal supplements that mimic thyroid hormones.  Solid or fluid-filled lumps within your thyroid gland (thyroid nodules).  Excessive ingestion of iodine. RISK FACTORS  Being female.  Having a family history of thyroid conditions. SIGNS AND SYMPTOMS Signs and symptoms of hyperthyroidism may include:  Nervousness.  Inability to tolerate heat.  Unexplained weight loss.  Diarrhea.  Change in the texture of hair or skin.  Heart skipping beats or making extra beats.  Rapid heart rate.  Loss of menstruation.  Shaky hands.  Fatigue.  Restlessness.  Increased appetite.  Sleep problems.  Enlarged thyroid gland or nodules. DIAGNOSIS  Diagnosis of hyperthyroidism may include:  Medical history and physical exam.  Blood tests.  Ultrasound tests. TREATMENT Treatment may include:  Medicines to control your thyroid.  Surgery to remove your thyroid.  Radiation therapy. HOME CARE INSTRUCTIONS   Take medicines only as directed by your health care provider.  Do not use any tobacco products, including cigarettes, chewing tobacco, or electronic cigarettes. If you need help quitting, ask your health care provider.  Do not exercise or do physical activity until your health care provider approves.  Keep all follow-up appointments as directed by your health care  provider. This is important. SEEK MEDICAL CARE IF:  Your symptoms do not get better with treatment.  You have fever.  You are taking thyroid replacement medicine and you:  Have depression.  Feel mentally and physically slow.  Have weight gain. SEEK IMMEDIATE MEDICAL CARE IF:   You have decreased alertness or a change in your awareness.  You have abdominal pain.  You feel dizzy.  You have a rapid heartbeat.  You have an irregular heartbeat.   This information is not intended to replace advice given to you by your health care provider. Make sure you discuss any questions you have with your health care provider.   Document Released: 12/08/2005 Document Revised: 12/29/2014 Document Reviewed: 04/25/2014 Elsevier Interactive Patient Education 2016 Else[employer]  Nonspecific Tachycardia Tachycardia is a faster than normal heartbeat (more than 100 beats per minute). In adults, the heart normally beats between 60 and 100 times a minute. A fast heartbeat may be a normal response to exercise or stress. It does not necessarily mean that something is wrong. However, sometimes when your heart beats too fast it may not be able to pump enough blood to the rest of your body. This can result in chest pain, shortness of breath, dizziness, and even fainting. Nonspecific tachycardia means that the specific cause or pattern of your tachycardia is unknown. CAUSES  Tachycardia may be harmless or it may be due to a more serious underlying cause. Possible causes of tachycardia include:  Exercise or exertion.  Fever.  Pain or injury.  Infection.  Loss of body fluids (dehydration).  Overactive thyroid.  Lack of red blood cells (anemia).  Anxiety and stress.  Alcohol.  Caffeine.  Tobacco products.  Diet pills.  Illegal drugs.  Heart disease. SYMPTOMS  Rapid or irregular heartbeat (palpitations).  Suddenly feeling your heart beating (cardiac  awareness).  Dizziness.  Tiredness (fatigue).  Shortness of breath.  Chest pain.  Nausea.  Fainting. DIAGNOSIS  Your caregiver will perform a physical exam and take your medical history. In some cases, a heart specialist (cardiologist) may be consulted. Your caregiver may also order:  Blood tests.  Electrocardiography. This test records the electrical activity of your heart.  A heart monitoring test. TREATMENT  Treatment will depend on the likely cause of your tachycardia. The goal is to treat the underlying cause of your tachycardia. Treatment methods may include:  Replacement of fluids or blood through an intravenous (IV) tube for moderate to severe dehydration or anemia.  New medicines or changes in your current medicines.  Diet and lifestyle changes.  Treatment for certain infections.  Stress relief or relaxation methods. HOME CARE INSTRUCTIONS   Rest.  Drink enough fluids to keep your urine clear or pale yellow.  Do not smoke.  Avoid:  Caffeine.  Tobacco.  Alcohol.  Chocolate.  Stimulants such as over-the-counter diet pills or pills that help you stay awake.  Situations that cause anxiety or stress.  Illegal drugs such as marijuana, phencyclidine (PCP), and cocaine.  Only take medicine as directed by your caregiver.  Keep all follow-up appointments as directed by your caregiver. SEEK IMMEDIATE MEDICAL CARE IF:   You have pain in your chest, upper arms, jaw, or neck.  You become weak, dizzy, or feel faint.  You have palpitations that will not go away.  You vomit, have diarrhea, or pass blood in your stool.  Your skin is cool, pale, and wet.  You have a fever that will not go away with rest, fluids, and medicine. MAKE SURE YOU:   Understand these instructions.  Will watch your condition.  Will get help right away if you are not doing well or get worse.   This information is not intended to replace advice given to you by your health  care provider. Make sure you discuss any questions you have with your health care provider.   Document Released: 01/15/2005 Document Revised: 03/01/2012 Document Reviewed: 06/22/2015 Elsevier Interactive Patient Education [employer].

## 2016-07-08 NOTE — ED Notes (Signed)
Pt to ED via EMS from home c/o SOB, pt was tachy at 150s per EMS, pt was given 6 of adenosine en route .  Pt stayed 130-150s. Pt denies any CP, dizziness, or nausea. PT A&O. MD at bedside

## 2016-07-08 NOTE — ED Provider Notes (Addendum)
[facility] Emergency Department Provider Note  ____________________________________________  Time seen: 12:00 PM  I have reviewed the triage vital signs and the nursing notes.   HISTORY  Chief Complaint Shortness of Breath    HPI Audrey Mcgee is a 55 y.o. female who complains of sudden onset of shortness of breath and lightheadedness 30 minutes ago while seated on the couch at rest. Has a history of SVT and was admitted to the hospital about 2 weeks ago for similar symptoms. She also has a history of stage IV lung cancer on chemotherapy. Last chemotherapy was 3 weeks ago and she was scheduled for today but is here due to her acute symptoms. Denies chest pain abdominal pain nausea vomiting. No syncope or falls. No head injuries.  Was given 6 mg of adenosine IV in the hand by EMS without significant effect. EMS report that heart rate was initially 150 or 160.   Past Medical History  Diagnosis Date  . Hypertension   . Lung cancer ([place])   . COPD (chronic obstructive pulmonary disease) [facility])      Patient Active Problem List   Diagnosis Date Noted  . Acute respiratory distress (HCC) 06/21/2016  . COPD exacerbation ([place]) 06/21/2016  . Sinus tachycardia ([place]) 06/21/2016  . Recurrent aspiration bronchitis/pneumonia ([place]) 06/21/2016  . Dysphagia 06/21/2016  . Nausea and vomiting 06/21/2016  . Dental abscess 05/10/2016  . Swallowing difficulty 03/28/2016  . Hypomagnesemia 02/29/2016  . Hoarseness 02/18/2016  . Cancer related pain 07/08/2015  . Adenocarcinoma, lung ([place]) 03/26/2015  . Cancer of lung ([place]) 10/13/2014  . 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 Ear ache 10/03/2014  . Abscess of buccal cavity 10/03/2014  . Lump in neck 10/03/2014  . Laryngeal pain 10/03/2014     Past Surgical History  Procedure Laterality Date  . Stomach surgery      Pt reports for a tumor  . Cesarean section classical       Current  Outpatient Rx  Name  Route  Sig  Dispense  Refill  . benzonatate (TESSALON) 100 MG capsule   Oral   Take 1 capsule (100 mg total) by mouth 3 (three) times daily as needed for cough.   20 capsule   0   . dexamethasone (DECADRON) 4 MG tablet      Take '4mg'$  BID starting day before treatment for a total of 3 days. Patient taking differently: Take 4 mg by mouth 2 (two) times daily. Starting the day before treatment for 3 days   20 tablet   0     Starting day before treatment x3 days   . docusate sodium (COLACE) 100 MG capsule   Oral   Take 100 mg by mouth 2 (two) times daily as needed for mild constipation.         . folic acid (FOLVITE) 938 MCG tablet   Oral   Take 800 mcg by mouth daily.          [name] lidocaine (XYLOCAINE) 2 % solution   Mouth/Throat   Use as directed 10 mLs in the mouth or throat every 4 (four) hours as needed for mouth pain.         [name] losartan (COZAAR) 100 MG tablet   Oral   Take 1 tablet (100 mg total) by mouth daily.   30 tablet   0   . magic mouthwash w/lidocaine SOLN   Oral   Take 5 mLs by mouth 4 (four) times  daily as needed for mouth pain.   480 mL   1   . magnesium oxide (MAG-OX) 400 MG tablet   Oral   Take 1 tablet (400 mg total) by mouth daily.   30 tablet   0   . megestrol (MEGACE) 40 MG/ML suspension   Oral   Take 200 mg by mouth 2 (two) times daily.         [name] morphine (MS CONTIN) 15 MG 12 hr tablet   Oral   Take 1 tablet (15 mg total) by mouth 2 (two) times daily.   60 tablet   0   . omeprazole (PRILOSEC) 20 MG capsule   Oral   Take 1 capsule (20 mg total) by mouth daily.   30 capsule   3   . ondansetron (ZOFRAN) 8 MG tablet   Oral   Take 1 tablet (8 mg total) by mouth 2 (two) times daily as needed for nausea or vomiting.   30 tablet   1   . Oxycodone HCl 10 MG TABS   Oral   Take 1 tablet (10 mg total) by mouth every 4 (four) hours as needed (pain).   60 tablet   0   . tiotropium (SPIRIVA) 18 MCG inhalation  capsule   Inhalation   Place 1 capsule (18 mcg total) into inhaler and inhale daily.   30 capsule   1   . levofloxacin (LEVAQUIN) 500 MG tablet   Oral   Take 1 tablet (500 mg total) by mouth daily. Patient not taking: Reported on 07/08/2016   5 tablet   0     For 5 days only.   . metoprolol tartrate (LOPRESSOR) 25 MG tablet      Take 1 tablet by mouth in the morning and 2 tablets by mouth at night.   90 tablet   0   . predniSONE (STERAPRED UNI-PAK 21 TAB) 10 MG (21) TBPK tablet   Oral   Take 1 tablet (10 mg total) by mouth daily. 6 tab lets on day1 5 tab on day 2 4 tab daily for day 3 3 tab daily for 4 2 tab daily for 5 1 tab daily for 6 Patient not taking: Reported on 07/08/2016   21 tablet   0      Allergies No known allergies   Family History  Problem Relation Age of Onset  . Hypertension Father   . Cancer Father   . Diabetes Father   . Cancer Maternal Aunt     Social History Social History  Substance Use Topics  . Smoking status: Former Smoker -- 15 years    Types: Cigarettes  . Smokeless tobacco: Never Used  . Alcohol Use: No    Review of Systems  Constitutional:   No fever or chills.  ENT:   No sore throat. No rhinorrhea. Cardiovascular:   No chest pain. Respiratory:   Positive shortness of breath. No cough. Gastrointestinal:   Negative for abdominal pain, vomiting and diarrhea.  Genitourinary:   Negative for dysuria or difficulty urinating. Musculoskeletal:   Negative for focal pain or swelling Neurological:   Negative for headaches. Positive lightheadedness  10-point ROS otherwise negative.  ____________________________________________   PHYSICAL EXAM:  VITAL SIGNS: ED Triage Vitals  Enc Vitals Group     BP --      Pulse --      Resp --      Temp --      Temp src --  SpO2 --      Weight --      Height --      Head Cir --      Peak Flow --      Pain Score --      Pain Loc --      Pain Edu? --      Excl. in [place]? --      Vital signs reviewed, nursing assessments reviewed.   Constitutional:   Alert and oriented. Well appearing and in no distress. Eyes:   No scleral icterus. No conjunctival pallor. PERRL. EOMI.  No nystagmus. ENT   Head:   Normocephalic and atraumatic.   Nose:   No congestion/rhinnorhea. No septal hematoma   Mouth/Throat:   MMM, no pharyngeal erythema. No peritonsillar mass.    Neck:   No stridor. No SubQ emphysema. No meningismus. Hematological/Lymphatic/Immunilogical:   No cervical lymphadenopathy. Cardiovascular:   Tachycardia heart rate 1:30, regular. Symmetric bilateral radial and DP pulses.  No murmurs.  Respiratory:   Normal respiratory effort without tachypnea nor retractions. Breath sounds are clear and equal bilaterally. No wheezes/rales/rhonchi. Gastrointestinal:   Soft and nontender. Non distended. There is no CVA tenderness.  No rebound, rigidity, or guarding. Genitourinary:   deferred Musculoskeletal:   Nontender with normal range of motion in all extremities. No joint effusions.  No lower extremity tenderness.  No edema. Neurologic:   Normal speech and language.  CN 2-10 normal. Motor grossly intact. No gross focal neurologic deficits are appreciated.  Skin:    Skin is warm, dry and intact. No rash noted.  No petechiae, purpura, or bullae.  ____________________________________________    LABS (pertinent positives/negatives) (all labs ordered are listed, but only abnormal results are displayed) Labs Reviewed  COMPREHENSIVE METABOLIC PANEL - Abnormal; Notable for the following:    Glucose, Bld 114 (*)    Creatinine, Ser 0.38 (*)    ALT 12 (*)    All other components within normal limits  CBC WITH DIFFERENTIAL/PLATELET - Abnormal; Notable for the following:    RDW 14.8 (*)    Monocytes Absolute 1.0 (*)    All other components within normal limits  URINALYSIS COMPLETEWITH MICROSCOPIC ([facility] ONLY) - Abnormal; Notable for the following:    Color, Urine  STRAW (*)    APPearance CLEAR (*)    Glucose, UA 50 (*)    Specific Gravity, Urine 1.040 (*)    Bacteria, UA RARE (*)    Squamous Epithelial / LPF 0-5 (*)    All other components within normal limits  TSH - Abnormal; Notable for the following:    TSH 0.029 (*)    All other components within normal limits  T4, FREE - Abnormal; Notable for the following:    Free T4 1.76 (*)    All other components within normal limits  LIPASE, BLOOD  TROPONIN I   ____________________________________________   EKG  Sinus tachycardia rate 1:30, normal axis and intervals. Normal QRS ST segments and T waves.  ____________________________________________    RADIOLOGY  CT as a gram chest unremarkable  ____________________________________________   PROCEDURES   ____________________________________________   INITIAL IMPRESSION / ASSESSMENT AND PLAN / ED COURSE  Pertinent labs & imaging results that were available during my care of the patient were reviewed by me and considered in my medical decision making (see chart for details).  Patient presents with sinus tachycardia, shortness of breath, with stage IV lung cancer. We'll get labs, give IV fluids for her tachycardia, CT angiogram to  evaluate for PE. No localizing pain to suggest dissection, ACS, or acute abdomen.   ----------------------------------------- 12:51 PM on 07/08/2016 -----------------------------------------  Heart rate 110 with IV fluids. Oxygen saturation remains 100%. Blood pressure is within normal limits. Labs so far unremarkable. Awaiting CT angiogram.  ----------------------------------------- 4:43 PM on 07/08/2016 -----------------------------------------  CT negative. Orthostatics negative after 2 L IV fluids. Thyroid studies show primary hyperparathyroidism. Afebrile, mental status normal, no thyroid storm. Patient given 1 dose of IV metoprolol along with oral metoprolol, with decrease of heart rate down to the  90s and resolution of her symptoms. She feels much better. Breathing feels better and repeat auscultation shows the lungs are clear to auscultation bilaterally without worsening of her COPD. We'll continue the metoprolol and have her follow-up with primary care. On discharge I see in the electronic medical record that the patient was recently prescribed metoprolol during her recent hospitalization, likely due to this same issue. But however for some reason the patient does not have that medication with her in her pill bottles so there was some disconnect where she either was unaware that she needed to be taking it or did not have the prescription filled.    ____________________________________________   FINAL CLINICAL IMPRESSION(S) / ED DIAGNOSES  Final diagnoses:  Sinus tachycardia ([place])  Hyperthyroidism       Portions of this note were generated with dragon dictation software. Dictation errors may occur despite best attempts at proofreading.   Carrie Mew, MD 07/08/16 1644     ----------------------------------------- 3:13 PM on 07/29/2016 -----------------------------------------  Medical record clarification: EKG that was documented in my encounter no during the patient's evaluation was interpreted by me and considered as part of my medical decision making in providing patient care contemporaneously.  Carrie Mew, MD 07/29/16 [phone]

## 2016-07-08 NOTE — Progress Notes (Signed)
[facility] day:  06/27/2016  Chief Complaint: Audrey Mcgee is a 55 y.o. female with metastatic poorly differentiated non-small cell lung cancer who is seen for assessment prior to cycle #5 maintenance Alimta.  HPI: The patient was last seen in the medical oncology clinic by me on 05/08/2016.  At that time, cycle #4 maintenance Alimta was postponed secondary to tooth abscess on antibiotics.  She had ongoing hoarseness and difficulty swallowing. She had not had her ENT or had her swallowing evaluation. She had stable mild hypomagnesemia (1.6). She had mild visual changes and intermittent headaches.  Her head CT was rescheduled.  She was to follow-up with dentistry regarding additional antibiotics prior to tooth extraction.  She saw Georgeanne Nim, NP on 06/06/2016.  At that time, she was noted to have missed the last couple of treatments due to several abscessed teeth which required extraction as well as antibiotic therapy. All abscessed teeth had been extracted and antibiotics had been completed.  She had missed her ENT evaluation secondary to her teeth.  Her appointment was rescheduled to 06/20/2016.  She noted the need to have more dental work and extractions performed.  She received cycle #4 Alimta.  She was admitted to [facility] from 06/21/2016 - 06/22/2016 with severe shortness of breath secondary to possible aspiration.  Chest CT angiogram on 06/21/2016 revealed no evidence of pulmonary embolism . There was stable patchy peribronchovascular interstitial thickening, volume loss and patchy reticulation in the left upper lobe and central left lower lobe, presumably representing stable posttreatment change.  There was no evidence of metastatic disease in the chest.  Bilateral lower extremity duplex revealed no evidence of thrombosis.  She was discharged on prednisone, Spiriva, and Levaquin.  Metoprolol was added to her Losartan.  Symptomatically, she  notes some swelling in her ankles and face.   Past Medical History  Diagnosis Date  . Hypertension   . Lung cancer Audrey Mcgee - [place])     Past Surgical History  Procedure Laterality Date  . Stomach surgery      Pt reports for a tumor    Family History  Problem Relation Age of Onset  . Hypertension Father   . Cancer Father   . Diabetes Father   . Cancer Maternal Aunt     Social History:  reports that she has quit smoking. Her smoking use included Cigarettes. She quit after 15 years of use. She has never used smokeless tobacco. She reports that she drinks about 1.2 oz of alcohol per week. She reports that she does not use illicit drugs.  Her nephew shot in [place] (caused delays in her treatment).  Her boyfriend's father  died of metastatic lung cancer.  Her boyfriend works at a school with children.  The patient's 1st cousin died recently (age 36) of a cerebral aneurysm.  The patient is alone today.    Allergies:  Allergies  Allergen Reactions  . No Known Allergies     Current Medications: Current Outpatient Prescriptions  Medication Sig Dispense Refill  . benzonatate (TESSALON) 100 MG capsule Take 1 capsule (100 mg total) by mouth 3 (three) times daily as needed for cough. 20 capsule 0  . dexamethasone (DECADRON) 4 MG tablet Take 62m BID starting day before treatment for a total of 3 days. 20 tablet 0  . DIPHENHYD-LIDOCAINE-NYSTATIN MT Use as directed 1 Dose in the mouth or throat once a week.     . docusate sodium (COLACE) 100 MG capsule Take 100  mg by mouth 2 (two) times daily as needed for mild constipation.    . folic acid (FOLVITE) 810 MCG tablet Take 800 mcg by mouth daily.     [name] levofloxacin (LEVAQUIN) 500 MG tablet Take 1 tablet (500 mg total) by mouth daily. 5 tablet 0  . lidocaine (XYLOCAINE) 2 % solution use 10 milliliters by mouth every 4 hours if needed  0  . magic mouthwash w/lidocaine SOLN Take 5 mLs by mouth 4 (four) times daily as needed for mouth pain. 480 mL 1  .  magnesium oxide (MAG-OX) 400 MG tablet Take 1 tablet (400 mg total) by mouth daily. 30 tablet 0  . megestrol (MEGACE) 40 MG/ML suspension take 5 milliliters by mouth twice a day 300 mL 1  . metoprolol tartrate (LOPRESSOR) 25 MG tablet Take 1 tablet (25 mg total) by mouth 2 (two) times daily. 30 tablet 0  . omeprazole (PRILOSEC) 20 MG capsule Take 1 capsule (20 mg total) by mouth daily. 30 capsule 3  . ondansetron (ZOFRAN) 8 MG tablet Take 1 tablet (8 mg total) by mouth 2 (two) times daily as needed for nausea or vomiting. 30 tablet 1  . predniSONE (STERAPRED UNI-PAK 21 TAB) 10 MG (21) TBPK tablet Take 1 tablet (10 mg total) by mouth daily. 6 tab lets on day1 5 tab on day 2 4 tab daily for day 3 3 tab daily for 4 2 tab daily for 5 1 tab daily for 6 21 tablet 0  . tiotropium (SPIRIVA) 18 MCG inhalation capsule Place 1 capsule (18 mcg total) into inhaler and inhale daily. 30 capsule 1  . losartan (COZAAR) 100 MG tablet Take 1 tablet (100 mg total) by mouth daily. (Patient not taking: Reported on 06/27/2016) 30 tablet 0  . morphine (MS CONTIN) 15 MG 12 hr tablet Take 1 tablet (15 mg total) by mouth 2 (two) times daily. 60 tablet 0  . Oxycodone HCl 10 MG TABS Take 1 tablet (10 mg total) by mouth every 4 (four) hours as needed (pain). 60 tablet 0   No current facility-administered medications for this visit.    Review of Systems:  GENERAL: Feels "ok". No fevers or chills. Weight up 5 pounds since 06/06/2016. PERFORMANCE STATUS (ECOG): 1 HEENT: Chronic hoarseness.  Dental abscesses s/p extraction.  No sore throat, mouth sores or tenderness. Lungs:  Shortness of breath with exertion (stable to improved).  Clear cough.  No hemoptysis. Cardiac: No chest pain, palpitations, orthopnea, or PND. GI: Trouble swallowing.  No nausea, vomiting, diarrhea, constipation, melena or hematochezia. GU: No urgency, frequency, dysuria, or hematuria. Musculoskeletal: Back pain under right shoulder. No joint  pain. No muscle tenderness. Extremities: No pain or swelling. Skin: No rashes or skin changes. Neuro: Headache, intermittent.  No numbness or weakness, balance or coordination issues. Endocrine: No diabetes, thyroid issues, hot flashes or night sweats. Psych: No mood changes, depression or anxiety. Pain: Pain in shoulder (stable). Review of systems: All other systems reviewed and found to be negative.  Physical Exam: Blood pressure 180/107, pulse 89, temperature 98 F (36.7 C), temperature source Tympanic, height 5' 2"  (1.575 m), weight 125 lb (56.7 kg).  GENERAL: Thin woman sitting comfortably in the exam room in no acute distress. MENTAL STATUS: Alert and oriented to person, place and time. HEAD: Short brown wig with highlights. Normocephalic, atraumatic, face slightly full, no Cushingoid features.  Left cheek edematous and tender. EYES: Brown eyes. Ruddy sclera.  Pupils equal round and reactive to light and accomodation.  No conjunctivitis or scleral icterus. ENT: Hoarse.  Poor dentition.  Oropharynx clear without lesion. Tongue normal. Mucous membranes moist.  RESPIRATORY: Clear to auscultation without rales, wheezes or rhonchi. CARDIOVASCULAR: Regular rate and rhythm without murmur, rub or gallop. ABDOMEN: Soft, non-tender, with active bowel sounds, and no hepatosplenomegaly. No masses. SKIN: No rashes, ulcers or lesions. EXTREMITIES: Trace ankle edema.  No skin discoloration or tenderness. No palpable cords. LYMPH NODES: No palpable cervical, supraclavicular, axillary or inguinal adenopathy  NEUROLOGICAL: Unremarkable. PSYCH: Appropriate.   Appointment on 06/27/2016  Component Date Value Ref Range Status  . WBC 06/27/2016 9.7  3.6 - 11.0 K/uL Final  . RBC 06/27/2016 4.19  3.80 - 5.20 MIL/uL Final  . Hemoglobin 06/27/2016 13.0  12.0 - 16.0 g/dL Final  . HCT 06/27/2016 38.2  35.0 - 47.0 % Final  . MCV 06/27/2016 91.2  80.0 - 100.0 fL Final  . MCH  06/27/2016 31.2  26.0 - 34.0 pg Final  . MCHC 06/27/2016 34.1  32.0 - 36.0 g/dL Final  . RDW 06/27/2016 14.0  11.5 - 14.5 % Final  . Platelets 06/27/2016 293  150 - 440 K/uL Final  . Neutrophils Relative % 06/27/2016 76   Final  . Neutro Abs 06/27/2016 7.4* 1.4 - 6.5 K/uL Final  . Lymphocytes Relative 06/27/2016 11   Final  . Lymphs Abs 06/27/2016 1.0  1.0 - 3.6 K/uL Final  . Monocytes Relative 06/27/2016 12   Final  . Monocytes Absolute 06/27/2016 1.2* 0.2 - 0.9 K/uL Final  . Eosinophils Relative 06/27/2016 0   Final  . Eosinophils Absolute 06/27/2016 0.0  0 - 0.7 K/uL Final  . Basophils Relative 06/27/2016 1   Final  . Basophils Absolute 06/27/2016 0.1  0 - 0.1 K/uL Final  . Sodium 06/27/2016 137  135 - 145 mmol/L Final  . Potassium 06/27/2016 3.6  3.5 - 5.1 mmol/L Final  . Chloride 06/27/2016 105  101 - 111 mmol/L Final  . CO2 06/27/2016 26  22 - 32 mmol/L Final  . Glucose, Bld 06/27/2016 139* 65 - 99 mg/dL Final  . BUN 06/27/2016 13  6 - 20 mg/dL Final  . Creatinine, Ser 06/27/2016 0.53  0.44 - 1.00 mg/dL Final  . Calcium 06/27/2016 9.4  8.9 - 10.3 mg/dL Final  . Total Protein 06/27/2016 6.6  6.5 - 8.1 g/dL Final  . Albumin 06/27/2016 3.5  3.5 - 5.0 g/dL Final  . AST 06/27/2016 19  15 - 41 U/L Final  . ALT 06/27/2016 15  14 - 54 U/L Final  . Alkaline Phosphatase 06/27/2016 55  38 - 126 U/L Final  . Total Bilirubin 06/27/2016 0.3  0.3 - 1.2 mg/dL Final  . GFR calc non Af Amer 06/27/2016 >60  >60 mL/min Final  . GFR calc Af Amer 06/27/2016 >60  >60 mL/min Final   Comment: (NOTE) The eGFR has been calculated using the CKD EPI equation. This calculation has not been validated in all clinical situations. eGFR's persistently <60 mL/min signify possible Chronic Kidney Disease.   . Anion gap 06/27/2016 6  5 - 15 Final  . Magnesium 06/27/2016 1.3* 1.7 - 2.4 mg/dL Final    Assessment:  Audrey Mcgee is a 55 y.o. female with metastatic poorly differentiated adenocarcinoma of  lung. She presented with a 5 month history of enlarging bilateral neck masses, progressive hoarseness, dysphagia, dyspnea on exertion, cough, left sided hearing loss, neck pain, and a 30 pound weight loss.  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 Biopsy of the right supraclavicular lymph node on 10/03/2014 revealed poorly differentiated non-small cell carcinoma, favoring adenocarcinoma consistent with lung primary (TTF-1 positive). KRAS was positive.   PET scan on 10/24/2014 revealed a left hilar mass with associated left upper lobe obstruction/atelectasis with extensive bilateral mediastinal and cervical adenopathy. Chest CT from 10/10/2014 also noted a 2.2 x 0.9 cm soft tissue density with internal calcifications in the right breast. Head MRI revealed no evidence of metastatic disease.  She received palliative radiation (3000 cGy) to the left lung from 10/24/2014 - 11/07/2014. She has received 3 cycles of carboplatin and Alimta (11/14/2014, 12/05/2014, and 02/13/2015).    She has pain in her left chest, throat, and shoulder. She is on oxycodone 10 mg every 6 hours as needed. Morphine causes nausea. She has chronic shortness of breath with exertion.  She received 6 cycles carboplatin and Alimta (11/14/2014 - 04/30/2015). There was a delay between cycle #2 and cycle #3 secondary to a switch in caregivers.  She was diagnosed with a left peri-hilar infiltrate on 02/23/2015. She has completed a course of Levaquin. Chest CT on 03/21/2015 revealed LUL scarring, patchy ill defined airspace opacities in the lingula, upper esophageal thickening, and small mediastinal and hilar nodes. She completed a course of Azithromycin.  She received 2 cycles of maintenance Alimta (05/22/2015 - 06/15/2015). Chest CT on 07/09/2015 revealed patchy ground glass and nodular consolidation in the left upper lobe and to a lesser extent left lower lobe, minimally progressive  from 03/21/2015.  Bone scan on 07/09/2015 revealed no evidence of metastatic disease.  CXR on 09/24/2015 revealed patchy left perihilar airspace opacities.  She was treated with azithromycin.  Chest CT on 10/17/2015 revealed patchy ground-glass and ill-defined nodular areas of opacity in the left apex, lingula, and anterior left lower lobe generally appeared decreased in the interval although 1 of the nodular areas in the left apex has progressed slightly since the previous study.  Overall imaging features are felt to be most likely treatment related.  There had been an interval slight decrease in proximal to mid esophageal circumferential wall thickening, potentially treatment related.   She was lost to follow-up for approximately 3 months.  Chest and abdomen CT scan on 02/04/2016 revealed evolving radiation changes in the left upper and left lower lobes. Previously measured left upper lobe nodule was no longer visualized. There was no evidence of metastatic disease.  Right hilar lymph nodes were not considered enlarged by CT size criteria and are nonspecific, but appeared new from the prior exam.  Bone scan on 02/19/2016 revealed no definite evidence of metastatic disease. There was subtle uptake within the posterior aspects of the upper ribs bilaterally (nonspecific and not entirely new).  There was a tiny focus of increased uptake near the vertex of the calvarium (new).  She has been hoarse since 02/01/2016.  She has received 4 cycles of maintenance Alimta (restarted 02/29/2016 - 06/06/2016).  She receives B12 every 9 weeks (last 06/06/2016) and is on daily folic acid.   She has ongoing hoarseness and difficulty swallowing.  She has not had her ENT or swallowing evaluations.  She has had delays in therapy secondary to tooth abscesses.  She has mild visual changes and intermittent headaches.  She was admitted to [facility] from 06/21/2016 - 06/22/2016 with severe shortness of breath secondary to possible  aspiration.  Chest CT angiogram on 06/21/2016 revealed no evidence of pulmonary embolism . There was stable patchy peribronchovascular interstitial thickening, volume loss and  patchy reticulation in the left upper lobe and central left lower lobe, presumably representing stable posttreatment change.  Bilateral lower extremity duplex revealed no evidence of thrombosis.  She was discharged on prednisone, Spiriva, and Levaquin.   Symptomatically, she notes some swelling in her ankles and face.  She is on antibiotics for aspiration pneumonia.  Plan: 1.  Labs today:  CBC with diff, CMP, Mg. 2.  Postpone chemotherapy today. 3.  Reschedule ENT consult. 4.  RTC in 7-10 days for MD assessment, labs (CBC with diff, CMP, Mg), and cycle #5 Alimta.   Lequita Asal, MD  06/27/2016

## 2016-07-25 ENCOUNTER — Other Ambulatory Visit: Payer: Self-pay | Admitting: Hematology and Oncology

## 2016-07-25 ENCOUNTER — Encounter: Payer: Self-pay | Admitting: *Deleted

## 2016-07-25 ENCOUNTER — Other Ambulatory Visit: Payer: Self-pay | Admitting: *Deleted

## 2016-07-25 ENCOUNTER — Inpatient Hospital Stay (HOSPITAL_BASED_OUTPATIENT_CLINIC_OR_DEPARTMENT_OTHER): Payer: Medicaid Other | Admitting: Hematology and Oncology

## 2016-07-25 ENCOUNTER — Inpatient Hospital Stay: Payer: Medicaid Other | Attending: Hematology and Oncology

## 2016-07-25 VITALS — BP 152/98 | HR 104 | Temp 98.1°F | Resp 20 | Wt 119.0 lb

## 2016-07-25 DIAGNOSIS — G893 Neoplasm related pain (acute) (chronic): Secondary | ICD-10-CM

## 2016-07-25 DIAGNOSIS — Z809 Family history of malignant neoplasm, unspecified: Secondary | ICD-10-CM | POA: Insufficient documentation

## 2016-07-25 DIAGNOSIS — Z5111 Encounter for antineoplastic chemotherapy: Secondary | ICD-10-CM | POA: Insufficient documentation

## 2016-07-25 DIAGNOSIS — R079 Chest pain, unspecified: Secondary | ICD-10-CM

## 2016-07-25 DIAGNOSIS — R51 Headache: Secondary | ICD-10-CM | POA: Diagnosis not present

## 2016-07-25 DIAGNOSIS — I10 Essential (primary) hypertension: Secondary | ICD-10-CM | POA: Insufficient documentation

## 2016-07-25 DIAGNOSIS — Z9221 Personal history of antineoplastic chemotherapy: Secondary | ICD-10-CM | POA: Diagnosis not present

## 2016-07-25 DIAGNOSIS — J3800 Paralysis of vocal cords and larynx, unspecified: Secondary | ICD-10-CM | POA: Insufficient documentation

## 2016-07-25 DIAGNOSIS — J449 Chronic obstructive pulmonary disease, unspecified: Secondary | ICD-10-CM

## 2016-07-25 DIAGNOSIS — Z87891 Personal history of nicotine dependence: Secondary | ICD-10-CM | POA: Diagnosis not present

## 2016-07-25 DIAGNOSIS — K047 Periapical abscess without sinus: Secondary | ICD-10-CM | POA: Diagnosis not present

## 2016-07-25 DIAGNOSIS — C3490 Malignant neoplasm of unspecified part of unspecified bronchus or lung: Secondary | ICD-10-CM

## 2016-07-25 DIAGNOSIS — R000 Tachycardia, unspecified: Secondary | ICD-10-CM | POA: Insufficient documentation

## 2016-07-25 DIAGNOSIS — R490 Dysphonia: Secondary | ICD-10-CM | POA: Insufficient documentation

## 2016-07-25 DIAGNOSIS — R05 Cough: Secondary | ICD-10-CM | POA: Diagnosis not present

## 2016-07-25 DIAGNOSIS — R1310 Dysphagia, unspecified: Secondary | ICD-10-CM | POA: Insufficient documentation

## 2016-07-25 DIAGNOSIS — C7800 Secondary malignant neoplasm of unspecified lung: Secondary | ICD-10-CM | POA: Diagnosis not present

## 2016-07-25 DIAGNOSIS — C3412 Malignant neoplasm of upper lobe, left bronchus or lung: Secondary | ICD-10-CM | POA: Insufficient documentation

## 2016-07-25 DIAGNOSIS — Z79899 Other long term (current) drug therapy: Secondary | ICD-10-CM | POA: Diagnosis not present

## 2016-07-25 DIAGNOSIS — J479 Bronchiectasis, uncomplicated: Secondary | ICD-10-CM | POA: Diagnosis not present

## 2016-07-25 DIAGNOSIS — R0602 Shortness of breath: Secondary | ICD-10-CM | POA: Insufficient documentation

## 2016-07-25 DIAGNOSIS — C3492 Malignant neoplasm of unspecified part of left bronchus or lung: Secondary | ICD-10-CM

## 2016-07-25 DIAGNOSIS — Z8701 Personal history of pneumonia (recurrent): Secondary | ICD-10-CM

## 2016-07-25 DIAGNOSIS — M25512 Pain in left shoulder: Secondary | ICD-10-CM

## 2016-07-25 DIAGNOSIS — Z923 Personal history of irradiation: Secondary | ICD-10-CM

## 2016-07-25 LAB — COMPREHENSIVE METABOLIC PANEL WITH GFR
ALT: 17 U/L (ref 14–54)
AST: 29 U/L (ref 15–41)
Albumin: 4 g/dL (ref 3.5–5.0)
Alkaline Phosphatase: 57 U/L (ref 38–126)
Anion gap: 8 (ref 5–15)
BUN: 9 mg/dL (ref 6–20)
CO2: 25 mmol/L (ref 22–32)
Calcium: 9.1 mg/dL (ref 8.9–10.3)
Chloride: 100 mmol/L — ABNORMAL LOW (ref 101–111)
Creatinine, Ser: 0.42 mg/dL — ABNORMAL LOW (ref 0.44–1.00)
GFR calc Af Amer: >60 mL/min
GFR calc non Af Amer: >60 mL/min
Glucose, Bld: 101 mg/dL — ABNORMAL HIGH (ref 65–99)
Potassium: 3.8 mmol/L (ref 3.5–5.1)
Sodium: 133 mmol/L — ABNORMAL LOW (ref 135–145)
Total Bilirubin: 0.6 mg/dL (ref 0.3–1.2)
Total Protein: 7.1 g/dL (ref 6.5–8.1)

## 2016-07-25 LAB — CBC WITH DIFFERENTIAL/PLATELET
Basophils Absolute: 0 K/uL (ref 0–0.1)
Basophils Relative: 0 %
Eosinophils Absolute: 0 K/uL (ref 0–0.7)
Eosinophils Relative: 1 %
HCT: 41.9 % (ref 35.0–47.0)
Hemoglobin: 14.5 g/dL (ref 12.0–16.0)
Lymphocytes Relative: 25 %
Lymphs Abs: 1.4 K/uL (ref 1.0–3.6)
MCH: 32.1 pg (ref 26.0–34.0)
MCHC: 34.5 g/dL (ref 32.0–36.0)
MCV: 93.1 fL (ref 80.0–100.0)
Monocytes Absolute: 1 K/uL — ABNORMAL HIGH (ref 0.2–0.9)
Monocytes Relative: 18 %
Neutro Abs: 3.3 K/uL (ref 1.4–6.5)
Neutrophils Relative %: 56 %
Platelets: 187 K/uL (ref 150–440)
RBC: 4.5 MIL/uL (ref 3.80–5.20)
RDW: 14.5 % (ref 11.5–14.5)
WBC: 5.8 K/uL (ref 3.6–11.0)

## 2016-07-25 LAB — MAGNESIUM: Magnesium: 1.4 mg/dL — ABNORMAL LOW (ref 1.7–2.4)

## 2016-07-25 MED ORDER — OXYCODONE HCL 10 MG PO TABS: 10.0000 mg | ORAL_TABLET | ORAL | 0 refills | Status: DC | PRN

## 2016-07-25 MED ORDER — MORPHINE SULFATE ER 15 MG PO TBCR: 15.0000 mg | EXTENDED_RELEASE_TABLET | Freq: Two times a day (BID) | ORAL | 0 refills | Status: DC

## 2016-07-25 NOTE — Progress Notes (Signed)
Patient is here for follow up, she was recently in the hospital. She mentions today refill on Oxycodone and Morphine.

## 2016-07-25 NOTE — Patient Instructions (Signed)
Received refill request from pharmacy for Megace which she just had filled 7/17 Request denied per Dr Mike Gip who asked that I call her to be sure she is taking it correctly. When I called pt, she reports that she still has 13 days worth of med left and the pharmacy sent request not her requesting it.

## 2016-07-25 NOTE — Progress Notes (Signed)
[facility] day:  07/25/2016   Chief Complaint: Shital Crayton is a 55 y.o. female with metastatic poorly differentiated non-small cell lung cancer who is seen for reassessment.  HPI: The patient was last seen in the medical oncology clinic by me on 06/27/2016.  At that time, she had recently been discharged from the hospital.  Cycle #4 maintenance Alimta was held.  She was on antibiotics for aspiration pneumonia.  She was seen in the ER on 07/08/2016 with shortness of breath.  Chest CT angiogram revealed no pulmonary embolism.  There was stable appearance of patchy peribronchovascular patchy interstitial thickening with associated bronchiectasis.  EKG revealed sinus tachycardia.  She was prescribed metoprolol.  She was told that "thyroid was bad".  The patient states that she has not seen an ENT  physician. She is following up with Dr. Clide Deutscher regarding her blood pressure. She is eating okay and gaining weight.   Past Medical History:  Diagnosis Date  . COPD (chronic obstructive pulmonary disease) ([place])   . Hypertension   . Lung cancer [facility])     Past Surgical History:  Procedure Laterality Date  . CESAREAN SECTION CLASSICAL    . STOMACH SURGERY     Pt reports for a tumor    Family History  Problem Relation Age of Onset  . Hypertension Father   . Cancer Father   . Diabetes Father   . Cancer Maternal Aunt     Social History:  reports that she has quit smoking. Her smoking use included Cigarettes. She quit after 15.00 years of use. She has never used smokeless tobacco. She reports that she does not drink alcohol or use drugs.  She stopped smoking.  Her nephew shot in [place] (caused delays in her treatment).  Her boyfriend's father  died of metastatic lung cancer.  Her boyfriend works at a school with children.  The patient's 1st cousin died (age 48) of a cerebral aneurysm.  The patient is alone today.    Allergies:  Allergies   Allergen Reactions  . No Known Allergies     Current Medications: Current Outpatient Prescriptions  Medication Sig Dispense Refill  . benzonatate (TESSALON) 100 MG capsule Take 1 capsule (100 mg total) by mouth 3 (three) times daily as needed for cough. 20 capsule 0  . dexamethasone (DECADRON) 4 MG tablet Take 72m BID starting day before treatment for a total of 3 days. (Patient taking differently: Take 4 mg by mouth 2 (two) times daily. Starting the day before treatment for 3 days) 20 tablet 0  . docusate sodium (COLACE) 100 MG capsule Take 100 mg by mouth 2 (two) times daily as needed for mild constipation.    . folic acid (FOLVITE) 8119MCG tablet Take 800 mcg by mouth daily.     .Marland Kitchenlidocaine (XYLOCAINE) 2 % solution Use as directed 10 mLs in the mouth or throat every 4 (four) hours as needed for mouth pain.    .Marland Kitchenlosartan (COZAAR) 100 MG tablet Take 1 tablet (100 mg total) by mouth daily. 30 tablet 0  . magnesium oxide (MAG-OX) 400 MG tablet Take 1 tablet (400 mg total) by mouth daily. 30 tablet 0  . megestrol (MEGACE) 40 MG/ML suspension Take 200 mg by mouth 2 (two) times daily.    . metoprolol tartrate (LOPRESSOR) 25 MG tablet Take 1 tablet by mouth in the morning and 2 tablets by mouth at night. 90 tablet 0  . morphine (MS  CONTIN) 15 MG 12 hr tablet Take 1 tablet (15 mg total) by mouth 2 (two) times daily. 60 tablet 0  . omeprazole (PRILOSEC) 20 MG capsule Take 1 capsule (20 mg total) by mouth daily. 30 capsule 3  . ondansetron (ZOFRAN) 8 MG tablet Take 1 tablet (8 mg total) by mouth 2 (two) times daily as needed for nausea or vomiting. 30 tablet 1  . Oxycodone HCl 10 MG TABS Take 1 tablet (10 mg total) by mouth every 4 (four) hours as needed (pain). 60 tablet 0  . tiotropium (SPIRIVA) 18 MCG inhalation capsule Place 1 capsule (18 mcg total) into inhaler and inhale daily. 30 capsule 1  . levofloxacin (LEVAQUIN) 500 MG tablet Take 1 tablet (500 mg total) by mouth daily. (Patient not taking:  Reported on 07/25/2016) 5 tablet 0  . magic mouthwash w/lidocaine SOLN Take 5 mLs by mouth 4 (four) times daily as needed for mouth pain. (Patient not taking: Reported on 07/25/2016) 480 mL 1  . predniSONE (STERAPRED UNI-PAK 21 TAB) 10 MG (21) TBPK tablet Take 1 tablet (10 mg total) by mouth daily. 6 tab lets on day1 5 tab on day 2 4 tab daily for day 3 3 tab daily for 4 2 tab daily for 5 1 tab daily for 6 (Patient not taking: Reported on 07/08/2016) 21 tablet 0   No current facility-administered medications for this visit.     Review of Systems:  GENERAL: Feels "ok". No fevers or chills. Weight down 6 pounds. PERFORMANCE STATUS (ECOG): 1 HEENT: Chronic hoarseness.  No sore throat, mouth sores or tenderness. Lungs:  Shortness of breath with exertion (stable).  Clear cough.  No hemoptysis. Cardiac: Tachycardia and BP issues.  No chest pain, palpitations, orthopnea, or PND. GI: Trouble swallowing.  No nausea, vomiting, diarrhea, constipation, melena or hematochezia. GU: No urgency, frequency, dysuria, or hematuria. Musculoskeletal: Back pain under right shoulder. No joint pain. No muscle tenderness. Extremities: No pain or swelling. Skin: No rashes or skin changes. Neuro: Headache, intermittent.  No numbness or weakness, balance or coordination issues. Endocrine: No diabetes, thyroid issues, hot flashes or night sweats. Psych: No mood changes, depression or anxiety. Pain: Pain in shoulder (stable). Review of systems: All other systems reviewed and found to be negative.  Physical Exam: Blood pressure (!) 152/98, pulse (!) 104, temperature 98.1 F (36.7 C), temperature source Tympanic, resp. rate 20, weight 119 lb 0.8 oz (54 kg), SpO2 99 %.  GENERAL: Thin woman sitting comfortably in the exam room in no acute distress. MENTAL STATUS: Alert and oriented to person, place and time. HEAD: Dark curly hair. Normocephalic, atraumatic, no Cushingoid features.  EYES: Brown  eyes. Ruddy sclera.  Pupils equal round and reactive to light and accomodation. No conjunctivitis or scleral icterus. ENT: Hoarse.  Poor dentition.  Oropharynx clear without lesion. Tongue normal. Mucous membranes moist.  RESPIRATORY: Clear to auscultation without rales, wheezes or rhonchi. CARDIOVASCULAR: Regular rate and rhythm without murmur, rub or gallop. ABDOMEN: Soft, non-tender, with active bowel sounds, and no hepatosplenomegaly. No masses. SKIN: No rashes, ulcers or lesions. EXTREMITIES: Trace ankle edema.  No skin discoloration or tenderness. No palpable cords. LYMPH NODES: No palpable cervical, supraclavicular, axillary or inguinal adenopathy  NEUROLOGICAL: Unremarkable. PSYCH: Appropriate.   Appointment on 07/25/2016  Component Date Value Ref Range Status  . WBC 07/25/2016 5.8  3.6 - 11.0 K/uL Final  . RBC 07/25/2016 4.50  3.80 - 5.20 MIL/uL Final  . Hemoglobin 07/25/2016 14.5  12.0 - 16.0 g/dL  Final  . HCT 07/25/2016 41.9  35.0 - 47.0 % Final  . MCV 07/25/2016 93.1  80.0 - 100.0 fL Final  . MCH 07/25/2016 32.1  26.0 - 34.0 pg Final  . MCHC 07/25/2016 34.5  32.0 - 36.0 g/dL Final  . RDW 07/25/2016 14.5  11.5 - 14.5 % Final  . Platelets 07/25/2016 187  150 - 440 K/uL Final  . Neutrophils Relative % 07/25/2016 56  % Final  . Neutro Abs 07/25/2016 3.3  1.4 - 6.5 K/uL Final  . Lymphocytes Relative 07/25/2016 25  % Final  . Lymphs Abs 07/25/2016 1.4  1.0 - 3.6 K/uL Final  . Monocytes Relative 07/25/2016 18  % Final  . Monocytes Absolute 07/25/2016 1.0* 0.2 - 0.9 K/uL Final  . Eosinophils Relative 07/25/2016 1  % Final  . Eosinophils Absolute 07/25/2016 0.0  0 - 0.7 K/uL Final  . Basophils Relative 07/25/2016 0  % Final  . Basophils Absolute 07/25/2016 0.0  0 - 0.1 K/uL Final  . Sodium 07/25/2016 133* 135 - 145 mmol/L Final  . Potassium 07/25/2016 3.8  3.5 - 5.1 mmol/L Final  . Chloride 07/25/2016 100* 101 - 111 mmol/L Final  . CO2 07/25/2016 25  22 - 32 mmol/L  Final  . Glucose, Bld 07/25/2016 101* 65 - 99 mg/dL Final  . BUN 07/25/2016 9  6 - 20 mg/dL Final  . Creatinine, Ser 07/25/2016 0.42* 0.44 - 1.00 mg/dL Final  . Calcium 07/25/2016 9.1  8.9 - 10.3 mg/dL Final  . Total Protein 07/25/2016 7.1  6.5 - 8.1 g/dL Final  . Albumin 07/25/2016 4.0  3.5 - 5.0 g/dL Final  . AST 07/25/2016 29  15 - 41 U/L Final  . ALT 07/25/2016 17  14 - 54 U/L Final  . Alkaline Phosphatase 07/25/2016 57  38 - 126 U/L Final  . Total Bilirubin 07/25/2016 0.6  0.3 - 1.2 mg/dL Final  . GFR calc non Af Amer 07/25/2016 >60  >60 mL/min Final  . GFR calc Af Amer 07/25/2016 >60  >60 mL/min Final   Comment: (NOTE) The eGFR has been calculated using the CKD EPI equation. This calculation has not been validated in all clinical situations. eGFR's persistently <60 mL/min signify possible Chronic Kidney Disease.   . Anion gap 07/25/2016 8  5 - 15 Final  . Magnesium 07/25/2016 1.4* 1.7 - 2.4 mg/dL Final    Assessment:  Alanis Clift is a 55 y.o. female with metastatic poorly differentiated adenocarcinoma of lung. She presented with a 5 month history of enlarging bilateral neck masses, progressive hoarseness, dysphagia, dyspnea on exertion, cough, left sided hearing loss, neck pain, and a 30 pound weight loss.  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 Biopsy of the right supraclavicular lymph node on 10/03/2014 revealed poorly differentiated non-small cell carcinoma, favoring adenocarcinoma consistent with lung primary (TTF-1 positive). KRAS was positive.   PET scan on 10/24/2014 revealed a left hilar mass with associated left upper lobe obstruction/atelectasis with extensive bilateral mediastinal and cervical adenopathy. Chest CT from 10/10/2014 also noted a 2.2 x 0.9 cm soft tissue density with internal calcifications in the right breast. Head MRI revealed no evidence of metastatic disease.  She received palliative radiation  (3000 cGy) to the left lung from 10/24/2014 - 11/07/2014. She has received 3 cycles of carboplatin and Alimta (11/14/2014, 12/05/2014, and 02/13/2015).    She has pain in her left chest, throat, and shoulder. She is on oxycodone 10 mg every 6  hours as needed. Morphine causes nausea. She has chronic shortness of breath with exertion.  She received 6 cycles carboplatin and Alimta (11/14/2014 - 04/30/2015). There was a delay between cycle #2 and cycle #3 secondary to a switch in caregivers.  She was diagnosed with a left peri-hilar infiltrate on 02/23/2015. She has completed a course of Levaquin. Chest CT on 03/21/2015 revealed LUL scarring, patchy ill defined airspace opacities in the lingula, upper esophageal thickening, and small mediastinal and hilar nodes. She completed a course of Azithromycin.  She received 2 cycles of maintenance Alimta (05/22/2015 - 06/15/2015). Chest CT on 07/09/2015 revealed patchy ground glass and nodular consolidation in the left upper lobe and to a lesser extent left lower lobe, minimally progressive from 03/21/2015.  Bone scan on 07/09/2015 revealed no evidence of metastatic disease.  CXR on 09/24/2015 revealed patchy left perihilar airspace opacities.  She was treated with azithromycin.  Chest CT on 10/17/2015 revealed patchy ground-glass and ill-defined nodular areas of opacity in the left apex, lingula, and anterior left lower lobe generally appeared decreased in the interval although 1 of the nodular areas in the left apex has progressed slightly since the previous study.  Overall imaging features are felt to be most likely treatment related.  There had been an interval slight decrease in proximal to mid esophageal circumferential wall thickening, potentially treatment related.   She was lost to follow-up for approximately 3 months.  Chest and abdomen CT scan on 02/04/2016 revealed evolving radiation changes in the left upper and left lower lobes. Previously  measured left upper lobe nodule was no longer visualized. There was no evidence of metastatic disease.  Right hilar lymph nodes were not considered enlarged by CT size criteria and are nonspecific, but appeared new from the prior exam.  Bone scan on 02/19/2016 revealed no definite evidence of metastatic disease. There was subtle uptake within the posterior aspects of the upper ribs bilaterally (nonspecific and not entirely new).  There was a tiny focus of increased uptake near the vertex of the calvarium (new).  She has been hoarse since 02/01/2016.  She has received 4 cycles of maintenance Alimta (restarted 02/29/2016 - 06/06/2016).  She receives B12 every 9 weeks (last 06/06/2016) and is on daily folic acid.   She has ongoing hoarseness and difficulty swallowing.  She has not had her ENT or swallowing evaluations.  She has had delays in therapy secondary to tooth abscesses.  She has mild visual changes and intermittent headaches.  She was [facility] from [facility]/21/2016 - 06/22/2016 with severe shortness of breath secondary to possible aspiration.  Chest CT angiogram on 06/21/2016 revealed no evidence of pulmonary embolism . There was stable patchy peribronchovascular interstitial thickening, volume loss and patchy reticulation in the left upper lobe and central left lower lobe, presumably representing stable posttreatment change.  Bilateral lower extremity duplex revealed no evidence of thrombosis.  She was discharged on prednisone, Spiriva, and Levaquin.   Symptomatically, she is feeling better.  She stopped smoking.  She has hypomagnesemia.  Plan: 1.  Labs today:  CBC with diff, CMP, Mg. 2.  Patient to follow-up with Dr. Clide Deutscher re: thyroid issues. 3.  Reschedule ENT consult. 4.  Increase magnesium to BID. 5.  Refill morphine 15 mg q 12 hours (dis: # 60) and oxycodone 10 mg po q 4 hours prn pain (dis: # 90). 6.  RTC in 1 week for MD , labs (CBC with diff, CMP, Mg), cycle #5 Alimta and B12  injection.  Lequita Asal, MD  07/25/2016, 12:32 PM

## 2016-07-27 ENCOUNTER — Encounter: Payer: Self-pay | Admitting: Hematology and Oncology

## 2016-07-28 ENCOUNTER — Telehealth: Payer: Self-pay | Admitting: *Deleted

## 2016-07-28 NOTE — Telephone Encounter (Signed)
While pt was in clinic on Friday she states she never got appt for ENT, upon calling ENT they did get ref: x2 from Dr. Clide Deutscher office and pt was given both appt and she never showed.  Per pt she never got the appts.  I have asked them if they will make one more appt and tell me and I called [facility] office to make another ref. Due to her medicaid and they will;. Will call pt with new appt.

## 2016-08-01 ENCOUNTER — Inpatient Hospital Stay (HOSPITAL_BASED_OUTPATIENT_CLINIC_OR_DEPARTMENT_OTHER): Payer: Medicaid Other | Admitting: Hematology and Oncology

## 2016-08-01 ENCOUNTER — Inpatient Hospital Stay: Payer: Medicaid Other

## 2016-08-01 ENCOUNTER — Other Ambulatory Visit: Payer: Self-pay | Admitting: *Deleted

## 2016-08-01 ENCOUNTER — Other Ambulatory Visit: Payer: Self-pay | Admitting: Hematology and Oncology

## 2016-08-01 ENCOUNTER — Telehealth: Payer: Self-pay | Admitting: *Deleted

## 2016-08-01 ENCOUNTER — Encounter: Payer: Self-pay | Admitting: Hematology and Oncology

## 2016-08-01 VITALS — BP 142/87 | HR 105 | Temp 97.7°F | Resp 18 | Wt 117.4 lb

## 2016-08-01 DIAGNOSIS — R634 Abnormal weight loss: Secondary | ICD-10-CM

## 2016-08-01 DIAGNOSIS — R490 Dysphonia: Secondary | ICD-10-CM

## 2016-08-01 DIAGNOSIS — J479 Bronchiectasis, uncomplicated: Secondary | ICD-10-CM

## 2016-08-01 DIAGNOSIS — R51 Headache: Secondary | ICD-10-CM | POA: Diagnosis not present

## 2016-08-01 DIAGNOSIS — C3492 Malignant neoplasm of unspecified part of left bronchus or lung: Secondary | ICD-10-CM

## 2016-08-01 DIAGNOSIS — R079 Chest pain, unspecified: Secondary | ICD-10-CM

## 2016-08-01 DIAGNOSIS — Z923 Personal history of irradiation: Secondary | ICD-10-CM

## 2016-08-01 DIAGNOSIS — Z809 Family history of malignant neoplasm, unspecified: Secondary | ICD-10-CM

## 2016-08-01 DIAGNOSIS — Z8701 Personal history of pneumonia (recurrent): Secondary | ICD-10-CM

## 2016-08-01 DIAGNOSIS — C770 Secondary and unspecified malignant neoplasm of lymph nodes of head, face and neck: Secondary | ICD-10-CM

## 2016-08-01 DIAGNOSIS — C3412 Malignant neoplasm of upper lobe, left bronchus or lung: Secondary | ICD-10-CM

## 2016-08-01 DIAGNOSIS — E7800 Pure hypercholesterolemia, unspecified: Secondary | ICD-10-CM | POA: Diagnosis not present

## 2016-08-01 DIAGNOSIS — R000 Tachycardia, unspecified: Secondary | ICD-10-CM

## 2016-08-01 DIAGNOSIS — C3490 Malignant neoplasm of unspecified part of unspecified bronchus or lung: Secondary | ICD-10-CM

## 2016-08-01 DIAGNOSIS — Z5111 Encounter for antineoplastic chemotherapy: Secondary | ICD-10-CM | POA: Diagnosis not present

## 2016-08-01 DIAGNOSIS — J3800 Paralysis of vocal cords and larynx, unspecified: Secondary | ICD-10-CM

## 2016-08-01 DIAGNOSIS — Z87891 Personal history of nicotine dependence: Secondary | ICD-10-CM

## 2016-08-01 DIAGNOSIS — J449 Chronic obstructive pulmonary disease, unspecified: Secondary | ICD-10-CM

## 2016-08-01 DIAGNOSIS — R1310 Dysphagia, unspecified: Secondary | ICD-10-CM

## 2016-08-01 DIAGNOSIS — Z9221 Personal history of antineoplastic chemotherapy: Secondary | ICD-10-CM

## 2016-08-01 DIAGNOSIS — R0602 Shortness of breath: Secondary | ICD-10-CM

## 2016-08-01 DIAGNOSIS — R05 Cough: Secondary | ICD-10-CM

## 2016-08-01 DIAGNOSIS — K047 Periapical abscess without sinus: Secondary | ICD-10-CM

## 2016-08-01 DIAGNOSIS — M25512 Pain in left shoulder: Secondary | ICD-10-CM

## 2016-08-01 DIAGNOSIS — I10 Essential (primary) hypertension: Secondary | ICD-10-CM

## 2016-08-01 DIAGNOSIS — Z79899 Other long term (current) drug therapy: Secondary | ICD-10-CM

## 2016-08-01 LAB — TSH: TSH: 0.037 u[IU]/mL — ABNORMAL LOW (ref 0.350–4.500)

## 2016-08-01 LAB — T4, FREE: Free T4: 2.27 ng/dL — ABNORMAL HIGH (ref 0.61–1.12)

## 2016-08-01 LAB — CBC WITH DIFFERENTIAL/PLATELET
Basophils Absolute: 0 10*3/uL (ref 0–0.1)
Basophils Relative: 0 %
Eosinophils Absolute: 0 10*3/uL (ref 0–0.7)
Eosinophils Relative: 0 %
HCT: 41.1 % (ref 35.0–47.0)
Hemoglobin: 14.2 g/dL (ref 12.0–16.0)
Lymphocytes Relative: 8 %
Lymphs Abs: 0.6 10*3/uL — ABNORMAL LOW (ref 1.0–3.6)
MCH: 31.8 pg (ref 26.0–34.0)
MCHC: 34.6 g/dL (ref 32.0–36.0)
MCV: 91.9 fL (ref 80.0–100.0)
Monocytes Absolute: 0.3 10*3/uL (ref 0.2–0.9)
Monocytes Relative: 4 %
Neutro Abs: 6.2 10*3/uL (ref 1.4–6.5)
Neutrophils Relative %: 88 %
Platelets: 215 10*3/uL (ref 150–440)
RBC: 4.48 MIL/uL (ref 3.80–5.20)
RDW: 13.9 % (ref 11.5–14.5)
WBC: 7.1 10*3/uL (ref 3.6–11.0)

## 2016-08-01 LAB — MAGNESIUM: Magnesium: 1.5 mg/dL — ABNORMAL LOW (ref 1.7–2.4)

## 2016-08-01 LAB — COMPREHENSIVE METABOLIC PANEL
ALT: 19 U/L (ref 14–54)
AST: 28 U/L (ref 15–41)
Albumin: 4.2 g/dL (ref 3.5–5.0)
Alkaline Phosphatase: 49 U/L (ref 38–126)
Anion gap: 10 (ref 5–15)
BUN: 6 mg/dL (ref 6–20)
CO2: 22 mmol/L (ref 22–32)
Calcium: 9.8 mg/dL (ref 8.9–10.3)
Chloride: 107 mmol/L (ref 101–111)
Creatinine, Ser: 0.42 mg/dL — ABNORMAL LOW (ref 0.44–1.00)
GFR calc Af Amer: >60 mL/min (ref >60)
GFR calc non Af Amer: >60 mL/min (ref >60)
Glucose, Bld: 147 mg/dL — ABNORMAL HIGH (ref 65–99)
Potassium: 3.9 mmol/L (ref 3.5–5.1)
Sodium: 139 mmol/L (ref 135–145)
Total Bilirubin: 0.6 mg/dL (ref 0.3–1.2)
Total Protein: 7.6 g/dL (ref 6.5–8.1)

## 2016-08-01 MED ORDER — SODIUM CHLORIDE 0.9 % IV SOLN
Freq: Once | INTRAVENOUS | Status: AC
  Administered 2016-08-01: 12:00:00 via INTRAVENOUS
  Filled 2016-08-01: qty 1000

## 2016-08-01 MED ORDER — CYANOCOBALAMIN 1000 MCG/ML IJ SOLN
1000.0000 ug | Freq: Once | INTRAMUSCULAR | Status: AC
  Administered 2016-08-01: 1000 ug via INTRAMUSCULAR
  Filled 2016-08-01: qty 1

## 2016-08-01 MED ORDER — SODIUM CHLORIDE 0.9 % IV SOLN
500.0000 mg/m2 | Freq: Once | INTRAVENOUS | Status: AC
  Administered 2016-08-01: 750 mg via INTRAVENOUS
  Filled 2016-08-01: qty 20

## 2016-08-01 MED ORDER — SODIUM CHLORIDE 0.9 % IV SOLN
Freq: Once | INTRAVENOUS | Status: AC
  Administered 2016-08-01: 12:00:00 via INTRAVENOUS
  Filled 2016-08-01: qty 4

## 2016-08-01 MED ORDER — MEGESTROL ACETATE 40 MG/ML PO SUSP: 200.0000 mg | Freq: Two times a day (BID) | ORAL | 1 refills | Status: DC

## 2016-08-01 NOTE — Telephone Encounter (Signed)
Attempted to call patient and emergency contacts to review thyroid studies from today. Pt will need to follow up with PCP regarding thyroid function. Unable to reach pt or any other family member with the phone numbers on file. Results have been forwarded to PCP to review on contact patient.

## 2016-08-01 NOTE — Progress Notes (Signed)
[facility] day:  08/01/2016   Chief Complaint: Audrey Mcgee is a 55 y.o. female with metastatic poorly differentiated non-small cell lung cancer who is seen for assessment prior to cycle #5 Alimta.  HPI: The patient was last seen in the medical oncology clinic by me on 07/25/2016.  At that time, she was feeling better.  She had stopped smoking.  She had recently been discharged from the hospital.  She had hypomagnesemia.  Magnesium was increased to BID.  ENT appointment was rescheduled.  Symptomatically, she has felt pretty good.  She notes her "thyroid is acting up".  She has been taking her magnesium.   Past Medical History:  Diagnosis Date  . COPD (chronic obstructive pulmonary disease) ([place])   . Hypertension   . Lung cancer [facility])     Past Surgical History:  Procedure Laterality Date  . CESAREAN SECTION CLASSICAL    . STOMACH SURGERY     Pt reports for a tumor    Family History  Problem Relation Age of Onset  . Hypertension Father   . Cancer Father   . Diabetes Father   . Cancer Maternal Aunt     Social History:  reports that she has quit smoking. Her smoking use included Cigarettes. She quit after 15.00 years of use. She has never used smokeless tobacco. She reports that she does not drink alcohol or use drugs.  She stopped smoking.  Her nephew shot in [place] (caused delays in her treatment).  Her boyfriend's father  died of metastatic lung cancer.  Her boyfriend works at a school with children.  The patient's 1st cousin died (age 2) of a cerebral aneurysm.  The patient is accompanied by her friend, Linton Rump, today.    Allergies:  Allergies  Allergen Reactions  . No Known Allergies     Current Medications: Current Outpatient Prescriptions  Medication Sig Dispense Refill  . benzonatate (TESSALON) 100 MG capsule Take 1 capsule (100 mg total) by mouth 3 (three) times daily as needed for cough. 20 capsule 0  .  dexamethasone (DECADRON) 4 MG tablet Take 18m BID starting day before treatment for a total of 3 days. (Patient taking differently: Take 4 mg by mouth 2 (two) times daily. Starting the day before treatment for 3 days) 20 tablet 0  . docusate sodium (COLACE) 100 MG capsule Take 100 mg by mouth 2 (two) times daily as needed for mild constipation.    . folic acid (FOLVITE) 8761MCG tablet Take 800 mcg by mouth daily.     .Marland Kitchenlidocaine (XYLOCAINE) 2 % solution Use as directed 10 mLs in the mouth or throat every 4 (four) hours as needed for mouth pain.    .Marland Kitchenlosartan (COZAAR) 100 MG tablet Take 1 tablet (100 mg total) by mouth daily. 30 tablet 0  . magic mouthwash w/lidocaine SOLN Take 5 mLs by mouth 4 (four) times daily as needed for mouth pain. 480 mL 1  . magnesium oxide (MAG-OX) 400 MG tablet Take 1 tablet (400 mg total) by mouth daily. 30 tablet 0  . megestrol (MEGACE) 40 MG/ML suspension Take 200 mg by mouth 2 (two) times daily.    . metoprolol tartrate (LOPRESSOR) 25 MG tablet Take 1 tablet by mouth in the morning and 2 tablets by mouth at night. 90 tablet 0  . morphine (MS CONTIN) 15 MG 12 hr tablet Take 1 tablet (15 mg total) by mouth 2 (two) times daily. 60 tablet  0  . omeprazole (PRILOSEC) 20 MG capsule Take 1 capsule (20 mg total) by mouth daily. 30 capsule 3  . ondansetron (ZOFRAN) 8 MG tablet Take 1 tablet (8 mg total) by mouth 2 (two) times daily as needed for nausea or vomiting. 30 tablet 1  . Oxycodone HCl 10 MG TABS Take 1 tablet (10 mg total) by mouth every 4 (four) hours as needed (pain). 60 tablet 0  . predniSONE (STERAPRED UNI-PAK 21 TAB) 10 MG (21) TBPK tablet Take 1 tablet (10 mg total) by mouth daily. 6 tab lets on day1 5 tab on day 2 4 tab daily for day 3 3 tab daily for 4 2 tab daily for 5 1 tab daily for 6 21 tablet 0  . tiotropium (SPIRIVA) 18 MCG inhalation capsule Place 1 capsule (18 mcg total) into inhaler and inhale daily. 30 capsule 1   No current facility-administered  medications for this visit.     Review of Systems:  GENERAL: Feels "ok". No fevers or chills. Weight down 2  pounds. PERFORMANCE STATUS (ECOG): 1 HEENT: Chronic hoarseness (has not seen ENT).  No sore throat, mouth sores or tenderness. Lungs:  No shortness of breath.  Clear cough.  No hemoptysis. Cardiac: Tachycardia and BP issues.  No chest pain, palpitations, orthopnea, or PND. GI: Trouble swallowing.  No nausea, vomiting, diarrhea, constipation, melena or hematochezia. GU: No urgency, frequency, dysuria, or hematuria. Musculoskeletal: Back pain under right shoulder. No joint pain. No muscle tenderness. Extremities: No pain or swelling. Skin: No rashes or skin changes. Neuro: Headache, intermittent.  No numbness or weakness, balance or coordination issues. Endocrine: "Thyroid acting up".  No diabetes, hot flashes or night sweats. Psych: No mood changes, depression or anxiety. Pain: Pain in shoulder (stable). Review of systems: All other systems reviewed and found to be negative.  Physical Exam: Blood pressure (!) 163/99, pulse (!) 112, temperature 97.7 F (36.5 C), temperature source Tympanic, resp. rate 18, weight 117 lb 6.3 oz (53.3 kg).  GENERAL: Thin woman sitting comfortably in the exam room in no acute distress. MENTAL STATUS: Alert and oriented to person, place and time. HEAD: Dark curly hair. Normocephalic, atraumatic, no Cushingoid features.  EYES: Brown eyes. Ruddy sclera.  Pupils equal round and reactive to light and accomodation. No conjunctivitis or scleral icterus. ENT: Hoarse.  Poor dentition.  Oropharynx clear without lesion. Tongue normal. Mucous membranes moist.  RESPIRATORY: Clear to auscultation without rales, wheezes or rhonchi. CARDIOVASCULAR: Regular rate and rhythm without murmur, rub or gallop. ABDOMEN: Soft, non-tender, with active bowel sounds, and no hepatosplenomegaly. No masses. SKIN: No rashes, ulcers or  lesions. EXTREMITIES:  No edema, skin discoloration or tenderness. No palpable cords. LYMPH NODES: No palpable cervical, supraclavicular, axillary or inguinal adenopathy  NEUROLOGICAL: Unremarkable. PSYCH: Appropriate.   Appointment on 08/01/2016  Component Date Value Ref Range Status  . WBC 08/01/2016 7.1  3.6 - 11.0 K/uL Final  . RBC 08/01/2016 4.48  3.80 - 5.20 MIL/uL Final  . Hemoglobin 08/01/2016 14.2  12.0 - 16.0 g/dL Final  . HCT 08/01/2016 41.1  35.0 - 47.0 % Final  . MCV 08/01/2016 91.9  80.0 - 100.0 fL Final  . MCH 08/01/2016 31.8  26.0 - 34.0 pg Final  . MCHC 08/01/2016 34.6  32.0 - 36.0 g/dL Final  . RDW 08/01/2016 13.9  11.5 - 14.5 % Final  . Platelets 08/01/2016 215  150 - 440 K/uL Final  . Neutrophils Relative % 08/01/2016 88  % Final  . Neutro  Abs 08/01/2016 6.2  1.4 - 6.5 K/uL Final  . Lymphocytes Relative 08/01/2016 8  % Final  . Lymphs Abs 08/01/2016 0.6* 1.0 - 3.6 K/uL Final  . Monocytes Relative 08/01/2016 4  % Final  . Monocytes Absolute 08/01/2016 0.3  0.2 - 0.9 K/uL Final  . Eosinophils Relative 08/01/2016 0  % Final  . Eosinophils Absolute 08/01/2016 0.0  0 - 0.7 K/uL Final  . Basophils Relative 08/01/2016 0  % Final  . Basophils Absolute 08/01/2016 0.0  0 - 0.1 K/uL Final  . Sodium 08/01/2016 139  135 - 145 mmol/L Final  . Potassium 08/01/2016 3.9  3.5 - 5.1 mmol/L Final  . Chloride 08/01/2016 107  101 - 111 mmol/L Final  . CO2 08/01/2016 22  22 - 32 mmol/L Final  . Glucose, Bld 08/01/2016 147* 65 - 99 mg/dL Final  . BUN 08/01/2016 6  6 - 20 mg/dL Final  . Creatinine, Ser 08/01/2016 0.42* 0.44 - 1.00 mg/dL Final  . Calcium 08/01/2016 9.8  8.9 - 10.3 mg/dL Final  . Total Protein 08/01/2016 7.6  6.5 - 8.1 g/dL Final  . Albumin 08/01/2016 4.2  3.5 - 5.0 g/dL Final  . AST 08/01/2016 28  15 - 41 U/L Final  . ALT 08/01/2016 19  14 - 54 U/L Final  . Alkaline Phosphatase 08/01/2016 49  38 - 126 U/L Final  . Total Bilirubin 08/01/2016 0.6  0.3 - 1.2 mg/dL  Final  . GFR calc non Af Amer 08/01/2016 >60  >60 mL/min Final  . GFR calc Af Amer 08/01/2016 >60  >60 mL/min Final   Comment: (NOTE) The eGFR has been calculated using the CKD EPI equation. This calculation has not been validated in all clinical situations. eGFR's persistently <60 mL/min signify possible Chronic Kidney Disease.   . Anion gap 08/01/2016 10  5 - 15 Final  . Magnesium 08/01/2016 1.5* 1.7 - 2.4 mg/dL Final    Assessment:  Audianna Landgren is a 55 y.o. female with metastatic poorly differentiated adenocarcinoma of lung. She presented with a 5 month history of enlarging bilateral neck masses, progressive hoarseness, dysphagia, dyspnea on exertion, cough, left sided hearing loss, neck pain, and a 30 pound weight loss.  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 Biopsy of the right supraclavicular lymph node on 10/03/2014 revealed poorly differentiated non-small cell carcinoma, favoring adenocarcinoma consistent with lung primary (TTF-1 positive). KRAS was positive.   PET scan on 10/24/2014 revealed a left hilar mass with associated left upper lobe obstruction/atelectasis with extensive bilateral mediastinal and cervical adenopathy. Chest CT from 10/10/2014 also noted a 2.2 x 0.9 cm soft tissue density with internal calcifications in the right breast. Head MRI revealed no evidence of metastatic disease.  She received palliative radiation (3000 cGy) to the left lung from 10/24/2014 - 11/07/2014. She has received 3 cycles of carboplatin and Alimta (11/14/2014, 12/05/2014, and 02/13/2015).    She has pain in her left chest, throat, and shoulder. She is on oxycodone 10 mg every 6 hours as needed. Morphine causes nausea. She has chronic shortness of breath with exertion.  She received 6 cycles carboplatin and Alimta (11/14/2014 - 04/30/2015). There was a delay between cycle #2 and cycle #3 secondary to a switch in  caregivers.  She was diagnosed with a left peri-hilar infiltrate on 02/23/2015. She has completed a course of Levaquin. Chest CT on 03/21/2015 revealed LUL scarring, patchy ill defined airspace opacities in the lingula, upper esophageal thickening, and  small mediastinal and hilar nodes. She completed a course of Azithromycin.  She received 2 cycles of maintenance Alimta (05/22/2015 - 06/15/2015). Chest CT on 07/09/2015 revealed patchy ground glass and nodular consolidation in the left upper lobe and to a lesser extent left lower lobe, minimally progressive from 03/21/2015.  Bone scan on 07/09/2015 revealed no evidence of metastatic disease.  CXR on 09/24/2015 revealed patchy left perihilar airspace opacities.  She was treated with azithromycin.  Chest CT on 10/17/2015 revealed patchy ground-glass and ill-defined nodular areas of opacity in the left apex, lingula, and anterior left lower lobe generally appeared decreased in the interval although 1 of the nodular areas in the left apex has progressed slightly since the previous study.  Overall imaging features are felt to be most likely treatment related.  There had been an interval slight decrease in proximal to mid esophageal circumferential wall thickening, potentially treatment related.   She was lost to follow-up for approximately 3 months.  Chest and abdomen CT scan on 02/04/2016 revealed evolving radiation changes in the left upper and left lower lobes. Previously measured left upper lobe nodule was no longer visualized. There was no evidence of metastatic disease.  Right hilar lymph nodes were not considered enlarged by CT size criteria and are nonspecific, but appeared new from the prior exam.  Bone scan on 02/19/2016 revealed no definite evidence of metastatic disease. There was subtle uptake within the posterior aspects of the upper ribs bilaterally (nonspecific and not entirely new).  There was a tiny focus of increased uptake near the vertex  of the calvarium (new).  She has been hoarse since 02/01/2016.  She has received 4 cycles of maintenance Alimta (restarted 02/29/2016 - 06/06/2016).  She receives B12 every 9 weeks (last 06/06/2016) and is on daily folic acid.   She has ongoing hoarseness and difficulty swallowing.  She has not had her ENT or swallowing evaluations.  She has had delays in therapy secondary to tooth abscesses.  She has mild visual changes and intermittent headaches.  She was [facility] to [facility] from 06/21/2016 - 06/22/2016 with severe shortness of breath secondary to possible aspiration.  Chest CT angiogram on 06/21/2016 revealed no evidence of pulmonary embolism . There was stable patchy peribronchovascular interstitial thickening, volume loss and patchy reticulation in the left upper lobe and central left lower lobe, presumably representing stable posttreatment change.  Bilateral lower extremity duplex revealed no evidence of thrombosis.  She was discharged on prednisone, Spiriva, and Levaquin.   Symptomatically, she denies any shortness of breath.  She remains hoarse.  She is losing weight,  She has thyroid issues.  She has hypomagnesemia (1.5).  Plan: 1.  Labs today:  CBC with diff, CMP, Mg, TSH, T4. 2.  Cycle #5 Alimta today. 3.  B12 today 4.  Refill Megace 200 mg BID. 5.  Increase magnesium oxide from BID to TID. 6.  RTC in 3 weeks for MD assess, labs (CBC with diff, CMP, Mg), and Alimta   Lequita Asal, MD  08/01/2016, 11:34 AM

## 2016-08-01 NOTE — Progress Notes (Signed)
States is feeling well. Offers no complaints. 

## 2016-08-04 ENCOUNTER — Telehealth: Payer: Self-pay | Admitting: *Deleted

## 2016-08-04 NOTE — Telephone Encounter (Signed)
Called pt to let her know that th staff here tried getting in touch with pt last Friday but was unable to contact her.  I called today to let her know that her tsh and t4 free were abnomal and that staff sent the levels to [facility] on Friday letting them know that labs abnormal and maybe pt needs to be placed on thryoid med.  Pt. States that yest she started shaking in hands and then it goes to her legs and she was told the last time she went to ER that it was her thyroid causing it.  I told her to call [facility] in am and she said she will.

## 2016-08-04 NOTE — Telephone Encounter (Signed)
Pt contacted today and she knows about the tsh and t4 levels and it was sent to PCP office for them to intervene about the levels

## 2016-08-18 ENCOUNTER — Other Ambulatory Visit: Payer: Self-pay | Admitting: *Deleted

## 2016-08-18 DIAGNOSIS — C3490 Malignant neoplasm of unspecified part of unspecified bronchus or lung: Secondary | ICD-10-CM

## 2016-08-18 MED ORDER — OXYCODONE HCL 10 MG PO TABS: 10.0000 mg | ORAL_TABLET | ORAL | 0 refills | Status: DC | PRN

## 2016-08-22 ENCOUNTER — Other Ambulatory Visit: Payer: Self-pay | Admitting: Hematology and Oncology

## 2016-08-22 ENCOUNTER — Other Ambulatory Visit: Payer: Self-pay | Admitting: Otolaryngology

## 2016-08-22 ENCOUNTER — Inpatient Hospital Stay: Payer: Medicaid Other | Attending: Hematology and Oncology

## 2016-08-22 ENCOUNTER — Inpatient Hospital Stay: Payer: Medicaid Other

## 2016-08-22 ENCOUNTER — Inpatient Hospital Stay (HOSPITAL_BASED_OUTPATIENT_CLINIC_OR_DEPARTMENT_OTHER): Payer: Medicaid Other | Admitting: Hematology and Oncology

## 2016-08-22 ENCOUNTER — Encounter: Payer: Self-pay | Admitting: Hematology and Oncology

## 2016-08-22 VITALS — BP 144/89 | HR 88 | Temp 96.2°F | Resp 18 | Wt 123.1 lb

## 2016-08-22 DIAGNOSIS — Z5111 Encounter for antineoplastic chemotherapy: Secondary | ICD-10-CM | POA: Diagnosis present

## 2016-08-22 DIAGNOSIS — Z79899 Other long term (current) drug therapy: Secondary | ICD-10-CM | POA: Diagnosis not present

## 2016-08-22 DIAGNOSIS — Z809 Family history of malignant neoplasm, unspecified: Secondary | ICD-10-CM | POA: Diagnosis not present

## 2016-08-22 DIAGNOSIS — R490 Dysphonia: Secondary | ICD-10-CM

## 2016-08-22 DIAGNOSIS — G893 Neoplasm related pain (acute) (chronic): Secondary | ICD-10-CM | POA: Insufficient documentation

## 2016-08-22 DIAGNOSIS — Z923 Personal history of irradiation: Secondary | ICD-10-CM | POA: Diagnosis not present

## 2016-08-22 DIAGNOSIS — C3490 Malignant neoplasm of unspecified part of unspecified bronchus or lung: Secondary | ICD-10-CM

## 2016-08-22 DIAGNOSIS — C3492 Malignant neoplasm of unspecified part of left bronchus or lung: Secondary | ICD-10-CM | POA: Diagnosis not present

## 2016-08-22 DIAGNOSIS — J3801 Paralysis of vocal cords and larynx, unilateral: Secondary | ICD-10-CM | POA: Insufficient documentation

## 2016-08-22 DIAGNOSIS — R0602 Shortness of breath: Secondary | ICD-10-CM | POA: Insufficient documentation

## 2016-08-22 DIAGNOSIS — R110 Nausea: Secondary | ICD-10-CM

## 2016-08-22 DIAGNOSIS — I10 Essential (primary) hypertension: Secondary | ICD-10-CM

## 2016-08-22 DIAGNOSIS — E0590 Thyrotoxicosis, unspecified without thyrotoxic crisis or storm: Secondary | ICD-10-CM

## 2016-08-22 DIAGNOSIS — R634 Abnormal weight loss: Secondary | ICD-10-CM | POA: Insufficient documentation

## 2016-08-22 DIAGNOSIS — J449 Chronic obstructive pulmonary disease, unspecified: Secondary | ICD-10-CM | POA: Diagnosis not present

## 2016-08-22 DIAGNOSIS — R51 Headache: Secondary | ICD-10-CM

## 2016-08-22 DIAGNOSIS — Z87891 Personal history of nicotine dependence: Secondary | ICD-10-CM | POA: Insufficient documentation

## 2016-08-22 DIAGNOSIS — C770 Secondary and unspecified malignant neoplasm of lymph nodes of head, face and neck: Secondary | ICD-10-CM | POA: Insufficient documentation

## 2016-08-22 DIAGNOSIS — R1310 Dysphagia, unspecified: Secondary | ICD-10-CM | POA: Insufficient documentation

## 2016-08-22 LAB — CBC WITH DIFFERENTIAL/PLATELET
Basophils Absolute: 0 10*3/uL (ref 0–0.1)
Basophils Relative: 1 %
Eosinophils Absolute: 0 10*3/uL (ref 0–0.7)
Eosinophils Relative: 0 %
HCT: 40.8 % (ref 35.0–47.0)
Hemoglobin: 14.1 g/dL (ref 12.0–16.0)
Lymphocytes Relative: 12 %
Lymphs Abs: 0.9 10*3/uL — ABNORMAL LOW (ref 1.0–3.6)
MCH: 32.5 pg (ref 26.0–34.0)
MCHC: 34.6 g/dL (ref 32.0–36.0)
MCV: 93.8 fL (ref 80.0–100.0)
Monocytes Absolute: 0.6 10*3/uL (ref 0.2–0.9)
Monocytes Relative: 9 %
Neutro Abs: 5.8 10*3/uL (ref 1.4–6.5)
Neutrophils Relative %: 78 %
Platelets: 304 10*3/uL (ref 150–440)
RBC: 4.35 MIL/uL (ref 3.80–5.20)
RDW: 14.6 % — ABNORMAL HIGH (ref 11.5–14.5)
WBC: 7.4 10*3/uL (ref 3.6–11.0)

## 2016-08-22 LAB — COMPREHENSIVE METABOLIC PANEL
ALT: 14 U/L (ref 14–54)
AST: 23 U/L (ref 15–41)
Albumin: 3.9 g/dL (ref 3.5–5.0)
Alkaline Phosphatase: 54 U/L (ref 38–126)
Anion gap: 9 (ref 5–15)
BUN: 6 mg/dL (ref 6–20)
CO2: 25 mmol/L (ref 22–32)
Calcium: 9.4 mg/dL (ref 8.9–10.3)
Chloride: 104 mmol/L (ref 101–111)
Creatinine, Ser: 0.51 mg/dL (ref 0.44–1.00)
GFR calc Af Amer: >60 mL/min (ref >60)
GFR calc non Af Amer: >60 mL/min (ref >60)
Glucose, Bld: 134 mg/dL — ABNORMAL HIGH (ref 65–99)
Potassium: 3.9 mmol/L (ref 3.5–5.1)
Sodium: 138 mmol/L (ref 135–145)
Total Bilirubin: 0.5 mg/dL (ref 0.3–1.2)
Total Protein: 7.4 g/dL (ref 6.5–8.1)

## 2016-08-22 LAB — MAGNESIUM: Magnesium: 1.5 mg/dL — ABNORMAL LOW (ref 1.7–2.4)

## 2016-08-22 MED ORDER — ONDANSETRON HCL 8 MG PO TABS: 8.0000 mg | ORAL_TABLET | Freq: Two times a day (BID) | ORAL | 1 refills | Status: DC | PRN

## 2016-08-22 MED ORDER — SODIUM CHLORIDE 0.9 % IV SOLN
500.0000 mg/m2 | Freq: Once | INTRAVENOUS | Status: AC
  Administered 2016-08-22: 750 mg via INTRAVENOUS
  Filled 2016-08-22: qty 30
  Filled 2016-08-22: qty 20

## 2016-08-22 MED ORDER — SODIUM CHLORIDE 0.9 % IV SOLN
Freq: Once | INTRAVENOUS | Status: AC
  Administered 2016-08-22: 12:00:00 via INTRAVENOUS
  Filled 2016-08-22: qty 4

## 2016-08-22 MED ORDER — SODIUM CHLORIDE 0.9 % IV SOLN
Freq: Once | INTRAVENOUS | Status: AC
  Administered 2016-08-22: 12:00:00 via INTRAVENOUS
  Filled 2016-08-22: qty 1000

## 2016-08-22 NOTE — Progress Notes (Signed)
[facility] day:  08/22/2016   Chief Complaint: Audrey Mcgee is a 55 y.o. female with metastatic poorly differentiated non-small cell lung cancer who is seen for assessment prior to cycle #6 Alimta.  HPI: The patient was last seen in the medical oncology clinic by me on 08/01/2016.  At that time, she was feeling better, but losing weight.  She received cycle #5 Alimta.  She was received a prescription for Megace.  She was to have increased her magnesium from BID to TID.  Her ENT appointment was rescheduled.  She states that she met with ENT.  She notes left vocal cord paralysis.  She is having a neck CT scheduled on 09/03/2016.  She may have surgery.  Symptomatically, she feels that "everything is alright".  She notes shortness of breath with exertion.  She has been taking magnesium BID.   Past Medical History:  Diagnosis Date  . COPD (chronic obstructive pulmonary disease) ([place])   . Hypertension   . Lung cancer [facility])     Past Surgical History:  Procedure Laterality Date  . CESAREAN SECTION CLASSICAL    . STOMACH SURGERY     Pt reports for a tumor    Family History  Problem Relation Age of Onset  . Hypertension Father   . Cancer Father   . Diabetes Father   . Cancer Maternal Aunt     Social History:  reports that she has quit smoking. Her smoking use included Cigarettes. She quit after 15.00 years of use. She has never used smokeless tobacco. She reports that she does not drink alcohol or use drugs.  She stopped smoking.  Her nephew shot in [place] (caused delays in her treatment).  Her boyfriend's father  died of metastatic lung cancer.  Her boyfriend works at a school with children.  The patient's 1st cousin died (age 23) of a cerebral aneurysm.  The patient is accompanied by her boyfriend, Audrey Mcgee, today.    Allergies:  Allergies  Allergen Reactions  . No Known Allergies     Current Medications: Current Outpatient  Prescriptions  Medication Sig Dispense Refill  . benzonatate (TESSALON) 100 MG capsule Take 1 capsule (100 mg total) by mouth 3 (three) times daily as needed for cough. 20 capsule 0  . dexamethasone (DECADRON) 4 MG tablet Take 31m BID starting day before treatment for a total of 3 days. (Patient taking differently: Take 4 mg by mouth 2 (two) times daily. Starting the day before treatment for 3 days) 20 tablet 0  . docusate sodium (COLACE) 100 MG capsule Take 100 mg by mouth 2 (two) times daily as needed for mild constipation.    . folic acid (FOLVITE) 8295MCG tablet Take 800 mcg by mouth daily.     .Marland Kitchenlidocaine (XYLOCAINE) 2 % solution Use as directed 10 mLs in the mouth or throat every 4 (four) hours as needed for mouth pain.    .Marland Kitchenlosartan (COZAAR) 100 MG tablet Take 1 tablet (100 mg total) by mouth daily. 30 tablet 0  . magnesium oxide (MAG-OX) 400 MG tablet Take 1 tablet (400 mg total) by mouth daily. 30 tablet 0  . megestrol (MEGACE) 40 MG/ML suspension Take 5 mLs (200 mg total) by mouth 2 (two) times daily. 300 mL 1  . methimazole (TAPAZOLE) 5 MG tablet take 1 tablet by mouth twice a day for OVERACTIVE THYROID  0  . metoprolol (LOPRESSOR) 50 MG tablet take 1 tablet by mouth  twice a day NOTE DOSE INCREASE  0  . morphine (MS CONTIN) 15 MG 12 hr tablet Take 1 tablet (15 mg total) by mouth 2 (two) times daily. 60 tablet 0  . omeprazole (PRILOSEC) 20 MG capsule Take 1 capsule (20 mg total) by mouth daily. 30 capsule 3  . ondansetron (ZOFRAN) 8 MG tablet Take 1 tablet (8 mg total) by mouth 2 (two) times daily as needed for nausea or vomiting. 30 tablet 1  . Oxycodone HCl 10 MG TABS Take 1 tablet (10 mg total) by mouth every 4 (four) hours as needed (pain). 60 tablet 0  . tiotropium (SPIRIVA) 18 MCG inhalation capsule Place 1 capsule (18 mcg total) into inhaler and inhale daily. 30 capsule 1  . magic mouthwash w/lidocaine SOLN Take 5 mLs by mouth 4 (four) times daily as needed for mouth pain. (Patient  not taking: Reported on 08/22/2016) 480 mL 1  . predniSONE (STERAPRED UNI-PAK 21 TAB) 10 MG (21) TBPK tablet Take 1 tablet (10 mg total) by mouth daily. 6 tab lets on day1 5 tab on day 2 4 tab daily for day 3 3 tab daily for 4 2 tab daily for 5 1 tab daily for 6 (Patient not taking: Reported on 08/22/2016) 21 tablet 0   No current facility-administered medications for this visit.     Review of Systems:  GENERAL: Feels "good". No fevers or chills. Weight up 6 pounds. PERFORMANCE STATUS (ECOG): 1 HEENT: Chronic hoarseness (saw ENT)  No sore throat, mouth sores or tenderness. Lungs:  Shortness of breath with exertion.  Clear cough.  No hemoptysis. Cardiac: No chest pain, palpitations, orthopnea, or PND. GI: Nausea post chemotherapy.  No vomiting, diarrhea, constipation, melena or hematochezia. GU: No urgency, frequency, dysuria, or hematuria. Musculoskeletal: Back pain under right shoulder. No joint pain. No muscle tenderness. Extremities: No pain or swelling. Skin: No rashes or skin changes. Neuro: Headache (rare).  No numbness or weakness, balance or coordination issues. Endocrine: No diabetes.  Thyroid issues.  No hot flashes or night sweats. Psych: No mood changes, depression or anxiety. Pain: Pain in shoulder (stable). Review of systems: All other systems reviewed and found to be negative.  Physical Exam: Blood pressure (!) 144/89, pulse 88, temperature (!) 96.2 F (35.7 C), temperature source Tympanic, resp. rate 18, weight 123 lb 2 oz (55.8 kg), SpO2 100 %.  GENERAL: Thin woman sitting comfortably in the exam room in no acute distress. MENTAL STATUS: Alert and oriented to person, place and time. HEAD: Dark curly hair. Normocephalic, atraumatic, no Cushingoid features.  EYES: Brown eyes. Ruddy sclera.  Pupils equal round and reactive to light and accomodation. No conjunctivitis or scleral icterus. ENT: Hoarse.  Poor dentition.  Oropharynx clear without  lesion. Tongue normal. Mucous membranes moist.  RESPIRATORY: Clear to auscultation without rales, wheezes or rhonchi. CARDIOVASCULAR: Regular rate and rhythm without murmur, rub or gallop. ABDOMEN: Soft, non-tender, with active bowel sounds, and no hepatosplenomegaly. No masses. SKIN: No rashes, ulcers or lesions. EXTREMITIES: No edema, skin discoloration or tenderness. No palpable cords. LYMPH NODES: No palpable cervical, supraclavicular, axillary or inguinal adenopathy  NEUROLOGICAL: Unremarkable. PSYCH: Appropriate.   Appointment on 08/22/2016  Component Date Value Ref Range Status  . Magnesium 08/22/2016 1.5* 1.7 - 2.4 mg/dL Final  . Sodium 08/22/2016 138  135 - 145 mmol/L Final  . Potassium 08/22/2016 3.9  3.5 - 5.1 mmol/L Final  . Chloride 08/22/2016 104  101 - 111 mmol/L Final  . CO2 08/22/2016 25  22 - 32 mmol/L Final  . Glucose, Bld 08/22/2016 134* 65 - 99 mg/dL Final  . BUN 08/22/2016 6  6 - 20 mg/dL Final  . Creatinine, Ser 08/22/2016 0.51  0.44 - 1.00 mg/dL Final  . Calcium 08/22/2016 9.4  8.9 - 10.3 mg/dL Final  . Total Protein 08/22/2016 7.4  6.5 - 8.1 g/dL Final  . Albumin 08/22/2016 3.9  3.5 - 5.0 g/dL Final  . AST 08/22/2016 23  15 - 41 U/L Final  . ALT 08/22/2016 14  14 - 54 U/L Final  . Alkaline Phosphatase 08/22/2016 54  38 - 126 U/L Final  . Total Bilirubin 08/22/2016 0.5  0.3 - 1.2 mg/dL Final  . GFR calc non Af Amer 08/22/2016 >60  >60 mL/min Final  . GFR calc Af Amer 08/22/2016 >60  >60 mL/min Final   Comment: (NOTE) The eGFR has been calculated using the CKD EPI equation. This calculation has not been validated in all clinical situations. eGFR's persistently <60 mL/min signify possible Chronic Kidney Disease.   . Anion gap 08/22/2016 9  5 - 15 Final  . WBC 08/22/2016 7.4  3.6 - 11.0 K/uL Final  . RBC 08/22/2016 4.35  3.80 - 5.20 MIL/uL Final  . Hemoglobin 08/22/2016 14.1  12.0 - 16.0 g/dL Final  . HCT 08/22/2016 40.8  35.0 - 47.0 % Final  .  MCV 08/22/2016 93.8  80.0 - 100.0 fL Final  . MCH 08/22/2016 32.5  26.0 - 34.0 pg Final  . MCHC 08/22/2016 34.6  32.0 - 36.0 g/dL Final  . RDW 08/22/2016 14.6* 11.5 - 14.5 % Final  . Platelets 08/22/2016 304  150 - 440 K/uL Final  . Neutrophils Relative % 08/22/2016 78  % Final  . Neutro Abs 08/22/2016 5.8  1.4 - 6.5 K/uL Final  . Lymphocytes Relative 08/22/2016 12  % Final  . Lymphs Abs 08/22/2016 0.9* 1.0 - 3.6 K/uL Final  . Monocytes Relative 08/22/2016 9  % Final  . Monocytes Absolute 08/22/2016 0.6  0.2 - 0.9 K/uL Final  . Eosinophils Relative 08/22/2016 0  % Final  . Eosinophils Absolute 08/22/2016 0.0  0 - 0.7 K/uL Final  . Basophils Relative 08/22/2016 1  % Final  . Basophils Absolute 08/22/2016 0.0  0 - 0.1 K/uL Final    Assessment:  Antaniya Venuti is a 55 y.o. female with metastatic poorly differentiated adenocarcinoma of lung. She presented with a 5 month history of enlarging bilateral neck masses, progressive hoarseness, dysphagia, dyspnea on exertion, cough, left sided hearing loss, neck pain, and a 30 pound weight loss.  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 Biopsy of the right supraclavicular lymph node on 10/03/2014 revealed poorly differentiated non-small cell carcinoma, favoring adenocarcinoma consistent with lung primary (TTF-1 positive). KRAS was positive.   PET scan on 10/24/2014 revealed a left hilar mass with associated left upper lobe obstruction/atelectasis with extensive bilateral mediastinal and cervical adenopathy. Chest CT from 10/10/2014 also noted a 2.2 x 0.9 cm soft tissue density with internal calcifications in the right breast. Head MRI revealed no evidence of metastatic disease.  She received palliative radiation (3000 cGy) to the left lung from 10/24/2014 - 11/07/2014. She has received 3 cycles of carboplatin and Alimta (11/14/2014, 12/05/2014, and 02/13/2015).    She has pain in her left chest,  throat, and shoulder. She is on oxycodone 10 mg every 6 hours as needed. Morphine causes nausea. She has chronic shortness of breath with exertion.  She received 6 cycles carboplatin and Alimta (11/14/2014 - 04/30/2015). There was a delay between cycle #2 and cycle #3 secondary to a switch in caregivers.  She was diagnosed with a left peri-hilar infiltrate on 02/23/2015. She has completed a course of Levaquin. Chest CT on 03/21/2015 revealed LUL scarring, patchy ill defined airspace opacities in the lingula, upper esophageal thickening, and small mediastinal and hilar nodes. She completed a course of Azithromycin.  She received 2 cycles of maintenance Alimta (05/22/2015 - 06/15/2015). Chest CT on 07/09/2015 revealed patchy ground glass and nodular consolidation in the left upper lobe and to a lesser extent left lower lobe, minimally progressive from 03/21/2015.  Bone scan on 07/09/2015 revealed no evidence of metastatic disease.  CXR on 09/24/2015 revealed patchy left perihilar airspace opacities.  She was treated with azithromycin.  Chest CT on 10/17/2015 revealed patchy ground-glass and ill-defined nodular areas of opacity in the left apex, lingula, and anterior left lower lobe generally appeared decreased in the interval although 1 of the nodular areas in the left apex has progressed slightly since the previous study.  Overall imaging features are felt to be most likely treatment related.  There had been an interval slight decrease in proximal to mid esophageal circumferential wall thickening, potentially treatment related.   She was lost to follow-up for approximately 3 months.  Chest and abdomen CT scan on 02/04/2016 revealed evolving radiation changes in the left upper and left lower lobes. Previously measured left upper lobe nodule was no longer visualized. There was no evidence of metastatic disease.  Right hilar lymph nodes were not considered enlarged by CT size criteria and are  nonspecific, but appeared new from the prior exam.  Bone scan on 02/19/2016 revealed no definite evidence of metastatic disease. There was subtle uptake within the posterior aspects of the upper ribs bilaterally (nonspecific and not entirely new).  There was a tiny focus of increased uptake near the vertex of the calvarium (new).  She has been hoarse since 02/01/2016.  She has received 5 cycles of maintenance Alimta (restarted 02/29/2016 - 06/06/2016; 08/01/2016).  She receives B12 every 9 weeks (last 08/01/2016) and is on daily folic acid.   She has ongoing hoarseness and difficulty swallowing.  She has not had her ENT or swallowing evaluations.  She has had delays in therapy secondary to tooth abscesses.  She has mild visual changes and intermittent headaches.  She was admitted to [facility] from 06/21/2016 - 06/22/2016 with severe shortness of breath secondary to possible aspiration.  Chest CT angiogram on 06/21/2016 revealed no evidence of pulmonary embolism . There was stable patchy peribronchovascular interstitial thickening, volume loss and patchy reticulation in the left upper lobe and central left lower lobe, presumably representing stable posttreatment change.  Bilateral lower extremity duplex revealed no evidence of thrombosis.  She was discharged on prednisone, Spiriva, and Levaquin.   She has hyperthyroidism.  She is on methimazole.  TSH was 0.037 (low) and free T4 2.27 (high) on 08/01/2016.    Symptomatically, she feels good.  Weight has improved on Megace.  She has hypomagnesemia (1.5).  Plan: 1.  Labs today:  CBC with diff, CMP, Mg, CEA. 2.  Cycle #6 Alimta today. 3.  Follow-up with ENT. 4.  Increase magnesium oxide to TID. 5.  Rx:  ondansetron. 6.  RTC in 3 weeks for MD assessment, labs (CBC with diff, CMP, Mg), and Alimta.   Lequita Asal, MD  08/22/2016, 11:22 AM

## 2016-08-23 LAB — CEA: CEA: 5 ng/mL — ABNORMAL HIGH (ref 0.0–4.7)

## 2016-08-26 ENCOUNTER — Other Ambulatory Visit: Payer: Self-pay | Admitting: *Deleted

## 2016-08-26 DIAGNOSIS — C3492 Malignant neoplasm of unspecified part of left bronchus or lung: Secondary | ICD-10-CM

## 2016-08-26 DIAGNOSIS — C3490 Malignant neoplasm of unspecified part of unspecified bronchus or lung: Secondary | ICD-10-CM

## 2016-08-26 DIAGNOSIS — C770 Secondary and unspecified malignant neoplasm of lymph nodes of head, face and neck: Secondary | ICD-10-CM

## 2016-08-26 MED ORDER — MORPHINE SULFATE ER 15 MG PO TBCR: 15.0000 mg | EXTENDED_RELEASE_TABLET | Freq: Two times a day (BID) | ORAL | 0 refills | Status: DC

## 2016-09-03 ENCOUNTER — Ambulatory Visit: Admission: RE | Admit: 2016-09-03 | Payer: Medicaid Other | Source: Ambulatory Visit

## 2016-09-12 ENCOUNTER — Inpatient Hospital Stay: Payer: Medicaid Other

## 2016-09-12 ENCOUNTER — Inpatient Hospital Stay: Payer: Medicaid Other | Admitting: Hematology and Oncology

## 2016-09-12 ENCOUNTER — Other Ambulatory Visit: Payer: Self-pay | Admitting: Hematology and Oncology

## 2016-09-12 DIAGNOSIS — C3490 Malignant neoplasm of unspecified part of unspecified bronchus or lung: Secondary | ICD-10-CM

## 2016-09-12 NOTE — Progress Notes (Deleted)
[facility] day:  09/12/2016   Chief Complaint: Audrey Mcgee is a 55 y.o. female with metastatic poorly differentiated non-small cell lung cancer who is seen for assessment prior to cycle #7 Alimta.  HPI: The patient was last seen in the medical oncology clinic by me on 08/01/2016.  At that time, she felt good.  Weight had improved on Megace.  She had hypomagnesemia (1.5).  Oral magnesium was increased to TID.  She received cycle #6 Alimta.  Evaluation by ENT noted left vocal cord paralysis. Neck CT on 09/03/2016 revealed.  She may have surgery.    Past Medical History:  Diagnosis Date  . COPD (chronic obstructive pulmonary disease) ([place])   . Hypertension   . Lung cancer [facility])     Past Surgical History:  Procedure Laterality Date  . CESAREAN SECTION CLASSICAL    . STOMACH SURGERY     Pt reports for a tumor    Family History  Problem Relation Age of Onset  . Hypertension Father   . Cancer Father   . Diabetes Father   . Cancer Maternal Aunt     Social History:  reports that she has quit smoking. Her smoking use included Cigarettes. She quit after 15.00 years of use. She has never used smokeless tobacco. She reports that she does not drink alcohol or use drugs.  She stopped smoking.  Her nephew shot in [place] (caused delays in her treatment).  Her boyfriend's father  died of metastatic lung cancer.  Her boyfriend works at a school with children.  The patient's 1st cousin died (age 13) of a cerebral aneurysm.  The patient is accompanied by her boyfriend, [name], today.    Allergies:  Allergies  Allergen Reactions  . No Known Allergies     Current Medications: Current Outpatient Prescriptions  Medication Sig Dispense Refill  . benzonatate (TESSALON) 100 MG capsule Take 1 capsule (100 mg total) by mouth 3 (three) times daily as needed for cough. 20 capsule 0  . dexamethasone (DECADRON) 4 MG tablet Take 71m BID starting  day before treatment for a total of 3 days. (Patient taking differently: Take 4 mg by mouth 2 (two) times daily. Starting the day before treatment for 3 days) 20 tablet 0  . docusate sodium (COLACE) 100 MG capsule Take 100 mg by mouth 2 (two) times daily as needed for mild constipation.    . folic acid (FOLVITE) 8782MCG tablet Take 800 mcg by mouth daily.     .Marland Kitchenlidocaine (XYLOCAINE) 2 % solution Use as directed 10 mLs in the mouth or throat every 4 (four) hours as needed for mouth pain.    .Marland Kitchenlosartan (COZAAR) 100 MG tablet Take 1 tablet (100 mg total) by mouth daily. 30 tablet 0  . magic mouthwash w/lidocaine SOLN Take 5 mLs by mouth 4 (four) times daily as needed for mouth pain. (Patient not taking: Reported on 08/22/2016) 480 mL 1  . magnesium oxide (MAG-OX) 400 MG tablet Take 1 tablet (400 mg total) by mouth daily. 30 tablet 0  . megestrol (MEGACE) 40 MG/ML suspension Take 5 mLs (200 mg total) by mouth 2 (two) times daily. 300 mL 1  . methimazole (TAPAZOLE) 5 MG tablet take 1 tablet by mouth twice a day for OVERACTIVE THYROID  0  . metoprolol (LOPRESSOR) 50 MG tablet take 1 tablet by mouth twice a day NOTE DOSE INCREASE  0  . morphine (MS CONTIN) 15 MG 12  hr tablet Take 1 tablet (15 mg total) by mouth 2 (two) times daily. 60 tablet 0  . omeprazole (PRILOSEC) 20 MG capsule Take 1 capsule (20 mg total) by mouth daily. 30 capsule 3  . ondansetron (ZOFRAN) 8 MG tablet Take 1 tablet (8 mg total) by mouth 2 (two) times daily as needed for nausea or vomiting. 30 tablet 1  . Oxycodone HCl 10 MG TABS Take 1 tablet (10 mg total) by mouth every 4 (four) hours as needed (pain). 60 tablet 0  . predniSONE (STERAPRED UNI-PAK 21 TAB) 10 MG (21) TBPK tablet Take 1 tablet (10 mg total) by mouth daily. 6 tab lets on day1 5 tab on day 2 4 tab daily for day 3 3 tab daily for 4 2 tab daily for 5 1 tab daily for 6 (Patient not taking: Reported on 08/22/2016) 21 tablet 0  . tiotropium (SPIRIVA) 18 MCG inhalation capsule  Place 1 capsule (18 mcg total) into inhaler and inhale daily. 30 capsule 1   No current facility-administered medications for this visit.     Review of Systems:  GENERAL: Feels "good". No fevers or chills. Weight up 6 pounds. PERFORMANCE STATUS (ECOG): 1 HEENT: Chronic hoarseness (saw ENT)  No sore throat, mouth sores or tenderness. Lungs:  Shortness of breath with exertion.  Clear cough.  No hemoptysis. Cardiac: No chest pain, palpitations, orthopnea, or PND. GI: Nausea post chemotherapy.  No vomiting, diarrhea, constipation, melena or hematochezia. GU: No urgency, frequency, dysuria, or hematuria. Musculoskeletal: Back pain under right shoulder. No joint pain. No muscle tenderness. Extremities: No pain or swelling. Skin: No rashes or skin changes. Neuro: Headache (rare).  No numbness or weakness, balance or coordination issues. Endocrine: No diabetes.  Thyroid issues.  No hot flashes or night sweats. Psych: No mood changes, depression or anxiety. Pain: Pain in shoulder (stable). Review of systems: All other systems reviewed and found to be negative.  Physical Exam: There were no vitals taken for this visit.  GENERAL: Thin woman sitting comfortably in the exam room in no acute distress. MENTAL STATUS: Alert and oriented to person, place and time. HEAD: Dark curly hair. Normocephalic, atraumatic, no Cushingoid features.  EYES: Brown eyes. Ruddy sclera.  Pupils equal round and reactive to light and accomodation. No conjunctivitis or scleral icterus. ENT: Hoarse.  Poor dentition.  Oropharynx clear without lesion. Tongue normal. Mucous membranes moist.  RESPIRATORY: Clear to auscultation without rales, wheezes or rhonchi. CARDIOVASCULAR: Regular rate and rhythm without murmur, rub or gallop. ABDOMEN: Soft, non-tender, with active bowel sounds, and no hepatosplenomegaly. No masses. SKIN: No rashes, ulcers or lesions. EXTREMITIES: No edema, skin  discoloration or tenderness. No palpable cords. LYMPH NODES: No palpable cervical, supraclavicular, axillary or inguinal adenopathy  NEUROLOGICAL: Unremarkable. PSYCH: Appropriate.   No visits with results within 3 Day(s) from this visit.  Latest known visit with results is:  Appointment on 08/22/2016  Component Date Value Ref Range Status  . Magnesium 08/22/2016 1.5* 1.7 - 2.4 mg/dL Final  . CEA 08/23/2016 5.0* 0.0 - 4.7 ng/mL Final   Comment: (NOTE)       Roche ECLIA methodology       Nonsmokers  <3.9                                     Smokers     <5.6 Performed At: [facility] [address] [place], [place] [id number] [place]  Darrick Penna MD [id number]   . Sodium 08/22/2016 138  135 - 145 mmol/L Final  . Potassium 08/22/2016 3.9  3.5 - 5.1 mmol/L Final  . Chloride 08/22/2016 104  101 - 111 mmol/L Final  . CO2 08/22/2016 25  22 - 32 mmol/L Final  . Glucose, Bld 08/22/2016 134* 65 - 99 mg/dL Final  . BUN 08/22/2016 6  6 - 20 mg/dL Final  . Creatinine, Ser 08/22/2016 0.51  0.44 - 1.00 mg/dL Final  . Calcium 08/22/2016 9.4  8.9 - 10.3 mg/dL Final  . Total Protein 08/22/2016 7.4  6.5 - 8.1 g/dL Final  . Albumin 08/22/2016 3.9  3.5 - 5.0 g/dL Final  . AST 08/22/2016 23  15 - 41 U/L Final  . ALT 08/22/2016 14  14 - 54 U/L Final  . Alkaline Phosphatase 08/22/2016 54  38 - 126 U/L Final  . Total Bilirubin 08/22/2016 0.5  0.3 - 1.2 mg/dL Final  . GFR calc non Af Amer 08/22/2016 >60  >60 mL/min Final  . GFR calc Af Amer 08/22/2016 >60  >60 mL/min Final   Comment: (NOTE) The eGFR has been calculated using the CKD EPI equation. This calculation has not been validated in all clinical situations. eGFR's persistently <60 mL/min signify possible Chronic Kidney Disease.   . Anion gap 08/22/2016 9  5 - 15 Final  . WBC 08/22/2016 7.4  3.6 - 11.0 K/uL Final  . RBC 08/22/2016 4.35  3.80 - 5.20 MIL/uL Final  . Hemoglobin 08/22/2016 14.1  12.0 - 16.0 g/dL Final  . HCT 08/22/2016  40.8  35.0 - 47.0 % Final  . MCV 08/22/2016 93.8  80.0 - 100.0 fL Final  . MCH 08/22/2016 32.5  26.0 - 34.0 pg Final  . MCHC 08/22/2016 34.6  32.0 - 36.0 g/dL Final  . RDW 08/22/2016 14.6* 11.5 - 14.5 % Final  . Platelets 08/22/2016 304  150 - 440 K/uL Final  . Neutrophils Relative % 08/22/2016 78  % Final  . Neutro Abs 08/22/2016 5.8  1.4 - 6.5 K/uL Final  . Lymphocytes Relative 08/22/2016 12  % Final  . Lymphs Abs 08/22/2016 0.9* 1.0 - 3.6 K/uL Final  . Monocytes Relative 08/22/2016 9  % Final  . Monocytes Absolute 08/22/2016 0.6  0.2 - 0.9 K/uL Final  . Eosinophils Relative 08/22/2016 0  % Final  . Eosinophils Absolute 08/22/2016 0.0  0 - 0.7 K/uL Final  . Basophils Relative 08/22/2016 1  % Final  . Basophils Absolute 08/22/2016 0.0  0 - 0.1 K/uL Final    Assessment:  Audrey Mcgee is a 55 y.o. female with metastatic poorly differentiated adenocarcinoma of lung. She presented with a 5 month history of enlarging bilateral neck masses, progressive hoarseness, dysphagia, dyspnea on exertion, cough, left sided hearing loss, neck pain, and a 30 pound weight loss.  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 Biopsy of the right supraclavicular lymph node on 10/03/2014 revealed poorly differentiated non-small cell carcinoma, favoring adenocarcinoma consistent with lung primary (TTF-1 positive). KRAS was positive.   PET scan on 10/24/2014 revealed a left hilar mass with associated left upper lobe obstruction/atelectasis with extensive bilateral mediastinal and cervical adenopathy. Chest CT from 10/10/2014 also noted a 2.2 x 0.9 cm soft tissue density with internal calcifications in the right breast. Head MRI revealed no evidence of metastatic disease.  She received palliative radiation (3000 cGy) to the left lung from 10/24/2014 - 11/07/2014. She has received 3 cycles of  carboplatin and Alimta (11/14/2014, 12/05/2014, and 02/13/2015).    She  has pain in her left chest, throat, and shoulder. She is on oxycodone 10 mg every 6 hours as needed. Morphine causes nausea. She has chronic shortness of breath with exertion.  She received 6 cycles carboplatin and Alimta (11/14/2014 - 04/30/2015). There was a delay between cycle #2 and cycle #3 secondary to a switch in caregivers.  She was diagnosed with a left peri-hilar infiltrate on 02/23/2015. She has completed a course of Levaquin. Chest CT on 03/21/2015 revealed LUL scarring, patchy ill defined airspace opacities in the lingula, upper esophageal thickening, and small mediastinal and hilar nodes. She completed a course of Azithromycin.  She received 2 cycles of maintenance Alimta (05/22/2015 - 06/15/2015). Chest CT on 07/09/2015 revealed patchy ground glass and nodular consolidation in the left upper lobe and to a lesser extent left lower lobe, minimally progressive from 03/21/2015.  Bone scan on 07/09/2015 revealed no evidence of metastatic disease.  CXR on 09/24/2015 revealed patchy left perihilar airspace opacities.  She was treated with azithromycin.  Chest CT on 10/17/2015 revealed patchy ground-glass and ill-defined nodular areas of opacity in the left apex, lingula, and anterior left lower lobe generally appeared decreased in the interval although 1 of the nodular areas in the left apex has progressed slightly since the previous study.  Overall imaging features are felt to be most likely treatment related.  There had been an interval slight decrease in proximal to mid esophageal circumferential wall thickening, potentially treatment related.   She was lost to follow-up for approximately 3 months.  Chest and abdomen CT scan on 02/04/2016 revealed evolving radiation changes in the left upper and left lower lobes. Previously measured left upper lobe nodule was no longer visualized. There was no evidence of metastatic disease.  Right hilar lymph nodes were not considered enlarged by CT  size criteria and are nonspecific, but appeared new from the prior exam.  Bone scan on 02/19/2016 revealed no definite evidence of metastatic disease. There was subtle uptake within the posterior aspects of the upper ribs bilaterally (nonspecific and not entirely new).  There was a tiny focus of increased uptake near the vertex of the calvarium (new).  She has been hoarse since 02/01/2016.  She has received 6 cycles of maintenance Alimta (restarted 02/29/2016 - 06/06/2016; 08/01/2016 - 08/22/2016).  She receives B12 every 9 weeks (last 08/01/2016) and is on daily folic acid.   She has ongoing hoarseness and difficulty swallowing.  She has not had her ENT or swallowing evaluations.  She has had delays in therapy secondary to tooth abscesses.  She has mild visual changes and intermittent headaches.  She was admitted to [facility] from 06/21/2016 - 06/22/2016 with severe shortness of breath secondary to possible aspiration.  Chest CT angiogram on 06/21/2016 revealed no evidence of pulmonary embolism . There was stable patchy peribronchovascular interstitial thickening, volume loss and patchy reticulation in the left upper lobe and central left lower lobe, presumably representing stable posttreatment change.  Bilateral lower extremity duplex revealed no evidence of thrombosis.  She was discharged on prednisone, Spiriva, and Levaquin.   Chest VT angiogram on 07/08/2016 revealed no evidence of pulmonary embolism.  There was stable patchy peribronchovascular and interstitial thickening with associated bronchiectasis.  She has hyperthyroidism.  She is on methimazole.  TSH was 0.037 (low) and free T4 2.27 (high) on 08/01/2016.    Symptomatically, she feels good.  Weight has improved on Megace.  She has hypomagnesemia (1.5).  Plan: 1.  Labs today:  CBC with diff, CMP, Mg. 2.  Cycle #7 Alimta today. 3.  Next B12 due 09/03/2016.  3.  Follow-up with ENT. 4.  Increase magnesium oxide to TID. 5.  Rx:   ondansetron. 6.  RTC in 3 weeks for MD assessment, labs (CBC with diff, CMP, Mg), and Alimta.   Lequita Asal, MD  09/12/2016, 3:39 AM

## 2016-09-22 ENCOUNTER — Other Ambulatory Visit: Payer: Self-pay | Admitting: *Deleted

## 2016-09-22 DIAGNOSIS — C3490 Malignant neoplasm of unspecified part of unspecified bronchus or lung: Secondary | ICD-10-CM

## 2016-09-22 MED ORDER — MEGESTROL ACETATE 40 MG/ML PO SUSP: 200.0000 mg | Freq: Two times a day (BID) | ORAL | 1 refills | Status: DC

## 2016-09-22 MED ORDER — OXYCODONE HCL 10 MG PO TABS: 10.0000 mg | ORAL_TABLET | ORAL | 0 refills | Status: DC | PRN

## 2016-09-26 ENCOUNTER — Inpatient Hospital Stay: Payer: Medicaid Other

## 2016-09-26 ENCOUNTER — Inpatient Hospital Stay: Payer: Medicaid Other | Admitting: Hematology and Oncology

## 2016-09-26 ENCOUNTER — Other Ambulatory Visit: Payer: Self-pay | Admitting: Hematology and Oncology

## 2016-09-26 NOTE — Progress Notes (Deleted)
[facility] day:  09/26/2016   Chief Complaint: Audrey Mcgee is a 55 y.o. female with metastatic poorly differentiated non-small cell lung cancer who is seen for assessment prior to cycle #7 Alimta.  HPI: The patient was last seen in the medical oncology clinic by me on 08/22/2016.  At that time, she felt good.  Weight had improved on Megace.  She had hypomagnesemia (1.5).  Oral magnesium was increased to TID.  She received cycle #6 Alimta.  Evaluation by ENT noted left vocal cord paralysis. Neck CT on 09/03/2016 revealed.  She may have surgery.    Past Medical History:  Diagnosis Date  . COPD (chronic obstructive pulmonary disease) ([place])   . Hypertension   . Lung cancer [facility])     Past Surgical History:  Procedure Laterality Date  . CESAREAN SECTION CLASSICAL    . STOMACH SURGERY     Pt reports for a tumor    Family History  Problem Relation Age of Onset  . Hypertension Father   . Cancer Father   . Diabetes Father   . Cancer Maternal Aunt     Social History:  reports that she has quit smoking. Her smoking use included Cigarettes. She quit after 15.00 years of use. She has never used smokeless tobacco. She reports that she does not drink alcohol or use drugs.  She stopped smoking.  Her nephew shot in [place] (caused delays in her treatment).  Her boyfriend's father  died of metastatic lung cancer.  Her boyfriend works at a school with children.  The patient's 1st cousin died (age 94) of a cerebral aneurysm.  The patient is accompanied by her boyfriend, [name], today.    Allergies:  Allergies  Allergen Reactions  . No Known Allergies     Current Medications: Current Outpatient Prescriptions  Medication Sig Dispense Refill  . benzonatate (TESSALON) 100 MG capsule Take 1 capsule (100 mg total) by mouth 3 (three) times daily as needed for cough. 20 capsule 0  . dexamethasone (DECADRON) 4 MG tablet Take 53m BID starting  day before treatment for a total of 3 days. (Patient taking differently: Take 4 mg by mouth 2 (two) times daily. Starting the day before treatment for 3 days) 20 tablet 0  . docusate sodium (COLACE) 100 MG capsule Take 100 mg by mouth 2 (two) times daily as needed for mild constipation.    . folic acid (FOLVITE) 8588MCG tablet Take 800 mcg by mouth daily.     .Marland Kitchenlidocaine (XYLOCAINE) 2 % solution Use as directed 10 mLs in the mouth or throat every 4 (four) hours as needed for mouth pain.    .Marland Kitchenlosartan (COZAAR) 100 MG tablet Take 1 tablet (100 mg total) by mouth daily. 30 tablet 0  . magic mouthwash w/lidocaine SOLN Take 5 mLs by mouth 4 (four) times daily as needed for mouth pain. (Patient not taking: Reported on 08/22/2016) 480 mL 1  . magnesium oxide (MAG-OX) 400 MG tablet Take 1 tablet (400 mg total) by mouth daily. 30 tablet 0  . megestrol (MEGACE) 40 MG/ML suspension Take 5 mLs (200 mg total) by mouth 2 (two) times daily. 300 mL 1  . methimazole (TAPAZOLE) 5 MG tablet take 1 tablet by mouth twice a day for OVERACTIVE THYROID  0  . metoprolol (LOPRESSOR) 50 MG tablet take 1 tablet by mouth twice a day NOTE DOSE INCREASE  0  . morphine (MS CONTIN) 15 MG 12  hr tablet Take 1 tablet (15 mg total) by mouth 2 (two) times daily. 60 tablet 0  . omeprazole (PRILOSEC) 20 MG capsule Take 1 capsule (20 mg total) by mouth daily. 30 capsule 3  . ondansetron (ZOFRAN) 8 MG tablet Take 1 tablet (8 mg total) by mouth 2 (two) times daily as needed for nausea or vomiting. 30 tablet 1  . Oxycodone HCl 10 MG TABS Take 1 tablet (10 mg total) by mouth every 4 (four) hours as needed (pain). 60 tablet 0  . predniSONE (STERAPRED UNI-PAK 21 TAB) 10 MG (21) TBPK tablet Take 1 tablet (10 mg total) by mouth daily. 6 tab lets on day1 5 tab on day 2 4 tab daily for day 3 3 tab daily for 4 2 tab daily for 5 1 tab daily for 6 (Patient not taking: Reported on 08/22/2016) 21 tablet 0  . tiotropium (SPIRIVA) 18 MCG inhalation capsule  Place 1 capsule (18 mcg total) into inhaler and inhale daily. 30 capsule 1   No current facility-administered medications for this visit.     Review of Systems:  GENERAL: Feels "good". No fevers or chills. Weight up 6 pounds. PERFORMANCE STATUS (ECOG): 1 HEENT: Chronic hoarseness (saw ENT)  No sore throat, mouth sores or tenderness. Lungs:  Shortness of breath with exertion.  Clear cough.  No hemoptysis. Cardiac: No chest pain, palpitations, orthopnea, or PND. GI: Nausea post chemotherapy.  No vomiting, diarrhea, constipation, melena or hematochezia. GU: No urgency, frequency, dysuria, or hematuria. Musculoskeletal: Back pain under right shoulder. No joint pain. No muscle tenderness. Extremities: No pain or swelling. Skin: No rashes or skin changes. Neuro: Headache (rare).  No numbness or weakness, balance or coordination issues. Endocrine: No diabetes.  Thyroid issues.  No hot flashes or night sweats. Psych: No mood changes, depression or anxiety. Pain: Pain in shoulder (stable). Review of systems: All other systems reviewed and found to be negative.  Physical Exam: There were no vitals taken for this visit.  GENERAL: Thin woman sitting comfortably in the exam room in no acute distress. MENTAL STATUS: Alert and oriented to person, place and time. HEAD: Dark curly hair. Normocephalic, atraumatic, no Cushingoid features.  EYES: Brown eyes. Ruddy sclera.  Pupils equal round and reactive to light and accomodation. No conjunctivitis or scleral icterus. ENT: Hoarse.  Poor dentition.  Oropharynx clear without lesion. Tongue normal. Mucous membranes moist.  RESPIRATORY: Clear to auscultation without rales, wheezes or rhonchi. CARDIOVASCULAR: Regular rate and rhythm without murmur, rub or gallop. ABDOMEN: Soft, non-tender, with active bowel sounds, and no hepatosplenomegaly. No masses. SKIN: No rashes, ulcers or lesions. EXTREMITIES: No edema, skin  discoloration or tenderness. No palpable cords. LYMPH NODES: No palpable cervical, supraclavicular, axillary or inguinal adenopathy  NEUROLOGICAL: Unremarkable. PSYCH: Appropriate.   No visits with results within 3 Day(s) from this visit.  Latest known visit with results is:  Appointment on 08/22/2016  Component Date Value Ref Range Status  . Magnesium 08/22/2016 1.5* 1.7 - 2.4 mg/dL Final  . CEA 08/23/2016 5.0* 0.0 - 4.7 ng/mL Final   Comment: (NOTE)       Roche ECLIA methodology       Nonsmokers  <3.9                                     Smokers     <5.6 Performed At: [facility] [address] [place], [place] [id number] [place]  Darrick Penna MD [id number]   . Sodium 08/22/2016 138  135 - 145 mmol/L Final  . Potassium 08/22/2016 3.9  3.5 - 5.1 mmol/L Final  . Chloride 08/22/2016 104  101 - 111 mmol/L Final  . CO2 08/22/2016 25  22 - 32 mmol/L Final  . Glucose, Bld 08/22/2016 134* 65 - 99 mg/dL Final  . BUN 08/22/2016 6  6 - 20 mg/dL Final  . Creatinine, Ser 08/22/2016 0.51  0.44 - 1.00 mg/dL Final  . Calcium 08/22/2016 9.4  8.9 - 10.3 mg/dL Final  . Total Protein 08/22/2016 7.4  6.5 - 8.1 g/dL Final  . Albumin 08/22/2016 3.9  3.5 - 5.0 g/dL Final  . AST 08/22/2016 23  15 - 41 U/L Final  . ALT 08/22/2016 14  14 - 54 U/L Final  . Alkaline Phosphatase 08/22/2016 54  38 - 126 U/L Final  . Total Bilirubin 08/22/2016 0.5  0.3 - 1.2 mg/dL Final  . GFR calc non Af Amer 08/22/2016 >60  >60 mL/min Final  . GFR calc Af Amer 08/22/2016 >60  >60 mL/min Final   Comment: (NOTE) The eGFR has been calculated using the CKD EPI equation. This calculation has not been validated in all clinical situations. eGFR's persistently <60 mL/min signify possible Chronic Kidney Disease.   . Anion gap 08/22/2016 9  5 - 15 Final  . WBC 08/22/2016 7.4  3.6 - 11.0 K/uL Final  . RBC 08/22/2016 4.35  3.80 - 5.20 MIL/uL Final  . Hemoglobin 08/22/2016 14.1  12.0 - 16.0 g/dL Final  . HCT 08/22/2016  40.8  35.0 - 47.0 % Final  . MCV 08/22/2016 93.8  80.0 - 100.0 fL Final  . MCH 08/22/2016 32.5  26.0 - 34.0 pg Final  . MCHC 08/22/2016 34.6  32.0 - 36.0 g/dL Final  . RDW 08/22/2016 14.6* 11.5 - 14.5 % Final  . Platelets 08/22/2016 304  150 - 440 K/uL Final  . Neutrophils Relative % 08/22/2016 78  % Final  . Neutro Abs 08/22/2016 5.8  1.4 - 6.5 K/uL Final  . Lymphocytes Relative 08/22/2016 12  % Final  . Lymphs Abs 08/22/2016 0.9* 1.0 - 3.6 K/uL Final  . Monocytes Relative 08/22/2016 9  % Final  . Monocytes Absolute 08/22/2016 0.6  0.2 - 0.9 K/uL Final  . Eosinophils Relative 08/22/2016 0  % Final  . Eosinophils Absolute 08/22/2016 0.0  0 - 0.7 K/uL Final  . Basophils Relative 08/22/2016 1  % Final  . Basophils Absolute 08/22/2016 0.0  0 - 0.1 K/uL Final    Assessment:  Devi Hopman is a 55 y.o. female with metastatic poorly differentiated adenocarcinoma of lung. She presented with a 5 month history of enlarging bilateral neck masses, progressive hoarseness, dysphagia, dyspnea on exertion, cough, left sided hearing loss, neck pain, and a 30 pound weight loss.  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 Biopsy of the right supraclavicular lymph node on 10/03/2014 revealed poorly differentiated non-small cell carcinoma, favoring adenocarcinoma consistent with lung primary (TTF-1 positive). KRAS was positive.   PET scan on 10/24/2014 revealed a left hilar mass with associated left upper lobe obstruction/atelectasis with extensive bilateral mediastinal and cervical adenopathy. Chest CT from 10/10/2014 also noted a 2.2 x 0.9 cm soft tissue density with internal calcifications in the right breast. Head MRI revealed no evidence of metastatic disease.  She received palliative radiation (3000 cGy) to the left lung from 10/24/2014 - 11/07/2014. She has received 3 cycles of  carboplatin and Alimta (11/14/2014, 12/05/2014, and 02/13/2015).    She  has pain in her left chest, throat, and shoulder. She is on oxycodone 10 mg every 6 hours as needed. Morphine causes nausea. She has chronic shortness of breath with exertion.  She received 6 cycles carboplatin and Alimta (11/14/2014 - 04/30/2015). There was a delay between cycle #2 and cycle #3 secondary to a switch in caregivers.  She was diagnosed with a left peri-hilar infiltrate on 02/23/2015. She has completed a course of Levaquin. Chest CT on 03/21/2015 revealed LUL scarring, patchy ill defined airspace opacities in the lingula, upper esophageal thickening, and small mediastinal and hilar nodes. She completed a course of Azithromycin.  She received 2 cycles of maintenance Alimta (05/22/2015 - 06/15/2015). Chest CT on 07/09/2015 revealed patchy ground glass and nodular consolidation in the left upper lobe and to a lesser extent left lower lobe, minimally progressive from 03/21/2015.  Bone scan on 07/09/2015 revealed no evidence of metastatic disease.  CXR on 09/24/2015 revealed patchy left perihilar airspace opacities.  She was treated with azithromycin.  Chest CT on 10/17/2015 revealed patchy ground-glass and ill-defined nodular areas of opacity in the left apex, lingula, and anterior left lower lobe generally appeared decreased in the interval although 1 of the nodular areas in the left apex has progressed slightly since the previous study.  Overall imaging features are felt to be most likely treatment related.  There had been an interval slight decrease in proximal to mid esophageal circumferential wall thickening, potentially treatment related.   She was lost to follow-up for approximately 3 months.  Chest and abdomen CT scan on 02/04/2016 revealed evolving radiation changes in the left upper and left lower lobes. Previously measured left upper lobe nodule was no longer visualized. There was no evidence of metastatic disease.  Right hilar lymph nodes were not considered enlarged by CT  size criteria and are nonspecific, but appeared new from the prior exam.  Bone scan on 02/19/2016 revealed no definite evidence of metastatic disease. There was subtle uptake within the posterior aspects of the upper ribs bilaterally (nonspecific and not entirely new).  There was a tiny focus of increased uptake near the vertex of the calvarium (new).  She has been hoarse since 02/01/2016.  She has received 6 cycles of maintenance Alimta (restarted 02/29/2016 - 06/06/2016; 08/01/2016 - 08/22/2016).  She receives B12 every 9 weeks (last 08/01/2016) and is on daily folic acid.   She has ongoing hoarseness and difficulty swallowing.  She has not had her ENT or swallowing evaluations.  She has had delays in therapy secondary to tooth abscesses.  She has mild visual changes and intermittent headaches.  She was admitted to [facility] from 06/21/2016 - 06/22/2016 with severe shortness of breath secondary to possible aspiration.  Chest CT angiogram on 06/21/2016 revealed no evidence of pulmonary embolism . There was stable patchy peribronchovascular interstitial thickening, volume loss and patchy reticulation in the left upper lobe and central left lower lobe, presumably representing stable posttreatment change.  Bilateral lower extremity duplex revealed no evidence of thrombosis.  She was discharged on prednisone, Spiriva, and Levaquin.   Chest VT angiogram on 07/08/2016 revealed no evidence of pulmonary embolism.  There was stable patchy peribronchovascular and interstitial thickening with associated bronchiectasis.  She has hyperthyroidism.  She is on methimazole.  TSH was 0.037 (low) and free T4 2.27 (high) on 08/01/2016.    Symptomatically, she feels good.  Weight has improved on Megace.  She has hypomagnesemia (1.5).  Plan: 1.  Labs today:  CBC with diff, CMP, Mg. 2.  Cycle #7 Alimta today. 3.  B12 today.  3.  Follow-up with ENT. 4.  Increase magnesium oxide to TID. 5.  Rx:  ondansetron. 6.  RTC in 3  weeks for MD assessment, labs (CBC with diff, CMP, Mg), and Alimta.   Lequita Asal, MD  09/26/2016, 4:35 AM

## 2016-10-05 NOTE — Progress Notes (Deleted)
[facility] day:  10/05/2016   Chief Complaint: Audrey Mcgee is a 55 y.o. female with metastatic poorly differentiated non-small cell lung cancer who is seen for assessment prior to cycle #7 Alimta.  HPI: The patient was last seen in the medical oncology clinic by me on 08/22/2016.  At that time, she felt good.  Weight had improved on Megace.  She had hypomagnesemia (1.5).  Oral magnesium was increased to TID.  She received cycle #6 Alimta.  Evaluation by ENT noted left vocal cord paralysis.  Neck CT on 09/03/2016 revealed.  She may have surgery.    Past Medical History:  Diagnosis Date  . COPD (chronic obstructive pulmonary disease) ([place])   . Hypertension   . Lung cancer [facility])     Past Surgical History:  Procedure Laterality Date  . CESAREAN SECTION CLASSICAL    . STOMACH SURGERY     Pt reports for a tumor    Family History  Problem Relation Age of Onset  . Hypertension Father   . Cancer Father   . Diabetes Father   . Cancer Maternal Aunt     Social History:  reports that she has quit smoking. Her smoking use included Cigarettes. She quit after 15.00 years of use. She has never used smokeless tobacco. She reports that she does not drink alcohol or use drugs.  She stopped smoking.  Her nephew shot in [place] (caused delays in her treatment).  Her boyfriend's father  died of metastatic lung cancer.  Her boyfriend works at a school with children.  The patient's 1st cousin died (age 47) of a cerebral aneurysm.  The patient is accompanied by her boyfriend, Audrey Mcgee, today.    Allergies:  Allergies  Allergen Reactions  . No Known Allergies     Current Medications: Current Outpatient Prescriptions  Medication Sig Dispense Refill  . benzonatate (TESSALON) 100 MG capsule Take 1 capsule (100 mg total) by mouth 3 (three) times daily as needed for cough. 20 capsule 0  . dexamethasone (DECADRON) 4 MG tablet Take 2m BID  starting day before treatment for a total of 3 days. (Patient taking differently: Take 4 mg by mouth 2 (two) times daily. Starting the day before treatment for 3 days) 20 tablet 0  . docusate sodium (COLACE) 100 MG capsule Take 100 mg by mouth 2 (two) times daily as needed for mild constipation.    . folic acid (FOLVITE) 8024MCG tablet Take 800 mcg by mouth daily.     .Marland Kitchenlidocaine (XYLOCAINE) 2 % solution Use as directed 10 mLs in the mouth or throat every 4 (four) hours as needed for mouth pain.    .Marland Kitchenlosartan (COZAAR) 100 MG tablet Take 1 tablet (100 mg total) by mouth daily. 30 tablet 0  . magic mouthwash w/lidocaine SOLN Take 5 mLs by mouth 4 (four) times daily as needed for mouth pain. (Patient not taking: Reported on 08/22/2016) 480 mL 1  . magnesium oxide (MAG-OX) 400 MG tablet Take 1 tablet (400 mg total) by mouth daily. 30 tablet 0  . megestrol (MEGACE) 40 MG/ML suspension Take 5 mLs (200 mg total) by mouth 2 (two) times daily. 300 mL 1  . methimazole (TAPAZOLE) 5 MG tablet take 1 tablet by mouth twice a day for OVERACTIVE THYROID  0  . metoprolol (LOPRESSOR) 50 MG tablet take 1 tablet by mouth twice a day NOTE DOSE INCREASE  0  . morphine (MS CONTIN) 15 MG  12 hr tablet Take 1 tablet (15 mg total) by mouth 2 (two) times daily. 60 tablet 0  . omeprazole (PRILOSEC) 20 MG capsule Take 1 capsule (20 mg total) by mouth daily. 30 capsule 3  . ondansetron (ZOFRAN) 8 MG tablet Take 1 tablet (8 mg total) by mouth 2 (two) times daily as needed for nausea or vomiting. 30 tablet 1  . Oxycodone HCl 10 MG TABS Take 1 tablet (10 mg total) by mouth every 4 (four) hours as needed (pain). 60 tablet 0  . predniSONE (STERAPRED UNI-PAK 21 TAB) 10 MG (21) TBPK tablet Take 1 tablet (10 mg total) by mouth daily. 6 tab lets on day1 5 tab on day 2 4 tab daily for day 3 3 tab daily for 4 2 tab daily for 5 1 tab daily for 6 (Patient not taking: Reported on 08/22/2016) 21 tablet 0  . tiotropium (SPIRIVA) 18 MCG inhalation  capsule Place 1 capsule (18 mcg total) into inhaler and inhale daily. 30 capsule 1   No current facility-administered medications for this visit.     Review of Systems:  GENERAL: Feels "good". No fevers or chills. Weight up 6 pounds. PERFORMANCE STATUS (ECOG): 1 HEENT: Chronic hoarseness (saw ENT)  No sore throat, mouth sores or tenderness. Lungs:  Shortness of breath with exertion.  Clear cough.  No hemoptysis. Cardiac: No chest pain, palpitations, orthopnea, or PND. GI: Nausea post chemotherapy.  No vomiting, diarrhea, constipation, melena or hematochezia. GU: No urgency, frequency, dysuria, or hematuria. Musculoskeletal: Back pain under right shoulder. No joint pain. No muscle tenderness. Extremities: No pain or swelling. Skin: No rashes or skin changes. Neuro: Headache (rare).  No numbness or weakness, balance or coordination issues. Endocrine: No diabetes.  Thyroid issues.  No hot flashes or night sweats. Psych: No mood changes, depression or anxiety. Pain: Pain in shoulder (stable). Review of systems: All other systems reviewed and found to be negative.  Physical Exam: There were no vitals taken for this visit.  GENERAL: Thin woman sitting comfortably in the exam room in no acute distress. MENTAL STATUS: Alert and oriented to person, place and time. HEAD: Dark curly hair. Normocephalic, atraumatic, no Cushingoid features.  EYES: Brown eyes. Ruddy sclera.  Pupils equal round and reactive to light and accomodation. No conjunctivitis or scleral icterus. ENT: Hoarse.  Poor dentition.  Oropharynx clear without lesion. Tongue normal. Mucous membranes moist.  RESPIRATORY: Clear to auscultation without rales, wheezes or rhonchi. CARDIOVASCULAR: Regular rate and rhythm without murmur, rub or gallop. ABDOMEN: Soft, non-tender, with active bowel sounds, and no hepatosplenomegaly. No masses. SKIN: No rashes, ulcers or lesions. EXTREMITIES: No edema, skin  discoloration or tenderness. No palpable cords. LYMPH NODES: No palpable cervical, supraclavicular, axillary or inguinal adenopathy  NEUROLOGICAL: Unremarkable. PSYCH: Appropriate.   No visits with results within 3 Day(s) from this visit.  Latest known visit with results is:  Appointment on 08/22/2016  Component Date Value Ref Range Status  . Magnesium 08/22/2016 1.5* 1.7 - 2.4 mg/dL Final  . CEA 08/23/2016 5.0* 0.0 - 4.7 ng/mL Final   Comment: (NOTE)       Roche ECLIA methodology       Nonsmokers  <3.9                                     Smokers     <5.6 Performed At: [facility] [address] [place], [place] [id number]  Lindon Romp MD UG:[id number]   . Sodium 08/22/2016 138  135 - 145 mmol/L Final  . Potassium 08/22/2016 3.9  3.5 - 5.1 mmol/L Final  . Chloride 08/22/2016 104  101 - 111 mmol/L Final  . CO2 08/22/2016 25  22 - 32 mmol/L Final  . Glucose, Bld 08/22/2016 134* 65 - 99 mg/dL Final  . BUN 08/22/2016 6  6 - 20 mg/dL Final  . Creatinine, Ser 08/22/2016 0.51  0.44 - 1.00 mg/dL Final  . Calcium 08/22/2016 9.4  8.9 - 10.3 mg/dL Final  . Total Protein 08/22/2016 7.4  6.5 - 8.1 g/dL Final  . Albumin 08/22/2016 3.9  3.5 - 5.0 g/dL Final  . AST 08/22/2016 23  15 - 41 U/L Final  . ALT 08/22/2016 14  14 - 54 U/L Final  . Alkaline Phosphatase 08/22/2016 54  38 - 126 U/L Final  . Total Bilirubin 08/22/2016 0.5  0.3 - 1.2 mg/dL Final  . GFR calc non Af Amer 08/22/2016 >60  >60 mL/min Final  . GFR calc Af Amer 08/22/2016 >60  >60 mL/min Final   Comment: (NOTE) The eGFR has been calculated using the CKD EPI equation. This calculation has not been validated in all clinical situations. eGFR's persistently <60 mL/min signify possible Chronic Kidney Disease.   . Anion gap 08/22/2016 9  5 - 15 Final  . WBC 08/22/2016 7.4  3.6 - 11.0 K/uL Final  . RBC 08/22/2016 4.35  3.80 - 5.20 MIL/uL Final  . Hemoglobin 08/22/2016 14.1  12.0 - 16.0 g/dL Final  . HCT 08/22/2016  40.8  35.0 - 47.0 % Final  . MCV 08/22/2016 93.8  80.0 - 100.0 fL Final  . MCH 08/22/2016 32.5  26.0 - 34.0 pg Final  . MCHC 08/22/2016 34.6  32.0 - 36.0 g/dL Final  . RDW 08/22/2016 14.6* 11.5 - 14.5 % Final  . Platelets 08/22/2016 304  150 - 440 K/uL Final  . Neutrophils Relative % 08/22/2016 78  % Final  . Neutro Abs 08/22/2016 5.8  1.4 - 6.5 K/uL Final  . Lymphocytes Relative 08/22/2016 12  % Final  . Lymphs Abs 08/22/2016 0.9* 1.0 - 3.6 K/uL Final  . Monocytes Relative 08/22/2016 9  % Final  . Monocytes Absolute 08/22/2016 0.6  0.2 - 0.9 K/uL Final  . Eosinophils Relative 08/22/2016 0  % Final  . Eosinophils Absolute 08/22/2016 0.0  0 - 0.7 K/uL Final  . Basophils Relative 08/22/2016 1  % Final  . Basophils Absolute 08/22/2016 0.0  0 - 0.1 K/uL Final    Assessment:  Audrey Mcgee is a 55 y.o. female with metastatic poorly differentiated adenocarcinoma of lung. She presented with a 5 month history of enlarging bilateral neck masses, progressive hoarseness, dysphagia, dyspnea on exertion, cough, left sided hearing loss, neck pain, and a 30 pound weight loss.  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 Biopsy of the right supraclavicular lymph node on 10/03/2014 revealed poorly differentiated non-small cell carcinoma, favoring adenocarcinoma consistent with lung primary (TTF-1 positive). KRAS was positive.   PET scan on 10/24/2014 revealed a left hilar mass with associated left upper lobe obstruction/atelectasis with extensive bilateral mediastinal and cervical adenopathy. Chest CT from 10/10/2014 also noted a 2.2 x 0.9 cm soft tissue density with internal calcifications in the right breast. Head MRI revealed no evidence of metastatic disease.  She received palliative radiation (3000 cGy) to the left lung from 10/24/2014 - 11/07/2014. She has received 3 cycles  of carboplatin and Alimta (11/14/2014, 12/05/2014, and 02/13/2015).    She  has pain in her left chest, throat, and shoulder. She is on oxycodone 10 mg every 6 hours as needed. Morphine causes nausea. She has chronic shortness of breath with exertion.  She received 6 cycles carboplatin and Alimta (11/14/2014 - 04/30/2015). There was a delay between cycle #2 and cycle #3 secondary to a switch in caregivers.  She was diagnosed with a left peri-hilar infiltrate on 02/23/2015. She has completed a course of Levaquin. Chest CT on 03/21/2015 revealed LUL scarring, patchy ill defined airspace opacities in the lingula, upper esophageal thickening, and small mediastinal and hilar nodes. She completed a course of Azithromycin.  She received 2 cycles of maintenance Alimta (05/22/2015 - 06/15/2015). Chest CT on 07/09/2015 revealed patchy ground glass and nodular consolidation in the left upper lobe and to a lesser extent left lower lobe, minimally progressive from 03/21/2015.  Bone scan on 07/09/2015 revealed no evidence of metastatic disease.  CXR on 09/24/2015 revealed patchy left perihilar airspace opacities.  She was treated with azithromycin.  Chest CT on 10/17/2015 revealed patchy ground-glass and ill-defined nodular areas of opacity in the left apex, lingula, and anterior left lower lobe generally appeared decreased in the interval although 1 of the nodular areas in the left apex has progressed slightly since the previous study.  Overall imaging features are felt to be most likely treatment related.  There had been an interval slight decrease in proximal to mid esophageal circumferential wall thickening, potentially treatment related.   She was lost to follow-up for approximately 3 months.  Chest and abdomen CT scan on 02/04/2016 revealed evolving radiation changes in the left upper and left lower lobes. Previously measured left upper lobe nodule was no longer visualized. There was no evidence of metastatic disease.  Right hilar lymph nodes were not considered enlarged by CT  size criteria and are nonspecific, but appeared new from the prior exam.  Bone scan on 02/19/2016 revealed no definite evidence of metastatic disease. There was subtle uptake within the posterior aspects of the upper ribs bilaterally (nonspecific and not entirely new).  There was a tiny focus of increased uptake near the vertex of the calvarium (new).  She has been hoarse since 02/01/2016.  She has received 6 cycles of maintenance Alimta (restarted 02/29/2016 - 06/06/2016; 08/01/2016 - 08/22/2016).  She receives B12 every 9 weeks (last 08/01/2016) and is on daily folic acid.   She has ongoing hoarseness and difficulty swallowing.  She has not had her ENT or swallowing evaluations.  She has had delays in therapy secondary to tooth abscesses.  She has mild visual changes and intermittent headaches.  She was [facility] to [facility] from 06/21/2016 - 06/22/2016 with severe shortness of breath secondary to possible aspiration.  Chest CT angiogram on 06/21/2016 revealed no evidence of pulmonary embolism . There was stable patchy peribronchovascular interstitial thickening, volume loss and patchy reticulation in the left upper lobe and central left lower lobe, presumably representing stable posttreatment change.  Bilateral lower extremity duplex revealed no evidence of thrombosis.  She was discharged on prednisone, Spiriva, and Levaquin.   Chest VT angiogram on 07/08/2016 revealed no evidence of pulmonary embolism.  There was stable patchy peribronchovascular and interstitial thickening with associated bronchiectasis.  She has hyperthyroidism.  She is on methimazole.  TSH was 0.037 (low) and free T4 2.27 (high) on 08/01/2016.    Symptomatically, she feels good.  Weight has improved on Megace.  She has hypomagnesemia (1.5).  Plan: 1.  Labs today:  CBC with diff, CMP, Mg. 2.  Cycle #7 Alimta today. 3.  B12 today.  3.  Follow-up with ENT. 4.  Increase magnesium oxide to TID. 5.  Rx:  ondansetron. 6.  RTC in 3  weeks for MD assessment, labs (CBC with diff, CMP, Mg), and Alimta.   Lequita Asal, MD  10/05/2016, 11:34 AM

## 2016-10-06 ENCOUNTER — Inpatient Hospital Stay: Payer: Medicaid Other | Admitting: Hematology and Oncology

## 2016-10-06 ENCOUNTER — Inpatient Hospital Stay: Payer: Medicaid Other

## 2016-10-23 ENCOUNTER — Other Ambulatory Visit: Payer: Self-pay | Admitting: *Deleted

## 2016-10-23 ENCOUNTER — Telehealth: Payer: Self-pay | Admitting: *Deleted

## 2016-10-23 DIAGNOSIS — C3490 Malignant neoplasm of unspecified part of unspecified bronchus or lung: Secondary | ICD-10-CM

## 2016-10-23 MED ORDER — OXYCODONE HCL 10 MG PO TABS: 10.0000 mg | ORAL_TABLET | ORAL | 0 refills | Status: DC | PRN

## 2016-10-23 NOTE — Telephone Encounter (Signed)
-----   Message from Cephus Richer sent at 10/23/2016  8:08 AM EDT ----- Pt need refill on Oxycodone and Morphine would like to pick it up tomorrow when she get b12 tomorrow. ----- Message ----- From: Lequita Asal, MD Sent: 10/22/2016   4:19 PM To: Cristopher Peru Torain   Questions answered yesterday.  Needs B12 now. Can see me, get labs, and treatment with Alimta 1 week later.  M ----- Message ----- From: Cephus Richer Sent: 10/22/2016   1:40 PM To: Azzie Almas, MD, #  Per pt would like to know when she need to return? Also what will she be getting when she come ? Lab MD or lab md trearment?

## 2016-10-23 NOTE — Addendum Note (Signed)
Addended by: Betti Cruz on: 10/23/2016 09:31 AM   Modules accepted: Orders

## 2016-10-23 NOTE — Telephone Encounter (Signed)
States she was in the bed with the flu and was not able to get out of bed to take her morphine, but was taking her Oxycodone. I told her that per Dr Mike Gip she will give her a weeks worth of Oxycodone and she can discuss the Morphine at her appt next week.

## 2016-10-24 ENCOUNTER — Inpatient Hospital Stay: Payer: Medicaid Other | Attending: Hematology and Oncology

## 2016-10-24 DIAGNOSIS — Z87891 Personal history of nicotine dependence: Secondary | ICD-10-CM | POA: Diagnosis not present

## 2016-10-24 DIAGNOSIS — G8929 Other chronic pain: Secondary | ICD-10-CM | POA: Diagnosis not present

## 2016-10-24 DIAGNOSIS — J3801 Paralysis of vocal cords and larynx, unilateral: Secondary | ICD-10-CM | POA: Diagnosis not present

## 2016-10-24 DIAGNOSIS — K047 Periapical abscess without sinus: Secondary | ICD-10-CM | POA: Insufficient documentation

## 2016-10-24 DIAGNOSIS — M25519 Pain in unspecified shoulder: Secondary | ICD-10-CM | POA: Insufficient documentation

## 2016-10-24 DIAGNOSIS — I10 Essential (primary) hypertension: Secondary | ICD-10-CM | POA: Insufficient documentation

## 2016-10-24 DIAGNOSIS — C3402 Malignant neoplasm of left main bronchus: Secondary | ICD-10-CM | POA: Diagnosis not present

## 2016-10-24 DIAGNOSIS — R490 Dysphonia: Secondary | ICD-10-CM | POA: Diagnosis not present

## 2016-10-24 DIAGNOSIS — Z5111 Encounter for antineoplastic chemotherapy: Secondary | ICD-10-CM | POA: Diagnosis present

## 2016-10-24 DIAGNOSIS — Z809 Family history of malignant neoplasm, unspecified: Secondary | ICD-10-CM | POA: Insufficient documentation

## 2016-10-24 DIAGNOSIS — R51 Headache: Secondary | ICD-10-CM | POA: Insufficient documentation

## 2016-10-24 DIAGNOSIS — Z79899 Other long term (current) drug therapy: Secondary | ICD-10-CM | POA: Insufficient documentation

## 2016-10-24 DIAGNOSIS — R634 Abnormal weight loss: Secondary | ICD-10-CM | POA: Insufficient documentation

## 2016-10-24 DIAGNOSIS — E0590 Thyrotoxicosis, unspecified without thyrotoxic crisis or storm: Secondary | ICD-10-CM | POA: Insufficient documentation

## 2016-10-24 DIAGNOSIS — C7800 Secondary malignant neoplasm of unspecified lung: Secondary | ICD-10-CM | POA: Insufficient documentation

## 2016-10-24 DIAGNOSIS — J449 Chronic obstructive pulmonary disease, unspecified: Secondary | ICD-10-CM | POA: Insufficient documentation

## 2016-10-24 DIAGNOSIS — C3490 Malignant neoplasm of unspecified part of unspecified bronchus or lung: Secondary | ICD-10-CM

## 2016-10-24 DIAGNOSIS — R1310 Dysphagia, unspecified: Secondary | ICD-10-CM | POA: Insufficient documentation

## 2016-10-24 DIAGNOSIS — Z923 Personal history of irradiation: Secondary | ICD-10-CM | POA: Diagnosis not present

## 2016-10-24 MED ORDER — CYANOCOBALAMIN 1000 MCG/ML IJ SOLN
1000.0000 ug | Freq: Once | INTRAMUSCULAR | Status: AC
  Administered 2016-10-24: 1000 ug via INTRAMUSCULAR
  Filled 2016-10-24: qty 1

## 2016-10-31 ENCOUNTER — Inpatient Hospital Stay: Payer: Medicaid Other

## 2016-10-31 ENCOUNTER — Inpatient Hospital Stay (HOSPITAL_BASED_OUTPATIENT_CLINIC_OR_DEPARTMENT_OTHER): Payer: Medicaid Other | Admitting: Hematology and Oncology

## 2016-10-31 VITALS — BP 137/88 | HR 86 | Temp 97.4°F | Resp 18 | Wt 123.2 lb

## 2016-10-31 DIAGNOSIS — G8929 Other chronic pain: Secondary | ICD-10-CM

## 2016-10-31 DIAGNOSIS — C3490 Malignant neoplasm of unspecified part of unspecified bronchus or lung: Secondary | ICD-10-CM

## 2016-10-31 DIAGNOSIS — K047 Periapical abscess without sinus: Secondary | ICD-10-CM

## 2016-10-31 DIAGNOSIS — J3801 Paralysis of vocal cords and larynx, unilateral: Secondary | ICD-10-CM

## 2016-10-31 DIAGNOSIS — C770 Secondary and unspecified malignant neoplasm of lymph nodes of head, face and neck: Secondary | ICD-10-CM

## 2016-10-31 DIAGNOSIS — R1310 Dysphagia, unspecified: Secondary | ICD-10-CM

## 2016-10-31 DIAGNOSIS — G893 Neoplasm related pain (acute) (chronic): Secondary | ICD-10-CM

## 2016-10-31 DIAGNOSIS — J449 Chronic obstructive pulmonary disease, unspecified: Secondary | ICD-10-CM

## 2016-10-31 DIAGNOSIS — C3402 Malignant neoplasm of left main bronchus: Secondary | ICD-10-CM | POA: Diagnosis not present

## 2016-10-31 DIAGNOSIS — E0590 Thyrotoxicosis, unspecified without thyrotoxic crisis or storm: Secondary | ICD-10-CM

## 2016-10-31 DIAGNOSIS — Z809 Family history of malignant neoplasm, unspecified: Secondary | ICD-10-CM

## 2016-10-31 DIAGNOSIS — R490 Dysphonia: Secondary | ICD-10-CM

## 2016-10-31 DIAGNOSIS — Z79899 Other long term (current) drug therapy: Secondary | ICD-10-CM

## 2016-10-31 DIAGNOSIS — Z5111 Encounter for antineoplastic chemotherapy: Secondary | ICD-10-CM | POA: Diagnosis not present

## 2016-10-31 DIAGNOSIS — M25519 Pain in unspecified shoulder: Secondary | ICD-10-CM

## 2016-10-31 DIAGNOSIS — C3492 Malignant neoplasm of unspecified part of left bronchus or lung: Secondary | ICD-10-CM

## 2016-10-31 DIAGNOSIS — Z923 Personal history of irradiation: Secondary | ICD-10-CM

## 2016-10-31 DIAGNOSIS — C7800 Secondary malignant neoplasm of unspecified lung: Secondary | ICD-10-CM

## 2016-10-31 DIAGNOSIS — R51 Headache: Secondary | ICD-10-CM

## 2016-10-31 DIAGNOSIS — Z87891 Personal history of nicotine dependence: Secondary | ICD-10-CM

## 2016-10-31 DIAGNOSIS — R634 Abnormal weight loss: Secondary | ICD-10-CM

## 2016-10-31 DIAGNOSIS — I10 Essential (primary) hypertension: Secondary | ICD-10-CM

## 2016-10-31 LAB — CBC WITH DIFFERENTIAL/PLATELET
Basophils Absolute: 0 10*3/uL (ref 0–0.1)
Basophils Relative: 1 %
Eosinophils Absolute: 0 10*3/uL (ref 0–0.7)
Eosinophils Relative: 0 %
HCT: 42 % (ref 35.0–47.0)
Hemoglobin: 14.8 g/dL (ref 12.0–16.0)
Lymphocytes Relative: 13 %
Lymphs Abs: 1.1 10*3/uL (ref 1.0–3.6)
MCH: 33.7 pg (ref 26.0–34.0)
MCHC: 35.2 g/dL (ref 32.0–36.0)
MCV: 95.7 fL (ref 80.0–100.0)
Monocytes Absolute: 0.3 10*3/uL (ref 0.2–0.9)
Monocytes Relative: 4 %
Neutro Abs: 6.6 10*3/uL — ABNORMAL HIGH (ref 1.4–6.5)
Neutrophils Relative %: 82 %
Platelets: 236 10*3/uL (ref 150–440)
RBC: 4.39 MIL/uL (ref 3.80–5.20)
RDW: 16.2 % — ABNORMAL HIGH (ref 11.5–14.5)
WBC: 8 10*3/uL (ref 3.6–11.0)

## 2016-10-31 LAB — COMPREHENSIVE METABOLIC PANEL
ALT: 14 U/L (ref 14–54)
AST: 34 U/L (ref 15–41)
Albumin: 4.4 g/dL (ref 3.5–5.0)
Alkaline Phosphatase: 50 U/L (ref 38–126)
Anion gap: 13 (ref 5–15)
BUN: 6 mg/dL (ref 6–20)
CO2: 22 mmol/L (ref 22–32)
Calcium: 9.8 mg/dL (ref 8.9–10.3)
Chloride: 102 mmol/L (ref 101–111)
Creatinine, Ser: 1.11 mg/dL — ABNORMAL HIGH (ref 0.44–1.00)
GFR calc Af Amer: >60 mL/min (ref >60)
GFR calc non Af Amer: 56 mL/min — ABNORMAL LOW (ref >60)
Glucose, Bld: 170 mg/dL — ABNORMAL HIGH (ref 65–99)
Potassium: 4 mmol/L (ref 3.5–5.1)
Sodium: 137 mmol/L (ref 135–145)
Total Bilirubin: 0.7 mg/dL (ref 0.3–1.2)
Total Protein: 7.9 g/dL (ref 6.5–8.1)

## 2016-10-31 LAB — URINE DRUG SCREEN, QUALITATIVE (ARMC ONLY)
Amphetamines, Ur Screen: NOT DETECTED
Barbiturates, Ur Screen: NOT DETECTED
Benzodiazepine, Ur Scrn: NOT DETECTED
Cannabinoid 50 Ng, Ur ~~LOC~~: POSITIVE — AB
Cocaine Metabolite,Ur ~~LOC~~: NOT DETECTED
MDMA (Ecstasy)Ur Screen: NOT DETECTED
Methadone Scn, Ur: NOT DETECTED
Opiate, Ur Screen: NOT DETECTED
Phencyclidine (PCP) Ur S: NOT DETECTED
Tricyclic, Ur Screen: NOT DETECTED

## 2016-10-31 LAB — MAGNESIUM: Magnesium: 1.8 mg/dL (ref 1.7–2.4)

## 2016-10-31 MED ORDER — OXYCODONE HCL 10 MG PO TABS: 10.0000 mg | ORAL_TABLET | ORAL | 0 refills | Status: DC | PRN

## 2016-10-31 MED ORDER — MORPHINE SULFATE ER 15 MG PO TBCR: 15.0000 mg | EXTENDED_RELEASE_TABLET | Freq: Two times a day (BID) | ORAL | 0 refills | Status: DC

## 2016-10-31 MED ORDER — PEMETREXED DISODIUM CHEMO INJECTION 500 MG
500.0000 mg/m2 | Freq: Once | INTRAVENOUS | Status: AC
  Administered 2016-10-31: 750 mg via INTRAVENOUS
  Filled 2016-10-31: qty 20

## 2016-10-31 MED ORDER — SODIUM CHLORIDE 0.9 % IV SOLN
Freq: Once | INTRAVENOUS | Status: AC
  Administered 2016-10-31: 16:00:00 via INTRAVENOUS
  Filled 2016-10-31: qty 1000

## 2016-10-31 MED ORDER — DEXAMETHASONE SODIUM PHOSPHATE 10 MG/ML IJ SOLN
10.0000 mg | Freq: Once | INTRAMUSCULAR | Status: AC
  Administered 2016-10-31: 10 mg via INTRAVENOUS
  Filled 2016-10-31: qty 1

## 2016-10-31 MED ORDER — DEXAMETHASONE 4 MG PO TABS: ORAL_TABLET | ORAL | 0 refills | Status: DC

## 2016-10-31 MED ORDER — ONDANSETRON 8 MG PO TBDP
8.0000 mg | ORAL_TABLET | Freq: Once | ORAL | Status: AC
  Administered 2016-10-31: 8 mg via ORAL
  Filled 2016-10-31: qty 1

## 2016-10-31 NOTE — Progress Notes (Signed)
[facility] day:  10/31/2016   Chief Complaint: Audrey Mcgee is a 55 y.o. female with metastatic poorly differentiated non-small cell lung cancer who is seen for assessment prior to cycle #6 Alimta.  HPI: The patient was last seen in the medical oncology clinic by me on 08/01/2016.  At that time, she was feeling better, but losing weight.  She received cycle #5 Alimta.  She was received a prescription for Megace.  She was to have increased her magnesium from BID to TID.  Her ENT appointment was rescheduled.  She was lost to follow-up.  She states that she had the flu.  She describes taking over the counter medications including DayQuil.  She has been "better for the past 2 weeks".  She called about rescheduling her chemotherapy.  She was told she needed B12 then could schedule her chemotherapy for the following week.  She received B12 on 10/24/2016.  She requested and received a small prescription for her pain medications.  She states that she needs to reschedule her appointment with ENT and her CT scan to further evaluate her left vocal cord paralysis.  Symptomatically, she feels "good today".  She notes a little headache and shoulder pain.   Past Medical History:  Diagnosis Date  . COPD (chronic obstructive pulmonary disease) ([place])   . Hypertension   . Lung cancer [facility])     Past Surgical History:  Procedure Laterality Date  . CESAREAN SECTION CLASSICAL    . STOMACH SURGERY     Pt reports for a tumor    Family History  Problem Relation Age of Onset  . Hypertension Father   . Cancer Father   . Diabetes Father   . Cancer Maternal Aunt     Social History:  reports that she has quit smoking. Her smoking use included Cigarettes. She quit after 15.00 years of use. She has never used smokeless tobacco. She reports that she does not drink alcohol or use drugs.  She stopped smoking.  Her nephew shot in [place] (caused delays in  her treatment).  Her boyfriend's father  died of metastatic lung cancer.  Her boyfriend works at a school with children.  The patient's 1st cousin died (age 28) of a cerebral aneurysm.  The patient is alone today.    Allergies:  Allergies  Allergen Reactions  . No Known Allergies     Current Medications: Current Outpatient Prescriptions  Medication Sig Dispense Refill  . benzonatate (TESSALON) 100 MG capsule Take 1 capsule (100 mg total) by mouth 3 (three) times daily as needed for cough. 20 capsule 0  . dexamethasone (DECADRON) 4 MG tablet Take 85m BID starting day before treatment for a total of 3 days. (Patient taking differently: Take 4 mg by mouth 2 (two) times daily. Starting the day before treatment for 3 days) 20 tablet 0  . docusate sodium (COLACE) 100 MG capsule Take 100 mg by mouth 2 (two) times daily as needed for mild constipation.    . folic acid (FOLVITE) 8248MCG tablet Take 800 mcg by mouth daily.     .Marland Kitchenlidocaine (XYLOCAINE) 2 % solution Use as directed 10 mLs in the mouth or throat every 4 (four) hours as needed for mouth pain.    .Marland Kitchenlosartan (COZAAR) 100 MG tablet Take 1 tablet (100 mg total) by mouth daily. 30 tablet 0  . magic mouthwash w/lidocaine SOLN Take 5 mLs by mouth 4 (four) times daily as needed  for mouth pain. 480 mL 1  . magnesium oxide (MAG-OX) 400 MG tablet Take 1 tablet (400 mg total) by mouth daily. 30 tablet 0  . megestrol (MEGACE) 40 MG/ML suspension Take 5 mLs (200 mg total) by mouth 2 (two) times daily. 300 mL 1  . methimazole (TAPAZOLE) 5 MG tablet take 1 tablet by mouth twice a day for OVERACTIVE THYROID  0  . metoprolol (LOPRESSOR) 50 MG tablet take 1 tablet by mouth twice a day NOTE DOSE INCREASE  0  . morphine (MS CONTIN) 15 MG 12 hr tablet Take 1 tablet (15 mg total) by mouth 2 (two) times daily. 60 tablet 0  . omeprazole (PRILOSEC) 20 MG capsule Take 1 capsule (20 mg total) by mouth daily. 30 capsule 3  . ondansetron (ZOFRAN) 8 MG tablet Take 1  tablet (8 mg total) by mouth 2 (two) times daily as needed for nausea or vomiting. 30 tablet 1  . Oxycodone HCl 10 MG TABS Take 1 tablet (10 mg total) by mouth every 4 (four) hours as needed (pain). 30 tablet 0  . tiotropium (SPIRIVA) 18 MCG inhalation capsule Place 1 capsule (18 mcg total) into inhaler and inhale daily. 30 capsule 1  . predniSONE (STERAPRED UNI-PAK 21 TAB) 10 MG (21) TBPK tablet Take 1 tablet (10 mg total) by mouth daily. 6 tab lets on day1 5 tab on day 2 4 tab daily for day 3 3 tab daily for 4 2 tab daily for 5 1 tab daily for 6 (Patient not taking: Reported on 10/31/2016) 21 tablet 0   No current facility-administered medications for this visit.     Review of Systems:  GENERAL: Feels "good". No fevers or chills. Weight stable. PERFORMANCE STATUS (ECOG): 1 HEENT: Chronic hoarseness (see HPI).  No sore throat, mouth sores or tenderness. Lungs:  Shortness of breath with exertion (stable).  Chronic cough.  No hemoptysis. Cardiac: No chest pain, palpitations, orthopnea, or PND. GI: Nausea x 3 days post chemotherapy.  No vomiting, diarrhea, constipation, melena or hematochezia. GU: No urgency, frequency, dysuria, or hematuria. Musculoskeletal: Right shoulder pain.No muscle tenderness. Extremities: No pain or swelling. Skin: No rashes or skin changes. Neuro: Headache (rare).  No numbness or weakness, balance or coordination issues. Endocrine: No diabetes.  Thyroid issues.  No hot flashes or night sweats. Psych: No mood changes, depression or anxiety. Pain: Pain in shoulder (stable). Review of systems: All other systems reviewed and found to be negative.  Physical Exam: Blood pressure 137/88, pulse 86, temperature 97.4 F (36.3 C), temperature source Tympanic, resp. rate 18, weight 123 lb 3.8 oz (55.9 kg).  GENERAL: Thin woman sitting comfortably in the exam room in no acute distress. MENTAL STATUS: Alert and oriented to person, place and time. HEAD:  Short dark styled wig Normocephalic, atraumatic, no Cushingoid features.  EYES: Brown eyes. Ruddy sclera.  Pupils equal round and reactive to light and accomodation. No conjunctivitis or scleral icterus. ENT: Hoarse.  Poor dentition.  Oropharynx clear without lesion. Tongue normal. Mucous membranes moist.  RESPIRATORY: Clear to auscultation without rales, wheezes or rhonchi. CARDIOVASCULAR: Regular rate and rhythm without murmur, rub or gallop. ABDOMEN: Soft, non-tender, with active bowel sounds, and no hepatosplenomegaly. No masses. SKIN: No rashes, ulcers or lesions. EXTREMITIES: No edema, skin discoloration or tenderness. No palpable cords. LYMPH NODES: No palpable cervical, supraclavicular, axillary or inguinal adenopathy  NEUROLOGICAL: Unremarkable. PSYCH: Appropriate.   Appointment on 10/31/2016  Component Date Value Ref Range Status  . Magnesium 10/31/2016 1.8  1.7 - 2.4 mg/dL Final  . WBC 10/31/2016 8.0  3.6 - 11.0 K/uL Final  . RBC 10/31/2016 4.39  3.80 - 5.20 MIL/uL Final  . Hemoglobin 10/31/2016 14.8  12.0 - 16.0 g/dL Final  . HCT 10/31/2016 42.0  35.0 - 47.0 % Final  . MCV 10/31/2016 95.7  80.0 - 100.0 fL Final  . MCH 10/31/2016 33.7  26.0 - 34.0 pg Final  . MCHC 10/31/2016 35.2  32.0 - 36.0 g/dL Final  . RDW 10/31/2016 16.2* 11.5 - 14.5 % Final  . Platelets 10/31/2016 236  150 - 440 K/uL Final  . Neutrophils Relative % 10/31/2016 82  % Final  . Neutro Abs 10/31/2016 6.6* 1.4 - 6.5 K/uL Final  . Lymphocytes Relative 10/31/2016 13  % Final  . Lymphs Abs 10/31/2016 1.1  1.0 - 3.6 K/uL Final  . Monocytes Relative 10/31/2016 4  % Final  . Monocytes Absolute 10/31/2016 0.3  0.2 - 0.9 K/uL Final  . Eosinophils Relative 10/31/2016 0  % Final  . Eosinophils Absolute 10/31/2016 0.0  0 - 0.7 K/uL Final  . Basophils Relative 10/31/2016 1  % Final  . Basophils Absolute 10/31/2016 0.0  0 - 0.1 K/uL Final  . Sodium 10/31/2016 137  135 - 145 mmol/L Final  . Potassium  10/31/2016 4.0  3.5 - 5.1 mmol/L Final  . Chloride 10/31/2016 102  101 - 111 mmol/L Final  . CO2 10/31/2016 22  22 - 32 mmol/L Final  . Glucose, Bld 10/31/2016 170* 65 - 99 mg/dL Final  . BUN 10/31/2016 6  6 - 20 mg/dL Final  . Creatinine, Ser 10/31/2016 1.11* 0.44 - 1.00 mg/dL Final  . Calcium 10/31/2016 9.8  8.9 - 10.3 mg/dL Final  . Total Protein 10/31/2016 7.9  6.5 - 8.1 g/dL Final  . Albumin 10/31/2016 4.4  3.5 - 5.0 g/dL Final  . AST 10/31/2016 34  15 - 41 U/L Final  . ALT 10/31/2016 14  14 - 54 U/L Final  . Alkaline Phosphatase 10/31/2016 50  38 - 126 U/L Final  . Total Bilirubin 10/31/2016 0.7  0.3 - 1.2 mg/dL Final  . GFR calc non Af Amer 10/31/2016 56* >60 mL/min Final  . GFR calc Af Amer 10/31/2016 >60  >60 mL/min Final   Comment: (NOTE) The eGFR has been calculated using the CKD EPI equation. This calculation has not been validated in all clinical situations. eGFR's persistently <60 mL/min signify possible Chronic Kidney Disease.   . Anion gap 10/31/2016 13  5 - 15 Final    Assessment:  Margarette Vannatter is a 55 y.o. female with metastatic poorly differentiated adenocarcinoma of lung. She presented with a 5 month history of enlarging bilateral neck masses, progressive hoarseness, dysphagia, dyspnea on exertion, cough, left sided hearing loss, neck pain, and a 30 pound weight loss.  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 Biopsy of the right supraclavicular lymph node on 10/03/2014 revealed poorly differentiated non-small cell carcinoma, favoring adenocarcinoma consistent with lung primary (TTF-1 positive). KRAS was positive.   PET scan on 10/24/2014 revealed a left hilar mass with associated left upper lobe obstruction/atelectasis with extensive bilateral mediastinal and cervical adenopathy. Chest CT from 10/10/2014 also noted a 2.2 x 0.9 cm soft tissue density with internal calcifications in the right breast. Head MRI  revealed no evidence of metastatic disease.  She received palliative radiation (3000 cGy) to the left lung from 10/24/2014 - 11/07/2014. She received 6 cycles carboplatin and Alimta (  11/14/2014 - 04/30/2015). There was a delay between cycle #2 and cycle #3 secondary to a switch in caregivers.  She was diagnosed with a left peri-hilar infiltrate on 02/23/2015. She has completed a course of Levaquin. Chest CT on 03/21/2015 revealed LUL scarring, patchy ill defined airspace opacities in the lingula, upper esophageal thickening, and small mediastinal and hilar nodes. She completed a course of Azithromycin.  She received 2 cycles of maintenance Alimta (05/22/2015 - 06/15/2015). Chest CT on 07/09/2015 revealed patchy ground glass and nodular consolidation in the left upper lobe and to a lesser extent left lower lobe, minimally progressive from 03/21/2015.  Bone scan on 07/09/2015 revealed no evidence of metastatic disease.  CXR on 09/24/2015 revealed patchy left perihilar airspace opacities.  She was treated with azithromycin.  Chest CT on 10/17/2015 revealed patchy ground-glass and ill-defined nodular areas of opacity in the left apex, lingula, and anterior left lower lobe generally appeared decreased in the interval although 1 of the nodular areas in the left apex has progressed slightly since the previous study.  Overall imaging features are felt to be most likely treatment related.  There had been an interval slight decrease in proximal to mid esophageal circumferential wall thickening, potentially treatment related.   She was lost to follow-up for approximately 3 months.  Chest and abdomen CT scan on 02/04/2016 revealed evolving radiation changes in the left upper and left lower lobes. Previously measured left upper lobe nodule was no longer visualized. There was no evidence of metastatic disease.  Right hilar lymph nodes were not considered enlarged by CT size criteria and are nonspecific, but  appeared new from the prior exam.  Bone scan on 02/19/2016 revealed no definite evidence of metastatic disease. There was subtle uptake within the posterior aspects of the upper ribs bilaterally (nonspecific and not entirely new).  There was a tiny focus of increased uptake near the vertex of the calvarium (new).  She has been hoarse since 02/01/2016.  She has ongoing hoarseness and difficulty swallowing.  She has not had her ENT or swallowing evaluations.  She has had delays in therapy secondary to tooth abscesses.  She has had another delay in therapy secondary to "the flu".   She has received 5 cycles of maintenance Alimta (restarted 02/29/2016 - 06/06/2016; 08/01/2016).  She receives B12 every 9 weeks (last 10/24/2016) and is on daily folic acid.   CEA has been monitored: 3.3 on 02/29/2016, 4.1 on 03/28/2016, 4.2 on 04/17/2016, 5.0 on 08/22/2016 and 9.4 on 10/31/2016.  She was admitted to [facility] from 06/21/2016 - 06/22/2016 with severe shortness of breath secondary to possible aspiration.  Chest CT angiogram on 06/21/2016 revealed no evidence of pulmonary embolism . There was stable patchy peribronchovascular interstitial thickening, volume loss and patchy reticulation in the left upper lobe and central left lower lobe, presumably representing stable posttreatment change.  Bilateral lower extremity duplex revealed no evidence of thrombosis.  She was discharged on prednisone, Spiriva, and Levaquin.   She has hyperthyroidism.  She is on methimazole.  TSH was 0.037 (low) and free T4 2.27 (high) on 08/01/2016.    Symptomatically, she feels good today.  She has chronic shoulder pain.  Weight is stable.  Magnesium is 1.8.  Plan: 1.  Labs today:  CBC with diff, CMP, Mg, CEA. 2.  Cycle #6 maintenance Alimta today. 3.  Re-review importance of folic acid and Decadron while taking Alimta. 4.  Schedule restaging PET scan. 5.  Discuss need to reschedule appointments with ENT. 6.  Rx: oxycodone 10 mg po q  4 hours prn pain. 7.  Rx:  MS Contin 15 mg po q 12 hours 8.  Rx:  Decadron 4 mg BID day before and after chemotherapy. 9.  Urine drug screen. 10.  RTC after PET scan.  Addendum:  Patient was contacted regarding her creatinine.  She was encouraged to drink fluids.  Creatinine will be rechecked at next appointment.   Lequita Asal, MD  10/31/2016, 2:40 PM

## 2016-10-31 NOTE — Progress Notes (Signed)
Patient asking for refills on Decadron, Morphine and Oxycodone.  States she has had the flu for the past month.  Sometimes hard to swallow solids.

## 2016-11-01 LAB — CEA: CEA: 9.4 ng/mL — ABNORMAL HIGH (ref 0.0–4.7)

## 2016-11-02 ENCOUNTER — Encounter: Payer: Self-pay | Admitting: Hematology and Oncology

## 2016-11-06 ENCOUNTER — Ambulatory Visit: Payer: Medicaid Other

## 2016-11-07 ENCOUNTER — Inpatient Hospital Stay: Payer: Medicaid Other

## 2016-11-07 ENCOUNTER — Inpatient Hospital Stay: Payer: Medicaid Other | Admitting: Hematology and Oncology

## 2016-11-07 ENCOUNTER — Inpatient Hospital Stay
Admission: EM | Admit: 2016-11-07 | Discharge: 2016-11-11 | DRG: 871 | Disposition: A | Discharge status: Home Health | Payer: Medicaid Other | Attending: Internal Medicine | Admitting: Internal Medicine

## 2016-11-07 ENCOUNTER — Encounter: Payer: Self-pay | Admitting: Emergency Medicine

## 2016-11-07 ENCOUNTER — Emergency Department: Payer: Medicaid Other

## 2016-11-07 ENCOUNTER — Other Ambulatory Visit: Payer: Self-pay

## 2016-11-07 DIAGNOSIS — Z809 Family history of malignant neoplasm, unspecified: Secondary | ICD-10-CM

## 2016-11-07 DIAGNOSIS — Y95 Nosocomial condition: Secondary | ICD-10-CM | POA: Diagnosis present

## 2016-11-07 DIAGNOSIS — A419 Sepsis, unspecified organism: Principal | ICD-10-CM | POA: Diagnosis present

## 2016-11-07 DIAGNOSIS — E43 Unspecified severe protein-calorie malnutrition: Secondary | ICD-10-CM | POA: Diagnosis present

## 2016-11-07 DIAGNOSIS — Z87891 Personal history of nicotine dependence: Secondary | ICD-10-CM

## 2016-11-07 DIAGNOSIS — C3490 Malignant neoplasm of unspecified part of unspecified bronchus or lung: Secondary | ICD-10-CM | POA: Diagnosis present

## 2016-11-07 DIAGNOSIS — Z79899 Other long term (current) drug therapy: Secondary | ICD-10-CM

## 2016-11-07 DIAGNOSIS — E876 Hypokalemia: Secondary | ICD-10-CM | POA: Diagnosis present

## 2016-11-07 DIAGNOSIS — Z6829 Body mass index (BMI) 29.0-29.9, adult: Secondary | ICD-10-CM | POA: Diagnosis not present

## 2016-11-07 DIAGNOSIS — M6281 Muscle weakness (generalized): Secondary | ICD-10-CM

## 2016-11-07 DIAGNOSIS — R531 Weakness: Secondary | ICD-10-CM | POA: Diagnosis not present

## 2016-11-07 DIAGNOSIS — I10 Essential (primary) hypertension: Secondary | ICD-10-CM | POA: Diagnosis present

## 2016-11-07 DIAGNOSIS — J189 Pneumonia, unspecified organism: Secondary | ICD-10-CM | POA: Diagnosis present

## 2016-11-07 DIAGNOSIS — E0590 Thyrotoxicosis, unspecified without thyrotoxic crisis or storm: Secondary | ICD-10-CM | POA: Diagnosis present

## 2016-11-07 DIAGNOSIS — D72818 Other decreased white blood cell count: Secondary | ICD-10-CM | POA: Diagnosis present

## 2016-11-07 DIAGNOSIS — J440 Chronic obstructive pulmonary disease with acute lower respiratory infection: Secondary | ICD-10-CM | POA: Diagnosis present

## 2016-11-07 DIAGNOSIS — R1310 Dysphagia, unspecified: Secondary | ICD-10-CM | POA: Diagnosis present

## 2016-11-07 DIAGNOSIS — Z7952 Long term (current) use of systemic steroids: Secondary | ICD-10-CM | POA: Diagnosis not present

## 2016-11-07 DIAGNOSIS — Z79891 Long term (current) use of opiate analgesic: Secondary | ICD-10-CM | POA: Diagnosis not present

## 2016-11-07 HISTORY — DX: Disorder of thyroid, unspecified: E07.9

## 2016-11-07 LAB — URINALYSIS COMPLETE WITH MICROSCOPIC (ARMC ONLY)
Bilirubin Urine: NEGATIVE
Glucose, UA: NEGATIVE mg/dL
Hgb urine dipstick: NEGATIVE
Leukocytes, UA: NEGATIVE
Nitrite: NEGATIVE
Protein, ur: 30 mg/dL — AB
Specific Gravity, Urine: 1.018 (ref 1.005–1.030)
pH: 5 (ref 5.0–8.0)

## 2016-11-07 LAB — COMPREHENSIVE METABOLIC PANEL
ALK PHOS: 39 U/L (ref 38–126)
ALT: 11 U/L — AB (ref 14–54)
ANION GAP: 14 (ref 5–15)
AST: 29 U/L (ref 15–41)
Albumin: 3.5 g/dL (ref 3.5–5.0)
BILIRUBIN TOTAL: 2.5 mg/dL — AB (ref 0.3–1.2)
BUN: 11 mg/dL (ref 6–20)
CALCIUM: 8.5 mg/dL — AB (ref 8.9–10.3)
CO2: 25 mmol/L (ref 22–32)
CREATININE: 1.04 mg/dL — AB (ref 0.44–1.00)
Chloride: 91 mmol/L — ABNORMAL LOW (ref 101–111)
Glucose, Bld: 92 mg/dL (ref 65–99)
Potassium: 2.6 mmol/L — CL (ref 3.5–5.1)
Sodium: 130 mmol/L — ABNORMAL LOW (ref 135–145)
TOTAL PROTEIN: 7.5 g/dL (ref 6.5–8.1)

## 2016-11-07 LAB — TSH: TSH: 33.139 u[IU]/mL — ABNORMAL HIGH (ref 0.350–4.500)

## 2016-11-07 LAB — BASIC METABOLIC PANEL
Anion gap: 8 (ref 5–15)
BUN: 8 mg/dL (ref 6–20)
CHLORIDE: 100 mmol/L — AB (ref 101–111)
CO2: 25 mmol/L (ref 22–32)
CREATININE: 0.89 mg/dL (ref 0.44–1.00)
Calcium: 7.3 mg/dL — ABNORMAL LOW (ref 8.9–10.3)
GFR calc Af Amer: >60 mL/min (ref >60)
GFR calc non Af Amer: >60 mL/min (ref >60)
GLUCOSE: 94 mg/dL (ref 65–99)
POTASSIUM: 3.6 mmol/L (ref 3.5–5.1)
SODIUM: 133 mmol/L — AB (ref 135–145)

## 2016-11-07 LAB — MRSA PCR SCREENING: MRSA by PCR: NEGATIVE

## 2016-11-07 LAB — CBC
HCT: 44.7 % (ref 35.0–47.0)
HEMOGLOBIN: 15.7 g/dL (ref 12.0–16.0)
MCH: 33.6 pg (ref 26.0–34.0)
MCHC: 35 g/dL (ref 32.0–36.0)
MCV: 96 fL (ref 80.0–100.0)
Platelets: 145 10*3/uL — ABNORMAL LOW (ref 150–440)
RBC: 4.66 MIL/uL (ref 3.80–5.20)
RDW: 15.2 % — ABNORMAL HIGH (ref 11.5–14.5)
WBC: 1.8 10*3/uL — ABNORMAL LOW (ref 3.6–11.0)

## 2016-11-07 LAB — MAGNESIUM: MAGNESIUM: 1.5 mg/dL — AB (ref 1.7–2.4)

## 2016-11-07 LAB — LACTIC ACID, PLASMA: Lactic Acid, Venous: 1.6 mmol/L (ref 0.5–1.9)

## 2016-11-07 MED ORDER — MORPHINE SULFATE (PF) 4 MG/ML IV SOLN
2.0000 mg | Freq: Once | INTRAVENOUS | Status: DC
  Filled 2016-11-07: qty 1

## 2016-11-07 MED ORDER — ONDANSETRON HCL 4 MG PO TABS: 4.0000 mg | ORAL_TABLET | Freq: Four times a day (QID) | ORAL | Status: DC | PRN

## 2016-11-07 MED ORDER — METOPROLOL TARTRATE 50 MG PO TABS
50.0000 mg | ORAL_TABLET | Freq: Two times a day (BID) | ORAL | Status: DC
  Administered 2016-11-07 – 2016-11-11 (×6): 50 mg via ORAL
  Filled 2016-11-07 (×6): qty 1

## 2016-11-07 MED ORDER — ONDANSETRON HCL 4 MG PO TABS: 8.0000 mg | ORAL_TABLET | Freq: Two times a day (BID) | ORAL | Status: DC | PRN

## 2016-11-07 MED ORDER — BISACODYL 5 MG PO TBEC: 5.0000 mg | DELAYED_RELEASE_TABLET | Freq: Every day | ORAL | Status: DC | PRN

## 2016-11-07 MED ORDER — MAGIC MOUTHWASH: 5.0000 mL | Freq: Four times a day (QID) | ORAL | Status: DC | PRN

## 2016-11-07 MED ORDER — MAGIC MOUTHWASH W/LIDOCAINE: 5.0000 mL | Freq: Four times a day (QID) | ORAL | Status: DC | PRN

## 2016-11-07 MED ORDER — ONDANSETRON HCL 4 MG/2ML IJ SOLN
INTRAMUSCULAR | Status: AC
  Filled 2016-11-07: qty 2

## 2016-11-07 MED ORDER — VANCOMYCIN HCL IN DEXTROSE 750-5 MG/150ML-% IV SOLN
750.0000 mg | INTRAVENOUS | Status: DC
  Filled 2016-11-07: qty 150

## 2016-11-07 MED ORDER — LEVOFLOXACIN IN D5W 750 MG/150ML IV SOLN: 750.0000 mg | INTRAVENOUS | Status: DC

## 2016-11-07 MED ORDER — OXYCODONE HCL 5 MG PO TABS
10.0000 mg | ORAL_TABLET | ORAL | Status: DC | PRN
  Administered 2016-11-07: 5 mg via ORAL
  Filled 2016-11-07: qty 1

## 2016-11-07 MED ORDER — SODIUM CHLORIDE 0.9% FLUSH
3.0000 mL | Freq: Two times a day (BID) | INTRAVENOUS | Status: DC
  Administered 2016-11-08 – 2016-11-10 (×3): 3 mL via INTRAVENOUS

## 2016-11-07 MED ORDER — ONDANSETRON HCL 4 MG/2ML IJ SOLN
4.0000 mg | Freq: Once | INTRAMUSCULAR | Status: AC
  Administered 2016-11-07: 4 mg via INTRAVENOUS
  Filled 2016-11-07: qty 2

## 2016-11-07 MED ORDER — LOSARTAN POTASSIUM 50 MG PO TABS
100.0000 mg | ORAL_TABLET | Freq: Every day | ORAL | Status: DC
  Administered 2016-11-08 – 2016-11-11 (×4): 100 mg via ORAL
  Filled 2016-11-07 (×4): qty 2

## 2016-11-07 MED ORDER — ENOXAPARIN SODIUM 40 MG/0.4ML ~~LOC~~ SOLN
40.0000 mg | SUBCUTANEOUS | Status: DC
  Administered 2016-11-07 – 2016-11-10 (×3): 40 mg via SUBCUTANEOUS
  Filled 2016-11-07 (×4): qty 0.4

## 2016-11-07 MED ORDER — FOLIC ACID 1 MG PO TABS
1.0000 mg | ORAL_TABLET | Freq: Every day | ORAL | Status: DC
  Administered 2016-11-07 – 2016-11-11 (×5): 1 mg via ORAL
  Filled 2016-11-07 (×5): qty 1

## 2016-11-07 MED ORDER — ONDANSETRON HCL 4 MG/2ML IJ SOLN: 4.0000 mg | Freq: Once | INTRAMUSCULAR | Status: DC

## 2016-11-07 MED ORDER — ACETAMINOPHEN 325 MG PO TABS: 650.0000 mg | ORAL_TABLET | Freq: Four times a day (QID) | ORAL | Status: DC | PRN

## 2016-11-07 MED ORDER — MAGNESIUM CITRATE PO SOLN
1.0000 | Freq: Every day | ORAL | Status: DC | PRN
  Filled 2016-11-07: qty 296

## 2016-11-07 MED ORDER — DOCUSATE SODIUM 100 MG PO CAPS
100.0000 mg | ORAL_CAPSULE | Freq: Two times a day (BID) | ORAL | Status: DC
  Administered 2016-11-07 – 2016-11-09 (×5): 100 mg via ORAL
  Filled 2016-11-07 (×9): qty 1

## 2016-11-07 MED ORDER — MAGNESIUM OXIDE 400 (241.3 MG) MG PO TABS
400.0000 mg | ORAL_TABLET | Freq: Every day | ORAL | Status: DC
  Administered 2016-11-07 – 2016-11-11 (×5): 400 mg via ORAL
  Filled 2016-11-07 (×5): qty 1

## 2016-11-07 MED ORDER — BENZONATATE 100 MG PO CAPS: 100.0000 mg | ORAL_CAPSULE | Freq: Three times a day (TID) | ORAL | Status: DC | PRN

## 2016-11-07 MED ORDER — MEGESTROL ACETATE 40 MG/ML PO SUSP
200.0000 mg | Freq: Two times a day (BID) | ORAL | Status: DC
  Administered 2016-11-07 – 2016-11-11 (×9): 200 mg via ORAL
  Filled 2016-11-07 (×9): qty 5

## 2016-11-07 MED ORDER — VANCOMYCIN HCL IN DEXTROSE 750-5 MG/150ML-% IV SOLN
750.0000 mg | Freq: Once | INTRAVENOUS | Status: DC
  Administered 2016-11-07: 750 mg via INTRAVENOUS
  Filled 2016-11-07: qty 150

## 2016-11-07 MED ORDER — ACETAMINOPHEN 650 MG RE SUPP: 650.0000 mg | Freq: Four times a day (QID) | RECTAL | Status: DC | PRN

## 2016-11-07 MED ORDER — LEVOFLOXACIN IN D5W 250 MG/50ML IV SOLN
250.0000 mg | Freq: Once | INTRAVENOUS | Status: DC
  Filled 2016-11-07: qty 50

## 2016-11-07 MED ORDER — METHIMAZOLE 5 MG PO TABS
5.0000 mg | ORAL_TABLET | Freq: Every day | ORAL | Status: DC
  Administered 2016-11-07 – 2016-11-11 (×5): 5 mg via ORAL
  Filled 2016-11-07 (×5): qty 1

## 2016-11-07 MED ORDER — TIOTROPIUM BROMIDE MONOHYDRATE 18 MCG IN CAPS
18.0000 ug | ORAL_CAPSULE | Freq: Every day | RESPIRATORY_TRACT | Status: DC
  Administered 2016-11-07 – 2016-11-11 (×5): 18 ug via RESPIRATORY_TRACT
  Filled 2016-11-07: qty 5

## 2016-11-07 MED ORDER — LIDOCAINE VISCOUS 2 % MT SOLN: 10.0000 mL | OROMUCOSAL | Status: DC | PRN

## 2016-11-07 MED ORDER — VANCOMYCIN HCL IN DEXTROSE 750-5 MG/150ML-% IV SOLN
750.0000 mg | Freq: Once | INTRAVENOUS | Status: DC
  Filled 2016-11-07: qty 150

## 2016-11-07 MED ORDER — LEVOFLOXACIN IN D5W 250 MG/50ML IV SOLN
250.0000 mg | Freq: Once | INTRAVENOUS | Status: AC
  Administered 2016-11-07: 18:00:00 250 mg via INTRAVENOUS
  Filled 2016-11-07: qty 50

## 2016-11-07 MED ORDER — SODIUM CHLORIDE 0.9 % IV BOLUS (SEPSIS)
1000.0000 mL | Freq: Once | INTRAVENOUS | Status: AC
  Administered 2016-11-07: 1000 mL via INTRAVENOUS

## 2016-11-07 MED ORDER — POTASSIUM CHLORIDE CRYS ER 20 MEQ PO TBCR
40.0000 meq | EXTENDED_RELEASE_TABLET | ORAL | Status: AC
  Filled 2016-11-07: qty 2

## 2016-11-07 MED ORDER — MORPHINE SULFATE (PF) 4 MG/ML IV SOLN: 4.0000 mg | Freq: Once | INTRAVENOUS | Status: DC

## 2016-11-07 MED ORDER — MAGNESIUM SULFATE IN D5W 1-5 GM/100ML-% IV SOLN
1.0000 g | Freq: Once | INTRAVENOUS | Status: AC
  Administered 2016-11-07: 21:00:00 1 g via INTRAVENOUS
  Filled 2016-11-07: qty 100

## 2016-11-07 MED ORDER — SODIUM CHLORIDE 0.9 % IV SOLN
INTRAVENOUS | Status: DC
  Administered 2016-11-07 – 2016-11-09 (×5): via INTRAVENOUS

## 2016-11-07 MED ORDER — MORPHINE SULFATE ER 15 MG PO TBCR
15.0000 mg | EXTENDED_RELEASE_TABLET | Freq: Two times a day (BID) | ORAL | Status: DC
  Administered 2016-11-07 (×2): 15 mg via ORAL
  Filled 2016-11-07 (×9): qty 1

## 2016-11-07 MED ORDER — PANTOPRAZOLE SODIUM 40 MG PO TBEC
40.0000 mg | DELAYED_RELEASE_TABLET | Freq: Every day | ORAL | Status: DC
  Administered 2016-11-07 – 2016-11-11 (×5): 40 mg via ORAL
  Filled 2016-11-07 (×5): qty 1

## 2016-11-07 MED ORDER — OXYCODONE HCL 5 MG PO TABS
10.0000 mg | ORAL_TABLET | ORAL | Status: DC | PRN
  Administered 2016-11-09: 10 mg via ORAL
  Filled 2016-11-07: qty 2

## 2016-11-07 MED ORDER — SODIUM CHLORIDE 0.9 % IV SOLN
30.0000 meq | Freq: Once | INTRAVENOUS | Status: AC
  Administered 2016-11-07: 30 meq via INTRAVENOUS
  Filled 2016-11-07: qty 15

## 2016-11-07 MED ORDER — ENSURE ENLIVE PO LIQD
237.0000 mL | Freq: Two times a day (BID) | ORAL | Status: DC
  Administered 2016-11-08: 237 mL via ORAL

## 2016-11-07 MED ORDER — SODIUM CHLORIDE 0.9 % IV BOLUS (SEPSIS)
1000.0000 mL | Freq: Once | INTRAVENOUS | Status: AC
  Administered 2016-11-07: 07:00:00 1000 mL via INTRAVENOUS

## 2016-11-07 MED ORDER — LEVOFLOXACIN IN D5W 500 MG/100ML IV SOLN
500.0000 mg | Freq: Once | INTRAVENOUS | Status: AC
  Administered 2016-11-07: 500 mg via INTRAVENOUS
  Filled 2016-11-07: qty 100

## 2016-11-07 MED ORDER — POTASSIUM CHLORIDE CRYS ER 20 MEQ PO TBCR
EXTENDED_RELEASE_TABLET | ORAL | Status: AC
  Administered 2016-11-07: 40 meq via ORAL
  Filled 2016-11-07: qty 2

## 2016-11-07 MED ORDER — POTASSIUM CHLORIDE CRYS ER 20 MEQ PO TBCR
40.0000 meq | EXTENDED_RELEASE_TABLET | Freq: Once | ORAL | Status: AC
  Administered 2016-11-07: 40 meq via ORAL

## 2016-11-07 MED ORDER — ONDANSETRON HCL 4 MG/2ML IJ SOLN: 4.0000 mg | Freq: Four times a day (QID) | INTRAMUSCULAR | Status: DC | PRN

## 2016-11-07 MED ORDER — FOLIC ACID 800 MCG PO TABS: 800.0000 ug | ORAL_TABLET | Freq: Every day | ORAL | Status: DC

## 2016-11-07 NOTE — H&P (Signed)
Audrey Mcgee is an 55 y.o. female.   Chief Complaint: Weakness HPI: The patient with past medical history of lung cancer and COPD presents to emergency department complaining of weakness. She was found to be very tachypneic but not hypoxic. Also, she states that she has not been able to eat for 4 days due to nausea and vomiting. She denies fevers but admits to coughing. Chest x-ray in the emergency department showed some opacification of the lower lobes that was not interpreted as consolidation, however the emergency department staff as well as the hospitalist service agreed that due to her immunocompromised and clinical presentation that pneumonia may be developing which prompted the hospitalist service to admit for IV antibiotic treatment.  Past Medical History:  Diagnosis Date  . COPD (chronic obstructive pulmonary disease) ([place])   . Hypertension   . Lung cancer ([place])   . Thyroid disease     Past Surgical History:  Procedure Laterality Date  . CESAREAN SECTION CLASSICAL    . STOMACH SURGERY     Pt reports for a tumor    Family History  Problem Relation Age of Onset  . Hypertension Father   . Cancer Father   . Diabetes Father   . Cancer Maternal Aunt    Social History:  reports that she has quit smoking. Her smoking use included Cigarettes. She quit after 15.00 years of use. She has never used smokeless tobacco. She reports that she does not drink alcohol or use drugs.  Allergies:  Allergies  Allergen Reactions  . No Known Allergies     Medications Prior to Admission  Medication Sig Dispense Refill  . benzonatate (TESSALON) 100 MG capsule Take 1 capsule (100 mg total) by mouth 3 (three) times daily as needed for cough. 20 capsule 0  . dexamethasone (DECADRON) 4 MG tablet Take 66m BID the day before and day after chemotherapy. (Patient taking differently: Take 4 mg by mouth 2 (two) times daily. Take 464mBID the day before and day after chemotherapy.) 20 tablet 0  .  docusate sodium (COLACE) 100 MG capsule Take 100 mg by mouth 2 (two) times daily as needed for mild constipation.    . folic acid (FOLVITE) 80161CG tablet Take 800 mcg by mouth daily.     . Marland Kitchenidocaine (XYLOCAINE) 2 % solution Use as directed 10 mLs in the mouth or throat every 4 (four) hours as needed for mouth pain.    . Marland Kitchenosartan (COZAAR) 100 MG tablet Take 1 tablet (100 mg total) by mouth daily. 30 tablet 0  . magic mouthwash w/lidocaine SOLN Take 5 mLs by mouth 4 (four) times daily as needed for mouth pain. 480 mL 1  . magnesium oxide (MAG-OX) 400 MG tablet Take 1 tablet (400 mg total) by mouth daily. 30 tablet 0  . megestrol (MEGACE) 40 MG/ML suspension Take 5 mLs (200 mg total) by mouth 2 (two) times daily. 300 mL 1  . methimazole (TAPAZOLE) 5 MG tablet take 1 tablet by mouth twice a day for OVERACTIVE THYROID  0  . metoprolol (LOPRESSOR) 50 MG tablet take 1 tablet by mouth twice a day NOTE DOSE INCREASE  0  . morphine (MS CONTIN) 15 MG 12 hr tablet Take 1 tablet (15 mg total) by mouth 2 (two) times daily. 30 tablet 0  . omeprazole (PRILOSEC) 20 MG capsule Take 1 capsule (20 mg total) by mouth daily. 30 capsule 3  . ondansetron (ZOFRAN) 8 MG tablet Take 1 tablet (8 mg total)  by mouth 2 (two) times daily as needed for nausea or vomiting. 30 tablet 1  . Oxycodone HCl 10 MG TABS Take 1 tablet (10 mg total) by mouth every 4 (four) hours as needed (pain). 30 tablet 0  . tiotropium (SPIRIVA) 18 MCG inhalation capsule Place 1 capsule (18 mcg total) into inhaler and inhale daily. 30 capsule 1    Results for orders placed or performed during the hospital encounter of 11/07/16 (from the past 48 hour(s))  Blood culture (routine x 2)     Status: None (Preliminary result)   Collection Time: 11/07/16  3:20 AM  Result Value Ref Range   Specimen Description BLOOD LEFT ARM    Special Requests      BOTTLES DRAWN AEROBIC AND ANAEROBIC  ANA 15ML AER 9ML   Culture NO GROWTH < 12 HOURS    Report Status PENDING    CBC     Status: Abnormal   Collection Time: 11/07/16  3:21 AM  Result Value Ref Range   WBC 1.8 (L) 3.6 - 11.0 K/uL   RBC 4.66 3.80 - 5.20 MIL/uL   Hemoglobin 15.7 12.0 - 16.0 g/dL   HCT 44.7 35.0 - 47.0 %   MCV 96.0 80.0 - 100.0 fL   MCH 33.6 26.0 - 34.0 pg   MCHC 35.0 32.0 - 36.0 g/dL   RDW 15.2 (H) 11.5 - 14.5 %   Platelets 145 (L) 150 - 440 K/uL  Comprehensive metabolic panel     Status: Abnormal   Collection Time: 11/07/16  3:21 AM  Result Value Ref Range   Sodium 130 (L) 135 - 145 mmol/L   Potassium 2.6 (LL) 3.5 - 5.1 mmol/L    Comment: CRITICAL RESULT CALLED TO, READ BACK BY AND VERIFIED WITH BRIAN GRAY ON 11/07/16 AT 0418 BY TLB    Chloride 91 (L) 101 - 111 mmol/L   CO2 25 22 - 32 mmol/L   Glucose, Bld 92 65 - 99 mg/dL   BUN 11 6 - 20 mg/dL   Creatinine, Ser 1.04 (H) 0.44 - 1.00 mg/dL   Calcium 8.5 (L) 8.9 - 10.3 mg/dL   Total Protein 7.5 6.5 - 8.1 g/dL   Albumin 3.5 3.5 - 5.0 g/dL   AST 29 15 - 41 U/L   ALT 11 (L) 14 - 54 U/L   Alkaline Phosphatase 39 38 - 126 U/L   Total Bilirubin 2.5 (H) 0.3 - 1.2 mg/dL   GFR calc non Af Amer >60 >60 mL/min   GFR calc Af Amer >60 >60 mL/min    Comment: (NOTE) The eGFR has been calculated using the CKD EPI equation. This calculation has not been validated in all clinical situations. eGFR's persistently <60 mL/min signify possible Chronic Kidney Disease.    Anion gap 14 5 - 15  Blood culture (routine x 2)     Status: None (Preliminary result)   Collection Time: 11/07/16  3:21 AM  Result Value Ref Range   Specimen Description BLOOD  LEFT AC    Special Requests      BOTTLES DRAWN AEROBIC AND ANAEROBIC  ANA 10ML AER 11ML   Culture NO GROWTH < 12 HOURS    Report Status PENDING   Lactic acid, plasma     Status: None   Collection Time: 11/07/16  3:21 AM  Result Value Ref Range   Lactic Acid, Venous 1.6 0.5 - 1.9 mmol/L   Dg Chest 2 View  Result Date: 11/07/2016 CLINICAL DATA:  Increasing dyspnea. Generalized weakness.  Recent chemotherapy for lung cancer. EXAM: CHEST  2 VIEW COMPARISON:  06/21/2016 FINDINGS: There is stable mild left hemidiaphragm elevation. The lungs are clear. There is no pleural effusion. Heart size is normal and unchanged. Pulmonary vasculature is normal. IMPRESSION: No acute findings. Electronically Signed   By: Andreas Newport M.D.   On: 11/07/2016 03:43    Review of Systems  Constitutional: Negative for chills and fever.  HENT: Negative for sore throat and tinnitus.   Eyes: Negative for blurred vision and redness.  Respiratory: Positive for cough and shortness of breath. Negative for sputum production.   Cardiovascular: Negative for chest pain, palpitations, orthopnea and PND.  Gastrointestinal: Positive for nausea and vomiting. Negative for abdominal pain and diarrhea.  Genitourinary: Negative for dysuria, frequency and urgency.  Musculoskeletal: Negative for joint pain and myalgias.  Skin: Negative for rash.       No lesions  Neurological: Positive for weakness. Negative for speech change and focal weakness.  Endo/Heme/Allergies: Does not bruise/bleed easily.       No temperature intolerance  Psychiatric/Behavioral: Negative for depression and suicidal ideas.    Blood pressure 118/81, pulse (!) 123, temperature 97.5 F (36.4 C), temperature source Oral, resp. rate (!) 32, weight 56.7 kg (125 lb), SpO2 96 %. Physical Exam  Vitals reviewed. Constitutional: She is oriented to person, place, and time. She appears well-developed and well-nourished. No distress.  HENT:  Head: Normocephalic and atraumatic.  Mouth/Throat: Oropharynx is clear and moist.  Eyes: Conjunctivae and EOM are normal. Pupils are equal, round, and reactive to light. No scleral icterus.  Neck: Normal range of motion. Neck supple. No JVD present. No tracheal deviation present. No thyromegaly present.  Cardiovascular: Normal rate, regular rhythm and normal heart sounds.  Exam reveals no gallop and no friction  rub.   No murmur heard. Respiratory: Effort normal and breath sounds normal.  GI: Soft. Bowel sounds are normal. She exhibits no distension. There is no tenderness.  Genitourinary:  Genitourinary Comments: Deferred  Musculoskeletal: Normal range of motion. She exhibits no edema.  Lymphadenopathy:    She has no cervical adenopathy.  Neurological: She is alert and oriented to person, place, and time. No cranial nerve deficit. She exhibits normal muscle tone.  Skin: Skin is warm and dry. No rash noted. No erythema.  Psychiatric: She has a normal mood and affect. Her behavior is normal. Judgment and thought content normal.     Assessment/Plan This is a 55 year old female admitted for sepsis secondary to pneumonia. 1. Sepsis: The patient meets criteria via tachycardia, tachypnea and leukopenia. We have started broad spectrum antibiotics and will follow blood cultures for growth and sensitivities. She is hemodynamically stable. 2. Pneumonia: Hospital-acquired: We have started the patient on Levaquin. I have added vancomycin. Follow MRSA PCR; discontinue Vanco if negative. Supple mental oxygen as needed. 3. Hypertension: Continue ARB and metoprolol 4. Hyperthyroidism: Continue methimazole 5. COPD: Continue Spiriva 6. Lung cancer: With associated leukopenia; neutropenic precautions 7. Hypokalemia: Replete potassium 8. DVT prophylaxis: Lovenox 9. GI prophylaxis: Pantoprazole per home regimen The patient is a full code. Time spent on admission orders and patient care approximately 45 minutes  Harrie Foreman, MD 11/07/2016, 7:37 AM

## 2016-11-07 NOTE — Progress Notes (Signed)
Pharmacy Antibiotic Note  Audrey Mcgee is a 55 y.o. female admitted on 11/07/2016 with pneumonia.  Pharmacy has been consulted for vancomycin and Levaquin dosing.  Plan: DW 56.7 kg  Vd 40L kei 0.45  T1/2 15 hours Vancomycin 750 mg q 18 hours ordered with stacked dosing. Level before 5th dose. Goal trough 15-20.  Levaquin 750 mg q 48 hours ordered  Weight: 125 lb (56.7 kg)  Temp (24hrs), Avg:97.5 F (36.4 C), Min:97.5 F (36.4 C), Max:97.5 F (36.4 C)   Recent Labs Lab 10/31/16 1310 11/07/16 0321  WBC 8.0 1.8*  CREATININE 1.11* 1.04*  LATICACIDVEN  --  1.6    Estimated Creatinine Clearance: 49.5 mL/min (by C-G formula based on SCr of 1.04 mg/dL (H)).    Allergies  Allergen Reactions  . No Known Allergies     Antimicrobials this admission: vancomycin 11/17 >>  Levaquin 11/17 >>   Dose adjustments this admission:   Microbiology results: 11/17 BCx: pending 11/17 MRSA PCR: pending    11/17 UA: pending   Thank you for allowing pharmacy to be a part of this patient's care.  Adiel Mcnamara S 11/07/2016 7:02 AM

## 2016-11-07 NOTE — Progress Notes (Signed)
Initial Nutrition Assessment  DOCUMENTATION CODES:   Severe malnutrition in context of chronic illness  INTERVENTION:  Encouraged adequate intake of calories and protein through meals, snacks, and beverages.  Provide milkshakes from [employer] mixed with ice cream BID. Each Ensure Enlive provides 350 kcal and 20 grams protein. Will order through [facility] to be brought up to floor for patient already mixed with ice cream.  When appropriate will provide education for nausea and taste changes. Patient not feeling well enough for education at this time.  NUTRITION DIAGNOSIS:   Inadequate oral intake related to cancer and cancer related treatments, poor appetite, nausea, other (see comment) (abdominal pain, taste changes) as evidenced by per patient/family report, other (see comment) (no intake of food/beverages for 4-5 days).  GOAL:   Patient will meet greater than or equal to 90% of their needs  MONITOR:   PO intake, Supplement acceptance, Labs, Weight trends, I & O's  REASON FOR ASSESSMENT:   Malnutrition Screening Tool    ASSESSMENT:   55 year old female with past medical history of lung cancer and COPD presents to emergency department complaining of weakness. Found to have sepsis secondary to pneumonia.    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 She received palliative radiation to the left lung from 10/24/2014-11/07/2014. She received 6 cycles carboplatin and Alimta from 11/14/2014-04/30/2015. She has received 5 cycles of maintenance Alimta and started 6th cycle 10/31/2016. She receives B12 every 9 weeks (last 10/24/2016) and is on daily folic acid.  Spoke with patient at bedside. She reports appetite has been very poor for the past 4-5 days and she has not been able to tolerate any food or drinks. She is experiencing nausea, abdominal pain, and taste changes. Also reporting difficulty chewing/swallowing - she feels like she is  choking on food occasionally. Also endorses constipation. Patient reports that before this happened she was eating 2 meals per day (usually chicken or other meat with sides).  Reports UBW 127 lbs and that she has been losing some weight over the past 5 days. However, per chart patient has been gaining back weight gradually since lowest recent weight of 117 lbs on 8/11. That weight loss of 8 lbs (6% body weight) occurred over 1 month (7/7-8/11) which is significant for time frame.  Medications reviewed and include: Colace, folic acid 1 mg daily, magnesium oxide 400 mg daily, Megace 200 mg BID, Zofran 4 mg IV once today (pt refused), morphine 2 mg IV once today (pt refused), potassium chloride 30 mEq once today, NS @ 100 ml/hr.  Labs reviewed: Sodium 130, Potassium 2.6, Chloride 91, Creatinine 1.04.  Nutrition-Focused physical exam completed. Findings are severe fat depletion, severe muscle depletion, and no edema.  Discussed with RN.   Diet Order:  Diet regular Room service appropriate? Yes; Fluid consistency: Thin  Skin:  Reviewed, no issues  Last BM:  11/03/2016  Height:   Ht Readings from Last 1 Encounters:  11/07/16 '5\' 2"'$  (1.575 m)    Weight:   Wt Readings from Last 1 Encounters:  11/07/16 125 lb 7 oz (56.9 kg)    Ideal Body Weight:  50 kg  BMI:  Body mass index is 22.94 kg/m.  Estimated Nutritional Needs:   Kcal:  1700-2000 (30-35 kcal/kg)  Protein:  85-100 grams (1.5-1.7 grams/kg)  Fluid:  >/= 1.7 L/day (30 ml/kg)  EDUCATION NEEDS:   Education needs no appropriate at this time  Willey Blade, MS, RD, LDN Pager: [phone] After Hours  Pager: [phone]  

## 2016-11-07 NOTE — ED Triage Notes (Signed)
Pt has lung CA and had chemo on Monday and has had increased and shortness of breath. Has not been able to eat and has had generalized weakness.

## 2016-11-07 NOTE — Progress Notes (Addendum)
Patient ID: Audrey Mcgee, female   DOB: 01/04/1961, 55 y.o.   MRN: [id number]   [facility] [place] NAME: Audrey Mcgee    MR#:  [id number]  DATE OF BIRTH:  10-15-1961  SUBJECTIVE:  CHIEF COMPLAINT:   Chief Complaint  Patient presents with  . Weakness  And examined at bedside. Chart reviewed and care assumed.  REVIEW OF SYSTEMS:  ROS  Constitutional: Negative for chills and fever.  HENT: Negative for sore throat and tinnitus.   Eyes: Negative for blurred vision and redness.  Respiratory: Positive for shortness of breath. Negative for sputum production.   Cardiovascular: Negative for chest pain, palpitations, orthopnea and PND.  Gastrointestinal: Negative for abdominal pain and diarrhea.  Genitourinary: Negative for dysuria, frequency and urgency.  Musculoskeletal: Negative for joint pain and myalgias.  Skin: Negative for rash.       No lesions  Neurological: Positive for weakness. Negative for speech change and focal weakness.  Endo/Heme/Allergies: Does not bruise/bleed easily.       No temperature intolerance  Psychiatric/Behavioral: Negative for depression and suicidal ideas.    DRUG ALLERGIES:   Allergies  Allergen Reactions  . No Known Allergies    VITALS:  Blood pressure 93/77, pulse 88, temperature 98.2 F (36.8 C), temperature source Oral, resp. rate 20, height '5\' 2"'$  (1.575 m), weight 56.9 kg (125 lb 7 oz), SpO2 95 %. PHYSICAL EXAMINATION:  Physical Exam  Constitutional: She is oriented to person, place, and time. She appears well-developed and well-nourished. No distress.  HENT:  Head: Normocephalic and atraumatic.  Mouth/Throat: Oropharynx is clear and moist.  Eyes: Conjunctivae and EOM are normal. Pupils are equal, round, and reactive to light. No scleral icterus.  Neck: Normal range of motion. Neck supple. No JVD present. No tracheal deviation present. No thyromegaly present.  Cardiovascular: Normal  rate, regular rhythm and normal heart sounds.  Exam reveals no gallop and no friction rub.   No murmur heard. Respiratory: Effort normal and breath sounds normal.  GI: Soft. Bowel sounds are normal. She exhibits no distension. There is no tenderness.  Musculoskeletal: Normal range of motion. She exhibits no edema.  Lymphadenopathy:    She has no cervical adenopathy.  Neurological: She is alert and oriented to person, place, and time. No cranial nerve deficit. She exhibits normal muscle tone.  Skin: Skin is warm and dry. No rash noted. No erythema.  Psychiatric: She has a normal mood and affect. Her behavior is normal. Judgment and thought content normal.     LABORATORY PANEL:   CBC  Recent Labs Lab 11/07/16 0321  WBC 1.8*  HGB 15.7  HCT 44.7  PLT 145*   ------------------------------------------------------------------------------------------------------------------ Chemistries   Recent Labs Lab 11/07/16 0321  NA 130*  K 2.6*  CL 91*  CO2 25  GLUCOSE 92  BUN 11  CREATININE 1.04*  CALCIUM 8.5*  AST 29  ALT 11*  ALKPHOS 39  BILITOT 2.5*   RADIOLOGY:  Dg Chest 2 View  Result Date: 11/07/2016 CLINICAL DATA:  Increasing dyspnea. Generalized weakness. Recent chemotherapy for lung cancer. EXAM: CHEST  2 VIEW COMPARISON:  06/21/2016 FINDINGS: There is stable mild left hemidiaphragm elevation. The lungs are clear. There is no pleural effusion. Heart size is normal and unchanged. Pulmonary vasculature is normal. IMPRESSION: No acute findings. Electronically Signed   By: Andreas Newport M.D.   On: 11/07/2016 03:43   ASSESSMENT AND PLAN:   This is a 55 year old female admitted  for sepsis secondary to pneumonia. 1. Sepsis secondary to HCAP: Levaquin, IV fluids, O2 and nebs when necessary.  2. Hypokalemia - replace IV. 3. Swallowing difficulty, chronic - speech eval.  4. Hypertension: Continue ARB and metoprolol 5. Hyperthyroidism: Continue methimazole.  TSH 33 - will need  outpatient endocrine.  6. COPD: Continue Spiriva 7. Lung cancer: With associated leukopenia; neutropenic precautions 8. Hypokalemia: Replete potassium 9. DVT prophylaxis: Lovenox 10. GI prophylaxis: Pantoprazole per home regimen  Full Code   TOTAL TIME TAKING CARE OF THIS PATIENT: 30 minutes.   More than 50% of the time was spent in counseling/coordination of care: YES  POSSIBLE D/C IN 2-3 DAYS, DEPENDING ON CLINICAL CONDITION.   Admire Bunnell D.O. on 11/07/2016 at 3:51 PM  Between 7am to 6pm - Pager - [phone]  After 6pm go to [email or web address] - Proofreader  [facility]  Office  [phone]  CC: Primary care physician; Donnie Coffin, MD  Note: This dictation was prepared with Dragon dictation along with smaller phrase technology. Any transcriptional errors that result from this process are unintentional.

## 2016-11-07 NOTE — Progress Notes (Signed)
Pharmacy Antibiotic Note  Audrey Mcgee is a 55 y.o. female admitted on 11/07/2016 with pneumonia.  Pharmacy has been consulted for vancomycin and Levaquin dosing.  Plan: After discussion with Dr. Ara Kussmaul, MRSA PCR is negative and will d/c vancomycin.   Levaquin 750 mg q 48 hours ordered  Height: '5\' 2"'$  (157.5 cm) Weight: 125 lb 7 oz (56.9 kg) IBW/kg (Calculated) : 50.1  Temp (24hrs), Avg:98 F (36.7 C), Min:97.5 F (36.4 C), Max:98.3 F (36.8 C)   Recent Labs Lab 11/07/16 0321  WBC 1.8*  CREATININE 1.04*  LATICACIDVEN 1.6    Estimated Creatinine Clearance: 49.5 mL/min (by C-G formula based on SCr of 1.04 mg/dL (H)).    Allergies  Allergen Reactions  . No Known Allergies     Antimicrobials this admission: vancomycin 11/17 >>  Levaquin 11/17 >>   Dose adjustments this admission:   Microbiology results: 11/17 BCx: NGTD 11/17 MRSA PCR: negative  Thank you for allowing pharmacy to be a part of this patient's care.  Ulice Dash D 11/07/2016 2:57 PM

## 2016-11-08 LAB — BASIC METABOLIC PANEL
Anion gap: 7 (ref 5–15)
BUN: 6 mg/dL (ref 6–20)
CALCIUM: 7.5 mg/dL — AB (ref 8.9–10.3)
CO2: 24 mmol/L (ref 22–32)
Chloride: 103 mmol/L (ref 101–111)
Creatinine, Ser: 0.8 mg/dL (ref 0.44–1.00)
GFR calc Af Amer: >60 mL/min (ref >60)
Glucose, Bld: 95 mg/dL (ref 65–99)
POTASSIUM: 3.3 mmol/L — AB (ref 3.5–5.1)
SODIUM: 134 mmol/L — AB (ref 135–145)

## 2016-11-08 LAB — CBC
HEMATOCRIT: 36.4 % (ref 35.0–47.0)
Hemoglobin: 12.7 g/dL (ref 12.0–16.0)
MCH: 34.1 pg — ABNORMAL HIGH (ref 26.0–34.0)
MCHC: 35 g/dL (ref 32.0–36.0)
MCV: 97.5 fL (ref 80.0–100.0)
Platelets: 109 10*3/uL — ABNORMAL LOW (ref 150–440)
RBC: 3.73 MIL/uL — ABNORMAL LOW (ref 3.80–5.20)
RDW: 14.9 % — AB (ref 11.5–14.5)
WBC: 2.5 10*3/uL — AB (ref 3.6–11.0)

## 2016-11-08 MED ORDER — LEVOFLOXACIN IN D5W 750 MG/150ML IV SOLN
750.0000 mg | INTRAVENOUS | Status: DC
  Administered 2016-11-08 – 2016-11-10 (×3): 750 mg via INTRAVENOUS
  Filled 2016-11-08 (×3): qty 150

## 2016-11-08 MED ORDER — SODIUM CHLORIDE 0.9 % IV SOLN
30.0000 meq | Freq: Once | INTRAVENOUS | Status: AC
  Administered 2016-11-08: 13:00:00 30 meq via INTRAVENOUS
  Filled 2016-11-08: qty 15

## 2016-11-08 NOTE — Progress Notes (Signed)
Patient ID: Audrey Mcgee, female   DOB: 11-22-1961, 55 y.o.   MRN: [id number]   [facility] [place] NAME: Audrey Mcgee    MR#:  [id number]  DATE OF BIRTH:  04/29/1961  SUBJECTIVE:  CHIEF COMPLAINT:   Chief Complaint  Patient presents with  . Weakness  Patient seen and examined at bedside. Feeling significantly improved today. Shortness of breath and weakness are improved. He has been eating and drinking, has urine and stool output.  REVIEW OF SYSTEMS:  ROS. Constitutional: Negative for chillsand fever.  HENT: Negative for sore throatand tinnitus.  Eyes: Negative for blurred visionand redness.  Respiratory: Positive for dyspnea on exertion. Negative for sputum production.  Cardiovascular: Negative for chest pain, palpitations, orthopneaand PND.  Gastrointestinal: Negative for abdominal painand diarrhea.  Genitourinary: Negative for dysuria, frequencyand urgency.  Musculoskeletal: Negative for joint painand myalgias.  Skin: Negative for rash.  Neurological: Negative weakness. Negative for speech changeand focal weakness.  Endo/Heme/Allergies: Does not bruise/bleed easily.  Psychiatric/Behavioral: Negative for depressionand suicidal ideas.   DRUG ALLERGIES:   Allergies  Allergen Reactions  . No Known Allergies    VITALS:  Blood pressure 102/75, pulse (!) 116, temperature 98.3 F (36.8 C), temperature source Oral, resp. rate 20, height '5\' 2"'$  (1.575 m), weight 61.1 kg (134 lb 12.8 oz), SpO2 91 %. PHYSICAL EXAMINATION:  Physical Exam  Constitutional: She is oriented to person, place, and time. She appears well-developedand well-nourished. No distress.  HENT:  Head: Normocephalicand atraumatic.  Mouth/Throat: Oropharynx is clear and moist.  Eyes: Conjunctivaeand EOMare normal. Pupils are equal, round, and reactive to light. No scleral icterus. Exophthlamos. Neck: Normal range of motion. Neck supple. No  JVDpresent. No tracheal deviationpresent. No thyromegalypresent.  Cardiovascular: Normal rate, regular rhythmand normal heart sounds. Exam reveals no gallopand no friction rub.  No murmurheard. Respiratory: Effort normaland breath sounds normal.  GI: Soft. Bowel sounds are normal. She exhibits no distension. There is no tenderness.  Musculoskeletal: Normal range of motion. She exhibits no edema.  Lymphadenopathy:  She has no cervical adenopathy.  Neurological: She is alertand oriented to person, place, and time. No cranial nerve deficit. She exhibits normal muscle tone.  Skin: Skin is warmand dry. No rashnoted. No erythema.  Psychiatric: She has a normal mood and affect. Her behavior is normal. Judgmentand thought contentnormal.  LABORATORY PANEL:   CBC  Recent Labs Lab 11/08/16 0434  WBC 2.5*  HGB 12.7  HCT 36.4  PLT 109*   ------------------------------------------------------------------------------------------------------------------ Chemistries   Recent Labs Lab 11/07/16 0321 11/07/16 1810 11/08/16 0434  NA 130* 133* 134*  K 2.6* 3.6 3.3*  CL 91* 100* 103  CO2 '25 25 24  '$ GLUCOSE 92 94 95  BUN '11 8 6  '$ CREATININE 1.04* 0.89 0.80  CALCIUM 8.5* 7.3* 7.5*  MG  --  1.5*  --   AST 29  --   --   ALT 11*  --   --   ALKPHOS 39  --   --   BILITOT 2.5*  --   --    RADIOLOGY:  No results found. ASSESSMENT AND PLAN:  This is a 55 year old female admitted for sepsis secondary to pneumonia.  1. Sepsis secondary to HCAP, improving slowly: Levaquin, IV fluids, O2 and nebs when necessary.  2. Hypokalemia - replace IV. 3. Hypomagnesemia - replace IV.  3. Swallowing difficulty, chronic - speech eval.  4. Hypertension: Continue ARB and metoprolol 5. Hyperthyroidism: Continue methimazole.  TSH 33 -  will need repeat once medically stable and outpatient endocrine followup.  6. COPD: Continue Spiriva 7. Lung cancer: With associated leukopenia; neutropenic  precautions 8. Hypokalemia: Replete potassium 9. DVT prophylaxis: Lovenox 10. GI prophylaxis: Pantoprazole per home regimen  PT eval for discharge planning.   Full Code           TOTAL TIME TAKING CARE OF THIS PATIENT: 30 minutes.     All the records are reviewed and case discussed with Care Management/Social Worker. Management plans discussed with the patient, family and they are in agreement.  More than 50% of the time was spent in counseling/coordination of care: YES  POSSIBLE D/C IN 1-2 DAYS, DEPENDING ON CLINICAL CONDITION.   Christofer Shen D.O. on 11/08/2016 at 11:15 AM  Between 7am to 6pm - Pager - [phone]  After 6pm go to [email or web address] - Proofreader  [facility]  Office  [phone]  CC: Primary care physician; Donnie Coffin, MD  Note: This dictation was prepared with Dragon dictation along with smaller phrase technology. Any transcriptional errors that result from this process are unintentional.

## 2016-11-08 NOTE — Evaluation (Signed)
Clinical/Bedside Swallow Evaluation Patient Details  Name: Audrey Mcgee MRN: [id number] Date of Birth: 08-21-1961  Today's Date: 11/08/2016 Time: SLP Start Time (ACUTE ONLY): 1100 SLP Stop Time (ACUTE ONLY): 1130 SLP Time Calculation (min) (ACUTE ONLY): 30 min  Past Medical History:  Past Medical History:  Diagnosis Date  . COPD (chronic obstructive pulmonary disease) ([place])   . Hypertension   . Lung cancer ([place])   . Thyroid disease    Past Surgical History:  Past Surgical History:  Procedure Laterality Date  . CESAREAN SECTION CLASSICAL    . STOMACH SURGERY     Pt reports for a tumor   HPI:  The patient with past medical history of lung cancer and COPD presents to emergency department complaining of weakness. She was found to be very tachypneic but not hypoxic. Also, she states that she has not been able to eat for 4 days due to nausea and vomiting. She denies fevers but admits to coughing. Chest x-ray in the emergency department showed some opacification of the lower lobes that was not interpreted as consolidation, however the emergency department staff as well as the hospitalist service agreed that due to her immunocompromised and clinical presentation that pneumonia may be developing which prompted the hospitalist service to admit for IV antibiotic treatment. Pt reports that she is not able to swallow pills well and that they get stuck. Pt reports she has had difficulty since most recent chemo treatment.   Assessment / Plan / Recommendation Clinical Impression  Pt presents w/oropharyngeal swallow mostly WFL. Pt only willing to take a few bites and sips, d/t nausea. Pt reports her largest concern is a difficulty taking pills and denies any other swallowing difficulties. Nsg reports no s/s of aspiration have been noted with pt. Pt demonstrated no overt s/s of aspiration with any tested consistency, although very limited quantities consumed. Cough noted following sip of water via  large straw when clearing solid. Unsure if related to PO intake and pt refused to take further sips. Vocal quality remained clear throughout trials. Pt demonstrated min difficulty clearing dry solid from oral cavity. Discussed possibly downgrading diet to Dysphagia III to ease mastication and to add extra moisture to food, however pt refused diet recommendation, insisting that she only had difficulty w/pills. At this point recommend continue w/current diet and cater to pt preferences with pills whole in puree or thickened juice. Will f/u re: toleration of diet in 1-2 days.     Aspiration Risk  Mild aspiration risk    Diet Recommendation Regular;Thin liquid   Liquid Administration via: Cup;Straw Medication Administration: Whole meds with puree Supervision: Patient able to self feed Compensations: Minimize environmental distractions;Slow rate;Small sips/bites;Follow solids with liquid Postural Changes: Seated upright at 90 degrees    Other  Recommendations Oral Care Recommendations: Oral care BID;Patient independent with oral care   Follow up Recommendations  (TBD)      Frequency and Duration min 2x/week  1 week       Prognosis Prognosis for Safe Diet Advancement: Good      Swallow Study   General Date of Onset: 11/08/16 HPI: The patient with past medical history of lung cancer and COPD presents to emergency department complaining of weakness. She was found to be very tachypneic but not hypoxic. Also, she states that she has not been able to eat for 4 days due to nausea and vomiting. She denies fevers but admits to coughing. Chest x-ray in the emergency department showed some opacification of the  lower lobes that was not interpreted as consolidation, however the emergency department staff as well as the hospitalist service agreed that due to her immunocompromised and clinical presentation that pneumonia may be developing which prompted the hospitalist service to admit for IV antibiotic  treatment. Pt reports that she is not able to swallow pills well and that they get stuck. Pt reports she has had difficulty since most recent chemo treatment. Type of Study: Bedside Swallow Evaluation Previous Swallow Assessment: none Diet Prior to this Study: Regular;Thin liquids Temperature Spikes Noted: No Respiratory Status: Room air History of Recent Intubation: No Behavior/Cognition: Alert;Uncooperative Oral Cavity Assessment: Within Functional Limits Oral Care Completed by SLP: No Oral Cavity - Dentition: Adequate natural dentition Vision: Functional for self-feeding Self-Feeding Abilities: Able to feed self Patient Positioning: Upright in bed Baseline Vocal Quality: Hoarse Volitional Cough: Strong Volitional Swallow: Able to elicit    Oral/Motor/Sensory Function Overall Oral Motor/Sensory Function: Other (comment) (Grossly WFL however most movements incomplete)   Ice Chips Ice chips: Within functional limits Presentation: Spoon Other Comments: 2 trials of ice chips   Thin Liquid Thin Liquid: Within functional limits (Grossly assessed) Presentation: Straw;Self Fed;Spoon Other Comments: Pt only willing to take one sip of thin by straw and two tsps (melted ice cream). Noted delayed cough following sip of thin by cough, unable to further assess, d/t pt refusal. No overt s/s of aspiration with tsps of thin    Nectar Thick Nectar Thick Liquid: Not tested   Honey Thick Honey Thick Liquid: Not tested   Puree Other Comments: Pt refused tsps of puree. Pt did take a couple tsps of ice cream but it had melted to thin consistency   Solid   GO   Solid: Within functional limits Presentation: Self Fed Other Comments: Pt took one small bite of solid with encouragement. Noted min difficulty with dry consistency.         [place],Maaran 11/08/2016,12:06 PM

## 2016-11-08 NOTE — Evaluation (Signed)
Physical Therapy Evaluation Patient Details Name: Audrey Sterne MRN: [id number] DOB: [birth year] Today's Date: 11/08/2016   History of Present Illness  The patient with past medical history of lung cancer and COPD presents to emergency department complaining of weakness. She was found to be very tachypneic but not hypoxic. Also, she states that she has not been able to eat for 4 days due to nausea and vomiting. She denies fevers but admits to coughing. Chest x-ray in the emergency department showed some opacification of the lower lobes that was not interpreted as consolidation, however the emergency department staff as well as the hospitalist service agreed that due to her immunocompromised and clinical presentation that pneumonia may be developing which prompted the hospitalist service to admit for IV antibiotic treatment.  Clinical Impression  Pt admitted with above diagnosis. Pt currently with functional limitations due to the deficits listed below (see PT Problem List).  Pt is very limited at this time with physical therapy. SaO2 at 89-90% on room air at rest and drops to 85% with bed mobility. Unable to recover until pt lays back down and even then requires 2-3 minutes to recover to 93%. Pt is very fatigued today and refuses transfers or ambulation. Family present for evaluation and everyone in agreement that pt is unsafe to return home in current condition and would need SNF placement. Pt is still considering and unsure if she will agree. Will need out of bed assessment as her endurance and breathing improve. If pt declines SNF please arrange [facility] PT and get patient a rolling walker. She will also need 24/7 assist if she decides to return home instead of SNF. Pt will benefit from skilled PT services to address deficits in strength, balance, and mobility in order to return to full function at home.     Follow Up Recommendations SNF;Other (comment) (If pt refuses please arrange 24/7 assist and [facility]  PT)    Equipment Recommendations  Rolling walker with 5" wheels;Other (comment) (Will likely need RW, TBD at [facility])    Recommendations for Other Services Rehab consult     Precautions / Restrictions Precautions Precautions: Fall Restrictions Weight Bearing Restrictions: No      Mobility  Bed Mobility Overal bed mobility: Needs Assistance Bed Mobility: Supine to Sit;Sit to Supine     Supine to sit: Min assist Sit to supine: Min assist   General bed mobility comments: Pt moves very slowly and becomes exceedingly short winded during very limited bed mobility. HOB elevated and bed rails utilized. Once upright at EOB pt with SOB. SaO2 drops to 85% on room air with bed mobility. Does not recover with pursed lip breathing in sitting. Pt refusing transfers or ambulation at this time due to feeling unwell and SOB. Requests return to bed. Once back in bed SaO2 recovers to 93% after 2-3 minutes. RN notified who placed pt on 2L/min O2 due to persistent complains of shortness of breath  Transfers                 General transfer comment: Pt refuses at this time  Ambulation/Gait             General Gait Details: Pt refuses at this time  Stairs            Wheelchair Mobility    Modified Rankin (Stroke Patients Only)       Balance Overall balance assessment: Needs assistance Sitting-balance support: No upper extremity supported Sitting balance-Leahy Scale: Fair  Standing balance comment: Unable to assess                             Pertinent Vitals/Pain Pain Assessment: 0-10 Pain Score: 7  Pain Location: L shoulder, headache as well. Initially reports no pain at rest Pain Intervention(s): Premedicated before session;Monitored during session    [place] expects to be discharged to:: Private residence Living Arrangements: Spouse/significant other;Children Available Help at Discharge: Family Type of Home: House Home  Access: Stairs to enter Entrance Stairs-Rails: Left Entrance Stairs-Number of Steps: 2 Home Layout: One level Home Equipment: None Additional Comments: No rolling walker or cane    Prior Function Level of Independence: Needs assistance   Gait / Transfers Assistance Needed: Pt reports ambulation at baseline without assistive device. Reports 2 falls in the last 12 months due to LE buckling/collapse  ADL's / Homemaking Assistance Needed: Independent with ADLs, family assists with IADLs.         Hand Dominance   Dominant Hand: Left    Extremity/Trunk Assessment   Upper Extremity Assessment: Generalized weakness;LUE deficits/detail       LUE Deficits / Details: L shoulder pain with strength testing. UE grossly 4- to 4/5 throughout without focal weakness   Lower Extremity Assessment: Generalized weakness;RLE deficits/detail RLE Deficits / Details: 3+/5 bilateral hip flexion, 4-/5 bilateral knee flexion/extension and ankle dorsiflexion. Denies UE/LE numbness/tingling       Communication   Communication: Other (comment) (Difficult to understand at time, muffled voice)  Cognition Arousal/Alertness: Awake/alert Behavior During Therapy: Flat affect Overall Cognitive Status: Within Functional Limits for tasks assessed                      General Comments      Exercises     Assessment/Plan    PT Assessment Patient needs continued PT services  PT Problem List Decreased strength;Decreased activity tolerance;Decreased balance;Decreased mobility;Decreased knowledge of use of DME;Decreased safety awareness;Cardiopulmonary status limiting activity          PT Treatment Interventions DME instruction;Gait training;Stair training;Therapeutic activities;Balance training;Therapeutic exercise;Neuromuscular re-education;Patient/family education    PT Goals (Current goals can be found in the Care Plan section)  Acute Rehab PT Goals Patient Stated Goal: Return to prior level  of function PT Goal Formulation: With patient/family Time For Goal Achievement: 11/22/16 Potential to Achieve Goals: Fair    Frequency Min 2X/week   Barriers to discharge Inaccessible home environment Steps to enter, currently too weak and SOB to perform    Co-evaluation               End of Session   Activity Tolerance: Patient limited by fatigue Patient left: in bed;with bed alarm set;with call bell/phone within reach Nurse Communication: Mobility status;Other (comment) (Dyspnea, SaO2 readings)         Time: 1352-1405 PT Time Calculation (min) (ACUTE ONLY): 13 min   Charges:   PT Evaluation $PT Eval Low Complexity: 1 Procedure     PT G Codes:       Lyndel Safe Huprich PT, DPT   Huprich,Jason 11/08/2016, 2:15 PM

## 2016-11-08 NOTE — Progress Notes (Signed)
Pharmacy Antibiotic Note  Audrey Mcgee is a 55 y.o. female admitted on 11/07/2016 with pneumonia.  Pharmacy has been consulted for vancomycin and Levaquin dosing.  Plan: After discussion with Dr. Ara Kussmaul, MRSA PCR is negative and will d/c vancomycin.   Levaquin 750 mg q 48 hours ordered  Height: '5\' 2"'$  (157.5 cm) Weight: 125 lb 7 oz (56.9 kg) IBW/kg (Calculated) : 50.1  Temp (24hrs), Avg:98.3 F (36.8 C), Min:98.2 F (36.8 C), Max:98.5 F (36.9 C)   Recent Labs Lab 11/07/16 0321 11/07/16 1810  WBC 1.8*  --   CREATININE 1.04* 0.89  LATICACIDVEN 1.6  --     Estimated Creatinine Clearance: 57.8 mL/min (by C-G formula based on SCr of 0.89 mg/dL).    Allergies  Allergen Reactions  . No Known Allergies     Antimicrobials this admission: vancomycin 11/17 >>  Levaquin 11/17 >>   Dose adjustments this admission: Levaquin changed to q 24 hours for improving renal function.   Microbiology results: 11/17 BCx: NGTD 11/17 MRSA PCR: negative  Thank you for allowing pharmacy to be a part of this patient's care.  Absalom Aro S 11/08/2016 4:04 AM

## 2016-11-09 ENCOUNTER — Inpatient Hospital Stay: Payer: Medicaid Other

## 2016-11-09 LAB — BASIC METABOLIC PANEL
Anion gap: 6 (ref 5–15)
CALCIUM: 7.2 mg/dL — AB (ref 8.9–10.3)
CHLORIDE: 106 mmol/L (ref 101–111)
CO2: 23 mmol/L (ref 22–32)
CREATININE: 0.75 mg/dL (ref 0.44–1.00)
GFR calc Af Amer: >60 mL/min (ref >60)
GFR calc non Af Amer: >60 mL/min (ref >60)
Glucose, Bld: 62 mg/dL — ABNORMAL LOW (ref 65–99)
Potassium: 3.6 mmol/L (ref 3.5–5.1)
SODIUM: 135 mmol/L (ref 135–145)

## 2016-11-09 LAB — CBC WITH DIFFERENTIAL/PLATELET
BASOS PCT: 0 %
Basophils Absolute: 0 10*3/uL (ref 0–0.1)
EOS ABS: 0 10*3/uL (ref 0–0.7)
EOS PCT: 0 %
HCT: 33.3 % — ABNORMAL LOW (ref 35.0–47.0)
Hemoglobin: 11.5 g/dL — ABNORMAL LOW (ref 12.0–16.0)
LYMPHS ABS: 0.9 10*3/uL — AB (ref 1.0–3.6)
Lymphocytes Relative: 19 %
MCH: 33.9 pg (ref 26.0–34.0)
MCHC: 34.6 g/dL (ref 32.0–36.0)
MCV: 97.8 fL (ref 80.0–100.0)
MONOS PCT: 12 %
Monocytes Absolute: 0.6 10*3/uL (ref 0.2–0.9)
Neutro Abs: 3.2 10*3/uL (ref 1.4–6.5)
Neutrophils Relative %: 69 %
PLATELETS: 116 10*3/uL — AB (ref 150–440)
RBC: 3.41 MIL/uL — AB (ref 3.80–5.20)
RDW: 15 % — ABNORMAL HIGH (ref 11.5–14.5)
WBC: 4.7 10*3/uL (ref 3.6–11.0)

## 2016-11-09 LAB — MAGNESIUM: MAGNESIUM: 1.9 mg/dL (ref 1.7–2.4)

## 2016-11-09 MED ORDER — DM-GUAIFENESIN ER 30-600 MG PO TB12
1.0000 | ORAL_TABLET | Freq: Two times a day (BID) | ORAL | Status: DC
  Filled 2016-11-09: qty 1

## 2016-11-09 MED ORDER — GUAIFENESIN ER 600 MG PO TB12
600.0000 mg | ORAL_TABLET | Freq: Two times a day (BID) | ORAL | Status: DC
  Administered 2016-11-09 – 2016-11-11 (×5): 600 mg via ORAL
  Filled 2016-11-09 (×5): qty 1

## 2016-11-09 MED ORDER — DEXTROMETHORPHAN POLISTIREX ER 30 MG/5ML PO SUER
30.0000 mg | Freq: Two times a day (BID) | ORAL | Status: DC
  Administered 2016-11-09 – 2016-11-11 (×5): 30 mg via ORAL
  Filled 2016-11-09 (×6): qty 5

## 2016-11-09 MED ORDER — METHYLPREDNISOLONE SODIUM SUCC 125 MG IJ SOLR
60.0000 mg | Freq: Four times a day (QID) | INTRAMUSCULAR | Status: DC
  Administered 2016-11-09 – 2016-11-10 (×5): 60 mg via INTRAVENOUS
  Filled 2016-11-09 (×5): qty 2

## 2016-11-09 NOTE — Progress Notes (Signed)
Patient ID: Audrey Mcgee, female   DOB: May 12, 1961, 55 y.o.   MRN: [id number]   [place] at [place] NAME: Audrey Mcgee    MR#:  [id number]  DATE OF BIRTH:  [birth year]  SUBJECTIVE:  CHIEF COMPLAINT:   Chief Complaint  Patient presents with  . Weakness  Patient seen and examined at bedside. Reports that she feels about the same as she did yesterday. Her shortness of breath persists with some dyspnea on exertion but her weakness is improved. She has been eating and drinking, has urine and stool output. Her that her cough is nonproductive and has been unable to provide a sputum sample  REVIEW OF SYSTEMS:  ROS  ROS. Constitutional: Negative for chillsand fever. Positive for weakness HENT: Negative for sore throatand tinnitus.  Eyes: Negative for blurred visionand redness.  Respiratory: Positive forShortness of breath, cough, dyspnea on exertion.Negative for sputum production.  Cardiovascular: Negative for chest pain, palpitations, orthopneaand PND.  Gastrointestinal: Negative for abdominal painand diarrhea.  Genitourinary: Negative for dysuria, frequencyand urgency.  Musculoskeletal: Negative for joint painand myalgias.  Skin: Negative for rash.  Neurological: Negative for speech changeand focal weakness.  Endo/Heme/Allergies: Does not bruise/bleed easily.  Psychiatric/Behavioral: Negative for depressionand suicidal ideas.  DRUG ALLERGIES:   Allergies  Allergen Reactions  . No Known Allergies    VITALS:  Blood pressure 103/75, pulse 93, temperature 98 F (36.7 C), temperature source Oral, resp. rate 20, height '5\' 2"'$  (1.575 m), weight 61.3 kg (135 lb 2 oz), SpO2 100 %. PHYSICAL EXAMINATION:  Physical Exam  Physical Exam  Constitutional: She is oriented to person, place, and time. She appears well-developedand well-nourished. No distress.  HENT:  Head: Normocephalicand atraumatic.  Mouth/Throat: Oropharynx is  clear and moist.  Eyes: Conjunctivaeand EOMare normal. Pupils are equal, round, and reactive to light. No scleral icterus. Exophthlamos. Neck: Normal range of motion. Neck supple. No JVDpresent. No tracheal deviationpresent. No thyromegalypresent.  Cardiovascular: Normal rate, regular rhythmand normal heart sounds. Exam reveals no gallopand no friction rub.  No murmurheard. Respiratory: Coarse breath sounds diffusely. No wheezing. GI: Soft. Bowel sounds are normal. She exhibits no distension. There is no tenderness.  Musculoskeletal: Normal range of motion. She exhibits no edema.  Lymphadenopathy:  She has no cervical adenopathy.  Neurological: She is alertand oriented to person, place, and time. No cranial nerve deficit. She exhibits normal muscle tone.  Skin: Skin is warmand dry. No rashnoted. No erythema.  Psychiatric: She has a normal mood and affect. Her behavior is normal. Judgmentand thought contentnormal.  LABORATORY PANEL:   CBC  Recent Labs Lab 11/09/16 0423  WBC 4.7  HGB 11.5*  HCT 33.3*  PLT 116*   ------------------------------------------------------------------------------------------------------------------ Chemistries   Recent Labs Lab 11/07/16 0321  11/09/16 0423  NA 130*  < > 135  K 2.6*  < > 3.6  CL 91*  < > 106  CO2 25  < > 23  GLUCOSE 92  < > 62*  BUN 11  < > <5*  CREATININE 1.04*  < > 0.75  CALCIUM 8.5*  < > 7.2*  MG  --   < > 1.9  AST 29  --   --   ALT 11*  --   --   ALKPHOS 39  --   --   BILITOT 2.5*  --   --   < > = values in this interval not displayed. RADIOLOGY:  No results found. ASSESSMENT AND PLAN:   This  is a 55 year old female admitted for sepsis secondary to pneumonia.  1. Sepsis secondary to HCAP, improving slowly:Levaquin, IV fluids, O2 and nebs when necessary. Given history of COPD and slow progress I have added Solu-Medrol. I have also requested an ambulatory pulse ox to determine the patient's need for home  O2. 2. Hypokalemia - improved, will monitor BMP in a.m. 3. Hypomagnesemia - recheck today..  3. Swallowing difficulty, chronic - speech eval recommendations appreciated. 4. Hypertension: Continue ARB and metoprolol 5. Hyperthyroidism: Continue methimazole. TSH 33 - will need repeat once medically stable and outpatient endocrine followup.  6. COPD: Continue Spiriva 7. Lung cancer: With associated leukopenia; neutropenic precautions 9. DVT prophylaxis: Lovenox 10. GI prophylaxis: Pantoprazole per home regimen  PT eval for discharge planning.   Full Code  TOTAL TIME TAKING CARE OF THIS PATIENT: 48mnutes.    More than 50% of the time was spent in counseling/coordination of care: YES  POSSIBLE D/C IN 1 DAYS, DEPENDING ON CLINICAL CONDITION.   Markise Haymer D.O. on 11/09/2016 at 10:54 AM  Between 7am to 6pm - Pager - [phone]  After 6pm go to [email or web address] - pProofreader [facility]  Office  [phone] CC: Primary care physician; ADonnie Coffin MD  Note: This dictation was prepared with Dragon dictation along with smaller phrase technology. Any transcriptional errors that result from this process are unintentional.

## 2016-11-10 LAB — BASIC METABOLIC PANEL
Anion gap: 7 (ref 5–15)
BUN: 6 mg/dL (ref 6–20)
CHLORIDE: 106 mmol/L (ref 101–111)
CO2: 21 mmol/L — ABNORMAL LOW (ref 22–32)
Calcium: 7 mg/dL — ABNORMAL LOW (ref 8.9–10.3)
Creatinine, Ser: 0.79 mg/dL (ref 0.44–1.00)
GFR calc non Af Amer: >60 mL/min (ref >60)
Glucose, Bld: 199 mg/dL — ABNORMAL HIGH (ref 65–99)
POTASSIUM: 3.9 mmol/L (ref 3.5–5.1)
SODIUM: 134 mmol/L — AB (ref 135–145)

## 2016-11-10 LAB — CBC
HCT: 35.2 % (ref 35.0–47.0)
HEMOGLOBIN: 12.3 g/dL (ref 12.0–16.0)
MCH: 34.3 pg — AB (ref 26.0–34.0)
MCHC: 34.8 g/dL (ref 32.0–36.0)
MCV: 98.5 fL (ref 80.0–100.0)
Platelets: 154 10*3/uL (ref 150–440)
RBC: 3.58 MIL/uL — AB (ref 3.80–5.20)
RDW: 15.3 % — ABNORMAL HIGH (ref 11.5–14.5)
WBC: 7.4 10*3/uL (ref 3.6–11.0)

## 2016-11-10 MED ORDER — IPRATROPIUM-ALBUTEROL 0.5-2.5 (3) MG/3ML IN SOLN
3.0000 mL | RESPIRATORY_TRACT | Status: DC | PRN
  Administered 2016-11-10: 3 mL via RESPIRATORY_TRACT
  Filled 2016-11-10: qty 3

## 2016-11-10 MED ORDER — PREDNISONE 20 MG PO TABS
40.0000 mg | ORAL_TABLET | Freq: Every day | ORAL | Status: DC
  Administered 2016-11-11: 40 mg via ORAL
  Filled 2016-11-10: qty 2

## 2016-11-10 MED ORDER — LEVOFLOXACIN 750 MG PO TABS
750.0000 mg | ORAL_TABLET | Freq: Every day | ORAL | Status: DC
  Administered 2016-11-11: 10:00:00 750 mg via ORAL
  Filled 2016-11-10: qty 1

## 2016-11-10 MED ORDER — POLYETHYLENE GLYCOL 3350 17 G PO PACK
17.0000 g | PACK | Freq: Every day | ORAL | Status: DC
  Filled 2016-11-10 (×2): qty 1

## 2016-11-10 NOTE — Progress Notes (Signed)
Pharmacy Antibiotic Note  Audrey Mcgee is a 55 y.o. female admitted on 11/07/2016 with pneumonia.  Pharmacy has been consulted for vancomycin and Levaquin dosing.  Plan: Continue levaquin 750 mg q 24 hours ordered  Height: '5\' 2"'$  (157.5 cm) Weight: 137 lb 5 oz (62.3 kg) IBW/kg (Calculated) : 50.1  Temp (24hrs), Avg:97.9 F (36.6 C), Min:97.6 F (36.4 C), Max:98.1 F (36.7 C)   Recent Labs Lab 11/07/16 0321 11/07/16 1810 11/08/16 0434 11/09/16 0423 11/10/16 0512  WBC 1.8*  --  2.5* 4.7 7.4  CREATININE 1.04* 0.89 0.80 0.75 0.79  LATICACIDVEN 1.6  --   --   --   --     Estimated Creatinine Clearance: 70.6 mL/min (by C-G formula based on SCr of 0.79 mg/dL).    Allergies  Allergen Reactions  . No Known Allergies     Antimicrobials this admission: vancomycin 11/17 >> 11/17 Levaquin 11/17 >>   Dose adjustments this admission: Levaquin changed to q 24 hours for improving renal function on 11/18   Microbiology results: 11/17 BCx: NGTD 11/17 MRSA PCR: negative  Thank you for allowing pharmacy to be a part of this patient's care.  Rayna Sexton L 11/10/2016 10:06 AM

## 2016-11-10 NOTE — Care Management (Signed)
Admitted to [facility] with the diagnosis of sepsis. Lives with daughter Roderic Ovens and boyfriend. Curtis Watlington [phone]). Last seen Dr. Mike Gip 10/22/16. Seen Dr. Clide Deutscher 2 weeks ago. No home health. No skilled facility. No home oxygen., Uses no aids for ambulation. No falls, Appetite is better. Prescriptions are filled at [facility]. Takes care of all basic activities of daily living herself, doesn't drive. Family will transport. Physical therapy evaluation completed. SNF, if patient refuses arrange 24/7. Ms. Rosenstock doesn't want to go to a killed nursing facility.States that family and friends are in her home 24/7. Requested rolling walker when discharged. Possible discharge tomorrow per Dr. Oleh Genin RN MSN CCM Care Management

## 2016-11-10 NOTE — Progress Notes (Signed)
Patient ID: Audrey Mcgee, female   DOB: 02/10/1961, 55 y.o.   MRN: [id number]   [place] at [place] NAME: Audrey Mcgee    MR#:  [id number]  DATE OF BIRTH:  Oct 16, 1961  SUBJECTIVE:  CHIEF COMPLAINT:   Chief Complaint  Patient presents with  . Weakness   SOB Dry cough Feels weak Refusing SNF  Not ON home O2  REVIEW OF SYSTEMS:  ROS  ROS. Constitutional: Negative for chillsand fever. Positive for weakness HENT: Negative for sore throatand tinnitus.  Eyes: Negative for blurred visionand redness.  Respiratory: Positive forShortness of breath, cough, dyspnea on exertion.Negative for sputum production.  Cardiovascular: Negative for chest pain, palpitations, orthopneaand PND.  Gastrointestinal: Negative for abdominal painand diarrhea.  Genitourinary: Negative for dysuria, frequencyand urgency.  Musculoskeletal: Negative for joint painand myalgias.  Skin: Negative for rash.  Neurological: Negative for speech changeand focal weakness.  Endo/Heme/Allergies: Does not bruise/bleed easily.  Psychiatric/Behavioral: Negative for depressionand suicidal ideas.  DRUG ALLERGIES:   Allergies  Allergen Reactions  . No Known Allergies    VITALS:  Blood pressure 111/72, pulse 71, temperature 97.9 F (36.6 C), temperature source Oral, resp. rate 20, height '5\' 2"'$  (1.575 m), weight 62.3 kg (137 lb 5 oz), SpO2 95 %. PHYSICAL EXAMINATION:  Physical Exam  Physical Exam  Constitutional: She is oriented to person, place, and time. She appears well-developedand well-nourished. No distress.  HENT:  Head: Normocephalicand atraumatic.  Mouth/Throat: Oropharynx is clear and moist.  Eyes: Conjunctivaeand EOMare normal. Pupils are equal, round, and reactive to light. No scleral icterus. Exophthlamos. Neck: Normal range of motion. Neck supple. No JVDpresent. No tracheal deviationpresent. No thyromegalypresent.  Cardiovascular:  Normal rate, regular rhythmand normal heart sounds. Exam reveals no gallopand no friction rub.  No murmurheard. Respiratory: Coarse breath sounds diffusely. No wheezing. GI: Soft. Bowel sounds are normal. She exhibits no distension. There is no tenderness.  Musculoskeletal: Normal range of motion. She exhibits no edema.  Lymphadenopathy:  She has no cervical adenopathy.  Neurological: She is alertand oriented to person, place, and time. No cranial nerve deficit. She exhibits normal muscle tone.  Skin: Skin is warmand dry. No rashnoted. No erythema.  Psychiatric: She has a normal mood and affect. Her behavior is normal. Judgmentand thought contentnormal.  LABORATORY PANEL:   CBC  Recent Labs Lab 11/10/16 0512  WBC 7.4  HGB 12.3  HCT 35.2  PLT 154   ------------------------------------------------------------------------------------------------------------------ Chemistries   Recent Labs Lab 11/07/16 0321  11/09/16 0423 11/10/16 0512  NA 130*  < > 135 134*  K 2.6*  < > 3.6 3.9  CL 91*  < > 106 106  CO2 25  < > 23 21*  GLUCOSE 92  < > 62* 199*  BUN 11  < > <5* 6  CREATININE 1.04*  < > 0.75 0.79  CALCIUM 8.5*  < > 7.2* 7.0*  MG  --   < > 1.9  --   AST 29  --   --   --   ALT 11*  --   --   --   ALKPHOS 39  --   --   --   BILITOT 2.5*  --   --   --   < > = values in this interval not displayed. RADIOLOGY:  No results found. ASSESSMENT AND PLAN:   This is a 55 year old female admitted for sepsis secondary to pneumonia.  1. Sepsis secondary to HCAP, improving slowly: ON Abx  Nebs PRN added with flutter valve Cx negative  3. Swallowing difficulty, chronic - speech eval recommendations appreciated.  4. Hypertension: Continue ARB and metoprolol  5. Hyperthyroidism: Continue methimazole. TSH 33 - will need repeat once medically stable and outpatient endocrine followup.   6. COPD: Continue Spiriva  7. Lung cancer: With associated leukopenia; neutropenic  precautions  9. DVT prophylaxis: Lovenox  10. GI prophylaxis: Pantoprazole per home regimen  HH with PT at [place] THIS PATIENT: 20mnutes.   POSSIBLE D/C IN 1 DAYS, DEPENDING ON CLINICAL CONDITION.   SHillary BowR MD. on 11/10/2016 at 12:37 PM  Between 7am to 6pm - Pager - [phone]  After 6pm go to [email or web address] - pProofreader [facility] [place] [facility]  Office  [phone] CC: Primary care physician; ADonnie Coffin MD  Note: This dictation was prepared with Dragon dictation along with smaller phrase technology. Any transcriptional errors that result from this process are unintentional.

## 2016-11-10 NOTE — Progress Notes (Signed)
Speech Therapy Note:  Chart reviewed. Nursing indicated no concerns regarding Pt's swallowing abilities. Pt noted no difficulties swallowing and had just completed her lunch meal upon ST services entering. Pt stated her swallowing abilities are at her baseline. Recommend Pt continue on a Regular diet with Thin liquids at this time with general aspiration precautions. ST services will be available for additional education or evaluation if a decline in status during admission.   Rivka Safer, B.A. Clinical Graduate Student

## 2016-11-10 NOTE — Progress Notes (Signed)
Chaplain was making his rounds and visited with pt in room 128. Provided the ministry of prayer and a pastoral presence.   11/10/16 1415  Clinical Encounter Type  Visited With Patient  Visit Type Initial;Spiritual support  Referral From Nurse  Spiritual Encounters  Spiritual Needs Prayer

## 2016-11-11 ENCOUNTER — Ambulatory Visit: Admission: RE | Admit: 2016-11-11 | Payer: Medicaid Other | Source: Ambulatory Visit

## 2016-11-11 MED ORDER — LEVOFLOXACIN 750 MG PO TABS: 750.0000 mg | ORAL_TABLET | Freq: Every day | ORAL | 0 refills | Status: DC

## 2016-11-11 MED ORDER — PREDNISONE 20 MG PO TABS: 40.0000 mg | ORAL_TABLET | Freq: Every day | ORAL | 0 refills | Status: DC

## 2016-11-11 NOTE — Progress Notes (Signed)
PT Cancellation Note  Patient Details Name: Audrey Mcgee MRN: [id number] DOB: September 08, 1961   Cancelled Treatment:    Reason Eval/Treat Not Completed: Patient declined, no reason specified. Treatment attempted earlier this morning to establish pt's functional mobility. Pt sitting in bed with friend or family member present ( visitor does not answer up asking any questions). Pt refuses all attempts for therapy despite explanation of purpose for visit. Pt states she has a boyfriend and kids to assist her and a therapist to come to the house. Pt short and agitated with therapists attempts and states she doesn't need to do anything until she gets home. Pt adamant about wanting to leave immediately. Recommendation continues to be skilled nursing facility based on last assessment and refusing to get out of bed/ambulate and or attempt steps.    Larae Grooms, PTA 11/11/2016, 10:48 AM

## 2016-11-11 NOTE — Progress Notes (Signed)
Pt for discharge home.  No resp distress. Pt  sats  96-97% on r/a after 02 d/cd.  Instructions discussed with pt. meds  Diet activity and f/u discussed.  meds sent in to pts pharmacy. Home at this time via w/c w/o c/o.

## 2016-11-11 NOTE — Care Management (Signed)
Discharge to home today per Dr. Darvin Neighbours. Nursing services will be provided by [facility]. Rolling [place] [facility]ile will be provided by [facility]. Family will transport. Shelbie Ammons RN MSN CCM Care Management

## 2016-11-11 NOTE — Plan of Care (Signed)
Problem: Safety: Goal: Ability to remain free from injury will improve Outcome: Progressing Walker to be provided for home  Use encouraged to use

## 2016-11-11 NOTE — Discharge Instructions (Signed)
Resume diet and activity as before  Always use your walker to prevent falls

## 2016-11-11 NOTE — Progress Notes (Signed)
Pharmacy Antibiotic Note  Audrey Mcgee is a 55 y.o. female admitted on 11/07/2016 with pneumonia.  Pharmacy has been consulted for vancomycin and Levaquin dosing.  Plan: Continue levaquin 750 mg q 24 hours ordered  Height: '5\' 2"'$  (157.5 cm) Weight: 144 lb 4.8 oz (65.5 kg) IBW/kg (Calculated) : 50.1  Temp (24hrs), Avg:97.7 F (36.5 C), Min:97.5 F (36.4 C), Max:98.3 F (36.8 C)   Recent Labs Lab 11/07/16 0321 11/07/16 1810 11/08/16 0434 11/09/16 0423 11/10/16 0512  WBC 1.8*  --  2.5* 4.7 7.4  CREATININE 1.04* 0.89 0.80 0.75 0.79  LATICACIDVEN 1.6  --   --   --   --     Estimated Creatinine Clearance: 72.3 mL/min (by C-G formula based on SCr of 0.79 mg/dL).    Allergies  Allergen Reactions  . No Known Allergies     Antimicrobials this admission: vancomycin 11/17 >> 11/17 Levaquin 11/17 >>   Dose adjustments this admission: Levaquin changed to q 24 hours for improving renal function on 11/18   Microbiology results: 11/17 BCx: NGTD 11/17 MRSA PCR: negative  Thank you for allowing pharmacy to be a part of this patient's care.  Loree Fee, PharmD 11/11/2016 10:29 AM

## 2016-11-12 ENCOUNTER — Ambulatory Visit: Payer: Medicaid Other

## 2016-11-12 LAB — CULTURE, BLOOD (ROUTINE X 2)
Culture: NO GROWTH
Culture: NO GROWTH

## 2016-11-17 ENCOUNTER — Inpatient Hospital Stay: Payer: Medicaid Other | Admitting: Hematology and Oncology

## 2016-11-17 ENCOUNTER — Inpatient Hospital Stay: Payer: Medicaid Other

## 2016-11-17 NOTE — Progress Notes (Deleted)
[facility] day:  11/17/2016   Chief Complaint: Audrey Mcgee is a 55 y.o. female with metastatic poorly differentiated non-small cell lung cancer who is seen for reassessment after 6 cycles of Alimta and interval hospitalization.  HPI: The patient was last seen in the medical oncology clinic by me on 10/31/2016.  At that time, she felt good except for a little headache and shoulder pain.  She had been sick with the flu the month prior to her visit.  She needed to reschedule her appointment with ENT and CT scan to further evaluate her left vocal cord paralysis.  She received her Alimta uneventfully.  Restaging PET scan was ordered.  We discussed her increased creatinine at last visit.  She was encouraged to drink fluids.  She was admitted to [facility] from 11/07/2016 - 11/11/2016 with weakness and tachypnea.  She described poor oral intake x 3-4 days secondary to nausea and vomiting.  CXR on 11/09/2016 revealed developing right and possible left sided airspace opacities suspicious for pneumonia.  She was treated with Levaquin.  CBC on 11/07/2016 revealed a hematocrit of 44.7, hemoglobin 15.7, MCV 96, platelets 145,000, white count 1800.  Creatinine was 1.04. Acid was 2.6 lactic acid was 1.6.  CBC on 11/10/2016 included a hematocrit of 35.2, hemoglobin 12.3, MCV 98.5, platelets 154,000, white count 7400.   Creatinine was 0.79.  Symptomatically,  Past Medical History:  Diagnosis Date  . COPD (chronic obstructive pulmonary disease) ([place])   . Hypertension   . Lung cancer ([place])   . Thyroid disease     Past Surgical History:  Procedure Laterality Date  . CESAREAN SECTION CLASSICAL    . STOMACH SURGERY     Pt reports for a tumor    Family History  Problem Relation Age of Onset  . Hypertension Father   . Cancer Father   . Diabetes Father   . Cancer Maternal Aunt     Social History:  reports that she has quit smoking. Her smoking use  included Cigarettes. She quit after 15.00 years of use. She has never used smokeless tobacco. She reports that she does not drink alcohol or use drugs.  She stopped smoking.  Her nephew shot in [place] (caused delays in her treatment).  Her boyfriend's father  died of metastatic lung cancer.  Her boyfriend works at a school with children.  The patient's 1st cousin died (age 12) of a cerebral aneurysm.  The patient is alone today.    Allergies:  Allergies  Allergen Reactions  . No Known Allergies     Current Medications: Current Outpatient Prescriptions  Medication Sig Dispense Refill  . benzonatate (TESSALON) 100 MG capsule Take 1 capsule (100 mg total) by mouth 3 (three) times daily as needed for cough. 20 capsule 0  . dexamethasone (DECADRON) 4 MG tablet Take 11m BID the day before and day after chemotherapy. (Patient taking differently: Take 4 mg by mouth 2 (two) times daily. Take 422mBID the day before and day after chemotherapy.) 20 tablet 0  . docusate sodium (COLACE) 100 MG capsule Take 100 mg by mouth 2 (two) times daily as needed for mild constipation.    . folic acid (FOLVITE) 80438CG tablet Take 800 mcg by mouth daily.     . Marland Kitchenevofloxacin (LEVAQUIN) 750 MG tablet Take 1 tablet (750 mg total) by mouth daily. 4 tablet 0  . lidocaine (XYLOCAINE) 2 % solution Use as directed 10 mLs in the mouth  or throat every 4 (four) hours as needed for mouth pain.    [name] losartan (COZAAR) 100 MG tablet Take 1 tablet (100 mg total) by mouth daily. 30 tablet 0  . magic mouthwash w/lidocaine SOLN Take 5 mLs by mouth 4 (four) times daily as needed for mouth pain. 480 mL 1  . magnesium oxide (MAG-OX) 400 MG tablet Take 1 tablet (400 mg total) by mouth daily. 30 tablet 0  . megestrol (MEGACE) 40 MG/ML suspension Take 5 mLs (200 mg total) by mouth 2 (two) times daily. 300 mL 1  . methimazole (TAPAZOLE) 5 MG tablet take 1 tablet by mouth twice a day for OVERACTIVE THYROID  0  . metoprolol (LOPRESSOR) 50 MG  tablet take 1 tablet by mouth twice a day NOTE DOSE INCREASE  0  . morphine (MS CONTIN) 15 MG 12 hr tablet Take 1 tablet (15 mg total) by mouth 2 (two) times daily. 30 tablet 0  . omeprazole (PRILOSEC) 20 MG capsule Take 1 capsule (20 mg total) by mouth daily. 30 capsule 3  . ondansetron (ZOFRAN) 8 MG tablet Take 1 tablet (8 mg total) by mouth 2 (two) times daily as needed for nausea or vomiting. 30 tablet 1  . Oxycodone HCl 10 MG TABS Take 1 tablet (10 mg total) by mouth every 4 (four) hours as needed (pain). 30 tablet 0  . predniSONE (DELTASONE) 20 MG tablet Take 2 tablets (40 mg total) by mouth daily with breakfast. 8 tablet 0  . tiotropium (SPIRIVA) 18 MCG inhalation capsule Place 1 capsule (18 mcg total) into inhaler and inhale daily. 30 capsule 1   No current facility-administered medications for this visit.     Review of Systems:  GENERAL: Feels "good". No fevers or chills. Weight stable. PERFORMANCE STATUS (ECOG): 1 HEENT: Chronic hoarseness (see HPI).  No sore throat, mouth sores or tenderness. Lungs:  Shortness of breath with exertion (stable).  Chronic cough.  No hemoptysis. Cardiac: No chest pain, palpitations, orthopnea, or PND. GI: Nausea x 3 days post chemotherapy.  No vomiting, diarrhea, constipation, melena or hematochezia. GU: No urgency, frequency, dysuria, or hematuria. Musculoskeletal: Right shoulder pain.No muscle tenderness. Extremities: No pain or swelling. Skin: No rashes or skin changes. Neuro: Headache (rare).  No numbness or weakness, balance or coordination issues. Endocrine: No diabetes.  Thyroid issues.  No hot flashes or night sweats. Psych: No mood changes, depression or anxiety. Pain: Pain in shoulder (stable). Review of systems: All other systems reviewed and found to be negative.  Physical Exam: There were no vitals taken for this visit.  GENERAL: Thin woman sitting comfortably in the exam room in no acute distress. MENTAL STATUS:  Alert and oriented to person, place and time. HEAD: Short dark styled wig Normocephalic, atraumatic, no Cushingoid features.  EYES: Brown eyes. Ruddy sclera.  Pupils equal round and reactive to light and accomodation. No conjunctivitis or scleral icterus. ENT: Hoarse.  Poor dentition.  Oropharynx clear without lesion. Tongue normal. Mucous membranes moist.  RESPIRATORY: Clear to auscultation without rales, wheezes or rhonchi. CARDIOVASCULAR: Regular rate and rhythm without murmur, rub or gallop. ABDOMEN: Soft, non-tender, with active bowel sounds, and no hepatosplenomegaly. No masses. SKIN: No rashes, ulcers or lesions. EXTREMITIES: No edema, skin discoloration or tenderness. No palpable cords. LYMPH NODES: No palpable cervical, supraclavicular, axillary or inguinal adenopathy  NEUROLOGICAL: Unremarkable. PSYCH: Appropriate.   No visits with results within 3 Day(s) from this visit.  Latest known visit with results is:  Admission on 11/07/2016, Discharged  on 11/11/2016  Component Date Value Ref Range Status  . WBC 11/07/2016 1.8* 3.6 - 11.0 K/uL Final  . RBC 11/07/2016 4.66  3.80 - 5.20 MIL/uL Final  . Hemoglobin 11/07/2016 15.7  12.0 - 16.0 g/dL Final  . HCT 11/07/2016 44.7  35.0 - 47.0 % Final  . MCV 11/07/2016 96.0  80.0 - 100.0 fL Final  . MCH 11/07/2016 33.6  26.0 - 34.0 pg Final  . MCHC 11/07/2016 35.0  32.0 - 36.0 g/dL Final  . RDW 11/07/2016 15.2* 11.5 - 14.5 % Final  . Platelets 11/07/2016 145* 150 - 440 K/uL Final  . Sodium 11/07/2016 130* 135 - 145 mmol/L Final  . Potassium 11/07/2016 2.6* 3.5 - 5.1 mmol/L Final   Comment: CRITICAL RESULT CALLED TO, READ BACK BY AND VERIFIED WITH BRIAN GRAY ON 11/07/16 AT 0418 BY TLB   . Chloride 11/07/2016 91* 101 - 111 mmol/L Final  . CO2 11/07/2016 25  22 - 32 mmol/L Final  . Glucose, Bld 11/07/2016 92  65 - 99 mg/dL Final  . BUN 11/07/2016 11  6 - 20 mg/dL Final  . Creatinine, Ser 11/07/2016 1.04* 0.44 - 1.00 mg/dL  Final  . Calcium 11/07/2016 8.5* 8.9 - 10.3 mg/dL Final  . Total Protein 11/07/2016 7.5  6.5 - 8.1 g/dL Final  . Albumin 11/07/2016 3.5  3.5 - 5.0 g/dL Final  . AST 11/07/2016 29  15 - 41 U/L Final  . ALT 11/07/2016 11* 14 - 54 U/L Final  . Alkaline Phosphatase 11/07/2016 39  38 - 126 U/L Final  . Total Bilirubin 11/07/2016 2.5* 0.3 - 1.2 mg/dL Final  . GFR calc non Af Amer 11/07/2016 >60  >60 mL/min Final  . GFR calc Af Amer 11/07/2016 >60  >60 mL/min Final   Comment: (NOTE) The eGFR has been calculated using the CKD EPI equation. This calculation has not been validated in all clinical situations. eGFR's persistently <60 mL/min signify possible Chronic Kidney Disease.   . Anion gap 11/07/2016 14  5 - 15 Final  . Color, Urine 11/07/2016 AMBER* YELLOW Final  . APPearance 11/07/2016 CLEAR* CLEAR Final  . Glucose, UA 11/07/2016 NEGATIVE  NEGATIVE mg/dL Final  . Bilirubin Urine 11/07/2016 NEGATIVE  NEGATIVE Final  . Ketones, ur 11/07/2016 TRACE* NEGATIVE mg/dL Final  . Specific Gravity, Urine 11/07/2016 1.018  1.005 - 1.030 Final  . Hgb urine dipstick 11/07/2016 NEGATIVE  NEGATIVE Final  . pH 11/07/2016 5.0  5.0 - 8.0 Final  . Protein, ur 11/07/2016 30* NEGATIVE mg/dL Final  . Nitrite 11/07/2016 NEGATIVE  NEGATIVE Final  . Leukocytes, UA 11/07/2016 NEGATIVE  NEGATIVE Final  . RBC / HPF 11/07/2016 0-5  0 - 5 RBC/hpf Final  . WBC, UA 11/07/2016 0-5  0 - 5 WBC/hpf Final  . Bacteria, UA 11/07/2016 RARE* NONE SEEN Final  . Squamous Epithelial / LPF 11/07/2016 0-5* NONE SEEN Final  . Mucous 11/07/2016 PRESENT   Final  . Specimen Description 11/12/2016 BLOOD LEFT ARM   Final  . Special Requests 11/12/2016 BOTTLES DRAWN AEROBIC AND ANAEROBIC  ANA 15ML AER 9ML   Final  . Culture 11/12/2016 NO GROWTH 5 DAYS   Final  . Report Status 11/12/2016 11/12/2016 FINAL   Final  . Specimen Description 11/12/2016 BLOOD  LEFT AC   Final  . Special Requests 11/12/2016 BOTTLES DRAWN AEROBIC AND ANAEROBIC   ANA 10ML AER 11ML   Final  . Culture 11/12/2016 NO GROWTH 5 DAYS   Final  . Report Status  11/12/2016 11/12/2016 FINAL   Final  . Lactic Acid, Venous 11/07/2016 1.6  0.5 - 1.9 mmol/L Final  . MRSA by PCR 11/07/2016 NEGATIVE  NEGATIVE Final   Comment:        The GeneXpert MRSA Assay (FDA approved for NASAL specimens only), is one component of a comprehensive MRSA colonization surveillance program. It is not intended to diagnose MRSA infection nor to guide or monitor treatment for MRSA infections.   [name] TSH 11/07/2016 33.139* 0.350 - 4.500 uIU/mL Final  . Sodium 11/07/2016 133* 135 - 145 mmol/L Final  . Potassium 11/07/2016 3.6  3.5 - 5.1 mmol/L Final  . Chloride 11/07/2016 100* 101 - 111 mmol/L Final  . CO2 11/07/2016 25  22 - 32 mmol/L Final  . Glucose, Bld 11/07/2016 94  65 - 99 mg/dL Final  . BUN 11/07/2016 8  6 - 20 mg/dL Final  . Creatinine, Ser 11/07/2016 0.89  0.44 - 1.00 mg/dL Final  . Calcium 11/07/2016 7.3* 8.9 - 10.3 mg/dL Final  . GFR calc non Af Amer 11/07/2016 >60  >60 mL/min Final  . GFR calc Af Amer 11/07/2016 >60  >60 mL/min Final   Comment: (NOTE) The eGFR has been calculated using the CKD EPI equation. This calculation has not been validated in all clinical situations. eGFR's persistently <60 mL/min signify possible Chronic Kidney Disease.   . Anion gap 11/07/2016 8  5 - 15 Final  . Magnesium 11/07/2016 1.5* 1.7 - 2.4 mg/dL Final  . Sodium 11/08/2016 134* 135 - 145 mmol/L Final  . Potassium 11/08/2016 3.3* 3.5 - 5.1 mmol/L Final  . Chloride 11/08/2016 103  101 - 111 mmol/L Final  . CO2 11/08/2016 24  22 - 32 mmol/L Final  . Glucose, Bld 11/08/2016 95  65 - 99 mg/dL Final  . BUN 11/08/2016 6  6 - 20 mg/dL Final  . Creatinine, Ser 11/08/2016 0.80  0.44 - 1.00 mg/dL Final  . Calcium 11/08/2016 7.5* 8.9 - 10.3 mg/dL Final  . GFR calc non Af Amer 11/08/2016 >60  >60 mL/min Final  . GFR calc Af Amer 11/08/2016 >60  >60 mL/min Final   Comment: (NOTE) The eGFR has  been calculated using the CKD EPI equation. This calculation has not been validated in all clinical situations. eGFR's persistently <60 mL/min signify possible Chronic Kidney Disease.   . Anion gap 11/08/2016 7  5 - 15 Final  . WBC 11/08/2016 2.5* 3.6 - 11.0 K/uL Final  . RBC 11/08/2016 3.73* 3.80 - 5.20 MIL/uL Final  . Hemoglobin 11/08/2016 12.7  12.0 - 16.0 g/dL Final  . HCT 11/08/2016 36.4  35.0 - 47.0 % Final  . MCV 11/08/2016 97.5  80.0 - 100.0 fL Final  . MCH 11/08/2016 34.1* 26.0 - 34.0 pg Final  . MCHC 11/08/2016 35.0  32.0 - 36.0 g/dL Final  . RDW 11/08/2016 14.9* 11.5 - 14.5 % Final  . Platelets 11/08/2016 109* 150 - 440 K/uL Final  . Sodium 11/09/2016 135  135 - 145 mmol/L Final  . Potassium 11/09/2016 3.6  3.5 - 5.1 mmol/L Final  . Chloride 11/09/2016 106  101 - 111 mmol/L Final  . CO2 11/09/2016 23  22 - 32 mmol/L Final  . Glucose, Bld 11/09/2016 62* 65 - 99 mg/dL Final  . BUN 11/09/2016 <5* 6 - 20 mg/dL Final  . Creatinine, Ser 11/09/2016 0.75  0.44 - 1.00 mg/dL Final  . Calcium 11/09/2016 7.2* 8.9 - 10.3 mg/dL Final  . GFR calc non Af Wyvonnia Lora  11/09/2016 >60  >60 mL/min Final  . GFR calc Af Amer 11/09/2016 >60  >60 mL/min Final   Comment: (NOTE) The eGFR has been calculated using the CKD EPI equation. This calculation has not been validated in all clinical situations. eGFR's persistently <60 mL/min signify possible Chronic Kidney Disease.   . Anion gap 11/09/2016 6  5 - 15 Final  . WBC 11/09/2016 4.7  3.6 - 11.0 K/uL Final  . RBC 11/09/2016 3.41* 3.80 - 5.20 MIL/uL Final  . Hemoglobin 11/09/2016 11.5* 12.0 - 16.0 g/dL Final  . HCT 11/09/2016 33.3* 35.0 - 47.0 % Final  . MCV 11/09/2016 97.8  80.0 - 100.0 fL Final  . MCH 11/09/2016 33.9  26.0 - 34.0 pg Final  . MCHC 11/09/2016 34.6  32.0 - 36.0 g/dL Final  . RDW 11/09/2016 15.0* 11.5 - 14.5 % Final  . Platelets 11/09/2016 116* 150 - 440 K/uL Final  . Neutrophils Relative % 11/09/2016 69  % Final  . Neutro Abs  11/09/2016 3.2  1.4 - 6.5 K/uL Final  . Lymphocytes Relative 11/09/2016 19  % Final  . Lymphs Abs 11/09/2016 0.9* 1.0 - 3.6 K/uL Final  . Monocytes Relative 11/09/2016 12  % Final  . Monocytes Absolute 11/09/2016 0.6  0.2 - 0.9 K/uL Final  . Eosinophils Relative 11/09/2016 0  % Final  . Eosinophils Absolute 11/09/2016 0.0  0 - 0.7 K/uL Final  . Basophils Relative 11/09/2016 0  % Final  . Basophils Absolute 11/09/2016 0.0  0 - 0.1 K/uL Final  . Magnesium 11/09/2016 1.9  1.7 - 2.4 mg/dL Final  . WBC 11/10/2016 7.4  3.6 - 11.0 K/uL Final  . RBC 11/10/2016 3.58* 3.80 - 5.20 MIL/uL Final  . Hemoglobin 11/10/2016 12.3  12.0 - 16.0 g/dL Final  . HCT 11/10/2016 35.2  35.0 - 47.0 % Final  . MCV 11/10/2016 98.5  80.0 - 100.0 fL Final  . MCH 11/10/2016 34.3* 26.0 - 34.0 pg Final  . MCHC 11/10/2016 34.8  32.0 - 36.0 g/dL Final  . RDW 11/10/2016 15.3* 11.5 - 14.5 % Final  . Platelets 11/10/2016 154  150 - 440 K/uL Final  . Sodium 11/10/2016 134* 135 - 145 mmol/L Final  . Potassium 11/10/2016 3.9  3.5 - 5.1 mmol/L Final  . Chloride 11/10/2016 106  101 - 111 mmol/L Final  . CO2 11/10/2016 21* 22 - 32 mmol/L Final  . Glucose, Bld 11/10/2016 199* 65 - 99 mg/dL Final  . BUN 11/10/2016 6  6 - 20 mg/dL Final  . Creatinine, Ser 11/10/2016 0.79  0.44 - 1.00 mg/dL Final  . Calcium 11/10/2016 7.0* 8.9 - 10.3 mg/dL Final  . GFR calc non Af Amer 11/10/2016 >60  >60 mL/min Final  . GFR calc Af Amer 11/10/2016 >60  >60 mL/min Final   Comment: (NOTE) The eGFR has been calculated using the CKD EPI equation. This calculation has not been validated in all clinical situations. eGFR's persistently <60 mL/min signify possible Chronic Kidney Disease.   . Anion gap 11/10/2016 7  5 - 15 Final    Assessment:  Audrey Mcgee is a 55 y.o. female with metastatic poorly differentiated adenocarcinoma of lung. She presented with a 5 month history of enlarging bilateral neck masses, progressive hoarseness,  dysphagia, dyspnea on exertion, cough, left sided hearing loss, neck pain, and a 30 pound weight loss.  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 Biopsy of the right supraclavicular lymph node on 10/03/2014 revealed  poorly differentiated non-small cell carcinoma, favoring adenocarcinoma consistent with lung primary (TTF-1 positive). KRAS was positive.   PET scan on 10/24/2014 revealed a left hilar mass with associated left upper lobe obstruction/atelectasis with extensive bilateral mediastinal and cervical adenopathy. Chest CT from 10/10/2014 also noted a 2.2 x 0.9 cm soft tissue density with internal calcifications in the right breast. Head MRI revealed no evidence of metastatic disease.  She received palliative radiation (3000 cGy) to the left lung from 10/24/2014 - 11/07/2014. She received 6 cycles carboplatin and Alimta (11/14/2014 - 04/30/2015). There was a delay between cycle #2 and cycle #3 secondary to a switch in caregivers.  She was diagnosed with a left peri-hilar infiltrate on 02/23/2015. She has completed a course of Levaquin. Chest CT on 03/21/2015 revealed LUL scarring, patchy ill defined airspace opacities in the lingula, upper esophageal thickening, and small mediastinal and hilar nodes. She completed a course of Azithromycin.  She received 2 cycles of maintenance Alimta (05/22/2015 - 06/15/2015). Chest CT on 07/09/2015 revealed patchy ground glass and nodular consolidation in the left upper lobe and to a lesser extent left lower lobe, minimally progressive from 03/21/2015.  Bone scan on 07/09/2015 revealed no evidence of metastatic disease.  CXR on 09/24/2015 revealed patchy left perihilar airspace opacities.  She was treated with azithromycin.  Chest CT on 10/17/2015 revealed patchy ground-glass and ill-defined nodular areas of opacity in the left apex, lingula, and anterior left lower lobe generally appeared decreased in the interval  although 1 of the nodular areas in the left apex has progressed slightly since the previous study.  Overall imaging features are felt to be most likely treatment related.  There had been an interval slight decrease in proximal to mid esophageal circumferential wall thickening, potentially treatment related.   She was lost to follow-up for approximately 3 months.  Chest and abdomen CT scan on 02/04/2016 revealed evolving radiation changes in the left upper and left lower lobes. Previously measured left upper lobe nodule was no longer visualized. There was no evidence of metastatic disease.  Right hilar lymph nodes were not considered enlarged by CT size criteria and are nonspecific, but appeared new from the prior exam.  Bone scan on 02/19/2016 revealed no definite evidence of metastatic disease. There was subtle uptake within the posterior aspects of the upper ribs bilaterally (nonspecific and not entirely new).  There was a tiny focus of increased uptake near the vertex of the calvarium (new).  She has been hoarse since 02/01/2016.  She has ongoing hoarseness and difficulty swallowing.  She has not had her ENT or swallowing evaluations.  She has had delays in therapy secondary to tooth abscesses.  She has had another delay in therapy secondary to "the flu".   She has received 6 cycles of maintenance Alimta (restarted 02/29/2016 - 06/06/2016; 08/01/2016; 10/31/2016).  She receives B12 every 9 weeks (last 10/24/2016) and is on daily folic acid.   CEA has been monitored: 3.3 on 02/29/2016, 4.1 on 03/28/2016, 4.2 on 04/17/2016, 5.0 on 08/22/2016 and 9.4 on 10/31/2016.  She was admitted to [facility] from 06/21/2016 - 06/22/2016 with severe shortness of breath secondary to possible aspiration.  Chest CT angiogram on 06/21/2016 revealed no evidence of pulmonary embolism . There was stable patchy peribronchovascular interstitial thickening, volume loss and patchy reticulation in the left upper lobe and central left  lower lobe, presumably representing stable posttreatment change.  Bilateral lower extremity duplex revealed no evidence of thrombosis.  She was discharged on prednisone, Spiriva, and Levaquin.  She was admitted to [facility] from 11/07/2016 - 11/11/2016 with sepsis and hospital acquired pneumonia.  CXR on 11/09/2016 revealed developing right and possible left sided airspace opacities suspicious for pneumonia.  She had poor oral intake x 3-4 days secondary to nausea and vomiting.  She was treated with Levaquin.  She has hyperthyroidism.  She is on methimazole.  TSH was 0.037 (low) and free T4 2.27 (high) on 08/01/2016.    Symptomatically, she feels good today.  She has chronic shoulder pain.  Weight is stable.  Magnesium is 1.8.  Plan: 1.  Labs today:  CBC with diff, CMP, Mg, CEA.  3.  Re-review importance of folic acid and Decadron while taking Alimta. 4.  Schedule restaging PET scan. 5.  Discuss need to reschedule appointments with ENT. 6.  Rx: oxycodone 10 mg po q 4 hours prn pain. 7.  Rx:  MS Contin 15 mg po q 12 hours 8.  Rx:  Decadron 4 mg BID day before and after chemotherapy. 9.  Urine drug screen. 10.  RTC after PET scan.   Lequita Asal, MD  11/17/2016, 5:04 AM

## 2016-11-18 NOTE — Discharge Summary (Signed)
[facility] [place] NAME: Audrey Mcgee    MR#:  [id number]  DATE OF BIRTH:  [birth year]  DATE OF ADMISSION:  11/07/2016 ADMITTING PHYSICIAN: Harrie Foreman, MD  DATE OF DISCHARGE: 11/11/2016 11:15 AM  PRIMARY CARE PHYSICIAN: Donnie Coffin, MD   ADMISSION DIAGNOSIS:  flu like symptoms   DISCHARGE DIAGNOSIS:  Active Problems:   Sepsis ([place])   Protein-calorie malnutrition, severe   SECONDARY DIAGNOSIS:   Past Medical History:  Diagnosis Date  . COPD (chronic obstructive pulmonary disease) ([place])   . Hypertension   . Lung cancer ([place])   . Thyroid disease      ADMITTING HISTORY  HPI: The patient with past medical history of lung cancer and COPD presents to emergency department complaining of weakness. She was found to be very tachypneic but not hypoxic. Also, she states that she has not been able to eat for 4 days due to nausea and vomiting. She denies fevers but admits to coughing. Chest x-ray in the emergency department showed some opacification of the lower lobes that was not interpreted as consolidation, however the emergency department staff as well as the hospitalist service agreed that due to her immunocompromised and clinical presentation that pneumonia may be developing which prompted the hospitalist service to admit for IV antibiotic treatment.  HOSPITAL COURSE:   This is a 55 year old female admitted for sepsis secondary to pneumonia.  1. Sepsis secondary to HCAP- resolved ON IV Abx in hospital and changed to PO at discharge Nebs PRN Cx negative On Room Air and afebrile with SOB back to normal by day of discharge  3. Swallowing difficulty, chronic - speech eval recommendations appreciated.  4. Hypertension: Continue ARB and metoprolol  5. Hyperthyroidism: Continue methimazole.  6. COPD: Continue Spiriva  7. Lung cancer: With associated leukopenia  Stable for discharge home. F/U with PCP and  Oncology CONSULTS OBTAINED:    DRUG ALLERGIES:   Allergies  Allergen Reactions  . No Known Allergies     DISCHARGE MEDICATIONS:   Discharge Medication List as of 11/11/2016 10:23 AM    START taking these medications   Details  levofloxacin (LEVAQUIN) 750 MG tablet Take 1 tablet (750 mg total) by mouth daily., Starting Tue 11/11/2016, Normal    predniSONE (DELTASONE) 20 MG tablet Take 2 tablets (40 mg total) by mouth daily with breakfast., Starting Tue 11/11/2016, Normal      CONTINUE these medications which have NOT CHANGED   Details  benzonatate (TESSALON) 100 MG capsule Take 1 capsule (100 mg total) by mouth 3 (three) times daily as needed for cough., Starting 09/24/2015, Until Discontinued, Normal    dexamethasone (DECADRON) 4 MG tablet Take '4mg'$  BID the day before and day after chemotherapy., Normal    docusate sodium (COLACE) 100 MG capsule Take 100 mg by mouth 2 (two) times daily as needed for mild constipation., Until Discontinued, Historical Med    folic acid (FOLVITE) 696 MCG tablet Take 800 mcg by mouth daily. , Until Discontinued, Historical Med    lidocaine (XYLOCAINE) 2 % solution Use as directed 10 mLs in the mouth or throat every 4 (four) hours as needed for mouth pain., Until Discontinued, Historical Med    losartan (COZAAR) 100 MG tablet Take 1 tablet (100 mg total) by mouth daily., Starting 06/22/2016, Until Discontinued, Print    magic mouthwash w/lidocaine SOLN Take 5 mLs by mouth 4 (four) times daily as needed for mouth pain., Starting 08/31/2015, Until Discontinued, Fill  Later    magnesium oxide (MAG-OX) 400 MG tablet Take 1 tablet (400 mg total) by mouth daily., Starting 04/17/2016, Until Discontinued, Print    megestrol (MEGACE) 40 MG/ML suspension Take 5 mLs (200 mg total) by mouth 2 (two) times daily., Starting Mon 09/22/2016, Normal    methimazole (TAPAZOLE) 5 MG tablet take 1 tablet by mouth twice a day for OVERACTIVE THYROID, Historical Med     metoprolol (LOPRESSOR) 50 MG tablet take 1 tablet by mouth twice a day NOTE DOSE INCREASE, Historical Med    morphine (MS CONTIN) 15 MG 12 hr tablet Take 1 tablet (15 mg total) by mouth 2 (two) times daily., Starting Fri 10/31/2016, Print    omeprazole (PRILOSEC) 20 MG capsule Take 1 capsule (20 mg total) by mouth daily., Starting 06/20/2016, Until Discontinued, Normal    ondansetron (ZOFRAN) 8 MG tablet Take 1 tablet (8 mg total) by mouth 2 (two) times daily as needed for nausea or vomiting., Starting Fri 08/22/2016, Print    Oxycodone HCl 10 MG TABS Take 1 tablet (10 mg total) by mouth every 4 (four) hours as needed (pain)., Starting Fri 10/31/2016, Print    tiotropium (SPIRIVA) 18 MCG inhalation capsule Place 1 capsule (18 mcg total) into inhaler and inhale daily., Starting 06/22/2016, Until Discontinued, Print        Today   VITAL SIGNS:  Blood pressure 115/85, pulse 75, temperature 97.6 F (36.4 C), temperature source Oral, resp. rate 20, height '5\' 2"'$  (1.575 m), weight 65.5 kg (144 lb 4.8 oz), SpO2 98 %.  I/O:  No intake or output data in the 24 hours ending 11/18/16 0255  PHYSICAL EXAMINATION:  Physical Exam  GENERAL:  55 y.o.-year-old patient lying in the bed with no acute distress.  LUNGS: Normal breath sounds bilaterally, no wheezing, rales,rhonchi or crepitation. No use of accessory muscles of respiration.  CARDIOVASCULAR: S1, S2 normal. No murmurs, rubs, or gallops.  ABDOMEN: Soft, non-tender, non-distended. Bowel sounds present. No organomegaly or mass.  NEUROLOGIC: Moves all 4 extremities. PSYCHIATRIC: The patient is alert and oriented x 3.  SKIN: No obvious rash, lesion, or ulcer.   DATA REVIEW:   CBC No results for input(s): WBC, HGB, HCT, PLT in the last 168 hours.  Chemistries  No results for input(s): NA, K, CL, CO2, GLUCOSE, BUN, CREATININE, CALCIUM, MG, AST, ALT, ALKPHOS, BILITOT in the last 168 hours.  Invalid input(s): GFRCGP  Cardiac Enzymes No  results for input(s): TROPONINI in the last 168 hours.  Microbiology Results  Results for orders placed or performed during the hospital encounter of 11/07/16  Blood culture (routine x 2)     Status: None   Collection Time: 11/07/16  3:20 AM  Result Value Ref Range Status   Specimen Description BLOOD LEFT ARM  Final   Special Requests   Final    BOTTLES DRAWN AEROBIC AND ANAEROBIC  ANA 15ML AER 9ML   Culture NO GROWTH 5 DAYS  Final   Report Status 11/12/2016 FINAL  Final  Blood culture (routine x 2)     Status: None   Collection Time: 11/07/16  3:21 AM  Result Value Ref Range Status   Specimen Description BLOOD  LEFT AC  Final   Special Requests   Final    BOTTLES DRAWN AEROBIC AND ANAEROBIC  ANA 10ML AER 11ML   Culture NO GROWTH 5 DAYS  Final   Report Status 11/12/2016 FINAL  Final  MRSA PCR Screening     Status: None  Collection Time: 11/07/16  6:19 AM  Result Value Ref Range Status   MRSA by PCR NEGATIVE NEGATIVE Final    Comment:        The GeneXpert MRSA Assay (FDA approved for NASAL specimens only), is one component of a comprehensive MRSA colonization surveillance program. It is not intended to diagnose MRSA infection nor to guide or monitor treatment for MRSA infections.     RADIOLOGY:  No results found.  Follow up with PCP in 1 week.  Management plans discussed with the patient, family and they are in agreement.  CODE STATUS:  Code Status History    Date Active Date Inactive Code Status Order ID Comments User Context   11/07/2016  6:18 AM 11/11/2016  2:39 PM Full Code [id number]  Harrie Foreman, MD Inpatient   06/21/2016 11:52 AM 06/22/2016  3:07 PM Full Code [id number]  [name], MD Inpatient      TOTAL TIME TAKING CARE OF THIS PATIENT ON DAY OF DISCHARGE: more than 30 minutes.   Hillary Bow R M.D on 11/18/2016 at 2:55 AM  Between 7am to 6pm - Pager - [phone]  After 6pm go to [email or web address] - password [email or web address]  Office  [phone]  CC: Primary care physician; Donnie Coffin, MD  Note: This dictation was prepared with Dragon dictation along with smaller phrase technology. Any transcriptional errors that result from this process are unintentional.

## 2016-11-19 ENCOUNTER — Other Ambulatory Visit: Payer: Self-pay | Admitting: Hematology and Oncology

## 2016-11-20 ENCOUNTER — Telehealth: Payer: Self-pay | Admitting: *Deleted

## 2016-11-20 DIAGNOSIS — C770 Secondary and unspecified malignant neoplasm of lymph nodes of head, face and neck: Secondary | ICD-10-CM

## 2016-11-20 DIAGNOSIS — C3492 Malignant neoplasm of unspecified part of left bronchus or lung: Secondary | ICD-10-CM

## 2016-11-20 MED ORDER — MEGESTROL ACETATE 40 MG/ML PO SUSP: 400.0000 mg | Freq: Two times a day (BID) | ORAL | 1 refills | Status: DC

## 2016-11-20 NOTE — Telephone Encounter (Signed)
Called pt and let her know that we can r/s her appt for 12/4 10 am for labs and 10:15 for md visit. She is agreeable to the appts. And refilled the megace

## 2016-11-20 NOTE — Telephone Encounter (Signed)
-----   Message from Cephus Richer sent at 11/19/2016 10:55 AM EST ----- Pt need refill on megestrol .Pt just discharged from hospital. Pt need to know when she need to return.

## 2016-11-21 NOTE — ED Provider Notes (Signed)
[facility] Emergency Department Provider Note    First MD Initiated Contact with Patient 11/07/16 0259     (approximate)  I have reviewed the triage vital signs and the nursing notes.   HISTORY  Chief Complaint Weakness   HPI Audrey Mcgee is a 55 y.o. female with Melissa chronic medical conditions including lung cancer last chemotherapy performed on Monday presents to the emergency department with increasing shortness of breath and generalized weakness. Patient denies any fever afebrile on presentation were temperature 97.5. Patient however noted to be tachycardic and tachypnea, arrival. Patient denies any chest pain   Past Medical History:  Diagnosis Date  . COPD (chronic obstructive pulmonary disease) ([place])   . Hypertension   . Lung cancer ([place])   . Thyroid disease     Patient Active Problem List   Diagnosis Date Noted  . Sepsis ([place]) 11/07/2016  . Protein-calorie malnutrition, severe 11/07/2016  . Acute respiratory distress 06/21/2016  . COPD exacerbation ([place]) 06/21/2016  . Sinus tachycardia 06/21/2016  . Recurrent aspiration bronchitis/pneumonia ([place]) 06/21/2016  . Dysphagia 06/21/2016  . Nausea and vomiting 06/21/2016  . Dental abscess 05/10/2016  . Swallowing difficulty 03/28/2016  . Hypomagnesemia 02/29/2016  . Hoarseness 02/18/2016  . Cancer related pain 07/08/2015  . Adenocarcinoma, lung ([place]) 03/26/2015  . 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 Ear ache 10/03/2014  . Abscess of buccal cavity 10/03/2014  . Lump in neck 10/03/2014  . Laryngeal pain 10/03/2014    Past Surgical History:  Procedure Laterality Date  . CESAREAN SECTION CLASSICAL    . STOMACH SURGERY     Pt reports for a tumor    Prior to Admission medications   Medication Sig Start Date End Date Taking? Authorizing Provider  benzonatate (TESSALON) 100 MG capsule Take 1 capsule (100 mg total) by mouth 3 (three) times daily as  needed for cough. 09/24/15  Yes Lequita Asal, MD  dexamethasone (DECADRON) 4 MG tablet Take '4mg'$  BID the day before and day after chemotherapy. Patient taking differently: Take 4 mg by mouth 2 (two) times daily. Take '4mg'$  BID the day before and day after chemotherapy. 10/31/16  Yes Lequita Asal, MD  docusate sodium (COLACE) 100 MG capsule Take 100 mg by mouth 2 (two) times daily as needed for mild constipation.   Yes Historical Provider, MD  folic acid (FOLVITE) 914 MCG tablet Take 800 mcg by mouth daily.    Yes Historical Provider, MD  lidocaine (XYLOCAINE) 2 % solution Use as directed 10 mLs in the mouth or throat every 4 (four) hours as needed for mouth pain.   Yes Historical Provider, MD  losartan (COZAAR) 100 MG tablet Take 1 tablet (100 mg total) by mouth daily. 06/22/16  Yes [name], MD  magic mouthwash w/lidocaine SOLN Take 5 mLs by mouth 4 (four) times daily as needed for mouth pain. 08/31/15  Yes Lequita Asal, MD  magnesium oxide (MAG-OX) 400 MG tablet Take 1 tablet (400 mg total) by mouth daily. 04/17/16  Yes Lequita Asal, MD  methimazole (TAPAZOLE) 5 MG tablet take 1 tablet by mouth twice a day for OVERACTIVE THYROID 08/08/16  Yes Historical Provider, MD  metoprolol (LOPRESSOR) 50 MG tablet take 1 tablet by mouth twice a day NOTE DOSE INCREASE 08/07/16  Yes Historical Provider, MD  morphine (MS CONTIN) 15 MG 12 hr tablet Take 1 tablet (15 mg total) by mouth 2 (two) times daily. 10/31/16  Yes Lequita Asal,  MD  omeprazole (PRILOSEC) 20 MG capsule Take 1 capsule (20 mg total) by mouth daily. 06/20/16  Yes Lequita Asal, MD  ondansetron (ZOFRAN) 8 MG tablet Take 1 tablet (8 mg total) by mouth 2 (two) times daily as needed for nausea or vomiting. 08/22/16  Yes Lequita Asal, MD  Oxycodone HCl 10 MG TABS Take 1 tablet (10 mg total) by mouth every 4 (four) hours as needed (pain). 10/31/16  Yes Lequita Asal, MD  tiotropium (SPIRIVA) 18 MCG inhalation  capsule Place 1 capsule (18 mcg total) into inhaler and inhale daily. 06/22/16  Yes [name], MD  levofloxacin (LEVAQUIN) 750 MG tablet Take 1 tablet (750 mg total) by mouth daily. 11/11/16   Srikar Sudini, MD  megestrol (MEGACE) 40 MG/ML suspension Take 10 mLs (400 mg total) by mouth 2 (two) times daily. 11/20/16   Lequita Asal, MD  predniSONE (DELTASONE) 20 MG tablet Take 2 tablets (40 mg total) by mouth daily with breakfast. 11/11/16   Hillary Bow, MD    Allergies No known allergies  Family History  Problem Relation Age of Onset  . Hypertension Father   . Cancer Father   . Diabetes Father   . Cancer Maternal Aunt     Social History Social History  Substance Use Topics  . Smoking status: Former Smoker    Years: 15.00    Types: Cigarettes  . Smokeless tobacco: Never Used  . Alcohol use No    Review of Systems Constitutional: No fever/chills Eyes: No visual changes. ENT: No sore throat. Cardiovascular: Denies chest pain. Respiratory: Positive for shortness of breath. Gastrointestinal: No abdominal pain.  No nausea, no vomiting.  No diarrhea.  No constipation. Genitourinary: Negative for dysuria. Musculoskeletal: Negative for back pain. Skin: Negative for rash. Neurological: Negative for headaches, focal weakness or numbness. Positive for generalized weakness  10-point ROS otherwise negative.  ____________________________________________   PHYSICAL EXAM:  VITAL SIGNS: ED Triage Vitals  Enc Vitals Group     BP 11/07/16 0251 106/77     Pulse Rate 11/07/16 0251 (!) 131     Resp 11/07/16 0251 20     Temp 11/07/16 0251 97.5 F (36.4 C)     Temp Source 11/07/16 0251 Oral     SpO2 11/07/16 0251 94 %     Weight 11/07/16 0252 125 lb (56.7 kg)     Height 11/07/16 0823 '5\' 2"'$  (1.575 m)     Head Circumference --      Peak Flow --      Pain Score 11/07/16 0252 7     Pain Loc --      Pain Edu? --      Excl. in [place]? --     Constitutional: Alert and  oriented. Well appearing and in no acute distress. Eyes: Conjunctivae are normal. PERRL. EOMI. Head: Atraumatic. Mouth/Throat: Mucous membranes are Dry.  Oropharynx non-erythematous. Neck: No stridor.  No meningeal signs.  Cardiovascular: Tachycardia, regular rhythm. Good peripheral circulation. Grossly normal heart sounds. Respiratory: Normal respiratory effort.  No retractions. Diffuse rhonchi noted Gastrointestinal: Soft and nontender. No distention.  Musculoskeletal: No lower extremity tenderness nor edema. No gross deformities of extremities. Neurologic:  Normal speech and language. No gross focal neurologic deficits are appreciated.  Skin:  Skin is warm, dry and intact. No rash noted. Psychiatric: Mood and affect are normal. Speech and behavior are normal.  ____________________________________________   LABS (all labs ordered are listed, but only abnormal results are displayed)  Labs Reviewed  CBC - Abnormal; Notable for the following:       Result Value   WBC 1.8 (*)    RDW 15.2 (*)    Platelets 145 (*)    All other components within normal limits  COMPREHENSIVE METABOLIC PANEL - Abnormal; Notable for the following:    Sodium 130 (*)    Potassium 2.6 (*)    Chloride 91 (*)    Creatinine, Ser 1.04 (*)    Calcium 8.5 (*)    ALT 11 (*)    Total Bilirubin 2.5 (*)    All other components within normal limits  URINALYSIS COMPLETEWITH MICROSCOPIC ([facility] ONLY) - Abnormal; Notable for the following:    Color, Urine AMBER (*)    APPearance CLEAR (*)    Ketones, ur TRACE (*)    Protein, ur 30 (*)    Bacteria, UA RARE (*)    Squamous Epithelial / LPF 0-5 (*)    All other components within normal limits  TSH - Abnormal; Notable for the following:    TSH 33.139 (*)    All other components within normal limits  BASIC METABOLIC PANEL - Abnormal; Notable for the following:    Sodium 133 (*)    Chloride 100 (*)    Calcium 7.3 (*)    All other components within normal limits    MAGNESIUM - Abnormal; Notable for the following:    Magnesium 1.5 (*)    All other components within normal limits  BASIC METABOLIC PANEL - Abnormal; Notable for the following:    Sodium 134 (*)    Potassium 3.3 (*)    Calcium 7.5 (*)    All other components within normal limits  CBC - Abnormal; Notable for the following:    WBC 2.5 (*)    RBC 3.73 (*)    MCH 34.1 (*)    RDW 14.9 (*)    Platelets 109 (*)    All other components within normal limits  BASIC METABOLIC PANEL - Abnormal; Notable for the following:    Glucose, Bld 62 (*)    BUN <5 (*)    Calcium 7.2 (*)    All other components within normal limits  CBC WITH DIFFERENTIAL/PLATELET - Abnormal; Notable for the following:    RBC 3.41 (*)    Hemoglobin 11.5 (*)    HCT 33.3 (*)    RDW 15.0 (*)    Platelets 116 (*)    Lymphs Abs 0.9 (*)    All other components within normal limits  CBC - Abnormal; Notable for the following:    RBC 3.58 (*)    MCH 34.3 (*)    RDW 15.3 (*)    All other components within normal limits  BASIC METABOLIC PANEL - Abnormal; Notable for the following:    Sodium 134 (*)    CO2 21 (*)    Glucose, Bld 199 (*)    Calcium 7.0 (*)    All other components within normal limits  CULTURE, BLOOD (ROUTINE X 2)  CULTURE, BLOOD (ROUTINE X 2)  MRSA PCR SCREENING  LACTIC ACID, PLASMA  MAGNESIUM   ____________________________________________  EKG  ED ECG REPORT I, [place] Jailyn Langhorst, the attending physician, personally viewed and interpreted this ECG.   Date: 11/21/2016  EKG Time: 3:19 AM  Rate: 128  Rhythm: Sinus tachycardia  Axis: Normal  Intervals: Normal  ST&T Change: None  ____________________________________________  RADIOLOGY I, [place] N Krisa Blattner, personally viewed and evaluated these images (plain radiographs) as part of my  medical decision making, as well as reviewing the written report by the radiologist.  CLINICAL DATA:  Increasing dyspnea. Generalized weakness.  Recent chemotherapy for lung cancer.  EXAM: CHEST  2 VIEW  COMPARISON:  06/21/2016  FINDINGS: There is stable mild left hemidiaphragm elevation. The lungs are clear. There is no pleural effusion. Heart size is normal and unchanged. Pulmonary vasculature is normal.  IMPRESSION: No acute findings.   Electronically Signed   By: Andreas Newport M.D.   On: 11/07/2016 03:43 ____________________________________________  Procedures   Critical Care performed: CRITICAL CARE Performed by: Gregor Hams   Total critical care time: 30 minutes  Critical care time was exclusive of separately billable procedures and treating other patients.  Critical care was necessary to treat or prevent imminent or life-threatening deterioration.  Critical care was time spent personally by me on the following activities: development of treatment plan with patient and/or surrogate as well as nursing, discussions with consultants, evaluation of patient's response to treatment, examination of patient, obtaining history from patient or surrogate, ordering and performing treatments and interventions, ordering and review of laboratory studies, ordering and review of radiographic studies, pulse oximetry and re-evaluation of patient's condition.   INITIAL IMPRESSION / ASSESSMENT AND PLAN / ED COURSE  Pertinent labs & imaging results that were available during my care of the patient were reviewed by me and considered in my medical decision making (see chart for details).  History of physical exam concern for possible sepsis as such patient received 30 months per kilogram of IV normal saline and IV broad-spectrum antibiotic.   Clinical Course     ____________________________________________  FINAL CLINICAL IMPRESSION(S) / ED DIAGNOSES  Final diagnoses:  HCAP (healthcare-associated pneumonia)     MEDICATIONS GIVEN DURING THIS VISIT:  Medications  potassium chloride SA (K-DUR,KLOR-CON)  CR tablet 40 mEq (40 mEq Oral Not Given 11/07/16 1340)  sodium chloride 0.9 % bolus 1,000 mL (0 mLs Intravenous Stopped 11/07/16 0746)  sodium chloride 0.9 % bolus 1,000 mL (1,000 mLs Intravenous Given 11/07/16 0658)  ondansetron (ZOFRAN) injection 4 mg (4 mg Intravenous Given 11/07/16 0349)  levofloxacin (LEVAQUIN) IVPB 500 mg (0 mg Intravenous Stopped 11/07/16 0450)  potassium chloride SA (K-DUR,KLOR-CON) CR tablet 40 mEq (40 mEq Oral Given 11/07/16 0437)  potassium chloride 30 mEq in sodium chloride 0.9 % 265 mL (KCL MULTIRUN) IVPB (30 mEq Intravenous Given 11/07/16 1053)  Levofloxacin (LEVAQUIN) IVPB 250 mg (250 mg Intravenous Given 11/07/16 1736)  magnesium sulfate IVPB 1 g 100 mL (1 g Intravenous Given 11/07/16 2128)  potassium chloride 30 mEq in sodium chloride 0.9 % 265 mL (KCL MULTIRUN) IVPB (30 mEq Intravenous Given 11/08/16 1240)     NEW OUTPATIENT MEDICATIONS STARTED DURING THIS VISIT:  Discharge Medication List as of 11/11/2016 10:23 AM    START taking these medications   Details  levofloxacin (LEVAQUIN) 750 MG tablet Take 1 tablet (750 mg total) by mouth daily., Starting Tue 11/11/2016, Normal    predniSONE (DELTASONE) 20 MG tablet Take 2 tablets (40 mg total) by mouth daily with breakfast., Starting Tue 11/11/2016, Normal        Discharge Medication List as of 11/11/2016 10:23 AM      Discharge Medication List as of 11/11/2016 10:23 AM       Note:  This document was prepared using Dragon voice recognition software and may include unintentional dictation errors.    Gregor Hams, MD 11/21/16 2255

## 2016-11-24 ENCOUNTER — Inpatient Hospital Stay: Payer: Medicaid Other | Attending: Hematology and Oncology

## 2016-11-24 ENCOUNTER — Inpatient Hospital Stay: Payer: Medicaid Other | Admitting: Hematology and Oncology

## 2016-11-24 NOTE — Progress Notes (Deleted)
[facility] day:  11/24/2016   Chief Complaint: Audrey Mcgee is a 55 y.o. female with metastatic poorly differentiated non-small cell lung cancer who is seen for reassessment after 6 cycles of Alimta and interval hospitalization.  HPI: The patient was last seen in the medical oncology clinic by me on 10/31/2016.  At that time, she felt good except for a little headache and shoulder pain.  She had been sick with the flu the month prior to her visit.  She needed to reschedule her appointment with ENT and CT scan to further evaluate her left vocal cord paralysis.  She received her Alimta uneventfully.  Restaging PET scan was ordered.  We discussed her increased creatinine at last visit.  She was encouraged to drink fluids.  She was admitted to [facility] from 11/07/2016 - 11/11/2016 with weakness and tachypnea.  She described poor oral intake x 3-4 days secondary to nausea and vomiting.  CXR on 11/09/2016 revealed developing right and possible left sided airspace opacities suspicious for pneumonia.  She was treated with Levaquin.  CBC on 11/07/2016 revealed a hematocrit of 44.7, hemoglobin 15.7, MCV 96, platelets 145,000, white count 1800.  Creatinine was 1.04. Acid was 2.6 lactic acid was 1.6.  CBC on 11/10/2016 included a hematocrit of 35.2, hemoglobin 12.3, MCV 98.5, platelets 154,000, white count 7400.   Creatinine was 0.79.  Symptomatically,  Past Medical History:  Diagnosis Date  . COPD (chronic obstructive pulmonary disease) ([place])   . Hypertension   . Lung cancer ([place])   . Thyroid disease     Past Surgical History:  Procedure Laterality Date  . CESAREAN SECTION CLASSICAL    . STOMACH SURGERY     Pt reports for a tumor    Family History  Problem Relation Age of Onset  . Hypertension Father   . Cancer Father   . Diabetes Father   . Cancer Maternal Aunt     Social History:  reports that she has quit smoking. Her smoking use  included Cigarettes. She quit after 15.00 years of use. She has never used smokeless tobacco. She reports that she does not drink alcohol or use drugs.  She stopped smoking.  Her nephew shot in [place] (caused delays in her treatment).  Her boyfriend's father  died of metastatic lung cancer.  Her boyfriend works at a school with children.  The patient's 1st cousin died (age 62) of a cerebral aneurysm.  The patient is alone today.    Allergies:  Allergies  Allergen Reactions  . No Known Allergies     Current Medications: Current Outpatient Prescriptions  Medication Sig Dispense Refill  . benzonatate (TESSALON) 100 MG capsule Take 1 capsule (100 mg total) by mouth 3 (three) times daily as needed for cough. 20 capsule 0  . dexamethasone (DECADRON) 4 MG tablet Take 2m BID the day before and day after chemotherapy. (Patient taking differently: Take 4 mg by mouth 2 (two) times daily. Take 427mBID the day before and day after chemotherapy.) 20 tablet 0  . docusate sodium (COLACE) 100 MG capsule Take 100 mg by mouth 2 (two) times daily as needed for mild constipation.    . folic acid (FOLVITE) 80709CG tablet Take 800 mcg by mouth daily.     . Marland Kitchenevofloxacin (LEVAQUIN) 750 MG tablet Take 1 tablet (750 mg total) by mouth daily. 4 tablet 0  . lidocaine (XYLOCAINE) 2 % solution Use as directed 10 mLs in the mouth  or throat every 4 (four) hours as needed for mouth pain.    [name] losartan (COZAAR) 100 MG tablet Take 1 tablet (100 mg total) by mouth daily. 30 tablet 0  . magic mouthwash w/lidocaine SOLN Take 5 mLs by mouth 4 (four) times daily as needed for mouth pain. 480 mL 1  . magnesium oxide (MAG-OX) 400 MG tablet Take 1 tablet (400 mg total) by mouth daily. 30 tablet 0  . megestrol (MEGACE) 40 MG/ML suspension Take 10 mLs (400 mg total) by mouth 2 (two) times daily. 300 mL 1  . methimazole (TAPAZOLE) 5 MG tablet take 1 tablet by mouth twice a day for OVERACTIVE THYROID  0  . metoprolol (LOPRESSOR) 50 MG  tablet take 1 tablet by mouth twice a day NOTE DOSE INCREASE  0  . morphine (MS CONTIN) 15 MG 12 hr tablet Take 1 tablet (15 mg total) by mouth 2 (two) times daily. 30 tablet 0  . omeprazole (PRILOSEC) 20 MG capsule Take 1 capsule (20 mg total) by mouth daily. 30 capsule 3  . ondansetron (ZOFRAN) 8 MG tablet Take 1 tablet (8 mg total) by mouth 2 (two) times daily as needed for nausea or vomiting. 30 tablet 1  . Oxycodone HCl 10 MG TABS Take 1 tablet (10 mg total) by mouth every 4 (four) hours as needed (pain). 30 tablet 0  . predniSONE (DELTASONE) 20 MG tablet Take 2 tablets (40 mg total) by mouth daily with breakfast. 8 tablet 0  . tiotropium (SPIRIVA) 18 MCG inhalation capsule Place 1 capsule (18 mcg total) into inhaler and inhale daily. 30 capsule 1   No current facility-administered medications for this visit.     Review of Systems:  GENERAL: Feels "good". No fevers or chills. Weight stable. PERFORMANCE STATUS (ECOG): 1 HEENT: Chronic hoarseness (see HPI).  No sore throat, mouth sores or tenderness. Lungs:  Shortness of breath with exertion (stable).  Chronic cough.  No hemoptysis. Cardiac: No chest pain, palpitations, orthopnea, or PND. GI: Nausea x 3 days post chemotherapy.  No vomiting, diarrhea, constipation, melena or hematochezia. GU: No urgency, frequency, dysuria, or hematuria. Musculoskeletal: Right shoulder pain.No muscle tenderness. Extremities: No pain or swelling. Skin: No rashes or skin changes. Neuro: Headache (rare).  No numbness or weakness, balance or coordination issues. Endocrine: No diabetes.  Thyroid issues.  No hot flashes or night sweats. Psych: No mood changes, depression or anxiety. Pain: Pain in shoulder (stable). Review of systems: All other systems reviewed and found to be negative.  Physical Exam: There were no vitals taken for this visit.  GENERAL: Thin woman sitting comfortably in the exam room in no acute distress. MENTAL STATUS:  Alert and oriented to person, place and time. HEAD: Short dark styled wig Normocephalic, atraumatic, no Cushingoid features.  EYES: Brown eyes. Ruddy sclera.  Pupils equal round and reactive to light and accomodation. No conjunctivitis or scleral icterus. ENT: Hoarse.  Poor dentition.  Oropharynx clear without lesion. Tongue normal. Mucous membranes moist.  RESPIRATORY: Clear to auscultation without rales, wheezes or rhonchi. CARDIOVASCULAR: Regular rate and rhythm without murmur, rub or gallop. ABDOMEN: Soft, non-tender, with active bowel sounds, and no hepatosplenomegaly. No masses. SKIN: No rashes, ulcers or lesions. EXTREMITIES: No edema, skin discoloration or tenderness. No palpable cords. LYMPH NODES: No palpable cervical, supraclavicular, axillary or inguinal adenopathy  NEUROLOGICAL: Unremarkable. PSYCH: Appropriate.   No visits with results within 3 Day(s) from this visit.  Latest known visit with results is:  Admission on 11/07/2016, Discharged  on 11/11/2016  Component Date Value Ref Range Status  . WBC 11/07/2016 1.8* 3.6 - 11.0 K/uL Final  . RBC 11/07/2016 4.66  3.80 - 5.20 MIL/uL Final  . Hemoglobin 11/07/2016 15.7  12.0 - 16.0 g/dL Final  . HCT 11/07/2016 44.7  35.0 - 47.0 % Final  . MCV 11/07/2016 96.0  80.0 - 100.0 fL Final  . MCH 11/07/2016 33.6  26.0 - 34.0 pg Final  . MCHC 11/07/2016 35.0  32.0 - 36.0 g/dL Final  . RDW 11/07/2016 15.2* 11.5 - 14.5 % Final  . Platelets 11/07/2016 145* 150 - 440 K/uL Final  . Sodium 11/07/2016 130* 135 - 145 mmol/L Final  . Potassium 11/07/2016 2.6* 3.5 - 5.1 mmol/L Final   Comment: CRITICAL RESULT CALLED TO, READ BACK BY AND VERIFIED WITH BRIAN GRAY ON 11/07/16 AT 0418 BY TLB   . Chloride 11/07/2016 91* 101 - 111 mmol/L Final  . CO2 11/07/2016 25  22 - 32 mmol/L Final  . Glucose, Bld 11/07/2016 92  65 - 99 mg/dL Final  . BUN 11/07/2016 11  6 - 20 mg/dL Final  . Creatinine, Ser 11/07/2016 1.04* 0.44 - 1.00 mg/dL  Final  . Calcium 11/07/2016 8.5* 8.9 - 10.3 mg/dL Final  . Total Protein 11/07/2016 7.5  6.5 - 8.1 g/dL Final  . Albumin 11/07/2016 3.5  3.5 - 5.0 g/dL Final  . AST 11/07/2016 29  15 - 41 U/L Final  . ALT 11/07/2016 11* 14 - 54 U/L Final  . Alkaline Phosphatase 11/07/2016 39  38 - 126 U/L Final  . Total Bilirubin 11/07/2016 2.5* 0.3 - 1.2 mg/dL Final  . GFR calc non Af Amer 11/07/2016 >60  >60 mL/min Final  . GFR calc Af Amer 11/07/2016 >60  >60 mL/min Final   Comment: (NOTE) The eGFR has been calculated using the CKD EPI equation. This calculation has not been validated in all clinical situations. eGFR's persistently <60 mL/min signify possible Chronic Kidney Disease.   . Anion gap 11/07/2016 14  5 - 15 Final  . Color, Urine 11/07/2016 AMBER* YELLOW Final  . APPearance 11/07/2016 CLEAR* CLEAR Final  . Glucose, UA 11/07/2016 NEGATIVE  NEGATIVE mg/dL Final  . Bilirubin Urine 11/07/2016 NEGATIVE  NEGATIVE Final  . Ketones, ur 11/07/2016 TRACE* NEGATIVE mg/dL Final  . Specific Gravity, Urine 11/07/2016 1.018  1.005 - 1.030 Final  . Hgb urine dipstick 11/07/2016 NEGATIVE  NEGATIVE Final  . pH 11/07/2016 5.0  5.0 - 8.0 Final  . Protein, ur 11/07/2016 30* NEGATIVE mg/dL Final  . Nitrite 11/07/2016 NEGATIVE  NEGATIVE Final  . Leukocytes, UA 11/07/2016 NEGATIVE  NEGATIVE Final  . RBC / HPF 11/07/2016 0-5  0 - 5 RBC/hpf Final  . WBC, UA 11/07/2016 0-5  0 - 5 WBC/hpf Final  . Bacteria, UA 11/07/2016 RARE* NONE SEEN Final  . Squamous Epithelial / LPF 11/07/2016 0-5* NONE SEEN Final  . Mucous 11/07/2016 PRESENT   Final  . Specimen Description 11/12/2016 BLOOD LEFT ARM   Final  . Special Requests 11/12/2016 BOTTLES DRAWN AEROBIC AND ANAEROBIC  ANA 15ML AER 9ML   Final  . Culture 11/12/2016 NO GROWTH 5 DAYS   Final  . Report Status 11/12/2016 11/12/2016 FINAL   Final  . Specimen Description 11/12/2016 BLOOD  LEFT AC   Final  . Special Requests 11/12/2016 BOTTLES DRAWN AEROBIC AND ANAEROBIC   ANA 10ML AER 11ML   Final  . Culture 11/12/2016 NO GROWTH 5 DAYS   Final  . Report Status  11/12/2016 11/12/2016 FINAL   Final  . Lactic Acid, Venous 11/07/2016 1.6  0.5 - 1.9 mmol/L Final  . MRSA by PCR 11/07/2016 NEGATIVE  NEGATIVE Final   Comment:        The GeneXpert MRSA Assay (FDA approved for NASAL specimens only), is one component of a comprehensive MRSA colonization surveillance program. It is not intended to diagnose MRSA infection nor to guide or monitor treatment for MRSA infections.   [name] TSH 11/07/2016 33.139* 0.350 - 4.500 uIU/mL Final  . Sodium 11/07/2016 133* 135 - 145 mmol/L Final  . Potassium 11/07/2016 3.6  3.5 - 5.1 mmol/L Final  . Chloride 11/07/2016 100* 101 - 111 mmol/L Final  . CO2 11/07/2016 25  22 - 32 mmol/L Final  . Glucose, Bld 11/07/2016 94  65 - 99 mg/dL Final  . BUN 11/07/2016 8  6 - 20 mg/dL Final  . Creatinine, Ser 11/07/2016 0.89  0.44 - 1.00 mg/dL Final  . Calcium 11/07/2016 7.3* 8.9 - 10.3 mg/dL Final  . GFR calc non Af Amer 11/07/2016 >60  >60 mL/min Final  . GFR calc Af Amer 11/07/2016 >60  >60 mL/min Final   Comment: (NOTE) The eGFR has been calculated using the CKD EPI equation. This calculation has not been validated in all clinical situations. eGFR's persistently <60 mL/min signify possible Chronic Kidney Disease.   . Anion gap 11/07/2016 8  5 - 15 Final  . Magnesium 11/07/2016 1.5* 1.7 - 2.4 mg/dL Final  . Sodium 11/08/2016 134* 135 - 145 mmol/L Final  . Potassium 11/08/2016 3.3* 3.5 - 5.1 mmol/L Final  . Chloride 11/08/2016 103  101 - 111 mmol/L Final  . CO2 11/08/2016 24  22 - 32 mmol/L Final  . Glucose, Bld 11/08/2016 95  65 - 99 mg/dL Final  . BUN 11/08/2016 6  6 - 20 mg/dL Final  . Creatinine, Ser 11/08/2016 0.80  0.44 - 1.00 mg/dL Final  . Calcium 11/08/2016 7.5* 8.9 - 10.3 mg/dL Final  . GFR calc non Af Amer 11/08/2016 >60  >60 mL/min Final  . GFR calc Af Amer 11/08/2016 >60  >60 mL/min Final   Comment: (NOTE) The eGFR has  been calculated using the CKD EPI equation. This calculation has not been validated in all clinical situations. eGFR's persistently <60 mL/min signify possible Chronic Kidney Disease.   . Anion gap 11/08/2016 7  5 - 15 Final  . WBC 11/08/2016 2.5* 3.6 - 11.0 K/uL Final  . RBC 11/08/2016 3.73* 3.80 - 5.20 MIL/uL Final  . Hemoglobin 11/08/2016 12.7  12.0 - 16.0 g/dL Final  . HCT 11/08/2016 36.4  35.0 - 47.0 % Final  . MCV 11/08/2016 97.5  80.0 - 100.0 fL Final  . MCH 11/08/2016 34.1* 26.0 - 34.0 pg Final  . MCHC 11/08/2016 35.0  32.0 - 36.0 g/dL Final  . RDW 11/08/2016 14.9* 11.5 - 14.5 % Final  . Platelets 11/08/2016 109* 150 - 440 K/uL Final  . Sodium 11/09/2016 135  135 - 145 mmol/L Final  . Potassium 11/09/2016 3.6  3.5 - 5.1 mmol/L Final  . Chloride 11/09/2016 106  101 - 111 mmol/L Final  . CO2 11/09/2016 23  22 - 32 mmol/L Final  . Glucose, Bld 11/09/2016 62* 65 - 99 mg/dL Final  . BUN 11/09/2016 <5* 6 - 20 mg/dL Final  . Creatinine, Ser 11/09/2016 0.75  0.44 - 1.00 mg/dL Final  . Calcium 11/09/2016 7.2* 8.9 - 10.3 mg/dL Final  . GFR calc non Af Wyvonnia Lora  11/09/2016 >60  >60 mL/min Final  . GFR calc Af Amer 11/09/2016 >60  >60 mL/min Final   Comment: (NOTE) The eGFR has been calculated using the CKD EPI equation. This calculation has not been validated in all clinical situations. eGFR's persistently <60 mL/min signify possible Chronic Kidney Disease.   . Anion gap 11/09/2016 6  5 - 15 Final  . WBC 11/09/2016 4.7  3.6 - 11.0 K/uL Final  . RBC 11/09/2016 3.41* 3.80 - 5.20 MIL/uL Final  . Hemoglobin 11/09/2016 11.5* 12.0 - 16.0 g/dL Final  . HCT 11/09/2016 33.3* 35.0 - 47.0 % Final  . MCV 11/09/2016 97.8  80.0 - 100.0 fL Final  . MCH 11/09/2016 33.9  26.0 - 34.0 pg Final  . MCHC 11/09/2016 34.6  32.0 - 36.0 g/dL Final  . RDW 11/09/2016 15.0* 11.5 - 14.5 % Final  . Platelets 11/09/2016 116* 150 - 440 K/uL Final  . Neutrophils Relative % 11/09/2016 69  % Final  . Neutro Abs  11/09/2016 3.2  1.4 - 6.5 K/uL Final  . Lymphocytes Relative 11/09/2016 19  % Final  . Lymphs Abs 11/09/2016 0.9* 1.0 - 3.6 K/uL Final  . Monocytes Relative 11/09/2016 12  % Final  . Monocytes Absolute 11/09/2016 0.6  0.2 - 0.9 K/uL Final  . Eosinophils Relative 11/09/2016 0  % Final  . Eosinophils Absolute 11/09/2016 0.0  0 - 0.7 K/uL Final  . Basophils Relative 11/09/2016 0  % Final  . Basophils Absolute 11/09/2016 0.0  0 - 0.1 K/uL Final  . Magnesium 11/09/2016 1.9  1.7 - 2.4 mg/dL Final  . WBC 11/10/2016 7.4  3.6 - 11.0 K/uL Final  . RBC 11/10/2016 3.58* 3.80 - 5.20 MIL/uL Final  . Hemoglobin 11/10/2016 12.3  12.0 - 16.0 g/dL Final  . HCT 11/10/2016 35.2  35.0 - 47.0 % Final  . MCV 11/10/2016 98.5  80.0 - 100.0 fL Final  . MCH 11/10/2016 34.3* 26.0 - 34.0 pg Final  . MCHC 11/10/2016 34.8  32.0 - 36.0 g/dL Final  . RDW 11/10/2016 15.3* 11.5 - 14.5 % Final  . Platelets 11/10/2016 154  150 - 440 K/uL Final  . Sodium 11/10/2016 134* 135 - 145 mmol/L Final  . Potassium 11/10/2016 3.9  3.5 - 5.1 mmol/L Final  . Chloride 11/10/2016 106  101 - 111 mmol/L Final  . CO2 11/10/2016 21* 22 - 32 mmol/L Final  . Glucose, Bld 11/10/2016 199* 65 - 99 mg/dL Final  . BUN 11/10/2016 6  6 - 20 mg/dL Final  . Creatinine, Ser 11/10/2016 0.79  0.44 - 1.00 mg/dL Final  . Calcium 11/10/2016 7.0* 8.9 - 10.3 mg/dL Final  . GFR calc non Af Amer 11/10/2016 >60  >60 mL/min Final  . GFR calc Af Amer 11/10/2016 >60  >60 mL/min Final   Comment: (NOTE) The eGFR has been calculated using the CKD EPI equation. This calculation has not been validated in all clinical situations. eGFR's persistently <60 mL/min signify possible Chronic Kidney Disease.   . Anion gap 11/10/2016 7  5 - 15 Final    Assessment:  Danissa Rundle is a 55 y.o. female with metastatic poorly differentiated adenocarcinoma of lung. She presented with a 5 month history of enlarging bilateral neck masses, progressive hoarseness,  dysphagia, dyspnea on exertion, cough, left sided hearing loss, neck pain, and a 30 pound weight loss.  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 Biopsy of the right supraclavicular lymph node on 10/03/2014 revealed  poorly differentiated non-small cell carcinoma, favoring adenocarcinoma consistent with lung primary (TTF-1 positive). KRAS was positive.   PET scan on 10/24/2014 revealed a left hilar mass with associated left upper lobe obstruction/atelectasis with extensive bilateral mediastinal and cervical adenopathy. Chest CT from 10/10/2014 also noted a 2.2 x 0.9 cm soft tissue density with internal calcifications in the right breast. Head MRI revealed no evidence of metastatic disease.  She received palliative radiation (3000 cGy) to the left lung from 10/24/2014 - 11/07/2014. She received 6 cycles carboplatin and Alimta (11/14/2014 - 04/30/2015). There was a delay between cycle #2 and cycle #3 secondary to a switch in caregivers.  She was diagnosed with a left peri-hilar infiltrate on 02/23/2015. She has completed a course of Levaquin. Chest CT on 03/21/2015 revealed LUL scarring, patchy ill defined airspace opacities in the lingula, upper esophageal thickening, and small mediastinal and hilar nodes. She completed a course of Azithromycin.  She received 2 cycles of maintenance Alimta (05/22/2015 - 06/15/2015). Chest CT on 07/09/2015 revealed patchy ground glass and nodular consolidation in the left upper lobe and to a lesser extent left lower lobe, minimally progressive from 03/21/2015.  Bone scan on 07/09/2015 revealed no evidence of metastatic disease.  CXR on 09/24/2015 revealed patchy left perihilar airspace opacities.  She was treated with azithromycin.  Chest CT on 10/17/2015 revealed patchy ground-glass and ill-defined nodular areas of opacity in the left apex, lingula, and anterior left lower lobe generally appeared decreased in the interval  although 1 of the nodular areas in the left apex has progressed slightly since the previous study.  Overall imaging features are felt to be most likely treatment related.  There had been an interval slight decrease in proximal to mid esophageal circumferential wall thickening, potentially treatment related.   She was lost to follow-up for approximately 3 months.  Chest and abdomen CT scan on 02/04/2016 revealed evolving radiation changes in the left upper and left lower lobes. Previously measured left upper lobe nodule was no longer visualized. There was no evidence of metastatic disease.  Right hilar lymph nodes were not considered enlarged by CT size criteria and are nonspecific, but appeared new from the prior exam.  Bone scan on 02/19/2016 revealed no definite evidence of metastatic disease. There was subtle uptake within the posterior aspects of the upper ribs bilaterally (nonspecific and not entirely new).  There was a tiny focus of increased uptake near the vertex of the calvarium (new).  She has been hoarse since 02/01/2016.  She has ongoing hoarseness and difficulty swallowing.  She has not had her ENT or swallowing evaluations.  She has had delays in therapy secondary to tooth abscesses.  She has had another delay in therapy secondary to "the flu".   She has received 6 cycles of maintenance Alimta (restarted 02/29/2016 - 06/06/2016; 08/01/2016; 10/31/2016).  She receives B12 every 9 weeks (last 10/24/2016) and is on daily folic acid.   CEA has been monitored: 3.3 on 02/29/2016, 4.1 on 03/28/2016, 4.2 on 04/17/2016, 5.0 on 08/22/2016 and 9.4 on 10/31/2016.  She was admitted to [facility] from 06/21/2016 - 06/22/2016 with severe shortness of breath secondary to possible aspiration.  Chest CT angiogram on 06/21/2016 revealed no evidence of pulmonary embolism . There was stable patchy peribronchovascular interstitial thickening, volume loss and patchy reticulation in the left upper lobe and central left  lower lobe, presumably representing stable posttreatment change.  Bilateral lower extremity duplex revealed no evidence of thrombosis.  She was discharged on prednisone, Spiriva, and Levaquin.  She was admitted to [facility] from 11/07/2016 - 11/11/2016 with sepsis and hospital acquired pneumonia.  CXR on 11/09/2016 revealed developing right and possible left sided airspace opacities suspicious for pneumonia.  She had poor oral intake x 3-4 days secondary to nausea and vomiting.  She was treated with Levaquin.  She has hyperthyroidism.  She is on methimazole.  TSH was 0.037 (low) and free T4 2.27 (high) on 08/01/2016.    Symptomatically, she feels good today.  She has chronic shoulder pain.  Weight is stable.  Magnesium is 1.8.  Plan: 1.  Labs today:  CBC with diff, CMP, Mg, CEA.  3.  Re-review importance of folic acid and Decadron while taking Alimta. 4.  Schedule restaging PET scan. 5.  Discuss need to reschedule appointments with ENT. 6.  Rx: oxycodone 10 mg po q 4 hours prn pain. 7.  Rx:  MS Contin 15 mg po q 12 hours 8.  Rx:  Decadron 4 mg BID day before and after chemotherapy. 9.  Urine drug screen. 10.  RTC after PET scan.   Lequita Asal, MD  11/24/2016, 5:04 AM

## 2016-12-08 ENCOUNTER — Other Ambulatory Visit: Payer: Self-pay | Admitting: *Deleted

## 2016-12-08 DIAGNOSIS — C3490 Malignant neoplasm of unspecified part of unspecified bronchus or lung: Secondary | ICD-10-CM

## 2016-12-08 MED ORDER — MORPHINE SULFATE ER 15 MG PO TBCR: 15.0000 mg | EXTENDED_RELEASE_TABLET | Freq: Two times a day (BID) | ORAL | 0 refills | Status: DC

## 2016-12-08 MED ORDER — OXYCODONE HCL 10 MG PO TABS: 10.0000 mg | ORAL_TABLET | ORAL | 0 refills | Status: DC | PRN

## 2016-12-08 NOTE — Telephone Encounter (Signed)
opened  In error

## 2016-12-08 NOTE — Telephone Encounter (Signed)
Requesting refill on pain meds, she has No Showed for her last 2 appointments. Please advise

## 2016-12-23 ENCOUNTER — Telehealth: Payer: Self-pay | Admitting: *Deleted

## 2016-12-23 ENCOUNTER — Inpatient Hospital Stay: Payer: Medicaid Other

## 2016-12-23 ENCOUNTER — Inpatient Hospital Stay: Payer: Medicaid Other | Attending: Hematology and Oncology | Admitting: Hematology and Oncology

## 2016-12-23 ENCOUNTER — Encounter: Payer: Self-pay | Admitting: Hematology and Oncology

## 2016-12-23 ENCOUNTER — Other Ambulatory Visit: Payer: Self-pay

## 2016-12-23 VITALS — BP 116/83 | HR 99 | Temp 96.2°F | Resp 18 | Wt 125.2 lb

## 2016-12-23 DIAGNOSIS — Z809 Family history of malignant neoplasm, unspecified: Secondary | ICD-10-CM

## 2016-12-23 DIAGNOSIS — R21 Rash and other nonspecific skin eruption: Secondary | ICD-10-CM | POA: Insufficient documentation

## 2016-12-23 DIAGNOSIS — Z87891 Personal history of nicotine dependence: Secondary | ICD-10-CM | POA: Diagnosis not present

## 2016-12-23 DIAGNOSIS — C770 Secondary and unspecified malignant neoplasm of lymph nodes of head, face and neck: Secondary | ICD-10-CM | POA: Diagnosis not present

## 2016-12-23 DIAGNOSIS — R490 Dysphonia: Secondary | ICD-10-CM | POA: Insufficient documentation

## 2016-12-23 DIAGNOSIS — I10 Essential (primary) hypertension: Secondary | ICD-10-CM | POA: Insufficient documentation

## 2016-12-23 DIAGNOSIS — E0590 Thyrotoxicosis, unspecified without thyrotoxic crisis or storm: Secondary | ICD-10-CM | POA: Diagnosis not present

## 2016-12-23 DIAGNOSIS — Z9221 Personal history of antineoplastic chemotherapy: Secondary | ICD-10-CM | POA: Diagnosis not present

## 2016-12-23 DIAGNOSIS — C3492 Malignant neoplasm of unspecified part of left bronchus or lung: Secondary | ICD-10-CM | POA: Insufficient documentation

## 2016-12-23 DIAGNOSIS — K047 Periapical abscess without sinus: Secondary | ICD-10-CM

## 2016-12-23 DIAGNOSIS — Z8701 Personal history of pneumonia (recurrent): Secondary | ICD-10-CM | POA: Diagnosis not present

## 2016-12-23 DIAGNOSIS — Z79899 Other long term (current) drug therapy: Secondary | ICD-10-CM | POA: Diagnosis not present

## 2016-12-23 DIAGNOSIS — J3801 Paralysis of vocal cords and larynx, unilateral: Secondary | ICD-10-CM | POA: Diagnosis not present

## 2016-12-23 DIAGNOSIS — Z923 Personal history of irradiation: Secondary | ICD-10-CM | POA: Insufficient documentation

## 2016-12-23 DIAGNOSIS — C3490 Malignant neoplasm of unspecified part of unspecified bronchus or lung: Secondary | ICD-10-CM

## 2016-12-23 DIAGNOSIS — M898X9 Other specified disorders of bone, unspecified site: Secondary | ICD-10-CM | POA: Diagnosis not present

## 2016-12-23 DIAGNOSIS — J449 Chronic obstructive pulmonary disease, unspecified: Secondary | ICD-10-CM | POA: Diagnosis not present

## 2016-12-23 DIAGNOSIS — R51 Headache: Secondary | ICD-10-CM | POA: Diagnosis not present

## 2016-12-23 DIAGNOSIS — R634 Abnormal weight loss: Secondary | ICD-10-CM | POA: Diagnosis not present

## 2016-12-23 DIAGNOSIS — M25519 Pain in unspecified shoulder: Secondary | ICD-10-CM | POA: Diagnosis not present

## 2016-12-23 LAB — CBC WITH DIFFERENTIAL/PLATELET
Basophils Absolute: 0.1 10*3/uL (ref 0–0.1)
Basophils Relative: 1 %
Eosinophils Absolute: 0.1 10*3/uL (ref 0–0.7)
Eosinophils Relative: 1 %
HCT: 42.3 % (ref 35.0–47.0)
Hemoglobin: 14.4 g/dL (ref 12.0–16.0)
Lymphocytes Relative: 25 %
Lymphs Abs: 1.5 10*3/uL (ref 1.0–3.6)
MCH: 33.7 pg (ref 26.0–34.0)
MCHC: 34 g/dL (ref 32.0–36.0)
MCV: 99 fL (ref 80.0–100.0)
Monocytes Absolute: 0.5 10*3/uL (ref 0.2–0.9)
Monocytes Relative: 9 %
Neutro Abs: 3.7 10*3/uL (ref 1.4–6.5)
Neutrophils Relative %: 64 %
Platelets: 230 10*3/uL (ref 150–440)
RBC: 4.27 MIL/uL (ref 3.80–5.20)
RDW: 15.1 % — ABNORMAL HIGH (ref 11.5–14.5)
WBC: 5.9 10*3/uL (ref 3.6–11.0)

## 2016-12-23 LAB — COMPREHENSIVE METABOLIC PANEL
ALT: 18 U/L (ref 14–54)
AST: 46 U/L — ABNORMAL HIGH (ref 15–41)
Albumin: 4.3 g/dL (ref 3.5–5.0)
Alkaline Phosphatase: 70 U/L (ref 38–126)
Anion gap: 11 (ref 5–15)
BUN: 7 mg/dL (ref 6–20)
CO2: 24 mmol/L (ref 22–32)
Calcium: 9.9 mg/dL (ref 8.9–10.3)
Chloride: 98 mmol/L — ABNORMAL LOW (ref 101–111)
Creatinine, Ser: 0.92 mg/dL (ref 0.44–1.00)
GFR calc Af Amer: >60 mL/min (ref >60)
GFR calc non Af Amer: >60 mL/min (ref >60)
Glucose, Bld: 122 mg/dL — ABNORMAL HIGH (ref 65–99)
Potassium: 3.5 mmol/L (ref 3.5–5.1)
Sodium: 133 mmol/L — ABNORMAL LOW (ref 135–145)
Total Bilirubin: 1 mg/dL (ref 0.3–1.2)
Total Protein: 8.3 g/dL — ABNORMAL HIGH (ref 6.5–8.1)

## 2016-12-23 MED ORDER — OXYCODONE HCL 10 MG PO TABS: 10.0000 mg | ORAL_TABLET | ORAL | 0 refills | Status: DC | PRN

## 2016-12-23 MED ORDER — CYANOCOBALAMIN 1000 MCG/ML IJ SOLN
1000.0000 ug | Freq: Once | INTRAMUSCULAR | Status: AC
  Administered 2016-12-23: 1000 ug via INTRAMUSCULAR
  Filled 2016-12-23: qty 1

## 2016-12-23 MED ORDER — MORPHINE SULFATE ER 15 MG PO TBCR: 15.0000 mg | EXTENDED_RELEASE_TABLET | Freq: Two times a day (BID) | ORAL | 0 refills | Status: DC

## 2016-12-23 NOTE — Telephone Encounter (Signed)
Called Audrey Mcgee about appointment.  Patient will require new referral, will fax information.

## 2016-12-23 NOTE — Progress Notes (Signed)
[facility] day:  12/23/2016   Chief Complaint: Audrey Mcgee is a 56 y.o. female with metastatic poorly differentiated non-small cell lung cancer who is seen for reassessment after 6 cycles of Alimta and interval hospitalization.  HPI: The patient was last seen in the medical oncology clinic by me on 10/31/2016.  At that time, she felt good except for a little headache and shoulder pain.  She had been sick with the flu the month prior to her visit.  She needed to reschedule her appointment with ENT and CT scan to further evaluate her left vocal cord paralysis.  She received her Alimta uneventfully.  Restaging PET scan was ordered.  We discussed her increased creatinine at last visit.  She was encouraged to drink fluids.  She was admitted to [facility] from 11/07/2016 - 11/11/2016 with weakness and tachypnea.  She described poor oral intake x 3-4 days secondary to nausea and vomiting.  CXR on 11/09/2016 revealed developing right and possible left sided airspace opacities suspicious for pneumonia.  She was treated with Levaquin.  CBC on 11/07/2016 revealed a hematocrit of 44.7, hemoglobin 15.7, MCV 96, platelets 145,000, white count 1800.  Creatinine was 1.04. Lactic acid was 1.6.  CBC on 11/10/2016 included a hematocrit of 35.2, hemoglobin 12.3, MCV 98.5, platelets 154,000, white count 7400.   Creatinine was 0.79.  Symptomatically, she has felt better for the past 2 weeks. Her voice has improved.  She need to reschedule her appointment with ENT.  She lost lost weight while hospitalized.  She has a rash between her legs.  She continues to have bone pain.   Past Medical History:  Diagnosis Date  . COPD (chronic obstructive pulmonary disease) ([place])   . Hypertension   . Lung cancer ([place])   . Thyroid disease     Past Surgical History:  Procedure Laterality Date  . CESAREAN SECTION CLASSICAL    . STOMACH SURGERY     Pt reports for a tumor     Family History  Problem Relation Age of Onset  . Hypertension Father   . Cancer Father   . Diabetes Father   . Cancer Maternal Aunt     Social History:  reports that she has quit smoking. Her smoking use included Cigarettes. She quit after 15.00 years of use. She has never used smokeless tobacco. She reports that she does not drink alcohol or use drugs.  She stopped smoking.  Her nephew shot in [place] (caused delays in her treatment).  Her boyfriend's father  died of metastatic lung cancer.  Her boyfriend works at a school with children.  The patient's 1st cousin died (age 8) of a cerebral aneurysm.  The patient is accompanied by her friend, Linton Rump, today.    Allergies:  Allergies  Allergen Reactions  . No Known Allergies     Current Medications: Current Outpatient Prescriptions  Medication Sig Dispense Refill  . benzonatate (TESSALON) 100 MG capsule Take 1 capsule (100 mg total) by mouth 3 (three) times daily as needed for cough. 20 capsule 0  . dexamethasone (DECADRON) 4 MG tablet Take 41m BID the day before and day after chemotherapy. (Patient taking differently: Take 4 mg by mouth 2 (two) times daily. Take 430mBID the day before and day after chemotherapy.) 20 tablet 0  . docusate sodium (COLACE) 100 MG capsule Take 100 mg by mouth 2 (two) times daily as needed for mild constipation.    . folic acid (FOLVITE)  800 MCG tablet Take 800 mcg by mouth daily.     [name] lidocaine (XYLOCAINE) 2 % solution Use as directed 10 mLs in the mouth or throat every 4 (four) hours as needed for mouth pain.    [name] losartan (COZAAR) 100 MG tablet Take 1 tablet (100 mg total) by mouth daily. 30 tablet 0  . magic mouthwash w/lidocaine SOLN Take 5 mLs by mouth 4 (four) times daily as needed for mouth pain. 480 mL 1  . magnesium oxide (MAG-OX) 400 MG tablet Take 1 tablet (400 mg total) by mouth daily. 30 tablet 0  . megestrol (MEGACE) 40 MG/ML suspension Take 10 mLs (400 mg total) by mouth 2 (two) times  daily. 300 mL 1  . methimazole (TAPAZOLE) 5 MG tablet take 1 tablet by mouth twice a day for OVERACTIVE THYROID  0  . metoprolol (LOPRESSOR) 50 MG tablet take 1 tablet by mouth twice a day NOTE DOSE INCREASE  0  . morphine (MS CONTIN) 15 MG 12 hr tablet Take 1 tablet (15 mg total) by mouth 2 (two) times daily. 30 tablet 0  . omeprazole (PRILOSEC) 20 MG capsule Take 1 capsule (20 mg total) by mouth daily. 30 capsule 3  . ondansetron (ZOFRAN) 8 MG tablet Take 1 tablet (8 mg total) by mouth 2 (two) times daily as needed for nausea or vomiting. 30 tablet 1  . Oxycodone HCl 10 MG TABS Take 1 tablet (10 mg total) by mouth every 4 (four) hours as needed (pain). 30 tablet 0  . tiotropium (SPIRIVA) 18 MCG inhalation capsule Place 1 capsule (18 mcg total) into inhaler and inhale daily. 30 capsule 1  . levofloxacin (LEVAQUIN) 750 MG tablet Take 1 tablet (750 mg total) by mouth daily. (Patient not taking: Reported on 12/23/2016) 4 tablet 0  . predniSONE (DELTASONE) 20 MG tablet Take 2 tablets (40 mg total) by mouth daily with breakfast. (Patient not taking: Reported on 12/23/2016) 8 tablet 0   No current facility-administered medications for this visit.     Review of Systems:  GENERAL: Feels better. No fevers or chills. Weight loss with hospitalization, now improved. PERFORMANCE STATUS (ECOG): 1 HEENT: Chronic hoarseness (see HPI).  No sore throat, mouth sores or tenderness. Lungs:  Shortness of breath with exertion (stable).  Chronic cough.  No hemoptysis. Cardiac: No chest pain, palpitations, orthopnea, or PND. GI: No nausea, vomiting, diarrhea, constipation, melena or hematochezia. GU: No urgency, frequency, dysuria, or hematuria. Musculoskeletal: Right shoulder pain.No muscle tenderness. Extremities: No pain or swelling. Skin: No rashes or skin changes. Neuro: Headache.  No numbness or weakness, balance or coordination issues. Endocrine: No diabetes.  Thyroid issues.  No hot flashes or  night sweats. Psych: No mood changes, depression or anxiety. Pain: Pain in shoulder (stable). Review of systems: All other systems reviewed and found to be negative.  Physical Exam: Blood pressure 116/83, pulse 99, temperature (!) 96.2 F (35.7 C), temperature source Tympanic, resp. rate 18, weight 125 lb 3.5 oz (56.8 kg).  GENERAL: Thin woman sitting comfortably in the exam room in no acute distress. MENTAL STATUS: Alert and oriented to person, place and time. HEAD: Short dark styled wig Normocephalic, atraumatic, no Cushingoid features.  EYES: Brown eyes. Ruddy sclera.  Pupils equal round and reactive to light and accomodation. No conjunctivitis or scleral icterus. ENT: Hoarseness, improved.  Poor dentition.  Oropharynx clear without lesion. Tongue normal. Mucous membranes moist.  RESPIRATORY: Clear to auscultation without rales, wheezes or rhonchi. CARDIOVASCULAR: Regular rate and rhythm  without murmur, rub or gallop. ABDOMEN: Soft, non-tender, with active bowel sounds, and no hepatosplenomegaly. No masses. SKIN: Dry skin.  No ulcers or lesions. EXTREMITIES: No edema, skin discoloration or tenderness. No palpable cords. LYMPH NODES: No palpable cervical, supraclavicular, axillary or inguinal adenopathy  NEUROLOGICAL: Unremarkable. PSYCH: Appropriate.   Orders Only on 12/23/2016  Component Date Value Ref Range Status  . WBC 12/23/2016 5.9  3.6 - 11.0 K/uL Final  . RBC 12/23/2016 4.27  3.80 - 5.20 MIL/uL Final  . Hemoglobin 12/23/2016 14.4  12.0 - 16.0 g/dL Final  . HCT 12/23/2016 42.3  35.0 - 47.0 % Final  . MCV 12/23/2016 99.0  80.0 - 100.0 fL Final  . MCH 12/23/2016 33.7  26.0 - 34.0 pg Final  . MCHC 12/23/2016 34.0  32.0 - 36.0 g/dL Final  . RDW 12/23/2016 15.1* 11.5 - 14.5 % Final  . Platelets 12/23/2016 230  150 - 440 K/uL Final  . Neutrophils Relative % 12/23/2016 64  % Final  . Lymphocytes Relative 12/23/2016 25  % Final  . Monocytes Relative  12/23/2016 9  % Final  . Eosinophils Relative 12/23/2016 1  % Final  . Basophils Relative 12/23/2016 1  % Final  . Neutro Abs 12/23/2016 3.7  1.4 - 6.5 K/uL Final  . Lymphs Abs 12/23/2016 1.5  1.0 - 3.6 K/uL Final  . Monocytes Absolute 12/23/2016 0.5  0.2 - 0.9 K/uL Final  . Eosinophils Absolute 12/23/2016 0.1  0 - 0.7 K/uL Final  . Basophils Absolute 12/23/2016 0.1  0 - 0.1 K/uL Final  . Smear Review 12/23/2016 SMEAR SCANNED   Final  . Sodium 12/23/2016 133* 135 - 145 mmol/L Final  . Potassium 12/23/2016 3.5  3.5 - 5.1 mmol/L Final  . Chloride 12/23/2016 98* 101 - 111 mmol/L Final  . CO2 12/23/2016 24  22 - 32 mmol/L Final  . Glucose, Bld 12/23/2016 122* 65 - 99 mg/dL Final  . BUN 12/23/2016 7  6 - 20 mg/dL Final  . Creatinine, Ser 12/23/2016 0.92  0.44 - 1.00 mg/dL Final  . Calcium 12/23/2016 9.9  8.9 - 10.3 mg/dL Final  . Total Protein 12/23/2016 8.3* 6.5 - 8.1 g/dL Final  . Albumin 12/23/2016 4.3  3.5 - 5.0 g/dL Final  . AST 12/23/2016 46* 15 - 41 U/L Final  . ALT 12/23/2016 18  14 - 54 U/L Final  . Alkaline Phosphatase 12/23/2016 70  38 - 126 U/L Final  . Total Bilirubin 12/23/2016 1.0  0.3 - 1.2 mg/dL Final  . GFR calc non Af Amer 12/23/2016 >60  >60 mL/min Final  . GFR calc Af Amer 12/23/2016 >60  >60 mL/min Final   Comment: (NOTE) The eGFR has been calculated using the CKD EPI equation. This calculation has not been validated in all clinical situations. eGFR's persistently <60 mL/min signify possible Chronic Kidney Disease.   . Anion gap 12/23/2016 11  5 - 15 Final    Assessment:  Audrey Mcgee is a 56 y.o. female with metastatic poorly differentiated adenocarcinoma of lung. She presented with a 5 month history of enlarging bilateral neck masses, progressive hoarseness, dysphagia, dyspnea on exertion, cough, left sided hearing loss, neck pain, and a 30 pound weight loss.  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 Biopsy  of the right supraclavicular lymph node on 10/03/2014 revealed poorly differentiated non-small cell carcinoma, favoring adenocarcinoma consistent with lung primary (TTF-1 positive). KRAS was positive.   PET scan on 10/24/2014 revealed a  left hilar mass with associated left upper lobe obstruction/atelectasis with extensive bilateral mediastinal and cervical adenopathy. Chest CT from 10/10/2014 also noted a 2.2 x 0.9 cm soft tissue density with internal calcifications in the right breast. Head MRI revealed no evidence of metastatic disease.  She received palliative radiation (3000 cGy) to the left lung from 10/24/2014 - 11/07/2014. She received 6 cycles carboplatin and Alimta (11/14/2014 - 04/30/2015). There was a delay between cycle #2 and cycle #3 secondary to a switch in caregivers.  She was diagnosed with a left peri-hilar infiltrate on 02/23/2015. She has completed a course of Levaquin. Chest CT on 03/21/2015 revealed LUL scarring, patchy ill defined airspace opacities in the lingula, upper esophageal thickening, and small mediastinal and hilar nodes. She completed a course of Azithromycin.  She received 2 cycles of maintenance Alimta (05/22/2015 - 06/15/2015). Chest CT on 07/09/2015 revealed patchy ground glass and nodular consolidation in the left upper lobe and to a lesser extent left lower lobe, minimally progressive from 03/21/2015.  Bone scan on 07/09/2015 revealed no evidence of metastatic disease.  CXR on 09/24/2015 revealed patchy left perihilar airspace opacities.  She was treated with azithromycin.  Chest CT on 10/17/2015 revealed patchy ground-glass and ill-defined nodular areas of opacity in the left apex, lingula, and anterior left lower lobe generally appeared decreased in the interval although 1 of the nodular areas in the left apex has progressed slightly since the previous study.  Overall imaging features are felt to be most likely treatment related.  There had been an  interval slight decrease in proximal to mid esophageal circumferential wall thickening, potentially treatment related.   She was lost to follow-up for approximately 3 months.  Chest and abdomen CT scan on 02/04/2016 revealed evolving radiation changes in the left upper and left lower lobes. Previously measured left upper lobe nodule was no longer visualized. There was no evidence of metastatic disease.  Right hilar lymph nodes were not considered enlarged by CT size criteria and are nonspecific, but appeared new from the prior exam.  Bone scan on 02/19/2016 revealed no definite evidence of metastatic disease. There was subtle uptake within the posterior aspects of the upper ribs bilaterally (nonspecific and not entirely new).  There was a tiny focus of increased uptake near the vertex of the calvarium (new).  She has been hoarse since 02/01/2016.  She has ongoing hoarseness and difficulty swallowing.  She has not had her ENT or swallowing evaluations.  She has had delays in therapy secondary to tooth abscesses.  She has had another delay in therapy secondary to "the flu".   She has received 6 cycles of maintenance Alimta (restarted 02/29/2016 - 06/06/2016; 08/01/2016; 10/31/2016).  She receives B12 every 9 weeks (last 10/24/2016) and is on daily folic acid.   CEA has been monitored: 3.3 on 02/29/2016, 4.1 on 03/28/2016, 4.2 on 04/17/2016, 5.0 on 08/22/2016 and 9.4 on 10/31/2016.  She was admitted to [facility] from 06/21/2016 - 06/22/2016 with severe shortness of breath secondary to possible aspiration.  Chest CT angiogram on 06/21/2016 revealed no evidence of pulmonary embolism . There was stable patchy peribronchovascular interstitial thickening, volume loss and patchy reticulation in the left upper lobe and central left lower lobe, presumably representing stable posttreatment change.  Bilateral lower extremity duplex revealed no evidence of thrombosis.  She was discharged on prednisone, Spiriva, and  Levaquin.   She was admitted to [facility] from 11/07/2016 - 11/11/2016 with sepsis and hospital acquired pneumonia.  CXR on 11/09/2016 revealed developing right  and possible left sided airspace opacities suspicious for pneumonia.  She had poor oral intake x 3-4 days secondary to nausea and vomiting.  She was treated with Levaquin.  She has hyperthyroidism.  She is on methimazole.  TSH was 0.037 (low) and free T4 2.27 (high) on 08/01/2016.    Symptomatically, she remains hoarse.  She has unexplained shoulder pain.  She lost weight when hospitalized.   Plan: 1.  Labs today:  CBC with diff, CMP, Mg, CEA. 2.  B12 today 3.  Reschedule restaging PET scan re: increasing CEA, bone pain, unexplained hoarseness, and prior negative CT/bone scan. 4.  Reschedule appointments with [facility] [phone]). 5.  Rx: oxycodone 10 mg po q 4 hours prn pain. 6.  Rx:  MS Contin 15 mg po q 12 hours 7.  RTC after PET scan.   Lequita Asal, MD  12/23/2016, 12:12 PM

## 2016-12-23 NOTE — Progress Notes (Signed)
Patient has a headache today.  States she has exertional SOB.  Appetite not so good.  States she was in the hospital with pneumonia and then got the flu after coming home. States she does not feel so well today.  Patient requesting refill for Oxycodone and MS Contin.

## 2017-01-01 ENCOUNTER — Ambulatory Visit: Payer: Medicaid Other | Admitting: Hematology and Oncology

## 2017-01-01 ENCOUNTER — Ambulatory Visit: Admission: RE | Admit: 2017-01-01 | Payer: Medicaid Other | Source: Ambulatory Visit

## 2017-01-02 ENCOUNTER — Ambulatory Visit: Payer: Medicaid Other | Admitting: Hematology and Oncology

## 2017-01-06 ENCOUNTER — Other Ambulatory Visit: Payer: Self-pay | Admitting: *Deleted

## 2017-01-06 NOTE — Telephone Encounter (Signed)
Per pharmacy, she has an active prescription available

## 2017-01-08 ENCOUNTER — Ambulatory Visit: Payer: Medicaid Other

## 2017-01-16 ENCOUNTER — Other Ambulatory Visit: Payer: Self-pay | Admitting: *Deleted

## 2017-01-16 ENCOUNTER — Telehealth: Payer: Self-pay | Admitting: *Deleted

## 2017-01-16 DIAGNOSIS — C3490 Malignant neoplasm of unspecified part of unspecified bronchus or lung: Secondary | ICD-10-CM

## 2017-01-16 MED ORDER — OXYCODONE HCL 10 MG PO TABS: 10.0000 mg | ORAL_TABLET | ORAL | 0 refills | Status: DC | PRN

## 2017-01-16 MED ORDER — MORPHINE SULFATE ER 15 MG PO TBCR: 15.0000 mg | EXTENDED_RELEASE_TABLET | Freq: Two times a day (BID) | ORAL | 0 refills | Status: DC

## 2017-01-16 NOTE — Telephone Encounter (Signed)
Called pt to let her know that her prescriptions were ready for pick up and that we would look into the insurance and get with Dr. Mike Gip about a f/u appt.

## 2017-01-22 ENCOUNTER — Other Ambulatory Visit: Payer: Self-pay | Admitting: *Deleted

## 2017-01-22 ENCOUNTER — Telehealth: Payer: Self-pay | Admitting: *Deleted

## 2017-01-22 DIAGNOSIS — C3492 Malignant neoplasm of unspecified part of left bronchus or lung: Secondary | ICD-10-CM

## 2017-01-22 NOTE — Telephone Encounter (Signed)
Patient notified of PET and follow up appts

## 2017-01-22 NOTE — Telephone Encounter (Signed)
Called requesting to go ahead and get an appt for chemo and see md and not waiting for a scan. Please advise

## 2017-01-22 NOTE — Telephone Encounter (Signed)
Called patient and informed her of her appt for PET scan and that it had been approved and f/u with Dr. Mike Gip and chemo, voiced understanding.

## 2017-01-27 NOTE — Addendum Note (Signed)
Encounter addended by: Ane Payment, RT on: 01/27/2017  9:28 AM<BR>    Actions taken: Imaging Exam begun

## 2017-01-28 ENCOUNTER — Telehealth: Payer: Self-pay | Admitting: *Deleted

## 2017-01-28 NOTE — Telephone Encounter (Signed)
Called patient r/t concern about PET scan tomorrow and inability to relax to do the scan.  Dr. Mike Gip ordered ativan 0.5 mg po x 1 prior to the scan and this med was called to [facility], Patient informed to take the med with her to the scan and for her not to take it with her pain medication and for her to take a driver with her to the PET scan because of the lethargic effect of the ativan. Voiced understanding.

## 2017-01-29 ENCOUNTER — Ambulatory Visit
Admission: RE | Admit: 2017-01-29 | Discharge: 2017-01-29 | Disposition: A | Discharge status: Home / Self Care | Payer: Medicaid Other | Source: Ambulatory Visit | Attending: Hematology and Oncology | Admitting: Hematology and Oncology

## 2017-01-29 DIAGNOSIS — C770 Secondary and unspecified malignant neoplasm of lymph nodes of head, face and neck: Secondary | ICD-10-CM | POA: Diagnosis not present

## 2017-01-29 DIAGNOSIS — R591 Generalized enlarged lymph nodes: Secondary | ICD-10-CM | POA: Diagnosis not present

## 2017-01-29 DIAGNOSIS — C7970 Secondary malignant neoplasm of unspecified adrenal gland: Secondary | ICD-10-CM | POA: Insufficient documentation

## 2017-01-29 DIAGNOSIS — R918 Other nonspecific abnormal finding of lung field: Secondary | ICD-10-CM | POA: Diagnosis not present

## 2017-01-29 DIAGNOSIS — C3492 Malignant neoplasm of unspecified part of left bronchus or lung: Secondary | ICD-10-CM | POA: Diagnosis not present

## 2017-01-29 LAB — GLUCOSE, CAPILLARY: Glucose-Capillary: 55 mg/dL — ABNORMAL LOW (ref 65–99)

## 2017-01-29 MED ORDER — FLUDEOXYGLUCOSE F - 18 (FDG) INJECTION
12.6600 | Freq: Once | INTRAVENOUS | Status: AC | PRN
  Administered 2017-01-29: 12.66 via INTRAVENOUS

## 2017-01-30 ENCOUNTER — Other Ambulatory Visit: Payer: Medicaid Other

## 2017-01-30 ENCOUNTER — Ambulatory Visit: Payer: Medicaid Other | Admitting: Hematology and Oncology

## 2017-01-30 ENCOUNTER — Ambulatory Visit: Payer: Medicaid Other

## 2017-02-01 ENCOUNTER — Other Ambulatory Visit: Payer: Self-pay | Admitting: Hematology and Oncology

## 2017-02-01 DIAGNOSIS — C770 Secondary and unspecified malignant neoplasm of lymph nodes of head, face and neck: Secondary | ICD-10-CM

## 2017-02-01 DIAGNOSIS — C3492 Malignant neoplasm of unspecified part of left bronchus or lung: Secondary | ICD-10-CM

## 2017-02-01 DIAGNOSIS — Z5111 Encounter for antineoplastic chemotherapy: Secondary | ICD-10-CM | POA: Insufficient documentation

## 2017-02-01 DIAGNOSIS — C3490 Malignant neoplasm of unspecified part of unspecified bronchus or lung: Secondary | ICD-10-CM

## 2017-02-01 NOTE — Progress Notes (Deleted)
[facility] day:  02/01/2017   Chief Complaint: Audrey Mcgee is a 56 y.o. female with metastatic poorly differentiated non-small cell lung cancer who is seen for review of interval PET scan and assessment prior to cycle #7 Alimta.  HPI: The patient was last seen in the medical oncology clinic by me on 12/23/2016.  At that time, she remained hoarse.  She hadunexplained shoulder pain.  She had lost weight when hospitalized.   Appointment with ENT was rescheduled.  Restaging PET scan was ordered.  PET scan was delayed secondary to insurance.  PET scan on 01/29/2017 revealed abnormal hypermetabolic adenopathy in the neck, chest, and upper abdomen with probable involvement of the left adrenal gland, suspicious for recurrent metastatic lung cancer.  A new primary malignancy such as lymphoma was not entirely excluded but seems less likely.  There was stable sub solid nodularity in the left upper lobe with a new partially solid 7 mm nodule in the left lower lobe, not currently hypermetabolic.  Symptomatically,    Past Medical History:  Diagnosis Date  . COPD (chronic obstructive pulmonary disease) ([place])   . Hypertension   . Lung cancer ([place])   . Thyroid disease     Past Surgical History:  Procedure Laterality Date  . CESAREAN SECTION CLASSICAL    . STOMACH SURGERY     Pt reports for a tumor    Family History  Problem Relation Age of Onset  . Hypertension Father   . Cancer Father   . Diabetes Father   . Cancer Maternal Aunt     Social History:  reports that she has quit smoking. Her smoking use included Cigarettes. She quit after 15.00 years of use. She has never used smokeless tobacco. She reports that she does not drink alcohol or use drugs.  She stopped smoking.  Her nephew shot in [place] (caused delays in her treatment).  Her boyfriend's father  died of metastatic lung cancer.  Her boyfriend works at a school with children.  The  patient's 1st cousin died (age 44) of a cerebral aneurysm.  The patient is accompanied by her friend, Linton Rump, today.    Allergies:  Allergies  Allergen Reactions  . No Known Allergies     Current Medications: Current Outpatient Prescriptions  Medication Sig Dispense Refill  . benzonatate (TESSALON) 100 MG capsule Take 1 capsule (100 mg total) by mouth 3 (three) times daily as needed for cough. 20 capsule 0  . dexamethasone (DECADRON) 4 MG tablet Take 25m BID the day before and day after chemotherapy. (Patient taking differently: Take 4 mg by mouth 2 (two) times daily. Take 463mBID the day before and day after chemotherapy.) 20 tablet 0  . docusate sodium (COLACE) 100 MG capsule Take 100 mg by mouth 2 (two) times daily as needed for mild constipation.    . folic acid (FOLVITE) 80258CG tablet Take 800 mcg by mouth daily.     . Marland Kitchenevofloxacin (LEVAQUIN) 750 MG tablet Take 1 tablet (750 mg total) by mouth daily. (Patient not taking: Reported on 12/23/2016) 4 tablet 0  . lidocaine (XYLOCAINE) 2 % solution Use as directed 10 mLs in the mouth or throat every 4 (four) hours as needed for mouth pain.    . Marland Kitchenosartan (COZAAR) 100 MG tablet Take 1 tablet (100 mg total) by mouth daily. 30 tablet 0  . magic mouthwash w/lidocaine SOLN Take 5 mLs by mouth 4 (four) times daily as needed for  mouth pain. 480 mL 1  . magnesium oxide (MAG-OX) 400 MG tablet Take 1 tablet (400 mg total) by mouth daily. 30 tablet 0  . megestrol (MEGACE) 40 MG/ML suspension Take 10 mLs (400 mg total) by mouth 2 (two) times daily. 300 mL 1  . methimazole (TAPAZOLE) 5 MG tablet take 1 tablet by mouth twice a day for OVERACTIVE THYROID  0  . metoprolol (LOPRESSOR) 50 MG tablet take 1 tablet by mouth twice a day NOTE DOSE INCREASE  0  . morphine (MS CONTIN) 15 MG 12 hr tablet Take 1 tablet (15 mg total) by mouth 2 (two) times daily. 30 tablet 0  . omeprazole (PRILOSEC) 20 MG capsule Take 1 capsule (20 mg total) by mouth daily. 30 capsule 3   . ondansetron (ZOFRAN) 8 MG tablet Take 1 tablet (8 mg total) by mouth 2 (two) times daily as needed for nausea or vomiting. 30 tablet 1  . Oxycodone HCl 10 MG TABS Take 1 tablet (10 mg total) by mouth every 4 (four) hours as needed (pain). 30 tablet 0  . predniSONE (DELTASONE) 20 MG tablet Take 2 tablets (40 mg total) by mouth daily with breakfast. (Patient not taking: Reported on 12/23/2016) 8 tablet 0  . tiotropium (SPIRIVA) 18 MCG inhalation capsule Place 1 capsule (18 mcg total) into inhaler and inhale daily. 30 capsule 1   No current facility-administered medications for this visit.     Review of Systems:  GENERAL: Feels better. No fevers or chills. Weight loss with hospitalization, now improved. PERFORMANCE STATUS (ECOG): 1 HEENT: Chronic hoarseness (see HPI).  No sore throat, mouth sores or tenderness. Lungs:  Shortness of breath with exertion (stable).  Chronic cough.  No hemoptysis. Cardiac: No chest pain, palpitations, orthopnea, or PND. GI: No nausea, vomiting, diarrhea, constipation, melena or hematochezia. GU: No urgency, frequency, dysuria, or hematuria. Musculoskeletal: Right shoulder pain.No muscle tenderness. Extremities: No pain or swelling. Skin: No rashes or skin changes. Neuro: Headache.  No numbness or weakness, balance or coordination issues. Endocrine: No diabetes.  Thyroid issues.  No hot flashes or night sweats. Psych: No mood changes, depression or anxiety. Pain: Pain in shoulder (stable). Review of systems: All other systems reviewed and found to be negative.  Physical Exam: Last menstrual period 01/30/2008.  GENERAL: Thin woman sitting comfortably in the exam room in no acute distress. MENTAL STATUS: Alert and oriented to person, place and time. HEAD: Short dark styled wig Normocephalic, atraumatic, no Cushingoid features.  EYES: Brown eyes. Ruddy sclera.  Pupils equal round and reactive to light and accomodation. No conjunctivitis  or scleral icterus. ENT: Hoarseness, improved.  Poor dentition.  Oropharynx clear without lesion. Tongue normal. Mucous membranes moist.  RESPIRATORY: Clear to auscultation without rales, wheezes or rhonchi. CARDIOVASCULAR: Regular rate and rhythm without murmur, rub or gallop. ABDOMEN: Soft, non-tender, with active bowel sounds, and no hepatosplenomegaly. No masses. SKIN: Dry skin.  No ulcers or lesions. EXTREMITIES: No edema, skin discoloration or tenderness. No palpable cords. LYMPH NODES: No palpable cervical, supraclavicular, axillary or inguinal adenopathy  NEUROLOGICAL: Unremarkable. PSYCH: Appropriate.   No visits with results within 3 Day(s) from this visit.  Latest known visit with results is:  Hospital Outpatient Visit on 01/29/2017  Component Date Value Ref Range Status  . Glucose-Capillary 01/29/2017 55* 65 - 99 mg/dL Final    Assessment:  Audrey Mcgee is a 56 y.o. female with metastatic poorly differentiated adenocarcinoma of lung. She presented with a 5 month history of enlarging bilateral  neck masses, progressive hoarseness, dysphagia, dyspnea on exertion, cough, left sided hearing loss, neck pain, and a 30 pound weight loss.  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 Biopsy of the right supraclavicular lymph node on 10/03/2014 revealed poorly differentiated non-small cell carcinoma, favoring adenocarcinoma consistent with lung primary (TTF-1 positive). KRAS was positive.   PET scan on 10/24/2014 revealed a left hilar mass with associated left upper lobe obstruction/atelectasis with extensive bilateral mediastinal and cervical adenopathy. Chest CT from 10/10/2014 also noted a 2.2 x 0.9 cm soft tissue density with internal calcifications in the right breast. Head MRI revealed no evidence of metastatic disease.  She received palliative radiation (3000 cGy) to the left lung from 10/24/2014 - 11/07/2014. She  received 6 cycles carboplatin and Alimta (11/14/2014 - 04/30/2015). There was a delay between cycle #2 and cycle #3 secondary to a switch in caregivers.  She was diagnosed with a left peri-hilar infiltrate on 02/23/2015. She has completed a course of Levaquin. Chest CT on 03/21/2015 revealed LUL scarring, patchy ill defined airspace opacities in the lingula, upper esophageal thickening, and small mediastinal and hilar nodes. She completed a course of Azithromycin.  She received 2 cycles of maintenance Alimta (05/22/2015 - 06/15/2015). Chest CT on 07/09/2015 revealed patchy ground glass and nodular consolidation in the left upper lobe and to a lesser extent left lower lobe, minimally progressive from 03/21/2015.  Bone scan on 07/09/2015 revealed no evidence of metastatic disease.  CXR on 09/24/2015 revealed patchy left perihilar airspace opacities.  She was treated with azithromycin.  Chest CT on 10/17/2015 revealed patchy ground-glass and ill-defined nodular areas of opacity in the left apex, lingula, and anterior left lower lobe generally appeared decreased in the interval although 1 of the nodular areas in the left apex has progressed slightly since the previous study.  Overall imaging features are felt to be most likely treatment related.  There had been an interval slight decrease in proximal to mid esophageal circumferential wall thickening, potentially treatment related.   She was lost to follow-up for approximately 3 months.  Chest and abdomen CT scan on 02/04/2016 revealed evolving radiation changes in the left upper and left lower lobes. Previously measured left upper lobe nodule was no longer visualized. There was no evidence of metastatic disease.  Right hilar lymph nodes were not considered enlarged by CT size criteria and are nonspecific, but appeared new from the prior exam.  Bone scan on 02/19/2016 revealed no definite evidence of metastatic disease. There was subtle uptake within the  posterior aspects of the upper ribs bilaterally (nonspecific and not entirely new).  There was a tiny focus of increased uptake near the vertex of the calvarium (new).  She has been hoarse since 02/01/2016.  She has ongoing hoarseness and difficulty swallowing.  She has not had her ENT or swallowing evaluations.  She has had delays in therapy secondary to tooth abscesses.  She has had another delay in therapy secondary to "the flu".   She has received 6 cycles of maintenance Alimta (restarted 02/29/2016 - 06/06/2016; 08/01/2016; 10/31/2016).  She receives B12 every 9 weeks (last 12/23/2016) and is on daily folic acid.   CEA has been monitored: 3.3 on 02/29/2016, 4.1 on 03/28/2016, 4.2 on 04/17/2016, 5.0 on 08/22/2016 and 9.4 on 10/31/2016.  PET scan on 01/29/2017 revealed abnormal hypermetabolic adenopathy in the neck, chest, and upper abdomen with probable involvement of the left adrenal gland, suspicious for recurrent metastatic lung cancer.  There was stable sub  solid nodularity in the left upper lobe with a new partially solid 7 mm nodule in the left lower lobe.  She was admitted to [facility] from 06/21/2016 - 06/22/2016 with severe shortness of breath secondary to possible aspiration.  Chest CT angiogram on 06/21/2016 revealed no evidence of pulmonary embolism . There was stable patchy peribronchovascular interstitial thickening, volume loss and patchy reticulation in the left upper lobe and central left lower lobe, presumably representing stable posttreatment change.  Bilateral lower extremity duplex revealed no evidence of thrombosis.  She was discharged on prednisone, Spiriva, and Levaquin.   She was admitted to [facility] from 11/07/2016 - 11/11/2016 with sepsis and hospital acquired pneumonia.  CXR on 11/09/2016 revealed developing right and possible left sided airspace opacities suspicious for pneumonia.  She had poor oral intake x 3-4 days secondary to nausea and vomiting.  She was treated with  Levaquin.  She has hyperthyroidism.  She is on methimazole.  TSH was 0.037 (low) and free T4 2.27 (high) on 08/01/2016.    Symptomatically, she remains hoarse.  She has unexplained shoulder pain.  She lost weight when hospitalized.   Plan: 1.  Labs today:  CBC with diff, CMP, Mg, CEA. 2.  Discuss PET scan and metabolic lesions in multiple lymph nodes.  Discuss consideration of biopsy.   3.  Cycle #6 Alimta today. 4.  RTC in 10 days for CBC with diff. 5.  RTC in 3 weeks for MD assessment, labs (CBC with diff,CMP, Mg, CEA), and Alimta.  4.  Reschedule appointments with [facility] [phone]). 5.  Rx: oxycodone 10 mg po q 4 hours prn pain. 6.  Rx:  MS Contin 15 mg po q 12 hours 7.  RTC after PET scan.   Lequita Asal, MD  02/01/2017, 1:18 PM

## 2017-02-02 ENCOUNTER — Inpatient Hospital Stay: Payer: Medicaid Other

## 2017-02-02 ENCOUNTER — Other Ambulatory Visit: Payer: Self-pay | Admitting: *Deleted

## 2017-02-02 ENCOUNTER — Ambulatory Visit: Payer: Medicaid Other | Admitting: Hematology and Oncology

## 2017-02-02 ENCOUNTER — Inpatient Hospital Stay: Payer: Medicaid Other | Admitting: Hematology and Oncology

## 2017-02-02 DIAGNOSIS — C3490 Malignant neoplasm of unspecified part of unspecified bronchus or lung: Secondary | ICD-10-CM

## 2017-02-02 MED ORDER — MEGESTROL ACETATE 40 MG/ML PO SUSP: 400.0000 mg | Freq: Two times a day (BID) | ORAL | 1 refills | Status: DC

## 2017-02-02 MED ORDER — MORPHINE SULFATE ER 15 MG PO TBCR: 15.0000 mg | EXTENDED_RELEASE_TABLET | Freq: Two times a day (BID) | ORAL | 0 refills | Status: DC

## 2017-02-02 MED ORDER — OXYCODONE HCL 10 MG PO TABS: 10.0000 mg | ORAL_TABLET | ORAL | 0 refills | Status: DC | PRN

## 2017-02-06 ENCOUNTER — Emergency Department: Payer: Medicaid Other

## 2017-02-06 ENCOUNTER — Encounter: Payer: Self-pay | Admitting: Emergency Medicine

## 2017-02-06 ENCOUNTER — Encounter: Payer: Self-pay | Admitting: Hematology and Oncology

## 2017-02-06 ENCOUNTER — Other Ambulatory Visit: Payer: Self-pay | Admitting: Hematology and Oncology

## 2017-02-06 ENCOUNTER — Other Ambulatory Visit: Payer: Self-pay

## 2017-02-06 ENCOUNTER — Inpatient Hospital Stay: Payer: Medicaid Other

## 2017-02-06 ENCOUNTER — Telehealth: Payer: Self-pay | Admitting: *Deleted

## 2017-02-06 ENCOUNTER — Inpatient Hospital Stay (HOSPITAL_BASED_OUTPATIENT_CLINIC_OR_DEPARTMENT_OTHER): Payer: Medicaid Other | Admitting: Hematology and Oncology

## 2017-02-06 ENCOUNTER — Inpatient Hospital Stay: Payer: Medicaid Other | Attending: Hematology and Oncology

## 2017-02-06 ENCOUNTER — Inpatient Hospital Stay
Admission: EM | Admit: 2017-02-06 | Discharge: 2017-02-21 | DRG: 640 | Disposition: A | Discharge status: Home Health | Payer: Medicaid Other | Attending: Specialist | Admitting: Specialist

## 2017-02-06 VITALS — BP 142/97 | HR 80 | Temp 95.6°F | Wt 111.8 lb

## 2017-02-06 VITALS — BP 127/91 | HR 81

## 2017-02-06 DIAGNOSIS — R1310 Dysphagia, unspecified: Secondary | ICD-10-CM | POA: Diagnosis present

## 2017-02-06 DIAGNOSIS — C3492 Malignant neoplasm of unspecified part of left bronchus or lung: Secondary | ICD-10-CM | POA: Diagnosis present

## 2017-02-06 DIAGNOSIS — Z8701 Personal history of pneumonia (recurrent): Secondary | ICD-10-CM | POA: Insufficient documentation

## 2017-02-06 DIAGNOSIS — J449 Chronic obstructive pulmonary disease, unspecified: Secondary | ICD-10-CM | POA: Diagnosis present

## 2017-02-06 DIAGNOSIS — C770 Secondary and unspecified malignant neoplasm of lymph nodes of head, face and neck: Secondary | ICD-10-CM | POA: Insufficient documentation

## 2017-02-06 DIAGNOSIS — E279 Disorder of adrenal gland, unspecified: Secondary | ICD-10-CM

## 2017-02-06 DIAGNOSIS — Z923 Personal history of irradiation: Secondary | ICD-10-CM | POA: Insufficient documentation

## 2017-02-06 DIAGNOSIS — E0590 Thyrotoxicosis, unspecified without thyrotoxic crisis or storm: Secondary | ICD-10-CM | POA: Insufficient documentation

## 2017-02-06 DIAGNOSIS — R490 Dysphonia: Secondary | ICD-10-CM

## 2017-02-06 DIAGNOSIS — R599 Enlarged lymph nodes, unspecified: Secondary | ICD-10-CM | POA: Insufficient documentation

## 2017-02-06 DIAGNOSIS — E860 Dehydration: Principal | ICD-10-CM | POA: Diagnosis present

## 2017-02-06 DIAGNOSIS — R112 Nausea with vomiting, unspecified: Secondary | ICD-10-CM

## 2017-02-06 DIAGNOSIS — I10 Essential (primary) hypertension: Secondary | ICD-10-CM

## 2017-02-06 DIAGNOSIS — R531 Weakness: Secondary | ICD-10-CM

## 2017-02-06 DIAGNOSIS — C7972 Secondary malignant neoplasm of left adrenal gland: Secondary | ICD-10-CM

## 2017-02-06 DIAGNOSIS — F1210 Cannabis abuse, uncomplicated: Secondary | ICD-10-CM

## 2017-02-06 DIAGNOSIS — F17210 Nicotine dependence, cigarettes, uncomplicated: Secondary | ICD-10-CM | POA: Diagnosis present

## 2017-02-06 DIAGNOSIS — Z9221 Personal history of antineoplastic chemotherapy: Secondary | ICD-10-CM | POA: Diagnosis not present

## 2017-02-06 DIAGNOSIS — Z7952 Long term (current) use of systemic steroids: Secondary | ICD-10-CM

## 2017-02-06 DIAGNOSIS — R410 Disorientation, unspecified: Secondary | ICD-10-CM

## 2017-02-06 DIAGNOSIS — R918 Other nonspecific abnormal finding of lung field: Secondary | ICD-10-CM | POA: Insufficient documentation

## 2017-02-06 DIAGNOSIS — Z7189 Other specified counseling: Secondary | ICD-10-CM

## 2017-02-06 DIAGNOSIS — Z79899 Other long term (current) drug therapy: Secondary | ICD-10-CM | POA: Diagnosis not present

## 2017-02-06 DIAGNOSIS — Z6821 Body mass index (BMI) 21.0-21.9, adult: Secondary | ICD-10-CM

## 2017-02-06 DIAGNOSIS — E876 Hypokalemia: Secondary | ICD-10-CM

## 2017-02-06 DIAGNOSIS — Z515 Encounter for palliative care: Secondary | ICD-10-CM

## 2017-02-06 DIAGNOSIS — M542 Cervicalgia: Secondary | ICD-10-CM | POA: Insufficient documentation

## 2017-02-06 DIAGNOSIS — R109 Unspecified abdominal pain: Secondary | ICD-10-CM | POA: Diagnosis not present

## 2017-02-06 DIAGNOSIS — R443 Hallucinations, unspecified: Secondary | ICD-10-CM | POA: Diagnosis not present

## 2017-02-06 DIAGNOSIS — R627 Adult failure to thrive: Secondary | ICD-10-CM

## 2017-02-06 DIAGNOSIS — Z87891 Personal history of nicotine dependence: Secondary | ICD-10-CM

## 2017-02-06 DIAGNOSIS — C801 Malignant (primary) neoplasm, unspecified: Secondary | ICD-10-CM

## 2017-02-06 DIAGNOSIS — E039 Hypothyroidism, unspecified: Secondary | ICD-10-CM | POA: Diagnosis present

## 2017-02-06 DIAGNOSIS — R634 Abnormal weight loss: Secondary | ICD-10-CM

## 2017-02-06 DIAGNOSIS — K5900 Constipation, unspecified: Secondary | ICD-10-CM | POA: Diagnosis not present

## 2017-02-06 DIAGNOSIS — E872 Acidosis, unspecified: Secondary | ICD-10-CM

## 2017-02-06 DIAGNOSIS — Z809 Family history of malignant neoplasm, unspecified: Secondary | ICD-10-CM | POA: Insufficient documentation

## 2017-02-06 DIAGNOSIS — R4182 Altered mental status, unspecified: Secondary | ICD-10-CM

## 2017-02-06 DIAGNOSIS — E43 Unspecified severe protein-calorie malnutrition: Secondary | ICD-10-CM | POA: Diagnosis present

## 2017-02-06 DIAGNOSIS — F05 Delirium due to known physiological condition: Secondary | ICD-10-CM

## 2017-02-06 DIAGNOSIS — R110 Nausea: Secondary | ICD-10-CM

## 2017-02-06 DIAGNOSIS — R5383 Other fatigue: Secondary | ICD-10-CM

## 2017-02-06 DIAGNOSIS — Z79891 Long term (current) use of opiate analgesic: Secondary | ICD-10-CM

## 2017-02-06 DIAGNOSIS — G9341 Metabolic encephalopathy: Secondary | ICD-10-CM | POA: Diagnosis present

## 2017-02-06 DIAGNOSIS — H9192 Unspecified hearing loss, left ear: Secondary | ICD-10-CM | POA: Insufficient documentation

## 2017-02-06 DIAGNOSIS — C3490 Malignant neoplasm of unspecified part of unspecified bronchus or lung: Secondary | ICD-10-CM

## 2017-02-06 DIAGNOSIS — R29898 Other symptoms and signs involving the musculoskeletal system: Secondary | ICD-10-CM

## 2017-02-06 DIAGNOSIS — E519 Thiamine deficiency, unspecified: Secondary | ICD-10-CM | POA: Diagnosis present

## 2017-02-06 DIAGNOSIS — F329 Major depressive disorder, single episode, unspecified: Secondary | ICD-10-CM | POA: Diagnosis present

## 2017-02-06 DIAGNOSIS — F32A Depression, unspecified: Secondary | ICD-10-CM

## 2017-02-06 LAB — COMPREHENSIVE METABOLIC PANEL
ALT: 8 U/L — ABNORMAL LOW (ref 14–54)
AST: 20 U/L (ref 15–41)
Albumin: 4.1 g/dL (ref 3.5–5.0)
Alkaline Phosphatase: 54 U/L (ref 38–126)
Anion gap: 12 (ref 5–15)
BUN: 9 mg/dL (ref 6–20)
CO2: 22 mmol/L (ref 22–32)
Calcium: 9.4 mg/dL (ref 8.9–10.3)
Chloride: 102 mmol/L (ref 101–111)
Creatinine, Ser: 1.07 mg/dL — ABNORMAL HIGH (ref 0.44–1.00)
GFR calc Af Amer: >60 mL/min (ref >60)
GFR calc non Af Amer: 58 mL/min — ABNORMAL LOW (ref >60)
Glucose, Bld: 88 mg/dL (ref 65–99)
Potassium: 3 mmol/L — ABNORMAL LOW (ref 3.5–5.1)
Sodium: 136 mmol/L (ref 135–145)
Total Bilirubin: 1.2 mg/dL (ref 0.3–1.2)
Total Protein: 8.6 g/dL — ABNORMAL HIGH (ref 6.5–8.1)

## 2017-02-06 LAB — BASIC METABOLIC PANEL
Anion gap: 8 (ref 5–15)
BUN: 7 mg/dL (ref 6–20)
CALCIUM: 7.5 mg/dL — AB (ref 8.9–10.3)
CO2: 17 mmol/L — AB (ref 22–32)
CREATININE: 0.85 mg/dL (ref 0.44–1.00)
Chloride: 115 mmol/L — ABNORMAL HIGH (ref 101–111)
GFR calc non Af Amer: >60 mL/min (ref >60)
Glucose, Bld: 66 mg/dL (ref 65–99)
Potassium: 3.1 mmol/L — ABNORMAL LOW (ref 3.5–5.1)
Sodium: 140 mmol/L (ref 135–145)

## 2017-02-06 LAB — CBC WITH DIFFERENTIAL/PLATELET
Basophils Absolute: 0 10*3/uL (ref 0–0.1)
Basophils Relative: 1 %
Eosinophils Absolute: 0.1 10*3/uL (ref 0–0.7)
Eosinophils Relative: 2 %
HCT: 40.5 % (ref 35.0–47.0)
Hemoglobin: 14.3 g/dL (ref 12.0–16.0)
Lymphocytes Relative: 26 %
Lymphs Abs: 1.3 10*3/uL (ref 1.0–3.6)
MCH: 34.2 pg — ABNORMAL HIGH (ref 26.0–34.0)
MCHC: 35.2 g/dL (ref 32.0–36.0)
MCV: 97.3 fL (ref 80.0–100.0)
Monocytes Absolute: 0.4 10*3/uL (ref 0.2–0.9)
Monocytes Relative: 7 %
Neutro Abs: 3.4 10*3/uL (ref 1.4–6.5)
Neutrophils Relative %: 64 %
Platelets: 378 10*3/uL (ref 150–440)
RBC: 4.17 MIL/uL (ref 3.80–5.20)
RDW: 14.2 % (ref 11.5–14.5)
WBC: 5.2 10*3/uL (ref 3.6–11.0)

## 2017-02-06 LAB — HEPATIC FUNCTION PANEL
ALBUMIN: 3.1 g/dL — AB (ref 3.5–5.0)
ALT: 8 U/L — ABNORMAL LOW (ref 14–54)
AST: 20 U/L (ref 15–41)
Alkaline Phosphatase: 40 U/L (ref 38–126)
BILIRUBIN DIRECT: 0.2 mg/dL (ref 0.1–0.5)
BILIRUBIN TOTAL: 0.9 mg/dL (ref 0.3–1.2)
Indirect Bilirubin: 0.7 mg/dL (ref 0.3–0.9)
Total Protein: 6.5 g/dL (ref 6.5–8.1)

## 2017-02-06 LAB — MAGNESIUM: Magnesium: 1.8 mg/dL (ref 1.7–2.4)

## 2017-02-06 MED ORDER — POTASSIUM CHLORIDE ER 10 MEQ PO TBCR: 10.0000 meq | EXTENDED_RELEASE_TABLET | Freq: Two times a day (BID) | ORAL | 0 refills | Status: DC

## 2017-02-06 MED ORDER — SODIUM CHLORIDE 0.9 % IV BOLUS (SEPSIS)
1000.0000 mL | Freq: Once | INTRAVENOUS | Status: AC
  Administered 2017-02-06: 1000 mL via INTRAVENOUS

## 2017-02-06 MED ORDER — POTASSIUM CHLORIDE 20 MEQ/100ML IV SOLN
20.0000 meq | Freq: Once | INTRAVENOUS | Status: DC
Start: 2017-02-06 — End: 2017-02-06

## 2017-02-06 MED ORDER — POTASSIUM CHLORIDE 2 MEQ/ML IV SOLN
Freq: Once | INTRAVENOUS | Status: AC
  Administered 2017-02-06: 10:00:00 via INTRAVENOUS
  Filled 2017-02-06: qty 100

## 2017-02-06 MED ORDER — IOPAMIDOL (ISOVUE-300) INJECTION 61%
75.0000 mL | Freq: Once | INTRAVENOUS | Status: AC | PRN
  Administered 2017-02-06: 75 mL via INTRAVENOUS

## 2017-02-06 MED ORDER — SODIUM CHLORIDE 0.9 % IV SOLN
Freq: Once | INTRAVENOUS | Status: AC
  Administered 2017-02-06: 10:00:00 via INTRAVENOUS
  Filled 2017-02-06: qty 1000

## 2017-02-06 MED ORDER — DEXTROSE 50 % IV SOLN
25.0000 mL | Freq: Once | INTRAVENOUS | Status: AC
  Administered 2017-02-07: 25 mL via INTRAVENOUS
  Filled 2017-02-06: qty 50

## 2017-02-06 MED ORDER — SODIUM CHLORIDE 0.9 % IV SOLN: Freq: Once | INTRAVENOUS | Status: DC

## 2017-02-06 MED ORDER — ONDANSETRON 8 MG PO TBDP
8.0000 mg | ORAL_TABLET | Freq: Once | ORAL | Status: AC
  Administered 2017-02-06: 8 mg via ORAL
  Filled 2017-02-06: qty 1

## 2017-02-06 NOTE — Progress Notes (Signed)
1355- Patient taken to bathroom several times and states unable to urinate.  MD aware.  Encourage patient to drinks more fluids until urine obtained.  Patient refused more IV fluids and left the infusion area.  No urine obtained.

## 2017-02-06 NOTE — ED Provider Notes (Signed)
[facility] Emergency Department Provider Note    First MD Initiated Contact with Patient 02/06/17 2230     (approximate)  I have reviewed the triage vital signs and the nursing notes.   HISTORY  Chief Complaint Weakness    HPI Audrey Mcgee is a 56 y.o. female history of COPD as well as metastatic lung cancer presents with nausea, dizziness and lightheadedness. She states that she went for acute COPD daily however on review of her chart it does not appear that she actually got any dose today. Patient providing waxing waning stories and changing the timing of her chemotherapy doses. She is here with her boyfriend who states that she has been more confused lately.He also endorses that she's had decreased oral intake. No fevers. No cough, chest pain or shortness of breath.   Past Medical History:  Diagnosis Date  . COPD (chronic obstructive pulmonary disease) ([place])   . Hypertension   . Lung cancer ([place])   . Thyroid disease    Family History  Problem Relation Age of Onset  . Hypertension Father   . Cancer Father   . Diabetes Father   . Cancer Maternal Aunt    Past Surgical History:  Procedure Laterality Date  . CESAREAN SECTION CLASSICAL    . STOMACH SURGERY     Pt reports for a tumor   Patient Active Problem List   Diagnosis Date Noted  . Encounter for antineoplastic chemotherapy 02/01/2017  . 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 Sepsis ([place]) 11/07/2016  . Protein-calorie malnutrition, severe 11/07/2016  . Acute respiratory distress 06/21/2016  . COPD exacerbation ([place]) 06/21/2016  . Sinus tachycardia 06/21/2016  . Recurrent aspiration bronchitis/pneumonia ([place]) 06/21/2016  . Dysphagia 06/21/2016  . Nausea and vomiting 06/21/2016  . Dental abscess 05/10/2016  . Swallowing difficulty 03/28/2016  . Hypomagnesemia 02/29/2016  . Hoarseness 02/18/2016  . Cancer related pain 07/08/2015  . Adenocarcinoma, lung ([place])  03/26/2015  . 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 Ear ache 10/03/2014  . Abscess of buccal cavity 10/03/2014  . Lump in neck 10/03/2014  . Laryngeal pain 10/03/2014      Prior to Admission medications   Medication Sig Start Date End Date Taking? Authorizing Provider  benzonatate (TESSALON) 100 MG capsule Take 1 capsule (100 mg total) by mouth 3 (three) times daily as needed for cough. 09/24/15   Lequita Asal, MD  dexamethasone (DECADRON) 4 MG tablet Take '4mg'$  BID the day before and day after chemotherapy. Patient taking differently: Take 4 mg by mouth 2 (two) times daily. Take '4mg'$  BID the day before and day after chemotherapy. 10/31/16   Lequita Asal, MD  docusate sodium (COLACE) 100 MG capsule Take 100 mg by mouth 2 (two) times daily as needed for mild constipation.    Historical Provider, MD  folic acid (FOLVITE) 621 MCG tablet Take 800 mcg by mouth daily.     Historical Provider, MD  levofloxacin (LEVAQUIN) 750 MG tablet Take 1 tablet (750 mg total) by mouth daily. Patient not taking: Reported on 12/23/2016 11/11/16   Hillary Bow, MD  lidocaine (XYLOCAINE) 2 % solution Use as directed 10 mLs in the mouth or throat every 4 (four) hours as needed for mouth pain.    Historical Provider, MD  losartan (COZAAR) 100 MG tablet Take 1 tablet (100 mg total) by mouth daily. 06/22/16   [name], MD  magic mouthwash w/lidocaine SOLN Take 5 mLs by mouth 4 (  four) times daily as needed for mouth pain. 08/31/15   Lequita Asal, MD  magnesium oxide (MAG-OX) 400 MG tablet Take 1 tablet (400 mg total) by mouth daily. 04/17/16   Lequita Asal, MD  megestrol (MEGACE) 40 MG/ML suspension Take 10 mLs (400 mg total) by mouth 2 (two) times daily. 02/02/17   Lequita Asal, MD  methimazole (TAPAZOLE) 5 MG tablet take 1 tablet by mouth twice a day for OVERACTIVE THYROID 08/08/16   Historical Provider, MD  metoprolol (LOPRESSOR) 50 MG tablet take 1 tablet by mouth  twice a day NOTE DOSE INCREASE 08/07/16   Historical Provider, MD  morphine (MS CONTIN) 15 MG 12 hr tablet Take 1 tablet (15 mg total) by mouth 2 (two) times daily. 02/02/17   Lequita Asal, MD  omeprazole (PRILOSEC) 20 MG capsule Take 1 capsule (20 mg total) by mouth daily. 06/20/16   Lequita Asal, MD  ondansetron (ZOFRAN) 8 MG tablet Take 1 tablet (8 mg total) by mouth 2 (two) times daily as needed for nausea or vomiting. 08/22/16   Lequita Asal, MD  Oxycodone HCl 10 MG TABS Take 1 tablet (10 mg total) by mouth every 4 (four) hours as needed (pain). 02/02/17   Lequita Asal, MD  potassium chloride (K-DUR) 10 MEQ tablet Take 1 tablet (10 mEq total) by mouth 2 (two) times daily. For 3 days 02/06/17   Lequita Asal, MD  predniSONE (DELTASONE) 20 MG tablet Take 2 tablets (40 mg total) by mouth daily with breakfast. 11/11/16   Hillary Bow, MD  tiotropium (SPIRIVA) 18 MCG inhalation capsule Place 1 capsule (18 mcg total) into inhaler and inhale daily. 06/22/16   [name], MD    Allergies No known allergies    Social History Social History  Substance Use Topics  . Smoking status: Current Every Day Smoker    Years: 15.00    Types: Cigarettes  . Smokeless tobacco: Never Used  . Alcohol use 1.2 oz/week    2 Cans of beer per week    Review of Systems Patient denies headaches, rhinorrhea, blurry vision, numbness, shortness of breath, chest pain, edema, cough, abdominal pain, nausea, vomiting, diarrhea, dysuria, fevers, rashes or hallucinations unless otherwise stated above in HPI. ____________________________________________   PHYSICAL EXAM:  VITAL SIGNS: Vitals:   02/06/17 2215 02/06/17 2300  BP:  137/90  Pulse:  83  Resp:  (!) 23  Temp: 98.5 F (36.9 C)     Constitutional: Alert and oriented. Ill and frail appearing but in no acute distress. Eyes: Conjunctivae are normal. PERRL. EOMI. Head: Atraumatic. Nose: No congestion/rhinnorhea. Mouth/Throat:  Mucous membranes are moist.  Oropharynx non-erythematous. Neck: No stridor. Painless ROM. No cervical spine tenderness to palpation Hematological/Lymphatic/Immunilogical: + right cervical lymphadenopathy. Cardiovascular: Normal rate, regular rhythm. Grossly normal heart sounds.  Good peripheral circulation. Respiratory: Normal respiratory effort.  No retractions. Lungs CTAB. Gastrointestinal: Soft and nontender. No distention. No abdominal bruits. No CVA tenderness. Genitourinary:  Musculoskeletal: No lower extremity tenderness nor edema.  No joint effusions. Neurologic:  Normal speech and language. No gross focal neurologic deficits are appreciated.  Skin:  Skin is warm, dry and intact. No rash noted. Psychiatric: Mood and affect are normal. Speech and behavior are normal.  ____________________________________________   LABS (all labs ordered are listed, but only abnormal results are displayed)  Results for orders placed or performed during the hospital encounter of 02/06/17 (from the past 24 hour(s))  Basic metabolic panel  Status: Abnormal   Collection Time: 02/06/17 10:31 PM  Result Value Ref Range   Sodium 140 135 - 145 mmol/L   Potassium 3.1 (L) 3.5 - 5.1 mmol/L   Chloride 115 (H) 101 - 111 mmol/L   CO2 17 (L) 22 - 32 mmol/L   Glucose, Bld 66 65 - 99 mg/dL   BUN 7 6 - 20 mg/dL   Creatinine, Ser 0.85 0.44 - 1.00 mg/dL   Calcium 7.5 (L) 8.9 - 10.3 mg/dL   GFR calc non Af Amer >60 >60 mL/min   GFR calc Af Amer >60 >60 mL/min   Anion gap 8 5 - 15  Hepatic function panel     Status: Abnormal   Collection Time: 02/06/17 10:31 PM  Result Value Ref Range   Total Protein 6.5 6.5 - 8.1 g/dL   Albumin 3.1 (L) 3.5 - 5.0 g/dL   AST 20 15 - 41 U/L   ALT 8 (L) 14 - 54 U/L   Alkaline Phosphatase 40 38 - 126 U/L   Total Bilirubin 0.9 0.3 - 1.2 mg/dL   Bilirubin, Direct 0.2 0.1 - 0.5 mg/dL   Indirect Bilirubin 0.7 0.3 - 0.9 mg/dL    ____________________________________________  EKG My review and personal interpretation at Time: 22:01   Indication: weakness  Rate: 85  Rhythm: sinus Axis: normal Other: no acute ischemia ____________________________________________  RADIOLOGY  I personally reviewed all radiographic images ordered to evaluate for the above acute complaints and reviewed radiology reports and findings.  These findings were personally discussed with the patient.  Please see medical record for radiology report.  ____________________________________________   PROCEDURES  Procedure(s) performed:  Procedures    Critical Care performed: no ____________________________________________   INITIAL IMPRESSION / ASSESSMENT AND PLAN / ED COURSE  Pertinent labs & imaging results that were available during my care of the patient were reviewed by me and considered in my medical decision making (see chart for details).  DDX: Dehydration, sepsis, pneumonia, O abnormality, failure to thrive, acidosis  Liam Bossman is a 56 y.o. who presents to the ED with weakness and nausea and confusion. Patient with no focal neuro deficits but given her metastatic lung disease will order CT imaging of the head to evaluate for any metastatic disease and she does appear confused. Does appear dehydrated. We'll check labs. No evidence of fever.  The patient will be placed on continuous pulse oximetry and telemetry for monitoring.  Laboratory evaluation will be sent to evaluate for the above complaints.     Clinical Course as of Feb 06 2354  Fri Feb 06, 2017  2328 Blood work with Evidence of dehydration with metabolic acidosis.  [PR]  2329 Will continue IV fluid administration. CT imaging of the head pending due to concern for metastatic disease.  Patient will be signed out to Dr. Owens Shark pending results of CT imaging.  Have discussed with the patient and available family all diagnostics and treatments performed thus far  and all questions were answered to the best of my ability. The patient demonstrates understanding and agreement with plan.   [PR]    Clinical Course User Index [PR] Merlyn Lot, MD     ____________________________________________   FINAL CLINICAL IMPRESSION(S) / ED DIAGNOSES  Final diagnoses:  Weakness  Dehydration  Metabolic acidosis      NEW MEDICATIONS STARTED DURING THIS VISIT:  New Prescriptions   No medications on file     Note:  This document was prepared using Dragon voice recognition software  and may include unintentional dictation errors.    Merlyn Lot, MD 02/06/17 [phone]

## 2017-02-06 NOTE — ED Triage Notes (Signed)
Pt to triage via [facility], reports cancer pt w/ last chemo on Monday.  Pt reports nausea, weakness, lightheadedness starting today.  Pt reports intermittent head and back pain but states this has been an ongoing problem.  Pt NAD at this time.

## 2017-02-06 NOTE — Progress Notes (Signed)
Having nausea and vomiting for 2 days, last bm 1 week ago, reports decreased appetite, reports voiding WNL, reports being cold all the time.

## 2017-02-06 NOTE — Telephone Encounter (Signed)
Called patient and left a message that we called a prescription in to [facility] for potassium bid for 3 days and if she has questions to call the clinic.

## 2017-02-06 NOTE — Progress Notes (Addendum)
[facility] day:  02/06/2017   Chief Complaint: Audrey Mcgee is a 56 y.o. female with metastatic poorly differentiated non-small cell lung cancer who is seen for review of interval PET scan and assessment prior to cycle #7 Alimta.  HPI: The patient was last seen in the medical oncology clinic by me on 12/23/2016.  At that time, she remained hoarse.  She had unexplained shoulder pain.  She had lost weight when hospitalized.   Appointment with ENT was rescheduled.  Restaging PET scan was ordered.  PET scan was delayed secondary to insurance.  PET scan on 01/29/2017 revealed abnormal hypermetabolic adenopathy in the neck, chest, and upper abdomen with probable involvement of the left adrenal gland, suspicious for recurrent metastatic lung cancer.  A new primary malignancy such as lymphoma was not entirely excluded but seems less likely.  There was stable sub solid nodularity in the left upper lobe with a new partially solid 7 mm nodule in the left lower lobe, not currently hypermetabolic.  She came by clinic after her scan to pick up her pain medications.  We discussed consideration of biopsy of a cervical lymph node to confirm that the disease identified by PET scan was metastatic lung cancer rather than another diagnosis such as lymphoma.  She declined, but would agree to biopsy if her lymph nodes grew on therapy.  Symptomatically, she is weak today. She had a runny nose last week and cough. She sometimes feels achy. She does not feel like she has the flu. Her pain in her side. She denies any diarrhea. She has had constipation for 4 days. She has had nausea and vomiting for 3 days.   Past Medical History:  Diagnosis Date  . COPD (chronic obstructive pulmonary disease) ([place])   . Hypertension   . Lung cancer ([place])   . Thyroid disease     Past Surgical History:  Procedure Laterality Date  . CESAREAN SECTION CLASSICAL    . STOMACH SURGERY      Pt reports for a tumor    Family History  Problem Relation Age of Onset  . Hypertension Father   . Cancer Father   . Diabetes Father   . Cancer Maternal Aunt     Social History:  reports that she has quit smoking. Her smoking use included Cigarettes. She quit after 15.00 years of use. She has never used smokeless tobacco. She reports that she does not drink alcohol or use drugs.  She stopped smoking.  Her nephew shot in [place] (caused delays in her treatment).  Her boyfriend's father  died of metastatic lung cancer.  Her boyfriend works at a school with children.  The patient's 1st cousin died (age 55) of a cerebral aneurysm.  The patient is accompanied by her friend, Linton Rump, today.    Allergies:  Allergies  Allergen Reactions  . No Known Allergies     Current Medications: Current Outpatient Prescriptions  Medication Sig Dispense Refill  . benzonatate (TESSALON) 100 MG capsule Take 1 capsule (100 mg total) by mouth 3 (three) times daily as needed for cough. 20 capsule 0  . dexamethasone (DECADRON) 4 MG tablet Take 5m BID the day before and day after chemotherapy. (Patient taking differently: Take 4 mg by mouth 2 (two) times daily. Take 44mBID the day before and day after chemotherapy.) 20 tablet 0  . docusate sodium (COLACE) 100 MG capsule Take 100 mg by mouth 2 (two) times daily as needed for  mild constipation.    . folic acid (FOLVITE) 568 MCG tablet Take 800 mcg by mouth daily.     [name] lidocaine (XYLOCAINE) 2 % solution Use as directed 10 mLs in the mouth or throat every 4 (four) hours as needed for mouth pain.    [name] losartan (COZAAR) 100 MG tablet Take 1 tablet (100 mg total) by mouth daily. 30 tablet 0  . magic mouthwash w/lidocaine SOLN Take 5 mLs by mouth 4 (four) times daily as needed for mouth pain. 480 mL 1  . magnesium oxide (MAG-OX) 400 MG tablet Take 1 tablet (400 mg total) by mouth daily. 30 tablet 0  . megestrol (MEGACE) 40 MG/ML suspension Take 10 mLs (400 mg  total) by mouth 2 (two) times daily. 300 mL 1  . methimazole (TAPAZOLE) 5 MG tablet take 1 tablet by mouth twice a day for OVERACTIVE THYROID  0  . metoprolol (LOPRESSOR) 50 MG tablet take 1 tablet by mouth twice a day NOTE DOSE INCREASE  0  . morphine (MS CONTIN) 15 MG 12 hr tablet Take 1 tablet (15 mg total) by mouth 2 (two) times daily. 30 tablet 0  . omeprazole (PRILOSEC) 20 MG capsule Take 1 capsule (20 mg total) by mouth daily. 30 capsule 3  . ondansetron (ZOFRAN) 8 MG tablet Take 1 tablet (8 mg total) by mouth 2 (two) times daily as needed for nausea or vomiting. 30 tablet 1  . Oxycodone HCl 10 MG TABS Take 1 tablet (10 mg total) by mouth every 4 (four) hours as needed (pain). 30 tablet 0  . predniSONE (DELTASONE) 20 MG tablet Take 2 tablets (40 mg total) by mouth daily with breakfast. 8 tablet 0  . tiotropium (SPIRIVA) 18 MCG inhalation capsule Place 1 capsule (18 mcg total) into inhaler and inhale daily. 30 capsule 1  . levofloxacin (LEVAQUIN) 750 MG tablet Take 1 tablet (750 mg total) by mouth daily. (Patient not taking: Reported on 12/23/2016) 4 tablet 0   No current facility-administered medications for this visit.     Review of Systems:  GENERAL: Feels weak. No fevers or chills. Weight loss of 14 pounds in 6 weeks. PERFORMANCE STATUS (ECOG): 1 HEENT: Chronic hoarseness.  Runny nose last week.  No sore throat, mouth sores or tenderness. Lungs:  Shortness of breath with exertion (stable).  Chronic cough.  No hemoptysis. Cardiac: No chest pain, palpitations, orthopnea, or PND. GI: Constipation. Nausea and vomiting x 3 days.  No diarrhea, melena or hematochezia. GU: No urgency, frequency, dysuria, or hematuria. Musculoskeletal: Right shoulder and side pain.No muscle tenderness. Extremities: No pain or swelling. Skin: No rashes or skin changes. Neuro:Generally weak.  No headache, numbness or weakness, balance or coordination issues. Endocrine: No diabetes.  Thyroid  issues.  No hot flashes or night sweats. Psych: No mood changes, depression or anxiety. Pain: Pain in shoulder and side (unchanged). Review of systems: All other systems reviewed and found to be negative.  Physical Exam: Blood pressure 128/89, pulse 82, temperature (!) 95.6 F (35.3 C), temperature source Tympanic, weight 111 lb 12.4 oz (50.7 kg), last menstrual period 01/30/2008.  GENERAL: Thin fatigued/sleepy appearing woman sitting in a wheelchair under a blanket in the exam room in no acute distress.  She is retching.  She needs assistance onto the exam table. MENTAL STATUS: Alert and oriented to person, place and time. HEAD: Short dark styled wig Normocephalic, atraumatic, no Cushingoid features.  EYES: Brown eyes. Ruddy sclera.  Pupils equal round and reactive to light  and accomodation. No conjunctivitis or scleral icterus. ENT: Hoarseness, improved.  Poor dentition.  Oropharynx clear without lesion. Tongue normal. Mucous membranes moist.  RESPIRATORY: Clear to auscultation without rales, wheezes or rhonchi. CARDIOVASCULAR: Regular rate and rhythm without murmur, rub or gallop. ABDOMEN: Soft, non-tender, with active bowel sounds, and no hepatosplenomegaly. No masses. SKIN: No rashes.  No ulcers or lesions. EXTREMITIES: No edema, skin discoloration or tenderness. No palpable cords. LYMPH NODES: No palpable cervical, supraclavicular, axillary or inguinal adenopathy  NEUROLOGICAL: Unremarkable. PSYCH: Appropriate.   Appointment on 02/06/2017  Component Date Value Ref Range Status  . WBC 02/06/2017 5.2  3.6 - 11.0 K/uL Final  . RBC 02/06/2017 4.17  3.80 - 5.20 MIL/uL Final  . Hemoglobin 02/06/2017 14.3  12.0 - 16.0 g/dL Final  . HCT 02/06/2017 40.5  35.0 - 47.0 % Final  . MCV 02/06/2017 97.3  80.0 - 100.0 fL Final  . MCH 02/06/2017 34.2* 26.0 - 34.0 pg Final  . MCHC 02/06/2017 35.2  32.0 - 36.0 g/dL Final  . RDW 02/06/2017 14.2  11.5 - 14.5 % Final  . Platelets  02/06/2017 378  150 - 440 K/uL Final  . Neutrophils Relative % 02/06/2017 64  % Final  . Neutro Abs 02/06/2017 3.4  1.4 - 6.5 K/uL Final  . Lymphocytes Relative 02/06/2017 26  % Final  . Lymphs Abs 02/06/2017 1.3  1.0 - 3.6 K/uL Final  . Monocytes Relative 02/06/2017 7  % Final  . Monocytes Absolute 02/06/2017 0.4  0.2 - 0.9 K/uL Final  . Eosinophils Relative 02/06/2017 2  % Final  . Eosinophils Absolute 02/06/2017 0.1  0 - 0.7 K/uL Final  . Basophils Relative 02/06/2017 1  % Final  . Basophils Absolute 02/06/2017 0.0  0 - 0.1 K/uL Final  . Sodium 02/06/2017 136  135 - 145 mmol/L Final  . Potassium 02/06/2017 3.0* 3.5 - 5.1 mmol/L Final  . Chloride 02/06/2017 102  101 - 111 mmol/L Final  . CO2 02/06/2017 22  22 - 32 mmol/L Final  . Glucose, Bld 02/06/2017 88  65 - 99 mg/dL Final  . BUN 02/06/2017 9  6 - 20 mg/dL Final  . Creatinine, Ser 02/06/2017 1.07* 0.44 - 1.00 mg/dL Final  . Calcium 02/06/2017 9.4  8.9 - 10.3 mg/dL Final  . Total Protein 02/06/2017 8.6* 6.5 - 8.1 g/dL Final  . Albumin 02/06/2017 4.1  3.5 - 5.0 g/dL Final  . AST 02/06/2017 20  15 - 41 U/L Final  . ALT 02/06/2017 8* 14 - 54 U/L Final  . Alkaline Phosphatase 02/06/2017 54  38 - 126 U/L Final  . Total Bilirubin 02/06/2017 1.2  0.3 - 1.2 mg/dL Final  . GFR calc non Af Amer 02/06/2017 58* >60 mL/min Final  . GFR calc Af Amer 02/06/2017 >60  >60 mL/min Final   Comment: (NOTE) The eGFR has been calculated using the CKD EPI equation. This calculation has not been validated in all clinical situations. eGFR's persistently <60 mL/min signify possible Chronic Kidney Disease.   . Anion gap 02/06/2017 12  5 - 15 Final  . Magnesium 02/06/2017 1.8  1.7 - 2.4 mg/dL Final    Assessment:  Chardonnay Holzmann is a 56 y.o. female with metastatic poorly differentiated adenocarcinoma of lung. She presented with a 5 month history of enlarging bilateral neck masses, progressive hoarseness, dysphagia, dyspnea on exertion, cough,  left sided hearing loss, neck pain, and a 30 pound weight loss.  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 Biopsy of the right supraclavicular lymph node on 10/03/2014 revealed poorly differentiated non-small cell carcinoma, favoring adenocarcinoma consistent with lung primary (TTF-1 positive). KRAS was positive.   PET scan on 10/24/2014 revealed a left hilar mass with associated left upper lobe obstruction/atelectasis with extensive bilateral mediastinal and cervical adenopathy. Chest CT from 10/10/2014 also noted a 2.2 x 0.9 cm soft tissue density with internal calcifications in the right breast. Head MRI revealed no evidence of metastatic disease.  She received palliative radiation (3000 cGy) to the left lung from 10/24/2014 - 11/07/2014. She received 6 cycles carboplatin and Alimta (11/14/2014 - 04/30/2015). There was a delay between cycle #2 and cycle #3 secondary to a switch in caregivers.  She was diagnosed with a left peri-hilar infiltrate on 02/23/2015. She has completed a course of Levaquin. Chest CT on 03/21/2015 revealed LUL scarring, patchy ill defined airspace opacities in the lingula, upper esophageal thickening, and small mediastinal and hilar nodes. She completed a course of Azithromycin.  She received 2 cycles of maintenance Alimta (05/22/2015 - 06/15/2015). Chest CT on 07/09/2015 revealed patchy ground glass and nodular consolidation in the left upper lobe and to a lesser extent left lower lobe, minimally progressive from 03/21/2015.  Bone scan on 07/09/2015 revealed no evidence of metastatic disease.  CXR on 09/24/2015 revealed patchy left perihilar airspace opacities.  She was treated with azithromycin.  Chest CT on 10/17/2015 revealed patchy ground-glass and ill-defined nodular areas of opacity in the left apex, lingula, and anterior left lower lobe generally appeared decreased in the interval although 1 of the nodular areas in the  left apex has progressed slightly since the previous study.  Overall imaging features are felt to be most likely treatment related.  There had been an interval slight decrease in proximal to mid esophageal circumferential wall thickening, potentially treatment related.   She was lost to follow-up for approximately 3 months.  Chest and abdomen CT scan on 02/04/2016 revealed evolving radiation changes in the left upper and left lower lobes. Previously measured left upper lobe nodule was no longer visualized. There was no evidence of metastatic disease.  Right hilar lymph nodes were not considered enlarged by CT size criteria and are nonspecific, but appeared new from the prior exam.  Bone scan on 02/19/2016 revealed no definite evidence of metastatic disease. There was subtle uptake within the posterior aspects of the upper ribs bilaterally (nonspecific and not entirely new).  There was a tiny focus of increased uptake near the vertex of the calvarium (new).  She has been hoarse since 02/01/2016.  She has ongoing hoarseness and difficulty swallowing.  She has not had her ENT or swallowing evaluations.  She has had delays in therapy secondary to tooth abscesses.  She has had another delay in therapy secondary to "the flu".   She has received 6 cycles of maintenance Alimta (restarted 02/29/2016 - 06/06/2016; 08/01/2016; 10/31/2016).  She receives B12 every 9 weeks (last 12/23/2016) and is on daily folic acid.   CEA has been monitored: 3.3 on 02/29/2016, 4.1 on 03/28/2016, 4.2 on 04/17/2016, 5.0 on 08/22/2016 and 9.4 on 10/31/2016.  PET scan on 01/29/2017 revealed abnormal hypermetabolic adenopathy in the neck, chest, and upper abdomen with probable involvement of the left adrenal gland, suspicious for recurrent metastatic lung cancer.  There was stable sub solid nodularity in the left upper lobe with a new partially solid 7 mm nodule in the left lower lobe.  She was admitted to [facility] from 06/21/2016 -  06/22/2016 with severe shortness of breath secondary to possible aspiration.  Chest CT angiogram on 06/21/2016 revealed no evidence of pulmonary embolism . There was stable patchy peribronchovascular interstitial thickening, volume loss and patchy reticulation in the left upper lobe and central left lower lobe, presumably representing stable posttreatment change.  Bilateral lower extremity duplex revealed no evidence of thrombosis.  She was discharged on prednisone, Spiriva, and Levaquin.   She was admitted to [facility] from 11/07/2016 - 11/11/2016 with sepsis and hospital acquired pneumonia.  CXR on 11/09/2016 revealed developing right and possible left sided airspace opacities suspicious for pneumonia.  She had poor oral intake x 3-4 days secondary to nausea and vomiting.  She was treated with Levaquin.  She has hyperthyroidism.  She is on methimazole.  TSH was 0.037 (low) and free T4 2.27 (high) on 08/01/2016.    Symptomatically, she is weak and fatigued.  She has a 3 day history of poor oral intake and nausea/voimting.  Plan: 1.  Labs today:  CBC with diff, CMP, Mg, CEA. 2.  Discuss PET scan and metabolic lesions in multiple lymph nodes.  Reviewed conversation last week (patient defers biopsy).   3.  Discuss no chemotherapy today secondary to weakness.  Possible gastroenteritis.  Discuss checking orthostatics and giving IVF. 4.  Orthostatics + IVF, 20 meq KCL, antiemetics. Repeat orthostatics after fluid. 5.  Collect urine for UA/culture + urine drug screen 6.  RTC on 02/09/2017 for MD assessment, labs (CBC with diff, CMP) +/- Alimta and IVF.  Addendum:  Patient received IVF.  She did not provide a urine specimen.  Additional fluids were recommended.  Patient left clinic.  A prescription was sent to her pharmacy for oral potassium.   Lequita Asal, MD  02/06/2017, 9:24 AM

## 2017-02-07 ENCOUNTER — Observation Stay: Payer: Medicaid Other

## 2017-02-07 ENCOUNTER — Encounter: Payer: Self-pay | Admitting: Hematology and Oncology

## 2017-02-07 DIAGNOSIS — R634 Abnormal weight loss: Secondary | ICD-10-CM | POA: Insufficient documentation

## 2017-02-07 DIAGNOSIS — E860 Dehydration: Secondary | ICD-10-CM | POA: Diagnosis present

## 2017-02-07 LAB — BASIC METABOLIC PANEL
ANION GAP: 10 (ref 5–15)
CALCIUM: 8.4 mg/dL — AB (ref 8.9–10.3)
CO2: 21 mmol/L — ABNORMAL LOW (ref 22–32)
CREATININE: 0.98 mg/dL (ref 0.44–1.00)
Chloride: 107 mmol/L (ref 101–111)
GFR calc Af Amer: >60 mL/min (ref >60)
GLUCOSE: 68 mg/dL (ref 65–99)
Potassium: 3.1 mmol/L — ABNORMAL LOW (ref 3.5–5.1)
Sodium: 138 mmol/L (ref 135–145)

## 2017-02-07 LAB — CBC
HCT: 33.2 % — ABNORMAL LOW (ref 35.0–47.0)
HEMATOCRIT: 38.7 % (ref 35.0–47.0)
Hemoglobin: 11.4 g/dL — ABNORMAL LOW (ref 12.0–16.0)
Hemoglobin: 12.8 g/dL (ref 12.0–16.0)
MCH: 34.1 pg — ABNORMAL HIGH (ref 26.0–34.0)
MCH: 33 pg (ref 26.0–34.0)
MCHC: 34.5 g/dL (ref 32.0–36.0)
MCHC: 33.2 g/dL (ref 32.0–36.0)
MCV: 99 fL (ref 80.0–100.0)
MCV: 99.5 fL (ref 80.0–100.0)
PLATELETS: 251 10*3/uL (ref 150–440)
PLATELETS: 306 10*3/uL (ref 150–440)
RBC: 3.35 MIL/uL — ABNORMAL LOW (ref 3.80–5.20)
RBC: 3.89 MIL/uL (ref 3.80–5.20)
RDW: 14.3 % (ref 11.5–14.5)
RDW: 14 % (ref 11.5–14.5)
WBC: 4.8 10*3/uL (ref 3.6–11.0)
WBC: 5.8 10*3/uL (ref 3.6–11.0)

## 2017-02-07 LAB — URINE DRUG SCREEN, QUALITATIVE (ARMC ONLY)
Amphetamines, Ur Screen: NOT DETECTED
BARBITURATES, UR SCREEN: NOT DETECTED
BENZODIAZEPINE, UR SCRN: NOT DETECTED
CANNABINOID 50 NG, UR ~~LOC~~: POSITIVE — AB
Cocaine Metabolite,Ur ~~LOC~~: NOT DETECTED
MDMA (Ecstasy)Ur Screen: NOT DETECTED
METHADONE SCREEN, URINE: NOT DETECTED
OPIATE, UR SCREEN: POSITIVE — AB
PHENCYCLIDINE (PCP) UR S: NOT DETECTED
Tricyclic, Ur Screen: NOT DETECTED

## 2017-02-07 LAB — URINALYSIS, COMPLETE (UACMP) WITH MICROSCOPIC
Bilirubin Urine: NEGATIVE
GLUCOSE, UA: 50 mg/dL — AB
HGB URINE DIPSTICK: NEGATIVE
Ketones, ur: 5 mg/dL — AB
Leukocytes, UA: NEGATIVE
Nitrite: NEGATIVE
Protein, ur: NEGATIVE mg/dL
SPECIFIC GRAVITY, URINE: 1.027 (ref 1.005–1.030)
pH: 5 (ref 5.0–8.0)

## 2017-02-07 LAB — INFLUENZA PANEL BY PCR (TYPE A & B)
INFLBPCR: NEGATIVE
Influenza A By PCR: NEGATIVE

## 2017-02-07 LAB — CEA: CEA: 8.8 ng/mL — ABNORMAL HIGH (ref 0.0–4.7)

## 2017-02-07 LAB — LACTIC ACID, PLASMA: Lactic Acid, Venous: 1.6 mmol/L (ref 0.5–1.9)

## 2017-02-07 LAB — TSH: TSH: 77.876 u[IU]/mL — ABNORMAL HIGH (ref 0.350–4.500)

## 2017-02-07 LAB — GLUCOSE, CAPILLARY: Glucose-Capillary: 147 mg/dL — ABNORMAL HIGH (ref 65–99)

## 2017-02-07 LAB — MAGNESIUM: Magnesium: 1.5 mg/dL — ABNORMAL LOW (ref 1.7–2.4)

## 2017-02-07 MED ORDER — MEGESTROL ACETATE 400 MG/10ML PO SUSP
400.0000 mg | Freq: Two times a day (BID) | ORAL | Status: DC
  Administered 2017-02-07 – 2017-02-21 (×24): 400 mg via ORAL
  Filled 2017-02-07 (×32): qty 10

## 2017-02-07 MED ORDER — SODIUM CHLORIDE 0.9 % IV BOLUS (SEPSIS)
1000.0000 mL | Freq: Once | INTRAVENOUS | Status: AC
  Administered 2017-02-07: 1000 mL via INTRAVENOUS

## 2017-02-07 MED ORDER — ONDANSETRON HCL 4 MG/2ML IJ SOLN
INTRAMUSCULAR | Status: AC
  Filled 2017-02-07: qty 2

## 2017-02-07 MED ORDER — POTASSIUM CHLORIDE ER 10 MEQ PO TBCR: 10.0000 meq | EXTENDED_RELEASE_TABLET | Freq: Two times a day (BID) | ORAL | Status: DC

## 2017-02-07 MED ORDER — DOCUSATE SODIUM 100 MG PO CAPS
100.0000 mg | ORAL_CAPSULE | Freq: Two times a day (BID) | ORAL | Status: DC
  Administered 2017-02-07 – 2017-02-11 (×7): 100 mg via ORAL
  Filled 2017-02-07 (×8): qty 1

## 2017-02-07 MED ORDER — MAGNESIUM OXIDE 400 (241.3 MG) MG PO TABS
400.0000 mg | ORAL_TABLET | Freq: Every day | ORAL | Status: DC
  Administered 2017-02-07 – 2017-02-10 (×4): 400 mg via ORAL
  Filled 2017-02-07 (×5): qty 1

## 2017-02-07 MED ORDER — MAGIC MOUTHWASH W/LIDOCAINE: 5.0000 mL | Freq: Four times a day (QID) | ORAL | Status: DC | PRN

## 2017-02-07 MED ORDER — FOLIC ACID 800 MCG PO TABS: 800.0000 ug | ORAL_TABLET | Freq: Every day | ORAL | Status: DC

## 2017-02-07 MED ORDER — PANTOPRAZOLE SODIUM 40 MG PO TBEC
40.0000 mg | DELAYED_RELEASE_TABLET | Freq: Every day | ORAL | Status: DC
  Administered 2017-02-07 – 2017-02-13 (×6): 40 mg via ORAL
  Filled 2017-02-07 (×8): qty 1

## 2017-02-07 MED ORDER — TIOTROPIUM BROMIDE MONOHYDRATE 18 MCG IN CAPS
18.0000 ug | ORAL_CAPSULE | Freq: Every day | RESPIRATORY_TRACT | Status: DC
  Administered 2017-02-07 – 2017-02-21 (×13): 18 ug via RESPIRATORY_TRACT
  Filled 2017-02-07 (×3): qty 5

## 2017-02-07 MED ORDER — ENOXAPARIN SODIUM 40 MG/0.4ML ~~LOC~~ SOLN
40.0000 mg | SUBCUTANEOUS | Status: DC
  Administered 2017-02-07 – 2017-02-20 (×13): 40 mg via SUBCUTANEOUS
  Filled 2017-02-07 (×15): qty 0.4

## 2017-02-07 MED ORDER — LIDOCAINE VISCOUS 2 % MT SOLN
10.0000 mL | OROMUCOSAL | Status: DC | PRN
  Filled 2017-02-07: qty 10

## 2017-02-07 MED ORDER — ONDANSETRON HCL 4 MG/2ML IJ SOLN
4.0000 mg | Freq: Four times a day (QID) | INTRAMUSCULAR | Status: DC | PRN
  Administered 2017-02-07 – 2017-02-10 (×4): 4 mg via INTRAVENOUS
  Filled 2017-02-07 (×4): qty 2

## 2017-02-07 MED ORDER — DOCUSATE SODIUM 100 MG PO CAPS: 100.0000 mg | ORAL_CAPSULE | Freq: Two times a day (BID) | ORAL | Status: DC | PRN

## 2017-02-07 MED ORDER — SENNA 8.6 MG PO TABS
1.0000 | ORAL_TABLET | Freq: Every day | ORAL | Status: DC
  Administered 2017-02-07 – 2017-02-20 (×12): 8.6 mg via ORAL
  Filled 2017-02-07 (×14): qty 1

## 2017-02-07 MED ORDER — OXYCODONE HCL 5 MG PO TABS: 10.0000 mg | ORAL_TABLET | ORAL | Status: DC | PRN

## 2017-02-07 MED ORDER — FOLIC ACID 1 MG PO TABS
1.0000 mg | ORAL_TABLET | Freq: Every day | ORAL | Status: DC
  Administered 2017-02-07 – 2017-02-21 (×14): 1 mg via ORAL
  Filled 2017-02-07 (×16): qty 1

## 2017-02-07 MED ORDER — POTASSIUM CHLORIDE IN NACL 20-0.9 MEQ/L-% IV SOLN
INTRAVENOUS | Status: DC
  Administered 2017-02-07 – 2017-02-08 (×3): via INTRAVENOUS
  Filled 2017-02-07 (×5): qty 1000

## 2017-02-07 MED ORDER — ACETAMINOPHEN 325 MG PO TABS
650.0000 mg | ORAL_TABLET | Freq: Four times a day (QID) | ORAL | Status: DC | PRN
  Administered 2017-02-13: 650 mg via ORAL

## 2017-02-07 MED ORDER — POTASSIUM CHLORIDE CRYS ER 10 MEQ PO TBCR
10.0000 meq | EXTENDED_RELEASE_TABLET | Freq: Two times a day (BID) | ORAL | Status: AC
  Administered 2017-02-07 – 2017-02-10 (×6): 10 meq via ORAL
  Filled 2017-02-07 (×12): qty 1

## 2017-02-07 MED ORDER — METHIMAZOLE 5 MG PO TABS
5.0000 mg | ORAL_TABLET | Freq: Two times a day (BID) | ORAL | Status: DC
  Administered 2017-02-07 – 2017-02-08 (×3): 5 mg via ORAL
  Filled 2017-02-07 (×4): qty 1

## 2017-02-07 MED ORDER — BENZONATATE 100 MG PO CAPS: 100.0000 mg | ORAL_CAPSULE | Freq: Three times a day (TID) | ORAL | Status: DC | PRN

## 2017-02-07 MED ORDER — ACETAMINOPHEN 650 MG RE SUPP: 650.0000 mg | Freq: Four times a day (QID) | RECTAL | Status: DC | PRN

## 2017-02-07 MED ORDER — LOSARTAN POTASSIUM 50 MG PO TABS
100.0000 mg | ORAL_TABLET | Freq: Every day | ORAL | Status: DC
  Administered 2017-02-07 – 2017-02-16 (×9): 100 mg via ORAL
  Filled 2017-02-07 (×13): qty 2

## 2017-02-07 MED ORDER — ONDANSETRON HCL 4 MG PO TABS
4.0000 mg | ORAL_TABLET | Freq: Four times a day (QID) | ORAL | Status: DC | PRN
  Administered 2017-02-07: 4 mg via ORAL
  Filled 2017-02-07: qty 1

## 2017-02-07 MED ORDER — DEXAMETHASONE 4 MG PO TABS
4.0000 mg | ORAL_TABLET | Freq: Two times a day (BID) | ORAL | Status: DC
Start: 2017-02-07 — End: 2017-02-07

## 2017-02-07 MED ORDER — MORPHINE SULFATE ER 15 MG PO TBCR
15.0000 mg | EXTENDED_RELEASE_TABLET | Freq: Two times a day (BID) | ORAL | Status: DC
  Administered 2017-02-07 – 2017-02-11 (×8): 15 mg via ORAL
  Filled 2017-02-07 (×10): qty 1

## 2017-02-07 MED ORDER — SODIUM CHLORIDE 0.9 % IV SOLN
INTRAVENOUS | Status: DC
  Administered 2017-02-07: 11:00:00 via INTRAVENOUS

## 2017-02-07 NOTE — ED Notes (Addendum)
This nurse tried to adm the dextrose solution however IV appears to be infiltrated. Will look for another IV site.

## 2017-02-07 NOTE — ED Notes (Signed)
Report given to [facility], RN.

## 2017-02-07 NOTE — ED Notes (Signed)
Elenore Rota, RN at bedside at this time to attempt to start another IV.

## 2017-02-07 NOTE — ED Notes (Signed)
Tracey from 1A said that pt will be moved to 1C, will call back after Nursing Supervisor is able to sort out placement.

## 2017-02-07 NOTE — ED Notes (Signed)
Brandy, RN at bedside to attempt to start IV.

## 2017-02-07 NOTE — H&P (Addendum)
[facility] NAME: Audrey Mcgee    MR#:  [id number]  DATE OF BIRTH:  1961/07/04  DATE OF ADMISSION:  02/06/2017  PRIMARY CARE PHYSICIAN: Donnie Coffin, MD   REQUESTING/REFERRING PHYSICIAN:   CHIEF COMPLAINT:   Chief Complaint  Patient presents with  . Weakness    HISTORY OF PRESENT ILLNESS: Audrey Mcgee  is a 56 y.o. female with a known history of Lung cancer, hypertension, thyroid disease, COPD presented to the emergency room with generalized weakness. Patient currently getting chemotherapy and she receives it every 3 weeks. She feels very weak and tired. Does not have any energy. Has some nausea but no vomiting. No fever or chills. Patient was evaluated in the emergency room was given IV fluid bolus for dehydration, patient continued to be weak.she was worked up in the emergency room chest x-ray did not reveal any infection or pneumonia. Has poor oral intake. CT scan of the head was also done in the emergency room which did not show any acute infarct or abnormality. Flu test negative and lactate level normal. Hospitalist service was consulted for failure to thrive and dehydration.  PAST MEDICAL HISTORY:   Past Medical History:  Diagnosis Date  . COPD (chronic obstructive pulmonary disease) ([place])   . Hypertension   . Lung cancer ([place])   . Thyroid disease     PAST SURGICAL HISTORY: Past Surgical History:  Procedure Laterality Date  . CESAREAN SECTION CLASSICAL    . STOMACH SURGERY     Pt reports for a tumor    SOCIAL HISTORY:  Social History  Substance Use Topics  . Smoking status: Current Every Day Smoker    Years: 15.00    Types: Cigarettes  . Smokeless tobacco: Never Used  . Alcohol use 1.2 oz/week    2 Cans of beer per week    FAMILY HISTORY:  Family History  Problem Relation Age of Onset  . Hypertension Father   . Cancer Father   . Diabetes Father   . Cancer Maternal Aunt     DRUG ALLERGIES:   Allergies  Allergen Reactions  . No Known Allergies     REVIEW OF SYSTEMS:   CONSTITUTIONAL: No fever, has weakness.  EYES: No blurred or double vision.  EARS, NOSE, AND THROAT: No tinnitus or ear pain.  RESPIRATORY: No cough, shortness of breath, wheezing or hemoptysis.  CARDIOVASCULAR: No chest pain, orthopnea, edema.  GASTROINTESTINAL: Has nausea, no vomiting, diarrhea or abdominal pain.  GENITOURINARY: No dysuria, hematuria.  ENDOCRINE: No polyuria, nocturia,  HEMATOLOGY: No anemia, easy bruising or bleeding SKIN: No rash or lesion. MUSCULOSKELETAL: No joint pain or arthritis.   NEUROLOGIC: No tingling, numbness, weakness.  PSYCHIATRY: No anxiety or depression.   MEDICATIONS AT HOME:  Prior to Admission medications   Medication Sig Start Date End Date Taking? Authorizing Provider  dexamethasone (DECADRON) 4 MG tablet Take '4mg'$  BID the day before and day after chemotherapy. Patient taking differently: Take 4 mg by mouth 2 (two) times daily. Take '4mg'$  BID the day before and day after chemotherapy. 10/31/16  Yes Lequita Asal, MD  folic acid (FOLVITE) 321 MCG tablet Take 800 mcg by mouth daily.    Yes Historical Provider, MD  losartan (COZAAR) 100 MG tablet Take 1 tablet (100 mg total) by mouth daily. 06/22/16  Yes [name], MD  magnesium oxide (MAG-OX) 400 MG tablet Take 1 tablet (400 mg total) by mouth daily. 04/17/16  Yes  Lequita Asal, MD  megestrol (MEGACE) 40 MG/ML suspension Take 10 mLs (400 mg total) by mouth 2 (two) times daily. 02/02/17  Yes Lequita Asal, MD  methimazole (TAPAZOLE) 5 MG tablet take 1 tablet by mouth twice a day for OVERACTIVE THYROID 08/08/16  Yes Historical Provider, MD  metoprolol (LOPRESSOR) 50 MG tablet take 1 tablet by mouth twice a day NOTE DOSE INCREASE 08/07/16  Yes Historical Provider, MD  morphine (MS CONTIN) 15 MG 12 hr tablet Take 1 tablet (15 mg total) by mouth 2 (two) times daily. 02/02/17  Yes Lequita Asal, MD   omeprazole (PRILOSEC) 20 MG capsule Take 1 capsule (20 mg total) by mouth daily. 06/20/16  Yes Lequita Asal, MD  ondansetron (ZOFRAN) 8 MG tablet Take 1 tablet (8 mg total) by mouth 2 (two) times daily as needed for nausea or vomiting. 08/22/16  Yes Lequita Asal, MD  Oxycodone HCl 10 MG TABS Take 1 tablet (10 mg total) by mouth every 4 (four) hours as needed (pain). 02/02/17  Yes Lequita Asal, MD  potassium chloride (K-DUR) 10 MEQ tablet Take 1 tablet (10 mEq total) by mouth 2 (two) times daily. For 3 days 02/06/17  Yes Lequita Asal, MD  tiotropium (SPIRIVA) 18 MCG inhalation capsule Place 1 capsule (18 mcg total) into inhaler and inhale daily. 06/22/16  Yes [name], MD  docusate sodium (COLACE) 100 MG capsule Take 100 mg by mouth 2 (two) times daily as needed for mild constipation.    Historical Provider, MD  lidocaine (XYLOCAINE) 2 % solution Use as directed 10 mLs in the mouth or throat every 4 (four) hours as needed for mouth pain.    Historical Provider, MD  predniSONE (DELTASONE) 20 MG tablet Take 2 tablets (40 mg total) by mouth daily with breakfast. Patient not taking: Reported on 02/07/2017 11/11/16   Hillary Bow, MD      PHYSICAL EXAMINATION:   VITAL SIGNS: Blood pressure (!) 155/97, pulse 73, temperature 98.5 F (36.9 C), temperature source Axillary, resp. rate 17, height '5\' 2"'$  (1.575 m), weight 49.9 kg (110 lb), last menstrual period 01/30/2008, SpO2 94 %.  GENERAL:  56 y.o.-year-old patient lying in the bed with no acute distress.  EYES: Pupils equal, round, reactive to light and accommodation. No scleral icterus. Extraocular muscles intact.  HEENT: Head atraumatic, normocephalic. Oropharynx dry and nasopharynx clear.  NECK:  Supple, no jugular venous distention. No thyroid enlargement, no tenderness.  LUNGS: Normal breath sounds bilaterally, no wheezing, rales,rhonchi or crepitation. No use of accessory muscles of respiration.  CARDIOVASCULAR: S1,  S2 normal. No murmurs, rubs, or gallops.  ABDOMEN: Soft, nontender, nondistended. Bowel sounds present. No organomegaly or mass.  EXTREMITIES: No pedal edema, cyanosis, or clubbing.  NEUROLOGIC: Cranial nerves II through XII are intact. Muscle strength 5/5 in all extremities. Sensation intact. Gait not checked.  PSYCHIATRIC: The patient is alert and oriented x 3.  SKIN: No obvious rash, lesion, or ulcer.   LABORATORY PANEL:   CBC  Recent Labs Lab 02/06/17 0827 02/06/17 2231  WBC 5.2 4.8  HGB 14.3 11.4*  HCT 40.5 33.2*  PLT 378 251  MCV 97.3 99.0  MCH 34.2* 34.1*  MCHC 35.2 34.5  RDW 14.2 14.3  LYMPHSABS 1.3  --   MONOABS 0.4  --   EOSABS 0.1  --   BASOSABS 0.0  --    ------------------------------------------------------------------------------------------------------------------  Chemistries   Recent Labs Lab 02/06/17 0827 02/06/17 2231  NA 136 140  K 3.0* 3.1*  CL 102 115*  CO2 22 17*  GLUCOSE 88 66  BUN 9 7  CREATININE 1.07* 0.85  CALCIUM 9.4 7.5*  MG 1.8  --   AST 20 20  ALT 8* 8*  ALKPHOS 54 40  BILITOT 1.2 0.9   ------------------------------------------------------------------------------------------------------------------ estimated creatinine clearance is 60.3 mL/min (by C-G formula based on SCr of 0.85 mg/dL). ------------------------------------------------------------------------------------------------------------------ No results for input(s): TSH, T4TOTAL, T3FREE, THYROIDAB in the last 72 hours.  Invalid input(s): FREET3   Coagulation profile No results for input(s): INR, PROTIME in the last 168 hours. ------------------------------------------------------------------------------------------------------------------- No results for input(s): DDIMER in the last 72 hours. -------------------------------------------------------------------------------------------------------------------  Cardiac Enzymes No results for input(s): CKMB,  TROPONINI, MYOGLOBIN in the last 168 hours.  Invalid input(s): CK ------------------------------------------------------------------------------------------------------------------ Invalid input(s): POCBNP  ---------------------------------------------------------------------------------------------------------------  Urinalysis    Component Value Date/Time   COLORURINE YELLOW (A) 02/07/2017 0139   APPEARANCEUR CLEAR (A) 02/07/2017 0139   APPEARANCEUR Clear 11/10/2013 0918   LABSPEC 1.027 02/07/2017 0139   LABSPEC 1.018 11/10/2013 0918   PHURINE 5.0 02/07/2017 0139   GLUCOSEU 50 (A) 02/07/2017 0139   GLUCOSEU Negative 11/10/2013 0918   HGBUR NEGATIVE 02/07/2017 0139   BILIRUBINUR NEGATIVE 02/07/2017 0139   BILIRUBINUR Negative 11/10/2013 0918   KETONESUR 5 (A) 02/07/2017 0139   PROTEINUR NEGATIVE 02/07/2017 0139   NITRITE NEGATIVE 02/07/2017 0139   LEUKOCYTESUR NEGATIVE 02/07/2017 0139   LEUKOCYTESUR Negative 11/10/2013 0918     RADIOLOGY: Ct Head W Or Wo Contrast  Result Date: 02/06/2017 CLINICAL DATA:  Initial evaluation for acute altered mental status. History of lung cancer. EXAM: CT HEAD WITHOUT AND WITH CONTRAST TECHNIQUE: Contiguous axial images were obtained from the base of the skull through the vertex without and with intravenous contrast CONTRAST:  62m ISOVUE-300 IOPAMIDOL (ISOVUE-300) INJECTION 61% COMPARISON:  None. FINDINGS: Brain: Generalized cerebral atrophy with mild chronic small vessel ischemic disease. No acute intracranial hemorrhage. No evidence for acute large vessel territory infarct. No mass lesion, midline shift or mass effect. No hydrocephalus. No extra-axial fluid collection. No abnormal enhancement. Vascular: No hyperdense vessel. Normal intravascular enhancement seen throughout the intracranial vasculature. Scattered vascular calcifications noted within the carotid siphons. Skull: Scalp soft tissues demonstrate no acute abnormality. Calvarium intact.  Sinuses/Orbits: Globes and orbital soft tissues within normal limits. Paranasal sinuses and mastoid air cells are clear. IMPRESSION: 1. No acute intracranial process identified. No evidence for intracranial metastatic disease. 2. Generalized age-related cerebral atrophy with mild chronic small vessel ischemic disease. Electronically Signed   By: BJeannine BogaM.D.   On: 02/06/2017 23:47   Dg Chest Portable 1 View  Result Date: 02/06/2017 CLINICAL DATA:  Acute onset of nausea and vomiting. Recent chemotherapy. Generalized weakness and lightheadedness. Initial encounter. EXAM: PORTABLE CHEST 1 VIEW COMPARISON:  Chest radiograph performed 11/09/2016, and PET/CT performed 01/29/2017 FINDINGS: The lungs are well-aerated and clear. There is mild elevation of the left hemidiaphragm. There is no evidence of focal opacification, pleural effusion or pneumothorax. Known left-sided pulmonary nodules are not well characterized on radiograph. The cardiomediastinal silhouette is within normal limits. No acute osseous abnormalities are seen. IMPRESSION: Mild elevation of the left hemidiaphragm. Lungs remain grossly clear. Known left-sided pulmonary nodules are not well characterized on radiograph. Electronically Signed   By: JGarald BaldingM.D.   On: 02/06/2017 23:29    EKG: Orders placed or performed during the hospital encounter of 02/06/17  . ED EKG  . ED EKG    IMPRESSION AND PLAN: 56year old female patient with  history of lung cancer, hypertension, COPD presented to the emergency room with generalized weakness. Admitting diagnosis 1. Dehydration 2. Adult failure to thrive 3. Hypokalemia 4. Lung cancer 5. COPD Treatment plan Admit patient to medical floor observation bed IV fluid hydration Appetite stimulants Nutritional supplements Replace potassium DVT prophylaxis subcutaneous Lovenox 40 MG daily Supportive care. All the records are reviewed and case discussed with ED provider. Management  plans discussed with the patient, family and they are in agreement.  CODE STATUS:FULL CODE Code Status History    Date Active Date Inactive Code Status Order ID Comments User Context   11/07/2016  6:18 AM 11/11/2016  2:39 PM Full Code [id number]  Harrie Foreman, MD Inpatient   06/21/2016 11:52 AM 06/22/2016  3:07 PM Full Code [id number]  [name], MD Inpatient       TOTAL TIME TAKING CARE OF THIS PATIENT: 50 minutes.    Saundra Shelling M.D on 02/07/2017 at 4:46 AM  Between 7am to 6pm - Pager - [phone]  After 6pm go to [email or web address] - password [email or web address] [facility]  Office  [phone]  CC: Primary care physician; Donnie Coffin, MD

## 2017-02-07 NOTE — Progress Notes (Signed)
[facility] [place] NAME: Audrey Mcgee    MR#:  [id number]  DATE OF BIRTH:  1961/09/28  SUBJECTIVE:  CHIEF COMPLAINT:   Chief Complaint  Patient presents with  . Weakness   The patient is 56 year old African-American female with past medical history significant for history of lung cancer, hypertension, thyroid disease, COPD, who presents to the hospital with complaints of generalized weakness, no energy, nausea, no vomiting, poor oral intake. In emergency room, she was felt to be dehydrated and was admitted. Tests were negative for flu and lactate was normal. Patient denies any abdominal discomfort, admits of some constipation of 3 days. She had to use enema about 3 days ago to produce bowel movements, the patient is on opiates chronically at home. KUB in the hospital showed no significant abnormalities, including no constipation, ileus Review of Systems  Unable to perform ROS: Mental acuity    VITAL SIGNS: Blood pressure 126/86, pulse 85, temperature 97.8 F (36.6 C), temperature source Oral, resp. rate 18, height '5\' 4"'$  (1.626 m), weight 60.6 kg (133 lb 8 oz), last menstrual period 01/30/2008, SpO2 100 %.  PHYSICAL EXAMINATION:   GENERAL:  56 y.o.-year-old patient lying in the bed with no acute distress. Slow to respond, however, answer his questions appropriately EYES: Pupils equal, round, reactive to light and accommodation. No scleral icterus. Extraocular muscles intact.  HEENT: Head atraumatic, normocephalic. Oropharynx and nasopharynx clear.  NECK:  Supple, no jugular venous distention. No thyroid enlargement, no tenderness.  LUNGS: Normal breath sounds bilaterally, no wheezing, rales,rhonchi or crepitation. No use of accessory muscles of respiration.  CARDIOVASCULAR: S1, S2 normal. No murmurs, rubs, or gallops.  ABDOMEN: Soft, nontender, nondistended. Bowel sounds present. No organomegaly or mass.  EXTREMITIES: No pedal  edema, cyanosis, or clubbing.  NEUROLOGIC: Cranial nerves II through XII are intact. Muscle strength 5/5 in all extremities. Sensation intact. Gait not checked.  PSYCHIATRIC: The patient is alert and oriented x 3. Masslike face, very slow to respond to questions, it takes her at least 2 minutes to ask me something, then she forgets what she wanted to ask SKIN: No obvious rash, lesion, or ulcer.   ORDERS/RESULTS REVIEWED:   CBC  Recent Labs Lab 02/06/17 0827 02/06/17 2231 02/07/17 1107  WBC 5.2 4.8 5.8  HGB 14.3 11.4* 12.8  HCT 40.5 33.2* 38.7  PLT 378 251 306  MCV 97.3 99.0 99.5  MCH 34.2* 34.1* 33.0  MCHC 35.2 34.5 33.2  RDW 14.2 14.3 14.0  LYMPHSABS 1.3  --   --   MONOABS 0.4  --   --   EOSABS 0.1  --   --   BASOSABS 0.0  --   --    ------------------------------------------------------------------------------------------------------------------  Chemistries   Recent Labs Lab 02/06/17 0827 02/06/17 2231 02/07/17 1107  NA 136 140 138  K 3.0* 3.1* 3.1*  CL 102 115* 107  CO2 22 17* 21*  GLUCOSE 88 66 68  BUN 9 7 <5*  CREATININE 1.07* 0.85 0.98  CALCIUM 9.4 7.5* 8.4*  MG 1.8  --   --   AST 20 20  --   ALT 8* 8*  --   ALKPHOS 54 40  --   BILITOT 1.2 0.9  --    ------------------------------------------------------------------------------------------------------------------ estimated creatinine clearance is 57.3 mL/min (by C-G formula based on SCr of 0.98 mg/dL). ------------------------------------------------------------------------------------------------------------------ No results for input(s): TSH, T4TOTAL, T3FREE, THYROIDAB in the last 72 hours.  Invalid input(s): FREET3  Cardiac Enzymes  No results for input(s): CKMB, TROPONINI, MYOGLOBIN in the last 168 hours.  Invalid input(s): CK ------------------------------------------------------------------------------------------------------------------ Invalid input(s):  POCBNP ---------------------------------------------------------------------------------------------------------------  RADIOLOGY: Dg Abd 1 View  Result Date: 02/07/2017 CLINICAL DATA:  Nausea and weakness. History of metastatic lung carcinoma EXAM: ABDOMEN - 1 VIEW COMPARISON:  None. FINDINGS: Contrast is noted in urinary bladder. No urinary bladder lesions are evident. There is no bowel dilatation or air-fluid level suggesting bowel obstruction. No free air. There are multiple phleboliths in the pelvis. IMPRESSION: Contrast in urinary bladder. No urinary bladder lesion evident. No bowel dilatation or air-fluid level suggesting bowel obstruction. No free air. Electronically Signed   By: Lowella Grip III M.D.   On: 02/07/2017 13:07   Ct Head W Or Wo Contrast  Result Date: 02/06/2017 CLINICAL DATA:  Initial evaluation for acute altered mental status. History of lung cancer. EXAM: CT HEAD WITHOUT AND WITH CONTRAST TECHNIQUE: Contiguous axial images were obtained from the base of the skull through the vertex without and with intravenous contrast CONTRAST:  2m ISOVUE-300 IOPAMIDOL (ISOVUE-300) INJECTION 61% COMPARISON:  None. FINDINGS: Brain: Generalized cerebral atrophy with mild chronic small vessel ischemic disease. No acute intracranial hemorrhage. No evidence for acute large vessel territory infarct. No mass lesion, midline shift or mass effect. No hydrocephalus. No extra-axial fluid collection. No abnormal enhancement. Vascular: No hyperdense vessel. Normal intravascular enhancement seen throughout the intracranial vasculature. Scattered vascular calcifications noted within the carotid siphons. Skull: Scalp soft tissues demonstrate no acute abnormality. Calvarium intact. Sinuses/Orbits: Globes and orbital soft tissues within normal limits. Paranasal sinuses and mastoid air cells are clear. IMPRESSION: 1. No acute intracranial process identified. No evidence for intracranial metastatic disease. 2.  Generalized age-related cerebral atrophy with mild chronic small vessel ischemic disease. Electronically Signed   By: BJeannine BogaM.D.   On: 02/06/2017 23:47   Dg Chest Portable 1 View  Result Date: 02/06/2017 CLINICAL DATA:  Acute onset of nausea and vomiting. Recent chemotherapy. Generalized weakness and lightheadedness. Initial encounter. EXAM: PORTABLE CHEST 1 VIEW COMPARISON:  Chest radiograph performed 11/09/2016, and PET/CT performed 01/29/2017 FINDINGS: The lungs are well-aerated and clear. There is mild elevation of the left hemidiaphragm. There is no evidence of focal opacification, pleural effusion or pneumothorax. Known left-sided pulmonary nodules are not well characterized on radiograph. The cardiomediastinal silhouette is within normal limits. No acute osseous abnormalities are seen. IMPRESSION: Mild elevation of the left hemidiaphragm. Lungs remain grossly clear. Known left-sided pulmonary nodules are not well characterized on radiograph. Electronically Signed   By: JGarald BaldingM.D.   On: 02/06/2017 23:29    EKG:  Orders placed or performed during the hospital encounter of 02/06/17  . ED EKG  . ED EKG    ASSESSMENT AND PLAN:  Active Problems:   Dehydration  #1. Altered mental state of unclear etiology, get urine drug screen, supportive therapy, IV fluids #2. Intermittent nausea and vomiting, get urine drug screen, initiate patient on Protonix, Zofran, IV fluids, follow closely clinically, KUB was negative for constipation #3. Failure to thrive adult, continue patient on Megace, Protonix, get oncologist to see patient in consultation #4. Hypokalemia, supplementing orally, check magnesium level  Management plans discussed with the patient, family and they are in agreement.   DRUG ALLERGIES:  Allergies  Allergen Reactions  . No Known Allergies     CODE STATUS:     Code Status Orders        Start     Ordered   02/07/17 1026  Full code  Continuous      02/07/17 1026    Code Status History    Date Active Date Inactive Code Status Order ID Comments User Context   11/07/2016  6:18 AM 11/11/2016  2:39 PM Full Code [id number]  Harrie Foreman, MD Inpatient   06/21/2016 11:52 AM 06/22/2016  3:07 PM Full Code [id number]  [name], MD Inpatient      TOTAL TIME TAKING CARE OF THIS PATIENT: 40 minutes.    [name] M.D on 02/07/2017 at 3:17 PM  Between 7am to 6pm - Pager - [phone]  After 6pm go to [email or web address] - password [email or web address] [facility]  Office  [phone]  CC: Primary care physician; Donnie Coffin, MD

## 2017-02-07 NOTE — ED Notes (Signed)
This RN to bedside at this time. Pt noted to be resting in bed, laying on R side. Pt inquiring as to status of room, this RN informed patient that the time she would be able to go to her room was unknown due to change in bed assignment and nursing supervisor being tied up in an emergency, pt and family member at bedside at this time states understanding. Will continue to monitor for further patient needs.

## 2017-02-07 NOTE — ED Notes (Signed)
Elenore Rota, RN to bedside with this RN. Pt c/o burning in her IV site when attempting to push meds through the IV. This RN attempted to flush, Elenore Rota, RN attempted to flush, while attempting to start flushing IV, pt c/o burning and states "please don't, please don't."

## 2017-02-07 NOTE — ED Notes (Signed)
This RN attempted x 2 to start IV. 2nd attempt to start IV successful.

## 2017-02-07 NOTE — ED Notes (Signed)
Report to Megan, RN.

## 2017-02-08 ENCOUNTER — Observation Stay: Payer: Medicaid Other

## 2017-02-08 DIAGNOSIS — F329 Major depressive disorder, single episode, unspecified: Secondary | ICD-10-CM | POA: Diagnosis not present

## 2017-02-08 DIAGNOSIS — R634 Abnormal weight loss: Secondary | ICD-10-CM

## 2017-02-08 DIAGNOSIS — R531 Weakness: Secondary | ICD-10-CM | POA: Diagnosis not present

## 2017-02-08 DIAGNOSIS — R410 Disorientation, unspecified: Secondary | ICD-10-CM | POA: Diagnosis not present

## 2017-02-08 DIAGNOSIS — F32A Depression, unspecified: Secondary | ICD-10-CM

## 2017-02-08 DIAGNOSIS — Z809 Family history of malignant neoplasm, unspecified: Secondary | ICD-10-CM

## 2017-02-08 DIAGNOSIS — Z79899 Other long term (current) drug therapy: Secondary | ICD-10-CM

## 2017-02-08 DIAGNOSIS — J449 Chronic obstructive pulmonary disease, unspecified: Secondary | ICD-10-CM

## 2017-02-08 DIAGNOSIS — E860 Dehydration: Secondary | ICD-10-CM | POA: Diagnosis not present

## 2017-02-08 DIAGNOSIS — R5383 Other fatigue: Secondary | ICD-10-CM | POA: Diagnosis not present

## 2017-02-08 DIAGNOSIS — C3490 Malignant neoplasm of unspecified part of unspecified bronchus or lung: Secondary | ICD-10-CM

## 2017-02-08 DIAGNOSIS — R630 Anorexia: Secondary | ICD-10-CM

## 2017-02-08 DIAGNOSIS — C799 Secondary malignant neoplasm of unspecified site: Secondary | ICD-10-CM

## 2017-02-08 DIAGNOSIS — E079 Disorder of thyroid, unspecified: Secondary | ICD-10-CM | POA: Diagnosis not present

## 2017-02-08 DIAGNOSIS — R220 Localized swelling, mass and lump, head: Secondary | ICD-10-CM

## 2017-02-08 DIAGNOSIS — R627 Adult failure to thrive: Secondary | ICD-10-CM

## 2017-02-08 DIAGNOSIS — I10 Essential (primary) hypertension: Secondary | ICD-10-CM

## 2017-02-08 DIAGNOSIS — R110 Nausea: Secondary | ICD-10-CM | POA: Diagnosis not present

## 2017-02-08 DIAGNOSIS — F17210 Nicotine dependence, cigarettes, uncomplicated: Secondary | ICD-10-CM

## 2017-02-08 LAB — BASIC METABOLIC PANEL
Anion gap: 9 (ref 5–15)
BUN: <5 mg/dL — ABNORMAL LOW (ref 6–20)
CALCIUM: 8.2 mg/dL — AB (ref 8.9–10.3)
CHLORIDE: 109 mmol/L (ref 101–111)
CO2: 20 mmol/L — AB (ref 22–32)
CREATININE: 0.9 mg/dL (ref 0.44–1.00)
GFR calc Af Amer: >60 mL/min (ref >60)
GFR calc non Af Amer: >60 mL/min (ref >60)
GLUCOSE: 89 mg/dL (ref 65–99)
Potassium: 3.4 mmol/L — ABNORMAL LOW (ref 3.5–5.1)
Sodium: 138 mmol/L (ref 135–145)

## 2017-02-08 LAB — TSH: TSH: 77 u[IU]/mL — ABNORMAL HIGH (ref 0.350–4.500)

## 2017-02-08 LAB — T4, FREE

## 2017-02-08 MED ORDER — LEVOTHYROXINE SODIUM 50 MCG PO TABS: 50.0000 ug | ORAL_TABLET | Freq: Every day | ORAL | Status: DC

## 2017-02-08 MED ORDER — MAGNESIUM SULFATE 4 GM/100ML IV SOLN
4.0000 g | Freq: Once | INTRAVENOUS | Status: AC
  Administered 2017-02-08: 09:00:00 4 g via INTRAVENOUS
  Filled 2017-02-08: qty 100

## 2017-02-08 MED ORDER — ORAL CARE MOUTH RINSE
15.0000 mL | Freq: Two times a day (BID) | OROMUCOSAL | Status: DC
  Administered 2017-02-08 – 2017-02-21 (×17): 15 mL via OROMUCOSAL

## 2017-02-08 MED ORDER — GADOBENATE DIMEGLUMINE 529 MG/ML IV SOLN
10.0000 mL | Freq: Once | INTRAVENOUS | Status: AC | PRN
  Administered 2017-02-10: 12:00:00 10 mL via INTRAVENOUS

## 2017-02-08 MED ORDER — NYSTATIN 100000 UNIT/ML MT SUSP
5.0000 mL | Freq: Four times a day (QID) | OROMUCOSAL | Status: DC
  Administered 2017-02-08 – 2017-02-21 (×31): 500000 [IU] via ORAL
  Filled 2017-02-08 (×40): qty 5

## 2017-02-08 MED ORDER — POTASSIUM CHLORIDE CRYS ER 20 MEQ PO TBCR
40.0000 meq | EXTENDED_RELEASE_TABLET | Freq: Once | ORAL | Status: AC
  Administered 2017-02-08: 40 meq via ORAL
  Filled 2017-02-08: qty 2

## 2017-02-08 NOTE — Consult Note (Signed)
Consultation  Referring Provider:      Primary Care Physician:  Donnie Coffin, MD Primary Gastroenterologist:    San Jetty MD     Reason for Consultation:   Anorexia, nausea and dysphagia      Impression / Plan:   Anorexia/Nausea: Failure to thrive with history of metastatic non small lung cancer and abnormal recent PET scan. Some complaints of dysphagia at sternal notch.  Obtain UGI series and swallowing study by speech pathology.  If negative and does not improve with po intake while in hospital consider EGD. Briefly discussed PEG tube placement but patient is against this at this time.           HPI:   Audrey Mcgee is a 56 y.o. female with metastatic non small cell lung cancer and abnormal PET scan 01-23-17 suggestive of advanced cancer in neck, chest and upper abdomen who admitted for failure to thrive. Dehydrated and poor appetite. Reports some difficulty swallowing some solids at level of sternal notch.  Also complains of nausea. Reports of altered mental status.  Past Medical History:  Diagnosis Date  . COPD (chronic obstructive pulmonary disease) ([place])   . Hypertension   . Lung cancer ([place])   . Thyroid disease     Past Surgical History:  Procedure Laterality Date  . CESAREAN SECTION CLASSICAL    . STOMACH SURGERY     Pt reports for a tumor    Family History  Problem Relation Age of Onset  . Hypertension Father   . Cancer Father   . Diabetes Father   . Cancer Maternal Aunt         Social History  Substance Use Topics  . Smoking status: Current Every Day Smoker    Years: 15.00    Types: Cigarettes  . Smokeless tobacco: Never Used  . Alcohol use 1.2 oz/week    2 Cans of beer per week    Prior to Admission medications   Medication Sig Start Date End Date Taking? Authorizing Provider  dexamethasone (DECADRON) 4 MG tablet Take '4mg'$  BID the day before and day after chemotherapy. Patient taking differently: Take 4 mg by mouth 2 (two) times daily.  Take '4mg'$  BID the day before and day after chemotherapy. 10/31/16  Yes Lequita Asal, MD  folic acid (FOLVITE) 607 MCG tablet Take 800 mcg by mouth daily.    Yes Historical Provider, MD  losartan (COZAAR) 100 MG tablet Take 1 tablet (100 mg total) by mouth daily. 06/22/16  Yes [name], MD  magnesium oxide (MAG-OX) 400 MG tablet Take 1 tablet (400 mg total) by mouth daily. 04/17/16  Yes Lequita Asal, MD  megestrol (MEGACE) 40 MG/ML suspension Take 10 mLs (400 mg total) by mouth 2 (two) times daily. 02/02/17  Yes Lequita Asal, MD  methimazole (TAPAZOLE) 5 MG tablet take 1 tablet by mouth twice a day for OVERACTIVE THYROID 08/08/16  Yes Historical Provider, MD  metoprolol (LOPRESSOR) 50 MG tablet take 1 tablet by mouth twice a day NOTE DOSE INCREASE 08/07/16  Yes Historical Provider, MD  morphine (MS CONTIN) 15 MG 12 hr tablet Take 1 tablet (15 mg total) by mouth 2 (two) times daily. 02/02/17  Yes Lequita Asal, MD  omeprazole (PRILOSEC) 20 MG capsule Take 1 capsule (20 mg total) by mouth daily. 06/20/16  Yes Lequita Asal, MD  ondansetron (ZOFRAN) 8 MG tablet Take 1 tablet (8 mg total) by mouth 2 (two) times daily as needed for nausea  or vomiting. 08/22/16  Yes Lequita Asal, MD  Oxycodone HCl 10 MG TABS Take 1 tablet (10 mg total) by mouth every 4 (four) hours as needed (pain). 02/02/17  Yes Lequita Asal, MD  potassium chloride (K-DUR) 10 MEQ tablet Take 1 tablet (10 mEq total) by mouth 2 (two) times daily. For 3 days 02/06/17  Yes Lequita Asal, MD  tiotropium (SPIRIVA) 18 MCG inhalation capsule Place 1 capsule (18 mcg total) into inhaler and inhale daily. 06/22/16  Yes [name], MD  docusate sodium (COLACE) 100 MG capsule Take 100 mg by mouth 2 (two) times daily as needed for mild constipation.    Historical Provider, MD  lidocaine (XYLOCAINE) 2 % solution Use as directed 10 mLs in the mouth or throat every 4 (four) hours as needed for mouth pain.     Historical Provider, MD  predniSONE (DELTASONE) 20 MG tablet Take 2 tablets (40 mg total) by mouth daily with breakfast. Patient not taking: Reported on 02/07/2017 11/11/16   Hillary Bow, MD    Current Facility-Administered Medications  Medication Dose Route Frequency Provider Last Rate Last Dose  . 0.9 % NaCl with KCl 20 mEq/ L  infusion   Intravenous Continuous [name], MD 75 mL/hr at 02/08/17 0328    . acetaminophen (TYLENOL) tablet 650 mg  650 mg Oral Q6H PRN Saundra Shelling, MD       Or  . acetaminophen (TYLENOL) suppository 650 mg  650 mg Rectal Q6H PRN Saundra Shelling, MD      . benzonatate (TESSALON) capsule 100 mg  100 mg Oral TID PRN Saundra Shelling, MD      . docusate sodium (COLACE) capsule 100 mg  100 mg Oral BID [name], MD   100 mg at 02/08/17 0859  . enoxaparin (LOVENOX) injection 40 mg  40 mg Subcutaneous Q24H Pavan Pyreddy, MD   40 mg at 78/58/85 0277  . folic acid (FOLVITE) tablet 1 mg  1 mg Oral Daily Loree Fee, RPH   1 mg at 02/08/17 1040  . lidocaine (XYLOCAINE) 2 % viscous mouth solution 10 mL  10 mL Mouth/Throat Q4H PRN Pavan Pyreddy, MD      . losartan (COZAAR) tablet 100 mg  100 mg Oral Daily Pavan Pyreddy, MD   100 mg at 02/08/17 1040  . magnesium oxide (MAG-OX) tablet 400 mg  400 mg Oral Daily Saundra Shelling, MD   400 mg at 02/08/17 1040  . megestrol (MEGACE) 40 MG/ML suspension 400 mg  400 mg Oral BID Saundra Shelling, MD   400 mg at 02/08/17 0900  . methimazole (TAPAZOLE) tablet 5 mg  5 mg Oral BID Saundra Shelling, MD   5 mg at 02/08/17 1040  . morphine (MS CONTIN) 12 hr tablet 15 mg  15 mg Oral BID Saundra Shelling, MD   15 mg at 02/08/17 1040  . ondansetron (ZOFRAN) tablet 4 mg  4 mg Oral Q6H PRN Saundra Shelling, MD   4 mg at 02/07/17 0920   Or  . ondansetron (ZOFRAN) injection 4 mg  4 mg Intravenous Q6H PRN Saundra Shelling, MD   4 mg at 02/08/17 1046  . oxyCODONE (Oxy IR/ROXICODONE) immediate release tablet 10 mg  10 mg Oral Q4H PRN Pavan Pyreddy, MD      .  pantoprazole (PROTONIX) EC tablet 40 mg  40 mg Oral Daily Saundra Shelling, MD   40 mg at 02/08/17 0859  . potassium chloride (K-DUR) CR tablet 10 mEq  10 mEq  Oral BID Saundra Shelling, MD   10 mEq at 02/08/17 0905  . senna (SENOKOT) tablet 8.6 mg  1 tablet Oral Daily [name], MD   8.6 mg at 02/08/17 1040  . tiotropium (SPIRIVA) inhalation capsule 18 mcg  18 mcg Inhalation Daily Saundra Shelling, MD   18 mcg at 02/08/17 0920    Allergies as of 02/06/2017 - Review Complete 02/06/2017  Allergen Reaction Noted  . No known allergies  03/26/2015     Review of Systems:    This is positive for those things mentioned in the HPI,         Physical Exam:  Vital signs in last 24 hours: Temp:  [97.8 F (36.6 C)-97.9 F (36.6 C)] 97.9 F (36.6 C) (02/18 0336) Pulse Rate:  [75-85] 77 (02/18 0900) Resp:  [18-20] 18 (02/18 0900) BP: (126-169)/(86-100) 169/100 (02/18 0900) SpO2:  [97 %-100 %] 97 % (02/18 0900) Last BM Date: 02/06/17 (per patient )  General:  Well-developed, well-nourished and in no acute distress Eyes:  anicteric. ENT:   Mouth and posterior pharynx free of lesions.  Neck:   supple w/o thyromegaly or mass.  Lungs: Clear to auscultation bilaterally. Heart:  S1S2, no rubs, murmurs, gallops. Abdomen:  soft, non-tender, no hepatosplenomegaly, hernia, or mass and BS+.  Rectal: Lymph:  no cervical or supraclavicular adenopathy. Extremities:   no edema Skin   no rash. Neuro:  A&O x 3.  Psych:  appropriate mood and  Affect.   Data Reviewed:   LAB RESULTS:  Recent Labs  02/06/17 0827 02/06/17 2231 02/07/17 1107  WBC 5.2 4.8 5.8  HGB 14.3 11.4* 12.8  HCT 40.5 33.2* 38.7  PLT 378 251 306   BMET  Recent Labs  02/06/17 2231 02/07/17 1107 02/08/17 0425  NA 140 138 138  K 3.1* 3.1* 3.4*  CL 115* 107 109  CO2 17* 21* 20*  GLUCOSE 66 68 89  BUN 7 <5* <5*  CREATININE 0.85 0.98 0.90  CALCIUM 7.5* 8.4* 8.2*   LFT  Recent Labs  02/06/17 2231  PROT 6.5  ALBUMIN  3.1*  AST 20  ALT 8*  ALKPHOS 40  BILITOT 0.9  BILIDIR 0.2  IBILI 0.7   PT/INR No results for input(s): LABPROT, INR in the last 72 hours.  STUDIES: Dg Abd 1 View  Result Date: 02/07/2017 CLINICAL DATA:  Nausea and weakness. History of metastatic lung carcinoma EXAM: ABDOMEN - 1 VIEW COMPARISON:  None. FINDINGS: Contrast is noted in urinary bladder. No urinary bladder lesions are evident. There is no bowel dilatation or air-fluid level suggesting bowel obstruction. No free air. There are multiple phleboliths in the pelvis. IMPRESSION: Contrast in urinary bladder. No urinary bladder lesion evident. No bowel dilatation or air-fluid level suggesting bowel obstruction. No free air. Electronically Signed   By: Lowella Grip III M.D.   On: 02/07/2017 13:07   Ct Head W Or Wo Contrast  Result Date: 02/06/2017 CLINICAL DATA:  Initial evaluation for acute altered mental status. History of lung cancer. EXAM: CT HEAD WITHOUT AND WITH CONTRAST TECHNIQUE: Contiguous axial images were obtained from the base of the skull through the vertex without and with intravenous contrast CONTRAST:  18m ISOVUE-300 IOPAMIDOL (ISOVUE-300) INJECTION 61% COMPARISON:  None. FINDINGS: Brain: Generalized cerebral atrophy with mild chronic small vessel ischemic disease. No acute intracranial hemorrhage. No evidence for acute large vessel territory infarct. No mass lesion, midline shift or mass effect. No hydrocephalus. No extra-axial fluid collection. No abnormal enhancement. Vascular: No hyperdense  vessel. Normal intravascular enhancement seen throughout the intracranial vasculature. Scattered vascular calcifications noted within the carotid siphons. Skull: Scalp soft tissues demonstrate no acute abnormality. Calvarium intact. Sinuses/Orbits: Globes and orbital soft tissues within normal limits. Paranasal sinuses and mastoid air cells are clear. IMPRESSION: 1. No acute intracranial process identified. No evidence for  intracranial metastatic disease. 2. Generalized age-related cerebral atrophy with mild chronic small vessel ischemic disease. Electronically Signed   By: Jeannine Boga M.D.   On: 02/06/2017 23:47   Dg Chest Portable 1 View  Result Date: 02/06/2017 CLINICAL DATA:  Acute onset of nausea and vomiting. Recent chemotherapy. Generalized weakness and lightheadedness. Initial encounter. EXAM: PORTABLE CHEST 1 VIEW COMPARISON:  Chest radiograph performed 11/09/2016, and PET/CT performed 01/29/2017 FINDINGS: The lungs are well-aerated and clear. There is mild elevation of the left hemidiaphragm. There is no evidence of focal opacification, pleural effusion or pneumothorax. Known left-sided pulmonary nodules are not well characterized on radiograph. The cardiomediastinal silhouette is within normal limits. No acute osseous abnormalities are seen. IMPRESSION: Mild elevation of the left hemidiaphragm. Lungs remain grossly clear. Known left-sided pulmonary nodules are not well characterized on radiograph. Electronically Signed   By: Garald Balding M.D.   On: 02/06/2017 23:29     PREVIOUS ENDOSCOPIES:                Thanks   LOS: 0 days   San Jetty MD @  02/08/2017, 10:58 AM

## 2017-02-08 NOTE — Progress Notes (Signed)
Pt refuses to have MRI. Pt stated that she feels anxious and need some time to calm down. Pt decline medications for anxiety and denies claustrophobia.

## 2017-02-08 NOTE — Consult Note (Signed)
[facility] Consult   Reason for Consult:  Confusion, slowness Referring Physician:  Dr. Ether Griffins Patient Identification: Audrey Mcgee MRN:  [id number] Principal Diagnosis: <principal problem not specified> Diagnosis:  Depressive disorder, unspecified;  Hypothyroidism, r/o delirium Patient Active Problem List   Diagnosis Date Noted  . Dehydration [E86.0] 02/07/2017  . Weight loss [R63.4] 02/07/2017  . Encounter for antineoplastic chemotherapy [Z51.11] 02/01/2017  . 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 Sepsis ([place]) [A41.9] 11/07/2016  . Protein-calorie malnutrition, severe [E43] 11/07/2016  . Acute respiratory distress [R06.03] 06/21/2016  . COPD exacerbation ([place]) [J44.1] 06/21/2016  . Sinus tachycardia [R00.0] 06/21/2016  . Recurrent aspiration bronchitis/pneumonia ([place]) [J69.0] 06/21/2016  . Dysphagia [R13.10] 06/21/2016  . Nausea and vomiting [R11.2] 06/21/2016  . Dental abscess [K04.7] 05/10/2016  . Swallowing difficulty [R13.10] 03/28/2016  . Hypomagnesemia [E83.42] 02/29/2016  . Hoarseness [R49.0] 02/18/2016  . Cancer related pain [G89.3] 07/08/2015  . Adenocarcinoma, lung ([place]) [C34.90] 03/26/2015  . 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 Ear ache [H92.09] 10/03/2014  . Abscess of buccal cavity [K12.2] 10/03/2014  . Lump in neck [R22.1] 10/03/2014  . Laryngeal pain [R07.0] 10/03/2014    Total Time spent with patient: 1 hour  Subjective:   Audrey Mcgee is a 56 y.o. female patient admitted with dehydration, failure to thrive. " weak and tired, cannot sleep"  HPI:   [name]  is a 56 y.o. female with a known history of lung cancer with metastasis,  thyroid disease, and multiple medical problems  presented to the emergency room with generalized weakness,admitted for dehydra tion, failure to thrive. Psych consult called for confusion/slowness.  Patient currently getting chemotherapy  and she receives it every 3 weeks. Pt presents as thin, lying in bed, speaks very soft and slowly with latency. States she has been suffering from cancer for a while and feels tired and weak. Endorses anxiety,sadness and hopelessness, poor sleep for several weeks, but denies suicidal ideation/intent/plan. Does not have any energy Denies AVH. Denies Hi. Her boyfriend who was visiting the pt , corroborated her hx. Admits drinking 1-2 beers 3-4 x a week for last few yrs, last drink 1 week ago, denies that it is  A problem for her, denies any withdrawal symptom.   TSH-77, has h/o hyperthyroidism with tx.  CT scan of the head was also done in the emergency room which did not show any acute infarct or abnormality, no metastasis. MRI brain- pending.   Past Psychiatric History: has h/o depression few yrs ago when she was first diagnosed with cancer, treated with antidepressant, could not recall the name. Denies hospitalization, denies suicide attempt in past, denies HI/aggression in the past.   Risk to Self: Is patient at risk for suicide?: No Risk to Others:  no Past Medical History:  Past Medical History:  Diagnosis Date  . COPD (chronic obstructive pulmonary disease) ([place])   . Hypertension   . Lung cancer ([place])   . Thyroid disease     Past Surgical History:  Procedure Laterality Date  . CESAREAN SECTION CLASSICAL    . STOMACH SURGERY     Pt reports for a tumor   Family History:  Family History  Problem Relation Age of Onset  . Hypertension Father   . Cancer Father   . Diabetes Father   . Cancer Maternal Aunt    Family Psychiatric  History: daughter has depression Social History:  History  Alcohol Use  . 1.2 oz/week  .  2 Cans of beer per week     History  Drug Use No    Social History   Social History  . Marital status: Single    Spouse name: N/A  . Number of children: N/A  . Years of education: N/A   Social History Main Topics  . Smoking status: Current Every Day Smoker     Years: 15.00    Types: Cigarettes  . Smokeless tobacco: Never Used  . Alcohol use 1.2 oz/week    2 Cans of beer per week  . Drug use: No  . Sexual activity: Not Asked   Other Topics Concern  . None   Social History Narrative  . None   Additional Social History:    Allergies:   Allergies  Allergen Reactions  . No Known Allergies     Labs:  Results for orders placed or performed during the hospital encounter of 02/06/17 (from the past 48 hour(s))  Basic metabolic panel     Status: Abnormal   Collection Time: 02/06/17 10:31 PM  Result Value Ref Range   Sodium 140 135 - 145 mmol/L   Potassium 3.1 (L) 3.5 - 5.1 mmol/L   Chloride 115 (H) 101 - 111 mmol/L   CO2 17 (L) 22 - 32 mmol/L   Glucose, Bld 66 65 - 99 mg/dL   BUN 7 6 - 20 mg/dL   Creatinine, Ser 0.85 0.44 - 1.00 mg/dL   Calcium 7.5 (L) 8.9 - 10.3 mg/dL   GFR calc non Af Amer >60 >60 mL/min   GFR calc Af Amer >60 >60 mL/min    Comment: (NOTE) The eGFR has been calculated using the CKD EPI equation. This calculation has not been validated in all clinical situations. eGFR's persistently <60 mL/min signify possible Chronic Kidney Disease.    Anion gap 8 5 - 15  CBC     Status: Abnormal   Collection Time: 02/06/17 10:31 PM  Result Value Ref Range   WBC 4.8 3.6 - 11.0 K/uL   RBC 3.35 (L) 3.80 - 5.20 MIL/uL   Hemoglobin 11.4 (L) 12.0 - 16.0 g/dL   HCT 33.2 (L) 35.0 - 47.0 %   MCV 99.0 80.0 - 100.0 fL   MCH 34.1 (H) 26.0 - 34.0 pg   MCHC 34.5 32.0 - 36.0 g/dL   RDW 14.3 11.5 - 14.5 %   Platelets 251 150 - 440 K/uL  Hepatic function panel     Status: Abnormal   Collection Time: 02/06/17 10:31 PM  Result Value Ref Range   Total Protein 6.5 6.5 - 8.1 g/dL   Albumin 3.1 (L) 3.5 - 5.0 g/dL   AST 20 15 - 41 U/L   ALT 8 (L) 14 - 54 U/L   Alkaline Phosphatase 40 38 - 126 U/L   Total Bilirubin 0.9 0.3 - 1.2 mg/dL   Bilirubin, Direct 0.2 0.1 - 0.5 mg/dL   Indirect Bilirubin 0.7 0.3 - 0.9 mg/dL  Lactic acid,  plasma     Status: None   Collection Time: 02/07/17 12:29 AM  Result Value Ref Range   Lactic Acid, Venous 1.6 0.5 - 1.9 mmol/L  Glucose, capillary     Status: Abnormal   Collection Time: 02/07/17  1:24 AM  Result Value Ref Range   Glucose-Capillary 147 (H) 65 - 99 mg/dL  Urinalysis, Complete w Microscopic     Status: Abnormal   Collection Time: 02/07/17  1:39 AM  Result Value Ref Range   Color, Urine YELLOW (A)  YELLOW   APPearance CLEAR (A) CLEAR   Specific Gravity, Urine 1.027 1.005 - 1.030   pH 5.0 5.0 - 8.0   Glucose, UA 50 (A) NEGATIVE mg/dL   Hgb urine dipstick NEGATIVE NEGATIVE   Bilirubin Urine NEGATIVE NEGATIVE   Ketones, ur 5 (A) NEGATIVE mg/dL   Protein, ur NEGATIVE NEGATIVE mg/dL   Nitrite NEGATIVE NEGATIVE   Leukocytes, UA NEGATIVE NEGATIVE   RBC / HPF 0-5 0 - 5 RBC/hpf   WBC, UA 0-5 0 - 5 WBC/hpf   Bacteria, UA RARE (A) NONE SEEN   Squamous Epithelial / LPF 0-5 (A) NONE SEEN   Mucous PRESENT   Influenza panel by PCR (type A & B)     Status: None   Collection Time: 02/07/17  3:44 AM  Result Value Ref Range   Influenza A By PCR NEGATIVE NEGATIVE   Influenza B By PCR NEGATIVE NEGATIVE    Comment: (NOTE) The Xpert Xpress Flu assay is intended as an aid in the diagnosis of  influenza and should not be used as a sole basis for treatment.  This  assay is FDA approved for nasopharyngeal swab specimens only. Nasal  washings and aspirates are unacceptable for Xpert Xpress Flu testing.   CBC     Status: None   Collection Time: 02/07/17 11:07 AM  Result Value Ref Range   WBC 5.8 3.6 - 11.0 K/uL   RBC 3.89 3.80 - 5.20 MIL/uL   Hemoglobin 12.8 12.0 - 16.0 g/dL   HCT 38.7 35.0 - 47.0 %   MCV 99.5 80.0 - 100.0 fL   MCH 33.0 26.0 - 34.0 pg   MCHC 33.2 32.0 - 36.0 g/dL   RDW 14.0 11.5 - 14.5 %   Platelets 306 150 - 440 K/uL  Basic metabolic panel     Status: Abnormal   Collection Time: 02/07/17 11:07 AM  Result Value Ref Range   Sodium 138 135 - 145 mmol/L    Potassium 3.1 (L) 3.5 - 5.1 mmol/L   Chloride 107 101 - 111 mmol/L   CO2 21 (L) 22 - 32 mmol/L   Glucose, Bld 68 65 - 99 mg/dL   BUN <5 (L) 6 - 20 mg/dL   Creatinine, Ser 0.98 0.44 - 1.00 mg/dL   Calcium 8.4 (L) 8.9 - 10.3 mg/dL   GFR calc non Af Amer >60 >60 mL/min   GFR calc Af Amer >60 >60 mL/min    Comment: (NOTE) The eGFR has been calculated using the CKD EPI equation. This calculation has not been validated in all clinical situations. eGFR's persistently <60 mL/min signify possible Chronic Kidney Disease.    Anion gap 10 5 - 15  TSH     Status: Abnormal   Collection Time: 02/07/17 11:07 AM  Result Value Ref Range   TSH 77.876 (H) 0.350 - 4.500 uIU/mL    Comment: Performed by a 3rd Generation assay with a functional sensitivity of <=0.01 uIU/mL.  Magnesium     Status: Abnormal   Collection Time: 02/07/17 11:07 AM  Result Value Ref Range   Magnesium 1.5 (L) 1.7 - 2.4 mg/dL  Urine Drug Screen, Qualitative ([facility] only)     Status: Abnormal   Collection Time: 02/07/17  9:35 PM  Result Value Ref Range   Tricyclic, Ur Screen NONE DETECTED NONE DETECTED   Amphetamines, Ur Screen NONE DETECTED NONE DETECTED   MDMA (Ecstasy)Ur Screen NONE DETECTED NONE DETECTED   Cocaine Metabolite,Ur [place] NONE DETECTED NONE DETECTED   Opiate,  Ur Screen POSITIVE (A) NONE DETECTED   Phencyclidine (PCP) Ur S NONE DETECTED NONE DETECTED   Cannabinoid 50 Ng, Ur [place] POSITIVE (A) NONE DETECTED   Barbiturates, Ur Screen NONE DETECTED NONE DETECTED   Benzodiazepine, Ur Scrn NONE DETECTED NONE DETECTED   Methadone Scn, Ur NONE DETECTED NONE DETECTED    Comment: (NOTE) 284  Tricyclics, urine               Cutoff 1000 ng/mL 200  Amphetamines, urine             Cutoff 1000 ng/mL 300  MDMA (Ecstasy), urine           Cutoff 500 ng/mL 400  Cocaine Metabolite, urine       Cutoff 300 ng/mL 500  Opiate, urine                   Cutoff 300 ng/mL 600  Phencyclidine (PCP), urine      Cutoff 25 ng/mL 700  Cannabinoid,  urine              Cutoff 50 ng/mL 800  Barbiturates, urine             Cutoff 200 ng/mL 900  Benzodiazepine, urine           Cutoff 200 ng/mL 1000 Methadone, urine                Cutoff 300 ng/mL 1100 1200 The urine drug screen provides only a preliminary, unconfirmed 1300 analytical test result and should not be used for non-medical 1400 purposes. Clinical consideration and professional judgment should 1500 be applied to any positive drug screen result due to possible 1600 interfering substances. A more specific alternate chemical method 1700 must be used in order to obtain a confirmed analytical result.  1800 Gas chromato graphy / mass spectrometry (GC/MS) is the preferred 1900 confirmatory method.   Basic metabolic panel     Status: Abnormal   Collection Time: 02/08/17  4:25 AM  Result Value Ref Range   Sodium 138 135 - 145 mmol/L   Potassium 3.4 (L) 3.5 - 5.1 mmol/L   Chloride 109 101 - 111 mmol/L   CO2 20 (L) 22 - 32 mmol/L   Glucose, Bld 89 65 - 99 mg/dL   BUN <5 (L) 6 - 20 mg/dL   Creatinine, Ser 0.90 0.44 - 1.00 mg/dL   Calcium 8.2 (L) 8.9 - 10.3 mg/dL   GFR calc non Af Amer >60 >60 mL/min   GFR calc Af Amer >60 >60 mL/min    Comment: (NOTE) The eGFR has been calculated using the CKD EPI equation. This calculation has not been validated in all clinical situations. eGFR's persistently <60 mL/min signify possible Chronic Kidney Disease.    Anion gap 9 5 - 15  TSH     Status: Abnormal   Collection Time: 02/08/17  4:25 AM  Result Value Ref Range   TSH 77.000 (H) 0.350 - 4.500 uIU/mL    Comment: Performed by a 3rd Generation assay with a functional sensitivity of <=0.01 uIU/mL.  T4, free     Status: Abnormal   Collection Time: 02/08/17  4:25 AM  Result Value Ref Range   Free T4 <0.25 (L) 0.61 - 1.12 ng/dL    Comment: (NOTE) Biotin ingestion may interfere with free T4 tests. If the results are inconsistent with the TSH level, previous test results, or the clinical  presentation, then consider biotin interference. If needed, order repeat testing after  stopping biotin.     Current Facility-Administered Medications  Medication Dose Route Frequency Provider Last Rate Last Dose  . 0.9 % NaCl with KCl 20 mEq/ L  infusion   Intravenous Continuous [name], MD 75 mL/hr at 02/08/17 0328    . acetaminophen (TYLENOL) tablet 650 mg  650 mg Oral Q6H PRN Saundra Shelling, MD       Or  . acetaminophen (TYLENOL) suppository 650 mg  650 mg Rectal Q6H PRN Saundra Shelling, MD      . benzonatate (TESSALON) capsule 100 mg  100 mg Oral TID PRN Saundra Shelling, MD      . docusate sodium (COLACE) capsule 100 mg  100 mg Oral BID [name], MD   100 mg at 02/08/17 0859  . enoxaparin (LOVENOX) injection 40 mg  40 mg Subcutaneous Q24H Pavan Pyreddy, MD   40 mg at 25/63/89 3734  . folic acid (FOLVITE) tablet 1 mg  1 mg Oral Daily Loree Fee, RPH   1 mg at 02/08/17 1040  . lidocaine (XYLOCAINE) 2 % viscous mouth solution 10 mL  10 mL Mouth/Throat Q4H PRN Pavan Pyreddy, MD      . losartan (COZAAR) tablet 100 mg  100 mg Oral Daily Pavan Pyreddy, MD   100 mg at 02/08/17 1040  . magnesium oxide (MAG-OX) tablet 400 mg  400 mg Oral Daily Saundra Shelling, MD   400 mg at 02/08/17 1040  . MEDLINE mouth rinse  15 mL Mouth Rinse BID [name], MD      . megestrol (MEGACE) 40 MG/ML suspension 400 mg  400 mg Oral BID Saundra Shelling, MD   400 mg at 02/08/17 0900  . methimazole (TAPAZOLE) tablet 5 mg  5 mg Oral BID Saundra Shelling, MD   5 mg at 02/08/17 1040  . morphine (MS CONTIN) 12 hr tablet 15 mg  15 mg Oral BID Saundra Shelling, MD   15 mg at 02/08/17 1040  . nystatin (MYCOSTATIN) 100000 UNIT/ML suspension 500,000 Units  5 mL Oral QID [name], MD      . ondansetron (ZOFRAN) tablet 4 mg  4 mg Oral Q6H PRN Saundra Shelling, MD   4 mg at 02/07/17 0920   Or  . ondansetron (ZOFRAN) injection 4 mg  4 mg Intravenous Q6H PRN Saundra Shelling, MD   4 mg at 02/08/17 1046  . oxyCODONE (Oxy  IR/ROXICODONE) immediate release tablet 10 mg  10 mg Oral Q4H PRN Pavan Pyreddy, MD      . pantoprazole (PROTONIX) EC tablet 40 mg  40 mg Oral Daily Saundra Shelling, MD   40 mg at 02/08/17 0859  . potassium chloride (K-DUR) CR tablet 10 mEq  10 mEq Oral BID Saundra Shelling, MD   10 mEq at 02/08/17 0905  . senna (SENOKOT) tablet 8.6 mg  1 tablet Oral Daily [name], MD   8.6 mg at 02/08/17 1040  . tiotropium (SPIRIVA) inhalation capsule 18 mcg  18 mcg Inhalation Daily Saundra Shelling, MD   18 mcg at 02/08/17 0920    Musculoskeletal: Strength & Muscle Tone: decreased Gait & Station: unsteady Patient leans: N/A  Psychiatric Specialty Exam: Physical Exam  Nursing note and vitals reviewed.   Review of Systems  Constitutional: Positive for malaise/fatigue and weight loss.  Neurological: Positive for weakness.  Psychiatric/Behavioral: Positive for depression. The patient has insomnia.     Blood pressure (!) 169/100, pulse 77, temperature 97.9 F (36.6 C), resp. rate 18, height 5' 4"  (1.626 m), weight  60.6 kg (133 lb 8 oz), last menstrual period 01/30/2008, SpO2 97 %.Body mass index is 22.92 kg/m.  General Appearance: thin, lying in bed  Eye Contact:  Poor  Speech:  Slow  Volume:  Decreased  Mood:  Anxious and Depressed  Affect:  Congruent, Constricted and Depressed  Thought Process:  Goal Directed  Orientation:  ox3 except date  Thought Content:  hopelessness  Suicidal Thoughts:  No  Homicidal Thoughts:  No  Memory:  Short term memory impaired  Judgement:  Fair  Insight:  Fair  Psychomotor Activity:  Psychomotor Retardation  Concentration:  poor  Recall:  2/3  Fund of Knowledge:  Fair  Language:  Good  Akathisia:  No    AIMS (if indicated):     Assets:    ADL's:  poor  Cognition:  impaired  Sleep:        Treatment Plan Summary: Kirk Sampley  is a 56 y.o. female with a known history of lung cancer with metastasis,  thyroid disease, and multiple medical problems  presented to the emergency room with generalized weakness,admitted for dehydra tion, failure to thrive. Pt currently is severely depressed with some confusion/cognitive impairment.  TSH-77, has h/o hyperthyroidism with tx.  CT scan of the head  no evidence of metastasis. MRI brain- pending. Recommend- 1. Address thyroid issues. 2. Start Remeron 7.5 mg po qhs for depression, may also help with sleep and poor appetite. May increase dose to 15 mg if tolerated.   3. Call with questions. Disposition: Patient does not meet criteria for psychiatric inpatient admission.at this time. Please call again if psychiatric symptoms persists after medical clearance.  Lenward Chancellor, MD 02/08/2017 1:43 PM

## 2017-02-08 NOTE — Consult Note (Signed)
[facility]  [phone]) [phone] [phone]) [phone]  ID: Audrey Mcgee OB: [birth year]  MR#: [id number]  GYB#:[id number]  Patient Care Team: Donnie Coffin, MD as PCP - General (Family Medicine)  CHIEF COMPLAINT: Weakness, failure to thrive, stage IV lung cancer.  INTERVAL HISTORY: Patient is a 56 year old female with known stage IV adenocarcinoma the lung who was recently admitted to the hospital with increasing weakness, fatigue, and confusion. She has a poor appetite and minimal PO intake. She was recently evaluated by oncology on December 06, 2017, but did not receive chemotherapy at this time. Only IV fluids. Patient is a poor historian and review of systems is difficult. She has no neurologic complaints. She denies any fevers. She has no chest pain or shortness of breath. She complains of nausea, but denies any vomiting, constipation, or diarrhea. She has no urinary complaints. Patient is generally terrible, but offers no further specific complaints.  REVIEW OF SYSTEMS:   Review of Systems  Constitutional: Positive for malaise/fatigue and weight loss. Negative for fever.  Respiratory: Negative.  Negative for cough and shortness of breath.   Cardiovascular: Negative for chest pain and leg swelling.  Gastrointestinal: Positive for nausea. Negative for abdominal pain and vomiting.  Genitourinary: Negative.   Musculoskeletal: Negative.   Neurological: Positive for weakness.  Psychiatric/Behavioral: Positive for memory loss.    As per HPI. Otherwise, a complete review of systems is negative.  PAST MEDICAL HISTORY: Past Medical History:  Diagnosis Date  . COPD (chronic obstructive pulmonary disease) ([place])   . Hypertension   . Lung cancer ([place])   . Thyroid disease     PAST SURGICAL HISTORY: Past Surgical History:  Procedure Laterality Date  . CESAREAN SECTION CLASSICAL    . STOMACH SURGERY     Pt reports for a tumor    FAMILY HISTORY: Family  History  Problem Relation Age of Onset  . Hypertension Father   . Cancer Father   . Diabetes Father   . Cancer Maternal Aunt     ADVANCED DIRECTIVES (Y/N):  '@ADVDIR'$ @  HEALTH MAINTENANCE: Social History  Substance Use Topics  . Smoking status: Current Every Day Smoker    Years: 15.00    Types: Cigarettes  . Smokeless tobacco: Never Used  . Alcohol use 1.2 oz/week    2 Cans of beer per week     Colonoscopy:  PAP:  Bone density:  Lipid panel:  Allergies  Allergen Reactions  . No Known Allergies     Current Facility-Administered Medications  Medication Dose Route Frequency Provider Last Rate Last Dose  . 0.9 % NaCl with KCl 20 mEq/ L  infusion   Intravenous Continuous [name], MD 75 mL/hr at 02/08/17 0328    . acetaminophen (TYLENOL) tablet 650 mg  650 mg Oral Q6H PRN Saundra Shelling, MD       Or  . acetaminophen (TYLENOL) suppository 650 mg  650 mg Rectal Q6H PRN Saundra Shelling, MD      . benzonatate (TESSALON) capsule 100 mg  100 mg Oral TID PRN Saundra Shelling, MD      . docusate sodium (COLACE) capsule 100 mg  100 mg Oral BID [name], MD   100 mg at 02/08/17 0859  . enoxaparin (LOVENOX) injection 40 mg  40 mg Subcutaneous Q24H Pavan Pyreddy, MD   40 mg at 87/68/11 5726  . folic acid (FOLVITE) tablet 1 mg  1 mg Oral Daily Loree Fee, RPH   1 mg at 02/07/17 1127  .  lidocaine (XYLOCAINE) 2 % viscous mouth solution 10 mL  10 mL Mouth/Throat Q4H PRN Pavan Pyreddy, MD      . losartan (COZAAR) tablet 100 mg  100 mg Oral Daily Saundra Shelling, MD   100 mg at 02/07/17 1127  . magnesium oxide (MAG-OX) tablet 400 mg  400 mg Oral Daily Saundra Shelling, MD   400 mg at 02/07/17 1127  . magnesium sulfate IVPB 4 g 100 mL  4 g Intravenous Once [name], MD   4 g at 02/08/17 0857  . megestrol (MEGACE) 40 MG/ML suspension 400 mg  400 mg Oral BID Saundra Shelling, MD   400 mg at 02/08/17 0900  . methimazole (TAPAZOLE) tablet 5 mg  5 mg Oral BID Saundra Shelling, MD   5 mg at  02/07/17 2137  . morphine (MS CONTIN) 12 hr tablet 15 mg  15 mg Oral BID Saundra Shelling, MD   15 mg at 02/07/17 2137  . ondansetron (ZOFRAN) tablet 4 mg  4 mg Oral Q6H PRN Saundra Shelling, MD   4 mg at 02/07/17 0920   Or  . ondansetron (ZOFRAN) injection 4 mg  4 mg Intravenous Q6H PRN Saundra Shelling, MD   4 mg at 02/08/17 0328  . oxyCODONE (Oxy IR/ROXICODONE) immediate release tablet 10 mg  10 mg Oral Q4H PRN Pavan Pyreddy, MD      . pantoprazole (PROTONIX) EC tablet 40 mg  40 mg Oral Daily Saundra Shelling, MD   40 mg at 02/08/17 0859  . potassium chloride (K-DUR) CR tablet 10 mEq  10 mEq Oral BID Saundra Shelling, MD   10 mEq at 02/08/17 0905  . senna (SENOKOT) tablet 8.6 mg  1 tablet Oral Daily [name], MD   8.6 mg at 02/07/17 1645  . tiotropium (SPIRIVA) inhalation capsule 18 mcg  18 mcg Inhalation Daily Saundra Shelling, MD   18 mcg at 02/08/17 0920    OBJECTIVE: Vitals:   02/08/17 0338 02/08/17 0900  BP: (!) 155/95 (!) 169/100  Pulse: 75 77  Resp:  18  Temp:       Body mass index is 22.92 kg/m.    ECOG FS:2 - Symptomatic, <50% confined to bed  General: Thin, no acute distress. Eyes: Pink conjunctiva, anicteric sclera. HEENT: Normocephalic, moist mucous membranes, clear oropharnyx. Lungs: Clear to auscultation bilaterally. Heart: Regular rate and rhythm. No rubs, murmurs, or gallops. Abdomen: Soft, nontender, nondistended. No organomegaly noted, normoactive bowel sounds. Musculoskeletal: No edema, cyanosis, or clubbing. Neuro: Alert, answering all questions appropriately. Cranial nerves grossly intact. Skin: No rashes or petechiae noted. Psych: Normal affect.   LAB RESULTS:  Lab Results  Component Value Date   NA 138 02/08/2017   K 3.4 (L) 02/08/2017   CL 109 02/08/2017   CO2 20 (L) 02/08/2017   GLUCOSE 89 02/08/2017   BUN <5 (L) 02/08/2017   CREATININE 0.90 02/08/2017   CALCIUM 8.2 (L) 02/08/2017   PROT 6.5 02/06/2017   ALBUMIN 3.1 (L) 02/06/2017   AST 20 02/06/2017    ALT 8 (L) 02/06/2017   ALKPHOS 40 02/06/2017   BILITOT 0.9 02/06/2017   GFRNONAA >60 02/08/2017   GFRAA >60 02/08/2017    Lab Results  Component Value Date   WBC 5.8 02/07/2017   NEUTROABS 3.4 02/06/2017   HGB 12.8 02/07/2017   HCT 38.7 02/07/2017   MCV 99.5 02/07/2017   PLT 306 02/07/2017     STUDIES: Dg Abd 1 View  Result Date: 02/07/2017 CLINICAL DATA:  Nausea and  weakness. History of metastatic lung carcinoma EXAM: ABDOMEN - 1 VIEW COMPARISON:  None. FINDINGS: Contrast is noted in urinary bladder. No urinary bladder lesions are evident. There is no bowel dilatation or air-fluid level suggesting bowel obstruction. No free air. There are multiple phleboliths in the pelvis. IMPRESSION: Contrast in urinary bladder. No urinary bladder lesion evident. No bowel dilatation or air-fluid level suggesting bowel obstruction. No free air. Electronically Signed   By: Lowella Grip III M.D.   On: 02/07/2017 13:07   Ct Head W Or Wo Contrast  Result Date: 02/06/2017 CLINICAL DATA:  Initial evaluation for acute altered mental status. History of lung cancer. EXAM: CT HEAD WITHOUT AND WITH CONTRAST TECHNIQUE: Contiguous axial images were obtained from the base of the skull through the vertex without and with intravenous contrast CONTRAST:  74m ISOVUE-300 IOPAMIDOL (ISOVUE-300) INJECTION 61% COMPARISON:  None. FINDINGS: Brain: Generalized cerebral atrophy with mild chronic small vessel ischemic disease. No acute intracranial hemorrhage. No evidence for acute large vessel territory infarct. No mass lesion, midline shift or mass effect. No hydrocephalus. No extra-axial fluid collection. No abnormal enhancement. Vascular: No hyperdense vessel. Normal intravascular enhancement seen throughout the intracranial vasculature. Scattered vascular calcifications noted within the carotid siphons. Skull: Scalp soft tissues demonstrate no acute abnormality. Calvarium intact. Sinuses/Orbits: Globes and orbital soft  tissues within normal limits. Paranasal sinuses and mastoid air cells are clear. IMPRESSION: 1. No acute intracranial process identified. No evidence for intracranial metastatic disease. 2. Generalized age-related cerebral atrophy with mild chronic small vessel ischemic disease. Electronically Signed   By: BJeannine BogaM.D.   On: 02/06/2017 23:47   Nm Pet Image Restag (ps) Skull Base To Thigh  Result Date: 01/29/2017 CLINICAL DATA:  Subsequent treatment strategy for lung cancer. EXAM: NUCLEAR MEDICINE PET SKULL BASE TO THIGH TECHNIQUE: 12.7 mCi F-18 FDG was injected intravenously. Full-ring PET imaging was performed from the skull base to thigh after the radiotracer. CT data was obtained and used for attenuation correction and anatomic localization. FASTING BLOOD GLUCOSE:  Value: 55 mg/dl COMPARISON:  Multiple exams, including chest CT of 07/08/2016 FINDINGS: NECK No hypermetabolic lymph nodes in the neck. Mucous retention cyst in the right maxillary sinus. Bilateral station IIb hypermetabolic adenopathy noted with a right station IIa lymph node at the level of the hyoid also hypermetabolic. The more cephalad positioned right level IIb lymph node measures 1.1 cm in diameter and has a maximum standard uptake value of 17.5 small faintly hypermetabolic right supraclavicular lymph node noted. There is low-level diffuse metabolic activity in the thyroid gland which could reflect a mild thyroiditis. Supraglottic asymmetry without hypermetabolic activity in this vicinity. CHEST Right hilar and infrahilar hypermetabolic activity compatible with tumor. These nodes are difficult to measure due to similar density of the surrounding vessels. Right hilar node maximum SUV 13.5, conglomerate right infrahilar nodal activity maximum SUV 13.8. Mildly elevated left hemidiaphragm. Faint nodularity at the left lung apex without associated hypermetabolic activity. Stable scarring in the left upper lobe. Sub solid nodularity in  the left upper lobe and anteriorly in the left lower lobe without hypermetabolic activity. New partially solid 7 mm nodule in the left lower lobe on image 74/3, not hypermetabolic. ABDOMEN/PELVIS Lymph node interposed between the right diaphragmatic crus and the IVC in the upper abdomen has a short axis diameter of 0.9 cm and a maximum SUV of 17.2, compatible with metastatic spread. There are additional abnormal lymph nodes in the left periaortic region at and above the level of the left  renal vasculature with maximum SUV 19.4, and with probable involvement of the inferior portion of the left adrenal gland. Clustered nodes in this vicinity measure 1.4 by 2.8 cm on image 128/3. A left periaortic lymph node on image 149/3 measures 7 mm in short axis and has a maximum standard uptake value of 11.8, compatible with malignancy. Aortoiliac atherosclerotic vascular disease. On image 142/3 there is a 0.6 by 1.1 cm density within the lumen of the redundant sigmoid colon which is mildly hypermetabolic, maximum SUV 4.9, probably a small polyp. SKELETON No focal hypermetabolic activity to suggest skeletal metastasis. IMPRESSION: 1. Abnormal hypermetabolic adenopathy in the neck, chest, and upper abdomen with probable involvement of the left adrenal gland, suspicious for recurrent metastatic lung cancer. A new primary malignancy such as lymphoma is not entirely excluded but seems less likely. 2. Stable sub solid nodularity in the left upper lobe with a new partially solid 7 mm nodule in the left lower lobe, these merit surveillance but are not currently hypermetabolic. Electronically Signed   By: Van Clines M.D.   On: 01/29/2017 14:52   Dg Chest Portable 1 View  Result Date: 02/06/2017 CLINICAL DATA:  Acute onset of nausea and vomiting. Recent chemotherapy. Generalized weakness and lightheadedness. Initial encounter. EXAM: PORTABLE CHEST 1 VIEW COMPARISON:  Chest radiograph performed 11/09/2016, and PET/CT performed  01/29/2017 FINDINGS: The lungs are well-aerated and clear. There is mild elevation of the left hemidiaphragm. There is no evidence of focal opacification, pleural effusion or pneumothorax. Known left-sided pulmonary nodules are not well characterized on radiograph. The cardiomediastinal silhouette is within normal limits. No acute osseous abnormalities are seen. IMPRESSION: Mild elevation of the left hemidiaphragm. Lungs remain grossly clear. Known left-sided pulmonary nodules are not well characterized on radiograph. Electronically Signed   By: Garald Balding M.D.   On: 02/06/2017 23:29    ASSESSMENT: Weakness, failure to thrive, stage IV lung cancer.  PLAN:    1. Stage IV lung cancer: PET scan results reviewed independently and reported as above. Previously, patient declined biopsy of cervical lymph node mass to assess whether this for progression of disease or a second primary. It appears she has not received any treatment for her lung cancer since November 2017. Patient has been instructed to keep her previously scheduled follow-up appointment with Dr. Mike Gip on February 09, 2017 for further evaluation and consideration of reinitiating chemotherapy with Alimta. 2. Dehydration/failure to thrive: Likely multifactorial including possible progression of disease or second primary. Continue symptomatic treatment. 3. Thyroid disease: May be contributing to patient's weakness and fatigue, continue current treatment as indicated.  Appreciate consult, will follow.   Lloyd Huger, MD   02/08/2017 10:31 AM

## 2017-02-08 NOTE — Plan of Care (Signed)
Problem: Fluid Volume: Goal: Ability to maintain a balanced intake and output will improve Outcome: Not Progressing Poor PO intake. Encouraged pt to eat and drink. Pt ate 2xapple sauce during the shift. C/o nausea once. Zofran given with improvement. IVF infusing.   Problem: Nutrition: Goal: Adequate nutrition will be maintained Outcome: Not Progressing As above.

## 2017-02-08 NOTE — Plan of Care (Signed)
Problem: Nutrition: Goal: Adequate nutrition will be maintained Outcome: Not Progressing Patient continues to have poor appetite, Megace given per order. Pt continues to decline food.

## 2017-02-08 NOTE — Progress Notes (Signed)
[facility] [place] NAME: Audrey Mcgee    MR#:  [id number]  DATE OF BIRTH:  01-22-1961  SUBJECTIVE:  CHIEF COMPLAINT:   Chief Complaint  Patient presents with  . Weakness   The patient is 56 year old African-American female with past medical history significant for history of lung cancer, hypertension, thyroid disease, COPD, who presents to the hospital with complaints of generalized weakness, no energy, nausea, no vomiting, poor oral intake. In emergency room, she was felt to be dehydrated and was admitted. Tests were negative for flu and lactate was normal. Patient denied any abdominal discomfort, although admited of constipation for 3-4 days. She had to use enema about 3 days ago to produce bowel movements, the patient is on opiates chronically at home. KUB in the hospital showed no significant abnormalities, including no constipation, ileus Patient complained of dysphagia, food getting stuck in lower neck area, solid food as well as liquids. She was very tube, gastroenterologist, upper GI series was recommended as well as swallowing evaluation. TSH came back high. Urine drug screen was positive for opiates as well as cannabinoids.    Review of Systems  Unable to perform ROS: Mental acuity    VITAL SIGNS: Blood pressure (!) 169/100, pulse 77, temperature 97.9 F (36.6 C), resp. rate 18, height '5\' 4"'$  (1.626 m), weight 60.6 kg (133 lb 8 oz), last menstrual period 01/30/2008, SpO2 97 %.  PHYSICAL EXAMINATION:   GENERAL:  56 y.o.-year-old patient lying in the bed with no acute distress. Slow to respond, somnolent EYES: Pupils equal, round, reactive to light and accommodation. No scleral icterus. Extraocular muscles intact.  HEENT: Head atraumatic, normocephalic. Oropharynx and nasopharynx clear.  NECK:  Supple, no jugular venous distention. No thyroid enlargement, no tenderness.  LUNGS: Normal breath sounds bilaterally, no wheezing,  rales,rhonchi or crepitation. No use of accessory muscles of respiration.  CARDIOVASCULAR: S1, S2 normal. No murmurs, rubs, or gallops.  ABDOMEN: Soft, nontender, nondistended. Bowel sounds present. No organomegaly or mass.  EXTREMITIES: No pedal edema, cyanosis, or clubbing.  NEUROLOGIC: Cranial nerves II through XII are intact. Muscle strength 5/5 in all extremities. Sensation intact. Gait not checked.  PSYCHIATRIC: The patient is somnolent,, but oriented x 3. Masslike face, very slow to respond to questions, it takes her at least 2 minutes to ask me something, then she forgets what she wanted to ask SKIN: No obvious rash, lesion, or ulcer.   ORDERS/RESULTS REVIEWED:   CBC  Recent Labs Lab 02/06/17 0827 02/06/17 2231 02/07/17 1107  WBC 5.2 4.8 5.8  HGB 14.3 11.4* 12.8  HCT 40.5 33.2* 38.7  PLT 378 251 306  MCV 97.3 99.0 99.5  MCH 34.2* 34.1* 33.0  MCHC 35.2 34.5 33.2  RDW 14.2 14.3 14.0  LYMPHSABS 1.3  --   --   MONOABS 0.4  --   --   EOSABS 0.1  --   --   BASOSABS 0.0  --   --    ------------------------------------------------------------------------------------------------------------------  Chemistries   Recent Labs Lab 02/06/17 0827 02/06/17 2231 02/07/17 1107 02/08/17 0425  NA 136 140 138 138  K 3.0* 3.1* 3.1* 3.4*  CL 102 115* 107 109  CO2 22 17* 21* 20*  GLUCOSE 88 66 68 89  BUN 9 7 <5* <5*  CREATININE 1.07* 0.85 0.98 0.90  CALCIUM 9.4 7.5* 8.4* 8.2*  MG 1.8  --  1.5*  --   AST 20 20  --   --   ALT  8* 8*  --   --   ALKPHOS 54 40  --   --   BILITOT 1.2 0.9  --   --    ------------------------------------------------------------------------------------------------------------------ estimated creatinine clearance is 62.4 mL/min (by C-G formula based on SCr of 0.9 mg/dL). ------------------------------------------------------------------------------------------------------------------  Recent Labs  02/08/17 0425  TSH 77.000*    Cardiac Enzymes No  results for input(s): CKMB, TROPONINI, MYOGLOBIN in the last 168 hours.  Invalid input(s): CK ------------------------------------------------------------------------------------------------------------------ Invalid input(s): POCBNP ---------------------------------------------------------------------------------------------------------------  RADIOLOGY: Dg Abd 1 View  Result Date: 02/07/2017 CLINICAL DATA:  Nausea and weakness. History of metastatic lung carcinoma EXAM: ABDOMEN - 1 VIEW COMPARISON:  None. FINDINGS: Contrast is noted in urinary bladder. No urinary bladder lesions are evident. There is no bowel dilatation or air-fluid level suggesting bowel obstruction. No free air. There are multiple phleboliths in the pelvis. IMPRESSION: Contrast in urinary bladder. No urinary bladder lesion evident. No bowel dilatation or air-fluid level suggesting bowel obstruction. No free air. Electronically Signed   By: Lowella Grip III M.D.   On: 02/07/2017 13:07   Ct Head W Or Wo Contrast  Result Date: 02/06/2017 CLINICAL DATA:  Initial evaluation for acute altered mental status. History of lung cancer. EXAM: CT HEAD WITHOUT AND WITH CONTRAST TECHNIQUE: Contiguous axial images were obtained from the base of the skull through the vertex without and with intravenous contrast CONTRAST:  46m ISOVUE-300 IOPAMIDOL (ISOVUE-300) INJECTION 61% COMPARISON:  None. FINDINGS: Brain: Generalized cerebral atrophy with mild chronic small vessel ischemic disease. No acute intracranial hemorrhage. No evidence for acute large vessel territory infarct. No mass lesion, midline shift or mass effect. No hydrocephalus. No extra-axial fluid collection. No abnormal enhancement. Vascular: No hyperdense vessel. Normal intravascular enhancement seen throughout the intracranial vasculature. Scattered vascular calcifications noted within the carotid siphons. Skull: Scalp soft tissues demonstrate no acute abnormality. Calvarium intact.  Sinuses/Orbits: Globes and orbital soft tissues within normal limits. Paranasal sinuses and mastoid air cells are clear. IMPRESSION: 1. No acute intracranial process identified. No evidence for intracranial metastatic disease. 2. Generalized age-related cerebral atrophy with mild chronic small vessel ischemic disease. Electronically Signed   By: BJeannine BogaM.D.   On: 02/06/2017 23:47   Dg Chest Portable 1 View  Result Date: 02/06/2017 CLINICAL DATA:  Acute onset of nausea and vomiting. Recent chemotherapy. Generalized weakness and lightheadedness. Initial encounter. EXAM: PORTABLE CHEST 1 VIEW COMPARISON:  Chest radiograph performed 11/09/2016, and PET/CT performed 01/29/2017 FINDINGS: The lungs are well-aerated and clear. There is mild elevation of the left hemidiaphragm. There is no evidence of focal opacification, pleural effusion or pneumothorax. Known left-sided pulmonary nodules are not well characterized on radiograph. The cardiomediastinal silhouette is within normal limits. No acute osseous abnormalities are seen. IMPRESSION: Mild elevation of the left hemidiaphragm. Lungs remain grossly clear. Known left-sided pulmonary nodules are not well characterized on radiograph. Electronically Signed   By: JGarald BaldingM.D.   On: 02/06/2017 23:29    EKG:  Orders placed or performed during the hospital encounter of 02/06/17  . ED EKG  . ED EKG  . EKG    ASSESSMENT AND PLAN:  Active Problems:   Dehydration  #1. Altered mental state , likely multifactorial, due to marijuana abuse, opiates, clinical hypothyroidism, question of depression. Getting psychiatrist involved for recommendations. Getting brain MRI to rule out metastasis. Appreciate oncologist input #2. Intermittent nausea and vomiting, discussed with the patient that marijuana can sometimes induce nausea and vomiting counseled to stop, continue patient on  Protonix, Zofran, IV  fluids,  KUB was negative for constipation, barium  swallowing, SLP test, appreciate gastroenterologist input, possible EGD while in the hospital, may need to have PEG tube placement if the patient's oral intake remains NOT satisfactory, getting palliative care involved to discuss this patient's family #3. Failure to thrive adult, continue patient on Megace, Protonix, appreciate oncologist input, patient may need to be initiated on PEG tube feeds, if oral intake does not improve with conservative measures. Getting barium swallowing study #4. Hypokalemia, supplementing orally, check magnesium level #5 hypothyroidism, stop methimazole, follow clinically. Course of the Thyroid disease is unclear, but obviously methimazole is not needed at this time. #6 lung cancer, unknown stage IV adenocarcinoma of the lung, patient was seen by oncologist during this admission,, it was noted that patient has not received any treatment for lung cancer since November 2017. She was instructed by oncologist to follow up with Dr. Mike Gip on 02/09/2017 for further evaluation and consideration of chemotherapy. Brain MRI, although bone scan 01/29/2017 did not show abnormalities in brain/skull.    Management plans discussed with the patient, gastroenterologist and they are in agreement.   DRUG ALLERGIES:  Allergies  Allergen Reactions  . No Known Allergies     CODE STATUS:     Code Status Orders        Start     Ordered   02/07/17 1026  Full code  Continuous     02/07/17 1026    Code Status History    Date Active Date Inactive Code Status Order ID Comments User Context   11/07/2016  6:18 AM 11/11/2016  2:39 PM Full Code [id number]  Harrie Foreman, MD Inpatient   06/21/2016 11:52 AM 06/22/2016  3:07 PM Full Code [id number]  [name], MD Inpatient      TOTAL TIME TAKING CARE OF THIS PATIENT: 40 minutes.    [name] M.D on 02/08/2017 at 2:12 PM  Between 7am to 6pm - Pager - [phone]  After 6pm go to [email or web address] - password [email or web address] [email or web address]  Office  [phone]  CC: Primary care physician; Donnie Coffin, MD

## 2017-02-09 LAB — TSH: TSH: 80 u[IU]/mL — ABNORMAL HIGH (ref 0.350–4.500)

## 2017-02-09 IMAGING — CT CT ANGIO CHEST
2 of 6 series · 19 of 46 positions shown · IV contrast (APPLIED)
Comparison: 06/21/2016

CLINICAL DATA: History of lung cancer.

EXAM:
CT ANGIOGRAPHY CHEST WITH CONTRAST
TECHNIQUE: Multidetector CT imaging of the chest was performed using the
standard protocol during bolus administration of intravenous
contrast. Multiplanar CT image reconstructions and MIPs were
obtained to evaluate the vascular anatomy.
CONTRAST:  75 cc Isovue 370

[Series 5: pe thins 1.5 · axial · 0.68mm/px · z∈[-486,-244]mm · 16 of 226 slices shown]
[im 12/226  lung]
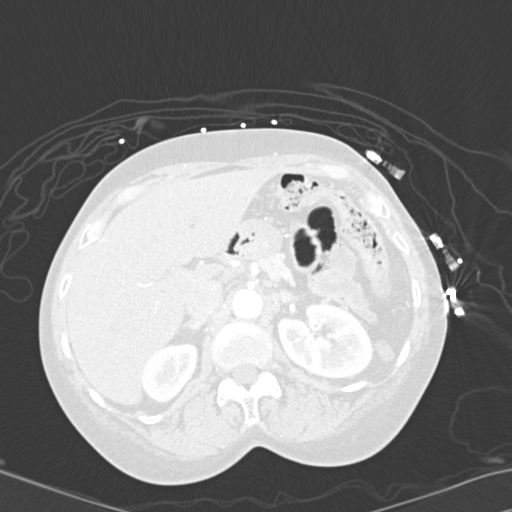
[im 24/226  soft-tissue]
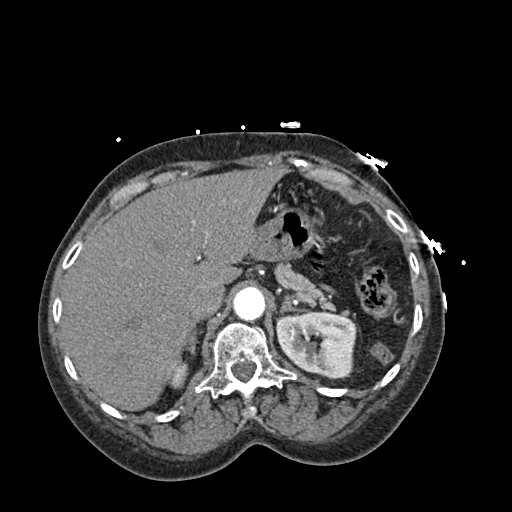
[im 36/226  lung]
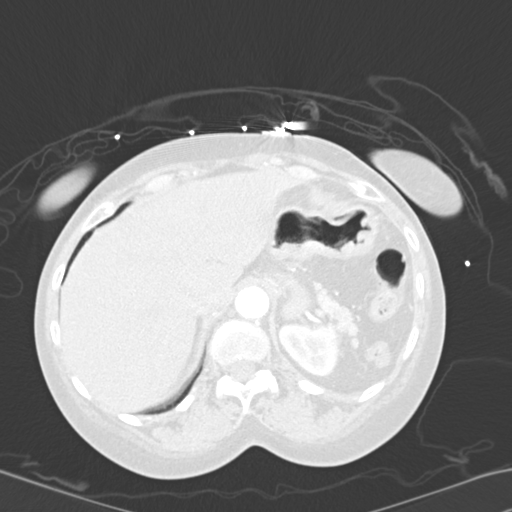
[im 48/226  soft-tissue]
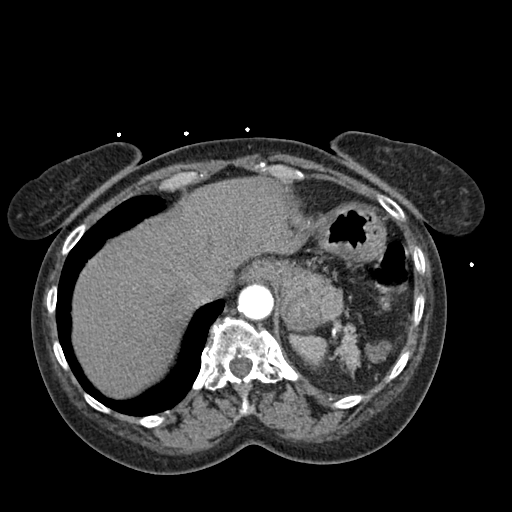
[im 72/226  lung]
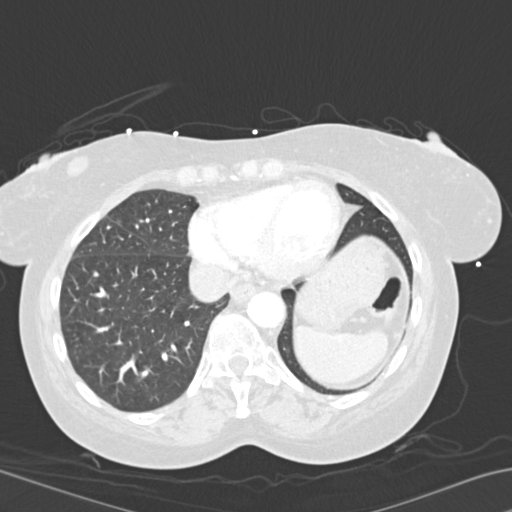
[im 83/226  soft-tissue]
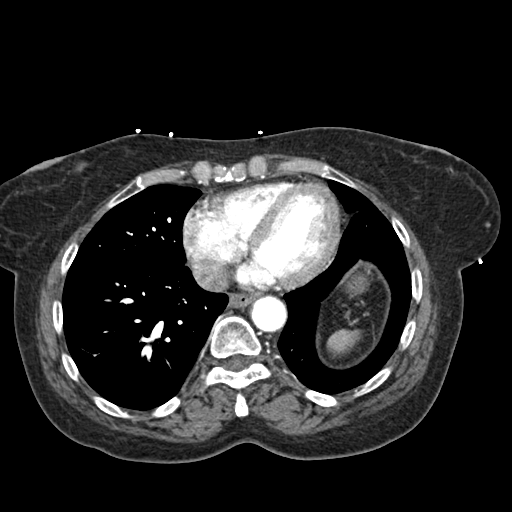
[im 95/226  lung]
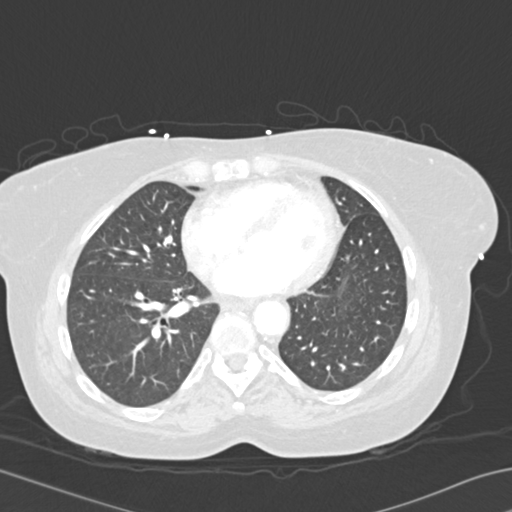
[im 107/226  soft-tissue]
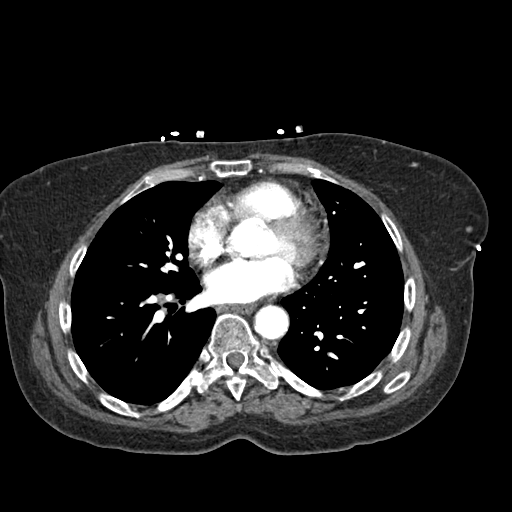
[im 119/226  lung]
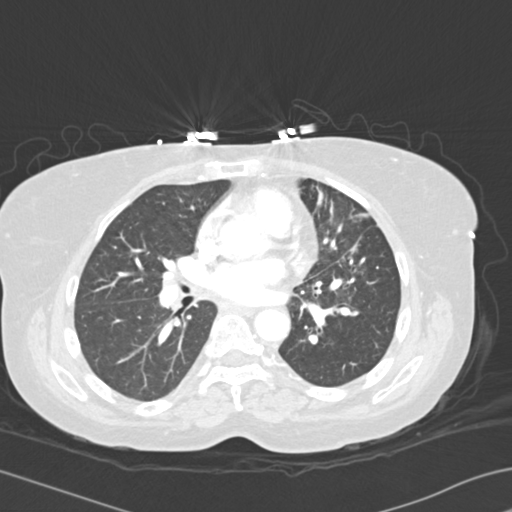
[im 131/226  soft-tissue]
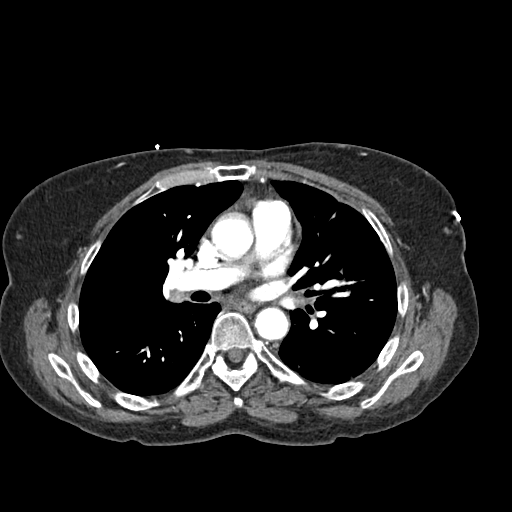
[im 143/226  lung]
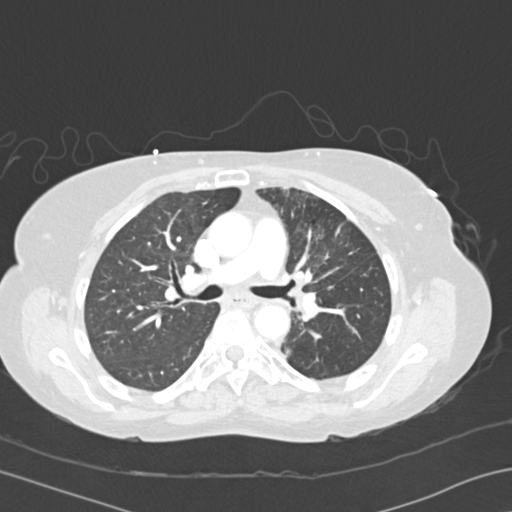
[im 154/226  soft-tissue]
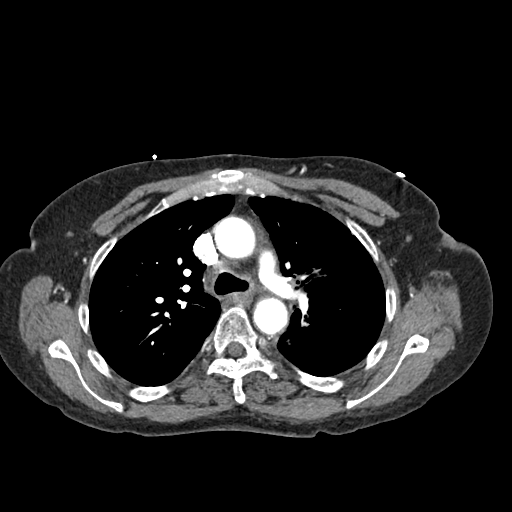
[im 178/226  lung]
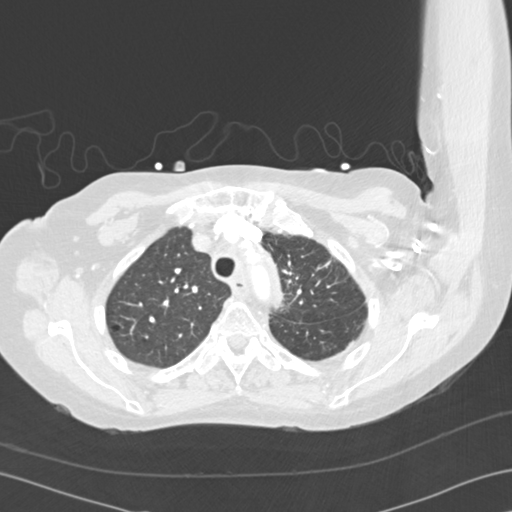
[im 190/226  soft-tissue]
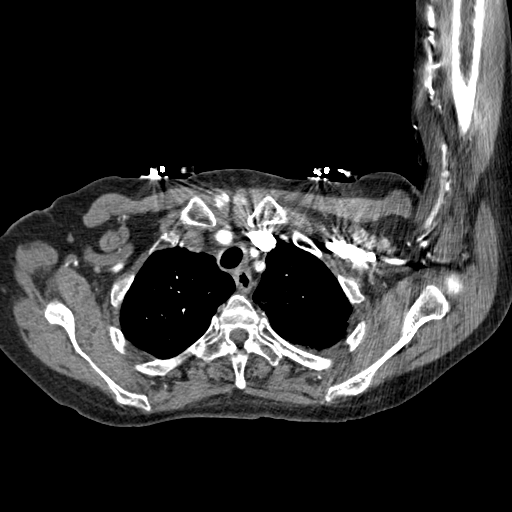
[im 202/226  lung]
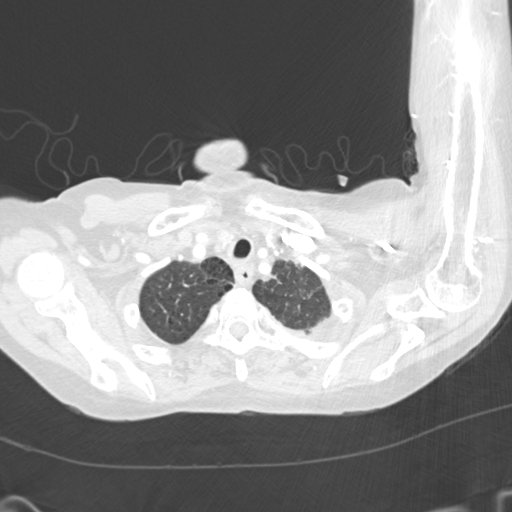
[im 214/226  soft-tissue]
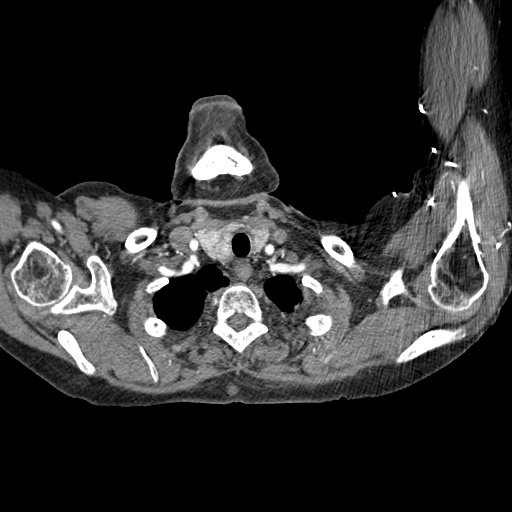

[Series 7: cor mpr 2.0 · coronal · 0.68mm/px · 3 of 104 slices shown]
[im 26/104  soft-tissue]
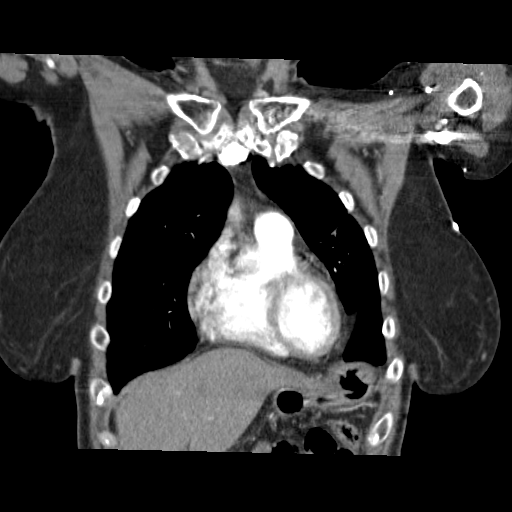
[im 52/104  soft-tissue]
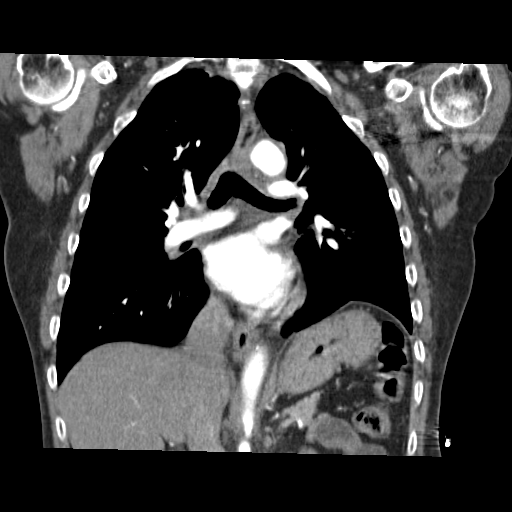
[im 78/104  soft-tissue]
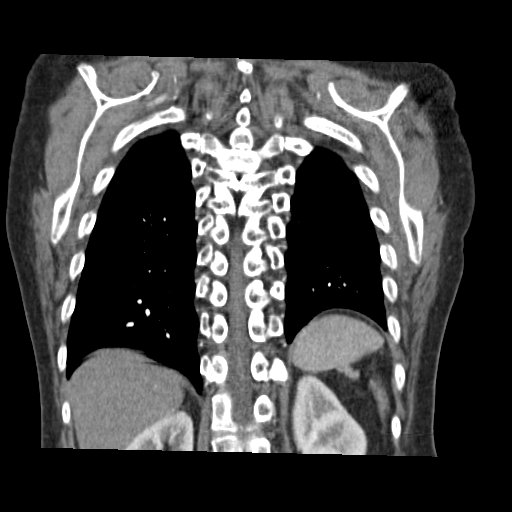

[19 of 46 positions shown; findings below may reference images not displayed]

FINDINGS: Mediastinum / Lymph Nodes: There is no axillary lymphadenopathy. No
mediastinal lymphadenopathy. Stable appearance small right hilar
lymph nodes. Heart size is normal. No pericardial effusion. The
esophagus has normal imaging features. No filling defects in the
opacified pulmonary arteries to suggest the presence of an acute
pulmonary embolus. No thoracic aortic aneurysm. There is no
dissection of the thoracic aorta.

Lungs / Pleura: As before, there is bronchiectasis identified in the
left upper lobe and anterior left lower lobe with a scattered patchy
opacities, mainly in the left upper lobe, stable. No evidence for
interval development focal airspace consolidation. No pulmonary
edema or pleural effusion.

Upper Abdomen:  Unremarkable.

[HOSPITAL] / Soft Tissues: Bone windows reveal no worrisome lytic or
sclerotic osseous lesions. Stone

Review of the MIP images confirms the above findings.
IMPRESSION: 1. No CT evidence for acute pulmonary embolus.
2. Stable appearance of patchy peribronchovascular patchy
interstitial thickening with associated bronchiectasis. This may be
treatment related.

## 2017-02-09 MED ORDER — ENSURE ENLIVE PO LIQD
237.0000 mL | Freq: Three times a day (TID) | ORAL | Status: DC
  Administered 2017-02-09 – 2017-02-21 (×26): 237 mL via ORAL

## 2017-02-09 MED ORDER — METOPROLOL TARTRATE 50 MG PO TABS
50.0000 mg | ORAL_TABLET | Freq: Two times a day (BID) | ORAL | Status: DC
  Administered 2017-02-09 – 2017-02-21 (×22): 50 mg via ORAL
  Filled 2017-02-09 (×27): qty 1

## 2017-02-09 MED ORDER — BOOST / RESOURCE BREEZE PO LIQD
1.0000 | Freq: Two times a day (BID) | ORAL | Status: DC
  Administered 2017-02-09 – 2017-02-21 (×11): 1 via ORAL

## 2017-02-09 NOTE — Progress Notes (Signed)
Initial Nutrition Assessment  DOCUMENTATION CODES:   Severe malnutrition in context of chronic illness  INTERVENTION:  Provide Ensure Enlive po TID, each supplement provides 350 kcal and 20 grams of protein. Ordered through [facility] to come TID on meal trays mixed in ice cream to make milkshake.  Provide Boost Breeze po BID between meals, each supplement provides 250 kcal and 9 grams of protein.   Due to patient's confusion and lethargy unable to determine adequacy of oral intake prior to admission. Will monitor her response to Megace and oral nutrition supplements. If patient to continue receiving supportive care and treatment for cancer and is unable to meet her needs with PO intake, recommend discussing option of PEG tube with patient and family.  NUTRITION DIAGNOSIS:   Inadequate oral intake related to poor appetite, cancer and cancer related treatments, nausea as evidenced by other (see comment) (per RN and SLP report).  GOAL:   Patient will meet greater than or equal to 90% of their needs  MONITOR:   PO intake, Supplement acceptance, Labs, I & O's, Weight trends  REASON FOR ASSESSMENT:   Consult Assessment of nutrition requirement/status  ASSESSMENT:   56 year old female with PMHx of HTN, COPD, lung cancer who presents with generalized weakness, nausea, poor oral intake.    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 She received palliative radiation to the left lung from 10/24/2014-11/07/2014. She received 6 cycles carboplatin and Alimta from 11/14/2014-04/30/2015. She has received 6 cycles of maintenance Alimta and (started 6th cycle 10/31/2016). She receives B12 every 9 weeks (last 12/23/2016) and is on daily folic acid. -PET scan 01/29/2017 revealed abnormal hypermetabolic adenopathy in neck, chest, and upper abdomen with probable involvement of left adrenal gland. Per Oncology note suspicious for recurrent metastatic lung  cancer.  Patient known to this RD from previous admission. Patient seems much more confused and lethargic and does not seem to be answering questions appropriately. Patient is reporting that she has a good appetite and is eating 100% of three meals daily and a snack. However, this report does not align with reports in chart. Per chart patient admitted with poor PO intake. RN reports patient did not eat over the weekend. Per SLP patient only had 5 bites during assessment and then reported she was full. Patient had not touched breakfast tray at time of RD assessment. Patient still reporting difficulty swallowing and nausea. She has not been drinking any oral nutrition supplements at home.   At last assessment patient had reported UBW of 127 lbs, but she was unable to provide weight history at this assessment. RD obtained bed scale weight of 122.7 lbs. Unsure of accuracy of weight trend as weights in chart have ranged from 107 lbs to 144 lbs the past few months.  Medications reviewed and include: Colace, folic acid 1 mg daily, magnesium oxide 400 mg daily, Megace 400 mg BID, pantoprazole, senna.  Labs reviewed: Potassium 3.4, CO2 20, BUN <5.   Nutrition-Focused physical exam completed. Findings are severe fat depletion, severe muscle depletion, and no edema.   Diet Order:  Diet 2 gram sodium Room service appropriate? Yes with Assist; Fluid consistency: Thin  Skin:  Reviewed, no issues  Last BM:  02/06/2017  Height:   Ht Readings from Last 1 Encounters:  02/07/17 '5\' 4"'$  (1.626 m)    Weight:   Wt Readings from Last 1 Encounters:  02/09/17 122 lb 11.2 oz (55.7 kg)    Ideal Body Weight:  54.5  kg  BMI:  Body mass index is 21.06 kg/m.  Estimated Nutritional Needs:   Kcal:  1670-1950 (30-35 kcal/kg)  Protein:  85-95 grams (1.5-1.7 grams/kg)  Fluid:  1.6 L/day (30 ml/kg)  EDUCATION NEEDS:   Education needs no appropriate at this time  Willey Blade, MS, RD, LDN Pager:  [phone] After Hours Pager: [phone]

## 2017-02-09 NOTE — Evaluation (Signed)
Clinical/Bedside Swallow Evaluation Patient Details  Name: Audrey Mcgee MRN: [id number] Date of Birth: [birth year]  Today's Date: 02/09/2017 Time: SLP Start Time (ACUTE ONLY): 1400 SLP Stop Time (ACUTE ONLY): 1500 SLP Time Calculation (min) (ACUTE ONLY): 60 min  Past Medical History:  Past Medical History:  Diagnosis Date  . COPD (chronic obstructive pulmonary disease) ([place])   . Hypertension   . Lung cancer ([place])   . Thyroid disease    Past Surgical History:  Past Surgical History:  Procedure Laterality Date  . CESAREAN SECTION CLASSICAL    . STOMACH SURGERY     Pt reports for a tumor   HPI:  Pt is a 56 year-old female with known stage IV adenocarcinoma the lung who was recently admitted to the hospital with increasing weakness, fatigue, and confusion. She has a poor appetite and minimal PO intake. She was recently evaluated by oncology on December 06, 2017, but did not receive chemotherapy at this time. Only IV fluids. Patient is a poor historian and review of systems is difficult. She has no neurologic complaints. She denies any fevers. She has no chest pain or shortness of breath. She complains of nausea, but denies any vomiting, constipation, or diarrhea. She has no urinary complaints. Patient is generally feels terrible, but offers no further specific complaints. Recently, an abnormal PET scan 01-23-17 suggestive of advanced cancer in neck, chest and upper abdomen who admitted for failure to thrive. Dehydrated and poor appetite. Reports some difficulty swallowing some solids at level of sternal notch.  Also complains of nausea. Pt stated "I can swallow" but only accepts bites and sips usually of liquids and puree-type foods. Pt is intermittently awake; does receive pain medications. She has refused an MRI today per NSG; pt stated she "just needed time" when asked about any further assessment of her swallowing. Some confusion noted as she conversed upon waking intermittently. Pt has  only accepted bites/sips during the day similar to at home per report.   Assessment / Plan / Recommendation Clinical Impression  Pt appears at reduced risk for aspiration w/ a modified diet and when following general aspiration precautions. Pt consumed (few)trials of thin liquids via cup/straw and puree-type foods w/ no immediate, overt s/s of aspiration noted - clear vocal quality noted post trials and no decline in respiratory status noted w/ trials. Pt exhibited adequate bolus management of these trial consistencies w/ adequate oral clearing. However, pt did not accept any trials of solid foods and only accpeted a few trials total at this eval resulting in a somewhat limited assessment overall - unable to adequately assess for any Esophageal phase discomfort, however, noted pt exhibited moderate belching prior to trials and post trials. Pt tended to wince as she swallowed but denied any notable pain; NSG is giving a Nystatin oral rinse as treatment(unsure if pt has any thrush). Per report, pt has had reduced oral intake for some time now. Noted reports of Cancer and lack of ongoing treatment per Oncology. Unsure if pt's lack of intake is d/t this or a physiological swallowing deficit. Pt declined wanting to have an objective swallowing assessment at this time. Palliative Care is consulted and is meeting w/ faimly for POC tomorrow. Will await further assessment until after then. Recommend continue an oral diet w/ thin liquids w/ general aspiration precautions; foods of pt's preference and broken down/moist for easier swallowing. Recommend Dietician f/u for nutirtional supplement(drink form); Pills given Crushed in Puree for easier swallowing. ST services will f/u tomorrow for  ongoing assessment and education as needed. Pt agreed. NSG updated.  SLP Visit Diagnosis: Dysphagia, oropharyngeal phase (R13.12)    Aspiration Risk  Mild aspiration risk    Diet Recommendation  Dysphagia level 1/2 w/ thin liquids;  general aspiration precautions - only eat/drink when fully awake and engaged in task of self-feeding. Reflux precautions.   Medication Administration: Crushed with puree    Other  Recommendations Recommended Consults: Consider GI evaluation (Dietician consulted) Oral Care Recommendations: Oral care BID;Staff/trained caregiver to provide oral care   Follow up Recommendations  (TBD)      Frequency and Duration min 3x week  2 weeks       Prognosis Prognosis for Safe Diet Advancement: Fair Barriers to Reach Goals: Cognitive deficits;Severity of deficits;Motivation      Swallow Study   General Date of Onset: 02/06/17 HPI: Pt is a 56 year-old female with known stage IV adenocarcinoma the lung who was recently admitted to the hospital with increasing weakness, fatigue, and confusion. She has a poor appetite and minimal PO intake. She was recently evaluated by oncology on December 06, 2017, but did not receive chemotherapy at this time. Only IV fluids. Patient is a poor historian and review of systems is difficult. She has no neurologic complaints. She denies any fevers. She has no chest pain or shortness of breath. She complains of nausea, but denies any vomiting, constipation, or diarrhea. She has no urinary complaints. Patient is generally feels terrible, but offers no further specific complaints. Recently, an abnormal PET scan 01-23-17 suggestive of advanced cancer in neck, chest and upper abdomen who admitted for failure to thrive. Dehydrated and poor appetite. Reports some difficulty swallowing some solids at level of sternal notch.  Also complains of nausea. Pt stated "I can swallow" but only accepts bites and sips usually of liquids and puree-type foods. Pt is intermittently awake; does receive pain medications. She has refused an MRI today per NSG; pt stated she "just needed time" when asked about any further assessment of her swallowing. Some confusion noted as she conversed upon waking  intermittently. Pt has only accepted bites/sips during the day similar to at home per report. Type of Study: Bedside Swallow Evaluation Previous Swallow Assessment: none Diet Prior to this Study: Dysphagia 1 (puree);Dysphagia 2 (chopped);Thin liquids (as described) Temperature Spikes Noted: No (wbc not elevated 5.8) Respiratory Status: Room air History of Recent Intubation: No Behavior/Cognition: Lethargic/Drowsy;Distractible;Requires cueing;Cooperative;Pleasant mood;Confused Oral Cavity Assessment: Dry (sticky) Oral Care Completed by SLP: Recent completion by staff Oral Cavity - Dentition: Adequate natural dentition Vision: Functional for self-feeding Self-Feeding Abilities: Able to feed self;Needs assist;Needs set up Patient Positioning: Upright in bed Baseline Vocal Quality: Normal (mumbled, low speech when drowsy) Volitional Cough: Strong Volitional Swallow: Able to elicit    Oral/Motor/Sensory Function Overall Oral Motor/Sensory Function: Within functional limits   Ice Chips Ice chips: Within functional limits Presentation: Spoon (fed; 2 trials)   Thin Liquid Thin Liquid: Within functional limits (grossly) Presentation: Cup;Self Fed;Straw (3 trials total) Other Comments: pt winced when she swallowed; denied pain. Refused further trials.     Nectar Thick Nectar Thick Liquid: Not tested   Honey Thick Honey Thick Liquid: Not tested   Puree Puree: Within functional limits Presentation: Self Fed;Spoon (2 trials) Other Comments: refused further trials; winced again w/ swallow   Solid   GO   Solid: Not tested Other Comments: refused trials    Functional Assessment Tool Used: clinical judgement Functional Limitations: Swallowing Swallow Current Status (U9323): At least 20  percent but less than 40 percent impaired, limited or restricted Swallow Goal Status (U8280): At least 20 percent but less than 40 percent impaired, limited or restricted Swallow Discharge Status [phone]): At  least 20 percent but less than 40 percent impaired, limited or restricted      Orinda Kenner, MS, CCC-SLP Watson,Katherine 02/09/2017,4:49 PM

## 2017-02-09 NOTE — Progress Notes (Signed)
[facility] NAME: Audrey Mcgee    MR#:  [id number]  DATE OF BIRTH:  12/03/1961  SUBJECTIVE:  CHIEF COMPLAINT:   Chief Complaint  Patient presents with  . Weakness   The patient is 56 year old African-American female with past medical history significant for history of lung cancer, hypertension, thyroid disease, COPD, who presents to the hospital with complaints of generalized weakness, no energy, nausea, no vomiting, poor oral intake. In emergency room, she was felt to be dehydrated and was admitted. Tests were negative for flu and lactate was normal. Patient denied any abdominal discomfort, although admited of constipation for 3-4 days. She had to use enema about 3 days ago to produce bowel movements, the patient is on opiates chronically at home. KUB in the hospital showed no significant abnormalities, including no constipation, ileus Patient complained of dysphagia, food getting stuck in lower neck area, solid food as well as liquids. She was very tube, gastroenterologist, upper GI series was recommended as well as swallowing evaluation. TSH came back high. Urine drug screen was positive for opiates as well as cannabinoids.  Patient is somewhat confused today, not able to answer simple questions. TSH is high  80, now off methimazole. The patient was seen by psychiatrist yesterday, initiated on Remeron, oral intake has improved to 100% of offered to meals  Review of Systems  Unable to perform ROS: Mental acuity    VITAL SIGNS: Blood pressure (!) 155/90, pulse 78, temperature 98 F (36.7 C), resp. rate (!) 22, height '5\' 4"'$  (1.626 m), weight 55.7 kg (122 lb 11.2 oz), last menstrual period 01/30/2008, SpO2 100 %.  PHYSICAL EXAMINATION:   GENERAL:  56 y.o.-year-old patient lying in the bed with no acute distress. Slow to respond, somnolent EYES: Pupils equal, round, reactive to light and accommodation. No scleral icterus.  Extraocular muscles intact.  HEENT: Head atraumatic, normocephalic. Oropharynx and nasopharynx clear.  NECK:  Supple, no jugular venous distention. No thyroid enlargement, no tenderness.  LUNGS: Normal breath sounds bilaterally, no wheezing, rales,rhonchi or crepitation. No use of accessory muscles of respiration.  CARDIOVASCULAR: S1, S2 normal. No murmurs, rubs, or gallops.  ABDOMEN: Soft, nontender, nondistended. Bowel sounds present. No organomegaly or mass.  EXTREMITIES: No pedal edema, cyanosis, or clubbing.  NEUROLOGIC: Cranial nerves II through XII are intact. Muscle strength 5/5 in all extremities. Sensation intact. Gait not checked.  PSYCHIATRIC: The patient is somnolent,, but oriented x 3. Masslike face, very slow to respond to questions, it takes her at least 2 minutes to ask me something, then she forgets what she wanted to ask SKIN: No obvious rash, lesion, or ulcer.   ORDERS/RESULTS REVIEWED:   CBC  Recent Labs Lab 02/06/17 0827 02/06/17 2231 02/07/17 1107  WBC 5.2 4.8 5.8  HGB 14.3 11.4* 12.8  HCT 40.5 33.2* 38.7  PLT 378 251 306  MCV 97.3 99.0 99.5  MCH 34.2* 34.1* 33.0  MCHC 35.2 34.5 33.2  RDW 14.2 14.3 14.0  LYMPHSABS 1.3  --   --   MONOABS 0.4  --   --   EOSABS 0.1  --   --   BASOSABS 0.0  --   --    ------------------------------------------------------------------------------------------------------------------  Chemistries   Recent Labs Lab 02/06/17 0827 02/06/17 2231 02/07/17 1107 02/08/17 0425  NA 136 140 138 138  K 3.0* 3.1* 3.1* 3.4*  CL 102 115* 107 109  CO2 22 17* 21* 20*  GLUCOSE 88 66 68 89  BUN 9 7 <5* <5*  CREATININE 1.07* 0.85 0.98 0.90  CALCIUM 9.4 7.5* 8.4* 8.2*  MG 1.8  --  1.5*  --   AST 20 20  --   --   ALT 8* 8*  --   --   ALKPHOS 54 40  --   --   BILITOT 1.2 0.9  --   --    ------------------------------------------------------------------------------------------------------------------ estimated creatinine clearance  is 62.4 mL/min (by C-G formula based on SCr of 0.9 mg/dL). ------------------------------------------------------------------------------------------------------------------  Recent Labs  02/09/17 0804  TSH 80.000*    Cardiac Enzymes No results for input(s): CKMB, TROPONINI, MYOGLOBIN in the last 168 hours.  Invalid input(s): CK ------------------------------------------------------------------------------------------------------------------ Invalid input(s): POCBNP ---------------------------------------------------------------------------------------------------------------  RADIOLOGY: No results found.  EKG:  Orders placed or performed during the hospital encounter of 02/06/17  . ED EKG  . ED EKG  . EKG    ASSESSMENT AND PLAN:  Active Problems:   Dehydration   Depressive disorder  #1. Altered mental state , likely multifactorial, due to marijuana abuse, opiates, clinical hypothyroidism, possible underlying depression. Appreciate psychiatrist  input. The patient may have refused brain MRI to rule out metastasis.  #2. Intermittent nausea and vomiting, discussed with the patient that marijuana can sometimes induce nausea and vomiting him a counseled to stop, continue patient on  Protonix, Zofran,  KUB was negative for constipation, awaiting for barium swallowing, SLP testing, appreciate gastroenterologist input, is eating 100% of offered meals after initiation of Remeron, getting palliative care involved to discuss with the patient and patient's family. Treatment goals, since patient has been refusing numerous tests, I anticipate patient to be discharged within the next few days with outpatient follow-up #3. Failure to thrive adult, continue patient on Megace, Protonix, appreciate oncologist input, oral intake has improved with Remeron, awaiting for barium swallowing study #4. Hypokalemia, supplemented orally, magnesium level was low, supplemented intravenously and orally, follow  potassium and magnesium in the morning #5 hypothyroidism, now off methimazole, TSH still high at 80, follow clinically. Cause of the thyroid disease is unclear, but obviously methimazole is not needed at this time. #6  stage IV adenocarcinoma of the lung, patient was seen by oncologist during this admission,, it was noted that patient has not received any treatment for lung cancer since November 2017, she refused biopsy of the lymph node in the past. Palliative care is to discuss with patient and the family treatment goals. She was instructed by oncologist to follow up with Dr. Mike Gip on 02/09/2017 for further evaluation and consideration of chemotherapy. Brain MRI is refused, although bone scan 01/29/2017 did not show abnormalities in brain/skull.    Management plans discussed with the patient, gastroenterologist and they are in agreement.   DRUG ALLERGIES:  Allergies  Allergen Reactions  . No Known Allergies     CODE STATUS:     Code Status Orders        Start     Ordered   02/07/17 1026  Full code  Continuous     02/07/17 1026    Code Status History    Date Active Date Inactive Code Status Order ID Comments User Context   11/07/2016  6:18 AM 11/11/2016  2:39 PM Full Code [id number]  Harrie Foreman, MD Inpatient   06/21/2016 11:52 AM 06/22/2016  3:07 PM Full Code [id number]  [name], MD Inpatient      TOTAL TIME TAKING CARE OF THIS PATIENT: 40 minutes.    [name] M.D on 02/09/2017 at 4:28 PM  Between 7am to 6pm - Pager - [phone]  After 6pm go to [email or web address] - password [email or web address] [facility]  Office  [phone]  CC: Primary care physician; Donnie Coffin, MD

## 2017-02-09 NOTE — Progress Notes (Signed)
While rounding, [place] made initial visit to room 104. Pt presented somewhat anxious. Husband was bedside. Pt requested prayer for easing of the pain she experienced in her head and back, which was provided. CH is available for follow up as needed.    02/09/17 1100  Clinical Encounter Type  Visited With Patient;Patient and family together  Visit Type Initial;Spiritual support  Spiritual Encounters  Spiritual Needs Prayer

## 2017-02-10 ENCOUNTER — Inpatient Hospital Stay: Payer: Medicaid Other

## 2017-02-10 ENCOUNTER — Observation Stay: Payer: Medicaid Other

## 2017-02-10 ENCOUNTER — Inpatient Hospital Stay: Payer: Medicaid Other | Admitting: Hematology and Oncology

## 2017-02-10 DIAGNOSIS — E860 Dehydration: Secondary | ICD-10-CM | POA: Diagnosis not present

## 2017-02-10 DIAGNOSIS — R634 Abnormal weight loss: Secondary | ICD-10-CM | POA: Diagnosis not present

## 2017-02-10 DIAGNOSIS — R5383 Other fatigue: Secondary | ICD-10-CM | POA: Diagnosis not present

## 2017-02-10 DIAGNOSIS — Z79899 Other long term (current) drug therapy: Secondary | ICD-10-CM | POA: Diagnosis not present

## 2017-02-10 DIAGNOSIS — R627 Adult failure to thrive: Secondary | ICD-10-CM | POA: Diagnosis not present

## 2017-02-10 DIAGNOSIS — E039 Hypothyroidism, unspecified: Secondary | ICD-10-CM | POA: Diagnosis not present

## 2017-02-10 DIAGNOSIS — C799 Secondary malignant neoplasm of unspecified site: Secondary | ICD-10-CM | POA: Diagnosis not present

## 2017-02-10 DIAGNOSIS — R410 Disorientation, unspecified: Secondary | ICD-10-CM | POA: Diagnosis not present

## 2017-02-10 DIAGNOSIS — R531 Weakness: Secondary | ICD-10-CM | POA: Diagnosis not present

## 2017-02-10 DIAGNOSIS — Z7189 Other specified counseling: Secondary | ICD-10-CM | POA: Diagnosis not present

## 2017-02-10 DIAGNOSIS — C801 Malignant (primary) neoplasm, unspecified: Secondary | ICD-10-CM | POA: Diagnosis not present

## 2017-02-10 DIAGNOSIS — C3412 Malignant neoplasm of upper lobe, left bronchus or lung: Secondary | ICD-10-CM

## 2017-02-10 DIAGNOSIS — Z515 Encounter for palliative care: Secondary | ICD-10-CM | POA: Diagnosis not present

## 2017-02-10 LAB — MAGNESIUM: MAGNESIUM: 2.2 mg/dL (ref 1.7–2.4)

## 2017-02-10 LAB — POTASSIUM: Potassium: 4.8 mmol/L (ref 3.5–5.1)

## 2017-02-10 MED ORDER — LORAZEPAM 2 MG/ML IJ SOLN
0.5000 mg | Freq: Once | INTRAMUSCULAR | Status: AC
  Administered 2017-02-10: 0.5 mg via INTRAVENOUS
  Filled 2017-02-10: qty 1

## 2017-02-10 NOTE — Progress Notes (Signed)
[place] [facility] [place] NAME: Audrey Mcgee    MR#:  [id number]  DATE OF BIRTH:  1961-07-02  SUBJECTIVE:  CHIEF COMPLAINT:   Chief Complaint  Patient presents with  . Weakness   The patient is 56 year old African-American female with past medical history significant for history of lung cancer, hypertension, thyroid disease, COPD, who presents to the hospital with complaints of generalized weakness, no energy, nausea, no vomiting, poor oral intake. In emergency room, she was felt to be dehydrated and was admitted. Tests were negative for flu and lactate was normal. Patient denied any abdominal discomfort, although admited of constipation for 3-4 days. She had to use enema about 3 days ago to produce bowel movements, the patient is on opiates chronically at home. KUB in the hospital showed no significant abnormalities, including no constipation, ileus Patient complained of dysphagia, food getting stuck in lower neck area, solid food as well as liquids. She was very tube, gastroenterologist, upper GI series was recommended as well as swallowing evaluation. TSH came back high. Urine drug screen was positive for opiates as well as cannabinoids.  Patient Remains confused today, not able to answer simple questions. Now off methimazole. The patient was seen by psychiatrist , initiated on Remeron, oral intake remains poor. Palliative care met with patient's brother and the patient herself today, recommended palliative care. Follow up in the facility. Patient was seen by physical therapist, skilled nursing facility for rehabilitation was recommended  Review of Systems  Unable to perform ROS: Mental acuity    VITAL SIGNS: Blood pressure 132/81, pulse 80, temperature 97.6 F (36.4 C), temperature source Oral, resp. rate 16, height '5\' 4"'$  (1.626 m), weight 55.7 kg (122 lb 11.2 oz), last menstrual period 01/30/2008, SpO2 100 %.  PHYSICAL EXAMINATION:    GENERAL:  56 y.o.-year-old patient lying in the bed with no acute distress. Slow to respond, more alert today, dry oral mucosa, lips EYES: Pupils equal, round, reactive to light and accommodation. No scleral icterus. Extraocular muscles intact.  HEENT: Head atraumatic, normocephalic. Oropharynx and nasopharynx clear.  NECK:  Supple, no jugular venous distention. No thyroid enlargement, no tenderness.  LUNGS: Normal breath sounds bilaterally, no wheezing, rales,rhonchi or crepitation. No use of accessory muscles of respiration.  CARDIOVASCULAR: S1, S2 normal. No murmurs, rubs, or gallops.  ABDOMEN: Soft, nontender, nondistended. Bowel sounds present. No organomegaly or mass.  EXTREMITIES: No pedal edema, cyanosis, or clubbing.  NEUROLOGIC: Cranial nerves II through XII are grossly intact. Muscle strength 5/5 in all extremities. Sensation intact. Gait not checked.  PSYCHIATRIC: The patient is more alert today, disoriented, slow to respond SKIN: No obvious rash, lesion, or ulcer.   ORDERS/RESULTS REVIEWED:   CBC  Recent Labs Lab 02/06/17 0827 02/06/17 2231 02/07/17 1107  WBC 5.2 4.8 5.8  HGB 14.3 11.4* 12.8  HCT 40.5 33.2* 38.7  PLT 378 251 306  MCV 97.3 99.0 99.5  MCH 34.2* 34.1* 33.0  MCHC 35.2 34.5 33.2  RDW 14.2 14.3 14.0  LYMPHSABS 1.3  --   --   MONOABS 0.4  --   --   EOSABS 0.1  --   --   BASOSABS 0.0  --   --    ------------------------------------------------------------------------------------------------------------------  Chemistries   Recent Labs Lab 02/06/17 0827 02/06/17 2231 02/07/17 1107 02/08/17 0425 02/10/17 0548  NA 136 140 138 138  --   K 3.0* 3.1* 3.1* 3.4* 4.8  CL 102 115* 107 109  --  CO2 22 17* 21* 20*  --   GLUCOSE 88 66 68 89  --   BUN 9 7 <5* <5*  --   CREATININE 1.07* 0.85 0.98 0.90  --   CALCIUM 9.4 7.5* 8.4* 8.2*  --   MG 1.8  --  1.5*  --  2.2  AST 20 20  --   --   --   ALT 8* 8*  --   --   --   ALKPHOS 54 40  --   --   --    BILITOT 1.2 0.9  --   --   --    ------------------------------------------------------------------------------------------------------------------ estimated creatinine clearance is 62.4 mL/min (by C-G formula based on SCr of 0.9 mg/dL). ------------------------------------------------------------------------------------------------------------------  Recent Labs  02/09/17 0804  TSH 80.000*    Cardiac Enzymes No results for input(s): CKMB, TROPONINI, MYOGLOBIN in the last 168 hours.  Invalid input(s): CK ------------------------------------------------------------------------------------------------------------------ Invalid input(s): POCBNP ---------------------------------------------------------------------------------------------------------------  RADIOLOGY: Mr Jeri Cos Wo Contrast  Result Date: 02/10/2017 CLINICAL DATA:  Lung cancer, presenting with complaints of generalized weakness, nausea and vomiting and dehydration. EXAM: MRI HEAD WITHOUT AND WITH CONTRAST TECHNIQUE: Multiplanar, multiecho pulse sequences of the brain and surrounding structures were obtained without and with intravenous contrast. CONTRAST:  3m MULTIHANCE GADOBENATE DIMEGLUMINE 529 MG/ML IV SOLN COMPARISON:  CT head 02/06/2017.  PET scan 01/29/2017. FINDINGS: Brain: No evidence for acute infarction, hemorrhage, mass lesion, hydrocephalus, or extra-axial fluid. Advanced atrophy. Chronic microvascular ischemic change. Post infusion, no abnormal enhancement of the brain or meninges. There may be a small venous angioma RIGHT frontal cortex, image 44 series 14. Vascular: Normal flow voids. Skull and upper cervical spine: Normal marrow signal. Sinuses/Orbits: Negative. Other: Cervical lymph nodes, incompletely evaluated, hypermetabolic on CT concerning for metastatic disease. IMPRESSION: Atrophy and small vessel disease. No intracranial metastatic disease is evident. Electronically Signed   By: JStaci RighterM.D.   On:  02/10/2017 12:06    EKG:  Orders placed or performed during the hospital encounter of 02/06/17  . ED EKG  . ED EKG  . EKG    ASSESSMENT AND PLAN:  Active Problems:   Dehydration   Depressive disorder  #1. Altered mental state , possibly delirium versus underlying dementia, multifactorial, due to marijuana abuse, opiates, clinical hypothyroidism, possible underlying depression. Appreciate psychiatrist  input. The patient underwent brain MRI to rule out metastasis, no metastases were found, but atrophy.  #2. Intermittent nausea and vomiting, discussed with the patient that marijuana can sometimes induce nausea and vomiting ,  counseled to stop, continue patient on  Protonix, Zofran,  KUB was negative for constipation, no barium swallowing was performed, SLP recommendations are pending, appreciate gastroenterologist input,  palliative care met with the patient and patient's brother, palliative care follow up in skilled nursing facility was recommended. Not noted to have vomiting in the hospital, however oral intake remains low, may benefit from EGD, if agreeable #3. Failure to thrive adult, continue patient on Megace, Protonix, appreciate oncologist input, oral intake remains poor on Remeron, awaiting for barium swallowing study #4. Hypokalemia, supplemented orally, magnesium level was low, supplemented intravenously and orally, normal potassium and magnesium today #5 hypothyroidism, now off methimazole, TSH still high at 80, follow in am. Cause of the thyroid disease is unclear, but obviously methimazole is not needed at this time. #6  stage IV adenocarcinoma of the lung, patient was seen by oncologist during this admission,, it was noted that patient has not received any treatment for lung cancer since  November 2017, she refused biopsy of the lymph node in the past. Palliative care discussed with patient and the brother , that oncologist may not initiate chemotherapy if her oral intake remains  low . She was instructed by oncologist to follow up with Dr. Mike Gip on 02/09/2017 for further evaluation and consideration of chemotherapy. Brain MRI is negative for metastasis, but atrophy , bone scan 01/29/2017 did not show abnormalities in brain/skull.  #7. Generalized weakness, physical therapist evaluated patient and recommended skilled nursing facility placement for rehabilitation, getting social work was involved for placement  Management plans discussed with the patient, gastroenterologist and they are in agreement.   DRUG ALLERGIES:  Allergies  Allergen Reactions  . No Known Allergies     CODE STATUS:     Code Status Orders        Start     Ordered   02/07/17 1026  Full code  Continuous     02/07/17 1026    Code Status History    Date Active Date Inactive Code Status Order ID Comments User Context   11/07/2016  6:18 AM 11/11/2016  2:39 PM Full Code [id number]  Harrie Foreman, MD Inpatient   06/21/2016 11:52 AM 06/22/2016  3:07 PM Full Code [id number]  [name], MD Inpatient      TOTAL TIME TAKING CARE OF THIS PATIENT: 40 minutes.    [name] M.D on 02/10/2017 at 4:49 PM  Between 7am to 6pm - Pager - [phone]  After 6pm go to [email or web address] - password [email or web address]  Office  [phone]  CC: Primary care physician; Donnie Coffin, MD

## 2017-02-10 NOTE — Progress Notes (Signed)
Pt has agreed to have MRI of the head.  Made Dr. Ether Griffins aware and verbal order given for 0.5 mg IV of ativan to help pt relax due MRI.  Made Logan in MRI aware and will get MRI done this morning.  Clarise Cruz, RN

## 2017-02-10 NOTE — Progress Notes (Signed)
PT Cancellation Note  Patient Details Name: Tanijah Morais MRN: [id number] DOB: 05-Jan-1961   Cancelled Treatment:    Reason Eval/Treat Not Completed: Fatigue/lethargy limiting ability to participate (chart reviewed; pt sleeping soundly upon entrance and unable to be aroused, will reattempt when more alert. )   Orlene Och Student PT 02/10/2017, 1:44 PM

## 2017-02-10 NOTE — Care Management (Signed)
Admitted to this facility under observation status with the diagnosis of dehydration (lung cancer). Dr. Clide Deutscher is listed as primary care physician. Goes to the [facility] and sees Dr. Mike Gip the last 2 years. Brother is Vicente Serene [phone]).. Boyfriend is Mr. Boykin Reaper [phone]). Mr Boykin Reaper and Ms, [name] are the only 2 people in the home. Mr. Boykin Reaper works at [employer] during the week. States that there is usually someone in the home at all times.  [name] has continued to smoke until about a week ago. No Home Health No skilled facility. No home oxygen. Rolling walker in the home. Prescriptions are filled at [facility] on [address]. No falls. Decreased appetite x 1-2 weeks. Lost 7-8 pounds. Takes care of all basic needs herself, doesn't drive. Boyfriend will transport.  Shelbie Ammons RN MSN CCM Care Management

## 2017-02-10 NOTE — Progress Notes (Signed)
[facility] Hematology/Oncology Progress Note  Date of admission: 02/06/2017  Hospital day:  02/10/2017  Chief Complaint: Audrey Mcgee is a 56 y.o. female with stage IV adenocarcinoma of the lung whop was admitted with general weakness, dehydration, and failure to thrive.  Subjective: The patient's boyfriend states that since admission, she has gotten weaker.  She is more confused.  She has had some nausea, but no vomiting.  She is not eating.  She has not gotten out of bed.  She is currently sedated after a head MRI.  Social History: The patient is accompanied by her boyfriend, Audrey Mcgee, today.  Her brother is her medical power of attorney.  Allergies:  Allergies  Allergen Reactions  . No Known Allergies     Scheduled Medications: . docusate sodium  100 mg Oral BID  . enoxaparin (LOVENOX) injection  40 mg Subcutaneous Q24H  . feeding supplement  1 Container Oral BID BM  . feeding supplement (ENSURE ENLIVE)  237 mL Oral TID WC  . folic acid  1 mg Oral Daily  . losartan  100 mg Oral Daily  . magnesium oxide  400 mg Oral Daily  . mouth rinse  15 mL Mouth Rinse BID  . megestrol  400 mg Oral BID  . metoprolol tartrate  50 mg Oral BID  . morphine  15 mg Oral BID  . nystatin  5 mL Oral QID  . pantoprazole  40 mg Oral Daily  . potassium chloride  10 mEq Oral BID  . senna  1 tablet Oral Daily  . tiotropium  18 mcg Inhalation Daily    Review of Systems: Unable to assess as patient sedated s/p Ativan for head MRI.  Answers are per boyfriend. GENERAL: Profound fatigue.  Has not gotten out of bed.  No fevers.  Weight loss. PERFORMANCE STATUS (ECOG):  2 HEENT:  No visual changes, runny nose, sore throat, mouth sores or tenderness. Lungs: No shortness of breath or cough.  No hemoptysis. Cardiac:  No chest pain, palpitations, orthopnea, or PND. GI:  Poor oral intake.  No nausea, vomiting, diarrhea, constipation, melena or hematochezia. GU:  No urgency,  frequency, dysuria, or hematuria. Musculoskeletal:  No back pain.  No joint pain.  No muscle tenderness. Extremities:  No pain or swelling. Skin:  No rashes or skin changes. Neuro:  General weakness.  No headache, numbness or weakness, balance or coordination issues. Endocrine:  No diabetes, thyroid issues, hot flashes or night sweats. Psych:  No mood changes, depression or anxiety. Pain:  No focal pain. Review of systems:  All other systems reviewed and found to be negative.  Physical Exam: Blood pressure 132/81, pulse 80, temperature 97.6 F (36.4 C), temperature source Oral, resp. rate 16, height '5\' 4"'$  (1.626 m), weight 122 lb 11.2 oz (55.7 kg), last menstrual period 01/30/2008, SpO2 100 %.  GENERAL:  Chronically ill appearing woman sleeping in no acute distress. MENTAL STATUS: Sedated. HEAD:  Brown disheveled hair.  Normocephalic, atraumatic, face symmetric, no Cushingoid features. EYES:  Eyes closed. ENT:  Oropharynx clear without lesion.  Tongue normal. Mucous membranes dry.  RESPIRATORY:  Upper airway sound.  Clear to auscultation without rales, wheezes or rhonchi. CARDIOVASCULAR:  Regular rate and rhythm. ABDOMEN:  Soft, non-tender, with active bowel sounds, and no hepatosplenomegaly.  No masses. SKIN:  No rashes, ulcers or lesions. EXTREMITIES: Thin.  No edema, no skin discoloration or tenderness.  No palpable cords. LYMPH NODES: No palpable cervical, supraclavicular, axillary or inguinal adenopathy  NEUROLOGICAL: s/p Ativan for MRI sedation.  Unable to follow commands.  Moves all 4 extremities. PSYCH:  Appropriate.   Results for orders placed or performed during the hospital encounter of 02/06/17 (from the past 48 hour(s))  TSH     Status: Abnormal   Collection Time: 02/09/17  8:04 AM  Result Value Ref Range   TSH 80.000 (H) 0.350 - 4.500 uIU/mL    Comment: Performed by a 3rd Generation assay with a functional sensitivity of <=0.01 uIU/mL.  Potassium     Status: None    Collection Time: 02/10/17  5:48 AM  Result Value Ref Range   Potassium 4.8 3.5 - 5.1 mmol/L  Magnesium     Status: None   Collection Time: 02/10/17  5:48 AM  Result Value Ref Range   Magnesium 2.2 1.7 - 2.4 mg/dL   Mr Jeri Cos Wo Contrast  Result Date: 02/10/2017 CLINICAL DATA:  Lung cancer, presenting with complaints of generalized weakness, nausea and vomiting and dehydration. EXAM: MRI HEAD WITHOUT AND WITH CONTRAST TECHNIQUE: Multiplanar, multiecho pulse sequences of the brain and surrounding structures were obtained without and with intravenous contrast. CONTRAST:  37m MULTIHANCE GADOBENATE DIMEGLUMINE 529 MG/ML IV SOLN COMPARISON:  CT head 02/06/2017.  PET scan 01/29/2017. FINDINGS: Brain: No evidence for acute infarction, hemorrhage, mass lesion, hydrocephalus, or extra-axial fluid. Advanced atrophy. Chronic microvascular ischemic change. Post infusion, no abnormal enhancement of the brain or meninges. There may be a small venous angioma RIGHT frontal cortex, image 44 series 14. Vascular: Normal flow voids. Skull and upper cervical spine: Normal marrow signal. Sinuses/Orbits: Negative. Other: Cervical lymph nodes, incompletely evaluated, hypermetabolic on CT concerning for metastatic disease. IMPRESSION: Atrophy and small vessel disease. No intracranial metastatic disease is evident. Electronically Signed   By: JStaci RighterM.D.   On: 02/10/2017 12:06    Assessment:  CMarianne Golightlyis a 56y.o. female with metastatic poorly differentiated lung cancer.  She was diagnosed in 09/2014.  She received concurrent chemotherapy and radiation followed by maintenance [facility] (last 10/31/2016).  She has not received further chemotherapy secondary to missed appointments.  She underwent PET scan on 01/29/2017 for restaging.  Imaging revealed abnormal small volume hypermetabolic adenopathy in the neck, chest, and upper abdomen with probable involvement of the left adrenal gland, suspicious for  recurrent metastatic lung cancer.  There was stable sub solid nodularity in the left upper lobe with a new partially solid 7 mm nodule in the left lower lobe.  She declined cervical biopsy to confirm disease represented her lung cancer rather than a second malignancy (lymphoma).  She was seen int the medical oncology clinic on 02/06/2017 with nausea and vomiting.  She was weak and fatigued.  She had lost 14 pounds in 6 weeks.  She received IVF then left the clinic.  She returned to the ER with ongoing weakness.  Head MRI on 02/10/2017 revealed no evidence of metastatic disease.  She has a history of hyperthyroidism.  Thyroid function studies on 02/08/2017 revealed a TSH of 77 (high) and a free T4 of < 0.25 (low).  Plan: 1.  Oncology:  Patient has low volume metastatic disease.  No evidence of CNS disease.  Etiology of failure to thrive unlikely secondary to her malignancy.  Ideally, would like to confirm PET + adenopathy reveals non-small cell lung cancer (rather than lymphoma), but patient declines.  She has adamantly stated on several occasions that she wishes to continue chemotherapy.  At last meeting, she was agreeable to lymph  node biopsy if her nodes grew on therapy.  2.  Endocrinology:  Patient with a history of hyperthyroidism, but labs indicate severe hypothyroidism.  No evidence of myxedema coma (hypotension, bradycardia, hyponatremia, hypoglycemia).  Temperature slightly low.  She has manifested confusion at times and ongoing lethargy.  Consider phone consultation with endocrinology. Check cortisol.  3.  Neurology:  Confusion at times.  No obvious seizure activity.  Consider neurologic consultation.  4.  Nutrition:  Weight loss of greater than 10% in past 6 weeks.  Dietary consult.  Check B12, folate, B1.  Consider HIV testing.  5.  Disposition:  Code status remains full.  Per boyfriend, her brother has medical power of attorney (unclear if she has paperwork documenting).     Lequita Asal, MD  02/10/2017, 1:56 PM

## 2017-02-10 NOTE — Consult Note (Signed)
Consultation Note Date: 02/10/17  Patient Name: Audrey Mcgee  DOB: [birth year]  MRN: [id number]  Age / Sex: 56 y.o., female  PCP: Donnie Coffin, MD Referring Physician: [name], MD  Reason for Consultation: Establishing goals of care  HPI/Patient Profile: 56 y.o. female  with past medical history of hypertension, thyroid disease, COPD, and lung cancer admitted on 02/06/2017 with weakness and poor oral intake. Patient followed by Dr. Mike Gip outpatient for metastatic poorly differentiated adenocarcinoma of lung. PET scan on 01/29/17 revealed abnormal adenopathy in neck, chest, and upper abdomen suspicious for metastatic lung cancer. Patient defers biopsy. Last chemo November 2017. In ED, chest xray negative. CT scan head negative for acute infarct or abnormality. Patient with dehydration and failure to thrive. Palliative medicine consultation for goals of care.    Clinical Assessment and Goals of Care: I have reviewed medical records and discussed with Dr. Ether Griffins and Dr. Mike Gip. Initially met with patient and boyfriend at bedside in AM. Patient is alert but drowsy attempting to eat breakfast. She is able to answer some questions appropriately but with some confusion.   Introduced Palliative Medicine as specialized medical care for people living with serious illness. It focuses on providing relief from the symptoms and stress of a serious illness. The goal is to improve quality of life for both the patient and the family.  Boyfriend, Audrey Mcgee has lived with Ms. Giannattasio for 8 years. She has three daughters. Audrey Mcgee has watched her decline, especially the last two weeks with weakness and poor appetite. She is able to ambulate independently to the bathroom. He speaks of her increased confusion since she has been hospitalized.   Although confused, Ms. Marzec firmly tells me that she would want her brother,  Audrey Mcgee to be her Media planner. Boyfriend confirms this decision. She does not have a living will or POA paperwork. Advanced directives, concepts specific to code status, and artifical feeding and hydration were discussed. Ms. Norwood tells me she would want all medical interventions to prolong her life, including resuscitation, life support, and feeding tube. Educated on my recommendation for DNR/DNI with cancer and co-morbidities.     1530-Later this afternoon, met with brother, Audrey Mcgee and multiple family members at bedside. Ms. Ammon is lethargic but recently given ativan for MRI. Again discussed diagnoses, my fear of poor prognosis with decreased oral intake and cancer, advanced directives, and concepts specific to code status and artificial nutrition. Informed Audrey Mcgee that his sister would want him to make medical decisions if she did not have capacity. Strongly encouraged the family to consider and discuss these big decisions they are faced with in the future as patient continues to decline. Daughters at bedside become very tearful. Audrey Mcgee requests more time to process my recommendations.      Palliative Care services outpatient were explained and offered. Family agreeable if discharged tomorrow AM.   Questions and concerns were addressed. Provided emotional support.     SUMMARY OF RECOMMENDATIONS    Discussed and education on advanced directives, code status, and  my recommendation for DNR/DNI. Remains FULL code. Family needs more time to consider and discuss my conversation.   Continue current interventions. Patient and family want full scope treatment.   If discharged an AM, please have palliative services follow at home. RN CM notified.   PMT will continue to shadow chart and support patient and family through hospitalization.   Code Status/Advance Care Planning:  Full code   Symptom Management:   Per attending  Palliative Prophylaxis:   Aspiration, Delirium Protocol,  Frequent Pain Assessment and Oral Care  Additional Recommendations (Limitations, Scope, Preferences):  Full Scope Treatment  Psycho-social/Spiritual:   Desire for further Chaplaincy support:no  Additional Recommendations: Caregiving  Support/Resources and Education on Hospice  Prognosis:   Unable to determine  Discharge Planning: To Be Determined      Primary Diagnoses: Present on Admission: . Dehydration   I have reviewed the medical record, interviewed the patient and family, and examined the patient. The following aspects are pertinent.  Past Medical History:  Diagnosis Date  . COPD (chronic obstructive pulmonary disease) ([place])   . Hypertension   . Lung cancer ([place])   . Thyroid disease    Social History   Social History  . Marital status: Single    Spouse name: N/A  . Number of children: N/A  . Years of education: N/A   Social History Main Topics  . Smoking status: Current Every Day Smoker    Years: 15.00    Types: Cigarettes  . Smokeless tobacco: Never Used  . Alcohol use 1.2 oz/week    2 Cans of beer per week  . Drug use: No  . Sexual activity: Not Asked   Other Topics Concern  . None   Social History Narrative  . None   Family History  Problem Relation Age of Onset  . Hypertension Father   . Cancer Father   . Diabetes Father   . Cancer Maternal Aunt    Scheduled Meds: . docusate sodium  100 mg Oral BID  . enoxaparin (LOVENOX) injection  40 mg Subcutaneous Q24H  . feeding supplement  1 Container Oral BID BM  . feeding supplement (ENSURE ENLIVE)  237 mL Oral TID WC  . folic acid  1 mg Oral Daily  . levothyroxine  50 mcg Oral QAC breakfast  . losartan  100 mg Oral Daily  . magnesium oxide  400 mg Oral Daily  . mouth rinse  15 mL Mouth Rinse BID  . megestrol  400 mg Oral BID  . metoprolol tartrate  50 mg Oral BID  . morphine  15 mg Oral BID  . nystatin  5 mL Oral QID  . pantoprazole  40 mg Oral Daily  . senna  1 tablet Oral Daily  .  tiotropium  18 mcg Inhalation Daily   Continuous Infusions: . sodium chloride 50 mL/hr at 02/11/17 0617   PRN Meds:.acetaminophen **OR** acetaminophen, benzonatate, lidocaine, ondansetron **OR** ondansetron (ZOFRAN) IV, oxyCODONE Medications Prior to Admission:  Prior to Admission medications   Medication Sig Start Date End Date Taking? Authorizing Provider  dexamethasone (DECADRON) 4 MG tablet Take 65m BID the day before and day after chemotherapy. Patient taking differently: Take 4 mg by mouth 2 (two) times daily. Take 453mBID the day before and day after chemotherapy. 10/31/16  Yes MeLequita AsalMD  folic acid (FOLVITE) 80914CG tablet Take 800 mcg by mouth daily.    Yes Historical Provider, MD  losartan (COZAAR) 100 MG tablet Take 1 tablet (  100 mg total) by mouth daily. 06/22/16  Yes [name], MD  magnesium oxide (MAG-OX) 400 MG tablet Take 1 tablet (400 mg total) by mouth daily. 04/17/16  Yes Lequita Asal, MD  megestrol (MEGACE) 40 MG/ML suspension Take 10 mLs (400 mg total) by mouth 2 (two) times daily. 02/02/17  Yes Lequita Asal, MD  methimazole (TAPAZOLE) 5 MG tablet take 1 tablet by mouth twice a day for OVERACTIVE THYROID 08/08/16  Yes Historical Provider, MD  metoprolol (LOPRESSOR) 50 MG tablet take 1 tablet by mouth twice a day NOTE DOSE INCREASE 08/07/16  Yes Historical Provider, MD  morphine (MS CONTIN) 15 MG 12 hr tablet Take 1 tablet (15 mg total) by mouth 2 (two) times daily. 02/02/17  Yes Lequita Asal, MD  omeprazole (PRILOSEC) 20 MG capsule Take 1 capsule (20 mg total) by mouth daily. 06/20/16  Yes Lequita Asal, MD  ondansetron (ZOFRAN) 8 MG tablet Take 1 tablet (8 mg total) by mouth 2 (two) times daily as needed for nausea or vomiting. 08/22/16  Yes Lequita Asal, MD  Oxycodone HCl 10 MG TABS Take 1 tablet (10 mg total) by mouth every 4 (four) hours as needed (pain). 02/02/17  Yes Lequita Asal, MD  potassium chloride (K-DUR) 10 MEQ  tablet Take 1 tablet (10 mEq total) by mouth 2 (two) times daily. For 3 days 02/06/17  Yes Lequita Asal, MD  tiotropium (SPIRIVA) 18 MCG inhalation capsule Place 1 capsule (18 mcg total) into inhaler and inhale daily. 06/22/16  Yes [name], MD  docusate sodium (COLACE) 100 MG capsule Take 100 mg by mouth 2 (two) times daily as needed for mild constipation.    Historical Provider, MD  lidocaine (XYLOCAINE) 2 % solution Use as directed 10 mLs in the mouth or throat every 4 (four) hours as needed for mouth pain.    Historical Provider, MD  predniSONE (DELTASONE) 20 MG tablet Take 2 tablets (40 mg total) by mouth daily with breakfast. Patient not taking: Reported on 02/07/2017 11/11/16   Hillary Bow, MD   Allergies  Allergen Reactions  . No Known Allergies    Review of Systems  Unable to perform ROS: Acuity of condition   Physical Exam  Constitutional: She is easily aroused. She appears ill.  Cardiovascular: Regular rhythm.   Pulmonary/Chest: Effort normal. She has decreased breath sounds.  Abdominal: Normal appearance.  Neurological: She is alert and easily aroused. She is disoriented.  Skin: Skin is warm and dry.  Psychiatric: Her speech is delayed. Cognition and memory are impaired. She is inattentive.  Nursing note and vitals reviewed.   Vital Signs: BP 93/68   Pulse 96   Temp 97.8 F (36.6 C)   Resp 20   Ht 5' 4"  (1.626 m)   Wt 55.7 kg (122 lb 11.2 oz) Comment: bed scale  LMP 01/30/2008 Comment: 9 yrs ago  SpO2 98%   BMI 21.06 kg/m  Pain Assessment: PAINAD   Pain Score: 0-No pain (Given pre-MRI for sedation)   SpO2: SpO2: 98 % O2 Device:SpO2: 98 % O2 Flow Rate: .   IO: Intake/output summary:   Intake/Output Summary (Last 24 hours) at 02/11/17 0832 Last data filed at 02/11/17 0622  Gross per 24 hour  Intake           504.17 ml  Output              900 ml  Net          -  395.83 ml    LBM: Last BM Date: 02/07/17 Baseline Weight: Weight: 49.9 kg  (110 lb) Most recent weight: Weight: 55.7 kg (122 lb 11.2 oz) (bed scale)     Palliative Assessment/Data: PPS 40%   Flowsheet Rows   Flowsheet Row Most Recent Value  Intake Tab  Referral Department  Hospitalist  Unit at Time of Referral  Oncology Unit  Palliative Care Primary Diagnosis  Cancer  Date Notified  02/08/17  Palliative Care Type  New Palliative care  Reason for referral  Clarify Goals of Care  Date of Admission  02/06/17  Date first seen by Palliative Care  02/10/17  # of days IP prior to Palliative referral  2  Clinical Assessment  Palliative Performance Scale Score  40%  Psychosocial & Spiritual Assessment  Palliative Care Outcomes  Patient/Family meeting held?  Yes  Who was at the meeting?  patient, boyfriend, brother  Palliative Care Outcomes  Provided psychosocial or spiritual support, Clarified goals of care, ACP counseling assistance, Linked to palliative care logitudinal support, Counseled regarding hospice      Time In/Out: 0915-1015, 1530-1620 Time Total: 110 min   Greater than 50%  of this time was spent counseling and coordinating care related to the above assessment and plan.  Signed by:  Ihor Dow, FNP-C Palliative Medicine Team  Phone: [phone] Fax: [phone]   Please contact Palliative Medicine Team phone at [phone] for questions and concerns.  For individual provider: See Shea Evans

## 2017-02-11 DIAGNOSIS — C801 Malignant (primary) neoplasm, unspecified: Secondary | ICD-10-CM

## 2017-02-11 DIAGNOSIS — Z7901 Long term (current) use of anticoagulants: Secondary | ICD-10-CM | POA: Diagnosis not present

## 2017-02-11 DIAGNOSIS — Z515 Encounter for palliative care: Secondary | ICD-10-CM | POA: Diagnosis present

## 2017-02-11 DIAGNOSIS — G9341 Metabolic encephalopathy: Secondary | ICD-10-CM | POA: Diagnosis present

## 2017-02-11 DIAGNOSIS — J449 Chronic obstructive pulmonary disease, unspecified: Secondary | ICD-10-CM | POA: Diagnosis present

## 2017-02-11 DIAGNOSIS — E0590 Thyrotoxicosis, unspecified without thyrotoxic crisis or storm: Secondary | ICD-10-CM | POA: Diagnosis present

## 2017-02-11 DIAGNOSIS — F329 Major depressive disorder, single episode, unspecified: Secondary | ICD-10-CM | POA: Diagnosis present

## 2017-02-11 DIAGNOSIS — E039 Hypothyroidism, unspecified: Secondary | ICD-10-CM | POA: Diagnosis present

## 2017-02-11 DIAGNOSIS — R627 Adult failure to thrive: Secondary | ICD-10-CM | POA: Diagnosis not present

## 2017-02-11 DIAGNOSIS — F05 Delirium due to known physiological condition: Secondary | ICD-10-CM | POA: Diagnosis not present

## 2017-02-11 DIAGNOSIS — R1310 Dysphagia, unspecified: Secondary | ICD-10-CM | POA: Diagnosis present

## 2017-02-11 DIAGNOSIS — E876 Hypokalemia: Secondary | ICD-10-CM | POA: Diagnosis present

## 2017-02-11 DIAGNOSIS — R531 Weakness: Secondary | ICD-10-CM | POA: Diagnosis not present

## 2017-02-11 DIAGNOSIS — R634 Abnormal weight loss: Secondary | ICD-10-CM | POA: Diagnosis not present

## 2017-02-11 DIAGNOSIS — Z79891 Long term (current) use of opiate analgesic: Secondary | ICD-10-CM | POA: Diagnosis not present

## 2017-02-11 DIAGNOSIS — R4182 Altered mental status, unspecified: Secondary | ICD-10-CM | POA: Diagnosis not present

## 2017-02-11 DIAGNOSIS — E43 Unspecified severe protein-calorie malnutrition: Secondary | ICD-10-CM | POA: Diagnosis present

## 2017-02-11 DIAGNOSIS — R443 Hallucinations, unspecified: Secondary | ICD-10-CM | POA: Diagnosis not present

## 2017-02-11 DIAGNOSIS — E519 Thiamine deficiency, unspecified: Secondary | ICD-10-CM | POA: Diagnosis present

## 2017-02-11 DIAGNOSIS — Z7189 Other specified counseling: Secondary | ICD-10-CM | POA: Diagnosis not present

## 2017-02-11 DIAGNOSIS — E872 Acidosis: Secondary | ICD-10-CM | POA: Diagnosis present

## 2017-02-11 DIAGNOSIS — Z79899 Other long term (current) drug therapy: Secondary | ICD-10-CM | POA: Diagnosis not present

## 2017-02-11 DIAGNOSIS — I10 Essential (primary) hypertension: Secondary | ICD-10-CM | POA: Diagnosis present

## 2017-02-11 DIAGNOSIS — E860 Dehydration: Secondary | ICD-10-CM | POA: Diagnosis not present

## 2017-02-11 DIAGNOSIS — C3490 Malignant neoplasm of unspecified part of unspecified bronchus or lung: Secondary | ICD-10-CM | POA: Diagnosis present

## 2017-02-11 DIAGNOSIS — R410 Disorientation, unspecified: Secondary | ICD-10-CM | POA: Diagnosis not present

## 2017-02-11 DIAGNOSIS — C3411 Malignant neoplasm of upper lobe, right bronchus or lung: Secondary | ICD-10-CM | POA: Diagnosis not present

## 2017-02-11 DIAGNOSIS — F17210 Nicotine dependence, cigarettes, uncomplicated: Secondary | ICD-10-CM | POA: Diagnosis present

## 2017-02-11 DIAGNOSIS — R599 Enlarged lymph nodes, unspecified: Secondary | ICD-10-CM | POA: Diagnosis not present

## 2017-02-11 DIAGNOSIS — Z6821 Body mass index (BMI) 21.0-21.9, adult: Secondary | ICD-10-CM | POA: Diagnosis not present

## 2017-02-11 DIAGNOSIS — F1210 Cannabis abuse, uncomplicated: Secondary | ICD-10-CM | POA: Diagnosis present

## 2017-02-11 DIAGNOSIS — E279 Disorder of adrenal gland, unspecified: Secondary | ICD-10-CM | POA: Diagnosis not present

## 2017-02-11 LAB — BASIC METABOLIC PANEL
Anion gap: 9 (ref 5–15)
BUN: 9 mg/dL (ref 6–20)
CO2: 19 mmol/L — ABNORMAL LOW (ref 22–32)
Calcium: 8.6 mg/dL — ABNORMAL LOW (ref 8.9–10.3)
Chloride: 107 mmol/L (ref 101–111)
Creatinine, Ser: 1.53 mg/dL — ABNORMAL HIGH (ref 0.44–1.00)
GFR calc Af Amer: 44 mL/min — ABNORMAL LOW (ref >60)
GFR calc non Af Amer: 38 mL/min — ABNORMAL LOW (ref >60)
Glucose, Bld: 92 mg/dL (ref 65–99)
Potassium: 4.2 mmol/L (ref 3.5–5.1)
Sodium: 135 mmol/L (ref 135–145)

## 2017-02-11 LAB — TSH: TSH: 107 u[IU]/mL — AB (ref 0.350–4.500)

## 2017-02-11 LAB — T4, FREE: Free T4: <0.25 ng/dL — ABNORMAL LOW (ref 0.61–1.12)

## 2017-02-11 LAB — GLUCOSE, CAPILLARY: GLUCOSE-CAPILLARY: 96 mg/dL (ref 65–99)

## 2017-02-11 LAB — VITAMIN B12: Vitamin B-12: 2512 pg/mL — ABNORMAL HIGH (ref 180–914)

## 2017-02-11 LAB — CORTISOL: Cortisol, Plasma: 30.4 ug/dL

## 2017-02-11 LAB — FOLATE: Folate: 18.2 ng/mL (ref >5.9)

## 2017-02-11 MED ORDER — SODIUM CHLORIDE 0.9 % IV BOLUS (SEPSIS): 1000.0000 mL | INTRAVENOUS | Status: DC | PRN

## 2017-02-11 MED ORDER — LEVOTHYROXINE SODIUM 50 MCG PO TABS
50.0000 ug | ORAL_TABLET | Freq: Every day | ORAL | Status: DC
  Administered 2017-02-11 – 2017-02-15 (×5): 50 ug via ORAL
  Filled 2017-02-11 (×6): qty 1

## 2017-02-11 MED ORDER — LEVOTHYROXINE SODIUM 50 MCG PO TABS: 50.0000 ug | ORAL_TABLET | Freq: Every day | ORAL | Status: DC

## 2017-02-11 MED ORDER — SODIUM CHLORIDE 0.9 % IV SOLN
INTRAVENOUS | Status: DC
  Administered 2017-02-11 (×3): via INTRAVENOUS

## 2017-02-11 MED ORDER — OXYCODONE HCL 5 MG PO TABS
5.0000 mg | ORAL_TABLET | ORAL | Status: DC | PRN
  Administered 2017-02-16: 5 mg via ORAL
  Filled 2017-02-11 (×2): qty 1

## 2017-02-11 MED ORDER — SODIUM CHLORIDE 0.9 % IV BOLUS (SEPSIS)
500.0000 mL | Freq: Once | INTRAVENOUS | Status: AC
  Administered 2017-02-11: 500 mL via INTRAVENOUS

## 2017-02-11 MED ORDER — NALOXONE HCL 0.4 MG/ML IJ SOLN
0.4000 mg | Freq: Once | INTRAMUSCULAR | Status: DC
  Filled 2017-02-11: qty 1

## 2017-02-11 NOTE — Progress Notes (Signed)
[facility] [facility] [place] NAME: Audrey Mcgee    MR#:  [id number]  DATE OF BIRTH:  1961/09/30  SUBJECTIVE:  CHIEF COMPLAINT:   Chief Complaint  Patient presents with  . Weakness   The patient is 56 year old African-American female with past medical history significant for history of lung cancer, hypertension, thyroid disease, COPD, who presents to the hospital with complaints of generalized weakness, no energy, nausea, no vomiting, poor oral intake. In emergency room, she was felt to be dehydrated and was admitted. Tests were negative for flu and lactate was normal. Patient denied any abdominal discomfort, although admited of constipation for 3-4 days. She had to use enema about 3 days ago to produce bowel movements, the patient is on opiates chronically at home. KUB in the hospital showed no significant abnormalities, including no constipation, ileus Patient complained of dysphagia, food getting stuck in lower neck area, solid food as well as liquids. She was very tube, gastroenterologist, upper GI series was recommended as well as swallowing evaluation. TSH came back high. Urine drug screen was positive for opiates as well as cannabinoids.  Patient Remains Somnolent today, however, able to answer simple questions. Now off methimazole. But  TSH remains very high, even higher than before. Patient was initiated on Synthroid. The patient was seen by psychiatrist , initiated on Remeron, oral intake remains poor,. Palliative care met with patient's brother and the patient herself yesterday, recommended palliative care. Follow up in the facility. Patient was seen by physical therapist, skilled nursing facility for rehabilitation was recommended No new changes today, patient is not able to provide review of systems, but patient's patient's eldest daughter, who is concerned, the patient is declining physically as well as mentally, she is concerned that patient  may need hospice home, if no significant improvement in the nearest future. Review of Systems  Unable to perform ROS: Mental acuity    VITAL SIGNS: Blood pressure 101/80, pulse 88, temperature 98.3 F (36.8 C), temperature source Axillary, resp. rate 20, height _0  (1.626 m), weight 55.7 kg (122 lb 11.2 oz), last menstrual period 01/30/2008, SpO2 99 %.  PHYSICAL EXAMINATION:   GENERAL:  56 y.o.-year-old patient lying in the bed with no acute distress. Slow to respond, comfortable, somnolent EYES: Pupils equal, round, reactive to light and accommodation. No scleral icterus. Extraocular muscles intact.  HEENT: Head atraumatic, normocephalic. Oropharynx and nasopharynx clear.  NECK:  Supple, no jugular venous distention. No thyroid enlargement, no tenderness.  LUNGS: Normal breath sounds bilaterally, no wheezing, rales,rhonchi or crepitation. No use of accessory muscles of respiration.  CARDIOVASCULAR: S1, S2 normal. No murmurs, rubs, or gallops.  ABDOMEN: Soft, nontender, nondistended. Bowel sounds present. No organomegaly or mass.  EXTREMITIES: No pedal edema, cyanosis, or clubbing.  NEUROLOGIC: Cranial nerves II through XII are grossly intact. Muscle strength 5/5 in all extremities. Sensation intact. Gait not checked.  PSYCHIATRIC: The patient remains very somnolent, slow to respond SKIN: No obvious rash, lesion, or ulcer.   ORDERS/RESULTS REVIEWED:   CBC  Recent Labs Lab 02/06/17 0827 02/06/17 2231 02/07/17 1107  WBC 5.2 4.8 5.8  HGB 14.3 11.4* 12.8  HCT 40.5 33.2* 38.7  PLT 378 251 306  MCV 97.3 99.0 99.5  MCH 34.2* 34.1* 33.0  MCHC 35.2 34.5 33.2  RDW 14.2 14.3 14.0  LYMPHSABS 1.3  --   --   MONOABS 0.4  --   --   EOSABS 0.1  --   --  BASOSABS 0.0  --   --    ------------------------------------------------------------------------------------------------------------------  Chemistries   Recent Labs Lab 02/06/17 0827 02/06/17 2231 02/07/17 1107 02/08/17 0425  02/10/17 0548 02/11/17 0845  NA 136 140 138 138  --  135  K 3.0* 3.1* 3.1* 3.4* 4.8 4.2  CL 102 115* 107 109  --  107  CO2 22 17* 21* 20*  --  19*  GLUCOSE 88 66 68 89  --  92  BUN 9 7 <5* <5*  --  9  CREATININE 1.07* 0.85 0.98 0.90  --  1.53*  CALCIUM 9.4 7.5* 8.4* 8.2*  --  8.6*  MG 1.8  --  1.5*  --  2.2  --   AST 20 20  --   --   --   --   ALT 8* 8*  --   --   --   --   ALKPHOS 54 40  --   --   --   --   BILITOT 1.2 0.9  --   --   --   --    ------------------------------------------------------------------------------------------------------------------ estimated creatinine clearance is 36.7 mL/min (by C-G formula based on SCr of 1.53 mg/dL (H)). ------------------------------------------------------------------------------------------------------------------  Recent Labs  02/11/17 0358  TSH 107.000*    Cardiac Enzymes No results for input(s): CKMB, TROPONINI, MYOGLOBIN in the last 168 hours.  Invalid input(s): CK ------------------------------------------------------------------------------------------------------------------ Invalid input(s): POCBNP ---------------------------------------------------------------------------------------------------------------  RADIOLOGY: Mr Jeri Cos Wo Contrast  Result Date: 02/10/2017 CLINICAL DATA:  Lung cancer, presenting with complaints of generalized weakness, nausea and vomiting and dehydration. EXAM: MRI HEAD WITHOUT AND WITH CONTRAST TECHNIQUE: Multiplanar, multiecho pulse sequences of the brain and surrounding structures were obtained without and with intravenous contrast. CONTRAST:  3m MULTIHANCE GADOBENATE DIMEGLUMINE 529 MG/ML IV SOLN COMPARISON:  CT head 02/06/2017.  PET scan 01/29/2017. FINDINGS: Brain: No evidence for acute infarction, hemorrhage, mass lesion, hydrocephalus, or extra-axial fluid. Advanced atrophy. Chronic microvascular ischemic change. Post infusion, no abnormal enhancement of the brain or meninges. There  may be a small venous angioma RIGHT frontal cortex, image 44 series 14. Vascular: Normal flow voids. Skull and upper cervical spine: Normal marrow signal. Sinuses/Orbits: Negative. Other: Cervical lymph nodes, incompletely evaluated, hypermetabolic on CT concerning for metastatic disease. IMPRESSION: Atrophy and small vessel disease. No intracranial metastatic disease is evident. Electronically Signed   By: JStaci RighterM.D.   On: 02/10/2017 12:06    EKG:  Orders placed or performed during the hospital encounter of 02/06/17  . ED EKG  . ED EKG  . EKG    ASSESSMENT AND PLAN:  Active Problems:   Dehydration   Depressive disorder   Hypothyroidism   [place]   Palliative care by specialist   Goals of care, counseling/discussion   DNR (do not resuscitate) discussion  #1. Altered mental state , concerning for hypothyroid coma, patient on Synthroid, following mental status closely. Symptoms are likely multifactorial, due to marijuana abuse, opiates, clinical hypothyroidism, possible underlying depression. Appreciate psychiatrist  input. The patient underwent brain MRI to rule out metastasis, no metastases were found, but atrophy.  #2. Intermittent nausea and vomiting,  continue patient on  Protonix, Zofran,  KUB was negative for constipation, no barium swallowing was performed,  appreciate gastroenterologist input,  palliative care met with the patient and patient's brother, POA , her medical care, palliative care follow up in skilled nursing facility was recommended versus hospice home placement. Not noted to have vomiting in the hospital, however oral intake remains minimal.  #  3. Failure to thrive adult, continue patient on Megace, Protonix, appreciate oncologist input, oral intake remains poor on Remeron, no barium swallowing study was performed so far #4. Hypokalemia, supplemented orally, magnesium level was low, supplemented intravenously and orally, normal potassium and magnesium  today #5 hypothyroidism, now off methimazole, TSH still high at 107, free T4 is low at less than 0.25, patient has clinical hypothyroidism, initiate her on Synthroid, follow clinically. Cause of the thyroid disease is unclear, but obviously methimazole is not needed at this time. #6  stage IV adenocarcinoma of the lung, patient was seen by oncologist during this admission,, it was noted that patient has not received any treatment for lung cancer since November 2017, she refused biopsy of the lymph node in the past. Dr. Mike Gip saw patient in consultation today, she did not feel that is failure to thrive, etiology is related to her malignancy.  Palliative care discussed with patient and the brother , that oncologist may not initiate chemotherapy if her oral intake remains low . She was instructed by oncologist to follow up with Dr. Mike Gip on 02/09/2017 for further evaluation and consideration of chemotherapy. Brain MRI is negative for metastasis, but atrophy , bone scan 01/29/2017 did not show abnormalities in brain/skull.  #7. Generalized weakness, physical therapist evaluated patient and recommended skilled nursing facility placement for rehabilitation, discharge planning is underway, patient may not be able to participate in skilled nursing facility. Physical therapy requirements, if her oral intake does not improve, she may benefit from hospice home placement    Management plans discussed with the patient, her daughter, all questions were answered  DRUG ALLERGIES:  Allergies  Allergen Reactions  . No Known Allergies     CODE STATUS:     Code Status Orders        Start     Ordered   02/07/17 1026  Full code  Continuous     02/07/17 1026    Code Status History    Date Active Date Inactive Code Status Order ID Comments User Context   11/07/2016  6:18 AM 11/11/2016  2:39 PM Full Code [id number]  Harrie Foreman, MD Inpatient   06/21/2016 11:52 AM 06/22/2016  3:07 PM Full Code [id number]   [name], MD Inpatient      TOTAL TIME TAKING CARE OF THIS PATIENT: 40 minutes.  Discussed with patient's daughter, updated, all questions were answered  Elane Peabody M.D on 02/11/2017 at 3:13 PM  Between 7am to 6pm - Pager - [phone]  After 6pm go to [email or web address] - password [email or web address] [facility]  Office  [phone]  CC: Primary care physician; Donnie Coffin, MD

## 2017-02-11 NOTE — Clinical Social Work Note (Signed)
CSW received consult for New SNF. PT eval is pending. Pt is being followed by Palliative Care. Disposition is pending. Full assessment to follow. CSW will continue to follow.   Darden Dates, MSW, LCSW  Clinical Social Worker  [phone]

## 2017-02-11 NOTE — Progress Notes (Signed)
Daily Progress Note   Patient Name: Audrey Mcgee       Date: 02/11/2017 DOB: October 05, 1961  Age: 56 y.o. MRN#: [id number] Attending Physician: [name], MD Primary Care Physician: Donnie Coffin, MD Admit Date: 02/06/2017  Reason for Consultation/Follow-up: Establishing goals of care  Subjective: This afternoon, patient is very drowsy. Easily wakes to voice. Answers few questions.   Brother Audrey Mcgee) and two daughters at bedside. We again discussed diagnoses, poor prognosis, and concepts specific to code status/artificial nutrition. Educated on my recommendation for DNR/DNI with underlying cancer and medical co-morbidities. Also, poor quality of life if she did survive resuscitation. Daughters become very tearful during our conversation. One speaks of promising her mother she would never place her in a nursing facility. The other speaks of knowing her mother would not want to be bed bound with tubes the rest of her life. After hearing my recommendation, Audrey Mcgee states he would not want her to be resuscitated or on life support but cannot make this decision without the daughters agreeing. Daughters want to further discuss code status with boyfriend this evening.  Explained my concern with high risk for decompensation with multiple medical problems and poor nutritional status. Encouraged family to make decisions sooner than later. Family would like for her to remain a FULL code and will further discuss tonight.   Briefly discussed comfort measures and hospice services that could be available to them at home. Answered questions and concerns. Provided emotional support.    Length of Stay: 0  Current Medications: Scheduled Meds:  . docusate sodium  100 mg Oral BID  . enoxaparin (LOVENOX)  injection  40 mg Subcutaneous Q24H  . feeding supplement  1 Container Oral BID BM  . feeding supplement (ENSURE ENLIVE)  237 mL Oral TID WC  . folic acid  1 mg Oral Daily  . levothyroxine  50 mcg Oral QAC breakfast  . losartan  100 mg Oral Daily  . magnesium oxide  400 mg Oral Daily  . mouth rinse  15 mL Mouth Rinse BID  . megestrol  400 mg Oral BID  . metoprolol tartrate  50 mg Oral BID  . naLOXone (NARCAN)  injection  0.4 mg Intravenous Once  . nystatin  5 mL Oral QID  . pantoprazole  40 mg Oral Daily  . senna  1 tablet Oral Daily  . tiotropium  18 mcg Inhalation Daily    Continuous Infusions: . sodium chloride 100 mL/hr at 02/11/17 1610    PRN Meds: acetaminophen **OR** acetaminophen, benzonatate, lidocaine, ondansetron **OR** ondansetron (ZOFRAN) IV, oxyCODONE, sodium chloride  Physical Exam  Constitutional: She is easily aroused. She appears ill.  HENT:  Head: Normocephalic and atraumatic.  Cardiovascular: An irregularly irregular rhythm present.  Pulmonary/Chest: Effort normal. No accessory muscle usage. No tachypnea. No respiratory distress. She has rhonchi.  Abdominal: Bowel sounds are normal. She exhibits distension. There is no tenderness.  Neurological: She is easily aroused. She is disoriented.  Skin: Skin is warm and dry.  Psychiatric: Her speech is delayed. Cognition and memory are impaired. She is inattentive.  Nursing note and vitals reviewed.          Vital Signs: BP 101/80 (BP Location: Left Arm)   Pulse 88   Temp 98.3 F (36.8 C) (Axillary)   Resp 20   Ht '5\' 4"'$  (1.626 m)   Wt 55.7 kg (122 lb 11.2 oz) Comment: bed scale  LMP 01/30/2008 Comment: 9 yrs ago  SpO2 99%   BMI 21.06 kg/m  SpO2: SpO2: 99 % O2 Device: O2 Device: Not Delivered O2 Flow Rate:    Intake/output summary:   Intake/Output Summary (Last 24 hours) at 02/11/17 1714 Last data filed at 02/11/17 1414  Gross per 24 hour  Intake            897.5 ml  Output              900 ml  Net              -2.5 ml   LBM: Last BM Date: 02/07/17 Baseline Weight: Weight: 49.9 kg (110 lb) Most recent weight: Weight: 55.7 kg (122 lb 11.2 oz) (bed scale)       Palliative Assessment/Data: PPS 20%   Flowsheet Rows   Flowsheet Row Most Recent Value  Intake Tab  Referral Department  Hospitalist  Unit at Time of Referral  Oncology Unit  Palliative Care Primary Diagnosis  Cancer  Date Notified  02/08/17  Palliative Care Type  New Palliative care  Reason for referral  Clarify Goals of Care  Date of Admission  02/06/17  Date first seen by Palliative Care  02/10/17  # of days IP prior to Palliative referral  2  Clinical Assessment  Palliative Performance Scale Score  20%  Psychosocial & Spiritual Assessment  Palliative Care Outcomes  Patient/Family meeting held?  Yes  Who was at the meeting?  patient, brother, two daughters  Palliative Care Outcomes  Clarified goals of care, Provided psychosocial or spiritual support, Counseled regarding hospice, ACP counseling assistance, Provided end of life care assistance      Patient Active Problem List   Diagnosis Date Noted  . Hypothyroidism 02/11/2017  . Malignancy ([place])   . Palliative care by specialist   . Goals of care, counseling/discussion   . DNR (do not resuscitate) discussion   . Depressive disorder   . Dehydration 02/07/2017  . Weight loss 02/07/2017  . Encounter for antineoplastic chemotherapy 02/01/2017  . 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 Sepsis ([place]) 11/07/2016  . Protein-calorie malnutrition, severe 11/07/2016  . Acute respiratory distress 06/21/2016  . COPD exacerbation ([place]) 06/21/2016  . Sinus tachycardia 06/21/2016  . Recurrent aspiration bronchitis/pneumonia ([place]) 06/21/2016  . Dysphagia 06/21/2016  . Nausea and vomiting 06/21/2016  . Dental abscess 05/10/2016  . Swallowing difficulty 03/28/2016  .  Hypomagnesemia 02/29/2016  . Hoarseness 02/18/2016  . Cancer related pain 07/08/2015  .  Adenocarcinoma, lung ([place]) 03/26/2015  . 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 Ear ache 10/03/2014  . Abscess of buccal cavity 10/03/2014  . Lump in neck 10/03/2014  . Laryngeal pain 10/03/2014    Palliative Care Assessment & Plan   Patient Profile: 56 y.o. female  with past medical history of hypertension, thyroid disease, COPD, and lung cancer admitted on 02/06/2017 with weakness and poor oral intake. Patient followed by Dr. Mike Gip outpatient for metastatic poorly differentiated adenocarcinoma of lung. PET scan on 01/29/17 revealed abnormal adenopathy in neck, chest, and upper abdomen suspicious for metastatic lung cancer. Patient defers biopsy. Last chemo November 2017. In ED, chest xray negative. CT scan head negative for acute infarct or abnormality. Patient with dehydration and failure to thrive. Palliative medicine consultation for goals of care.    Assessment: Altered mental status  Dehydration Hypothyroidism Stage IV adenocarcinoma of lung Depression Failure to thrive  Recommendations/Plan:  Remains FULL code.   Continue all medical interventions to prolong life.   Family to discuss and consider my recommendations.  I will follow-up with family tomorrow, 02/12/17.   Goals of Care and Additional Recommendations:  Limitations on Scope of Treatment: Full Scope Treatment  Code Status: FULL   Code Status Orders        Start     Ordered   02/07/17 1026  Full code  Continuous     02/07/17 1026    Code Status History    Date Active Date Inactive Code Status Order ID Comments User Context   11/07/2016  6:18 AM 11/11/2016  2:39 PM Full Code [id number]  Harrie Foreman, MD Inpatient   06/21/2016 11:52 AM 06/22/2016  3:07 PM Full Code [id number]  [name], MD Inpatient       Prognosis:   < 2 weeks guarded with severe hypothyroidism, functional and nutritional status decline, and failure to thrive.   Discharge Planning:  To Be  Determined  Care plan was discussed with patient, family, RN, and Dr. Ether Griffins  Thank you for allowing the Palliative Medicine Team to assist in the care of this patient.   Time In: 1600 Time Out: 1700 Total Time 15mn Prolonged Time Billed  no       Greater than 50%  of this time was spent counseling and coordinating care related to the above assessment and plan.  MIhor Dow FNP-C Palliative Medicine Team  Phone: [phone]: [phone] Please contact Palliative Medicine Team phone at [phone] questions and concerns.

## 2017-02-11 NOTE — Progress Notes (Signed)
Per Dr. Ether Griffins, do not given narcan at this time.

## 2017-02-11 NOTE — Progress Notes (Addendum)
Pt has had no urine output since in&out cath done at 0600, IVF infusing '@50ml'$ /hr.  Bladder scan only shows 161.  Pt has been lethargic during this shift, but does awaken to voice, minimal po intake, vitals are stable at this time.  Dr. Ether Griffins made aware and verbal order given to increase fluids to 178m/hr and wait to see if pt voids.  Verbal order for narcan to see if pt becomes more alert.  Family at bedside and meeting with palliative care at this time.  DClarise Cruz RN

## 2017-02-11 NOTE — Progress Notes (Signed)
Patient hypotensive this am and with urinary retention, MD notified. 500 ml bolus given with improvement and In and Out cath performed per MD order. Continuous IV fluids at 50 ml/hr ordered  per MD, continue to monitor.

## 2017-02-11 NOTE — Progress Notes (Signed)
PT Cancellation Note  Patient Details Name: Audrey Mcgee MRN: [id number] DOB: 1961-04-08   Cancelled Treatment:    Reason Eval/Treat Not Completed: Fatigue/lethargy limiting ability to participate.  Pt's oldest daughter and pt's boyfriend present and agreeable to therapist attempting to wake pt for therapy.  Pt briefly woke up but did not answer any yes/no questions and then fell back asleep.  Pt's visitors requesting for PT to try again at another time.  Will re-attempt PT eval at a later date/time as able.  Leitha Bleak, PT 02/11/17, 10:21 AM [phone]

## 2017-02-12 DIAGNOSIS — F1210 Cannabis abuse, uncomplicated: Secondary | ICD-10-CM

## 2017-02-12 DIAGNOSIS — R110 Nausea: Secondary | ICD-10-CM

## 2017-02-12 DIAGNOSIS — R5383 Other fatigue: Secondary | ICD-10-CM

## 2017-02-12 DIAGNOSIS — C3490 Malignant neoplasm of unspecified part of unspecified bronchus or lung: Secondary | ICD-10-CM

## 2017-02-12 DIAGNOSIS — R4182 Altered mental status, unspecified: Secondary | ICD-10-CM

## 2017-02-12 DIAGNOSIS — E876 Hypokalemia: Secondary | ICD-10-CM

## 2017-02-12 DIAGNOSIS — R627 Adult failure to thrive: Secondary | ICD-10-CM

## 2017-02-12 LAB — COMPREHENSIVE METABOLIC PANEL
ALT: 6 U/L — ABNORMAL LOW (ref 14–54)
AST: 15 U/L (ref 15–41)
Albumin: 2.9 g/dL — ABNORMAL LOW (ref 3.5–5.0)
Alkaline Phosphatase: 57 U/L (ref 38–126)
Anion gap: 9 (ref 5–15)
BILIRUBIN TOTAL: 0.8 mg/dL (ref 0.3–1.2)
BUN: 9 mg/dL (ref 6–20)
CHLORIDE: 111 mmol/L (ref 101–111)
CO2: 19 mmol/L — ABNORMAL LOW (ref 22–32)
Calcium: 8.1 mg/dL — ABNORMAL LOW (ref 8.9–10.3)
Creatinine, Ser: 0.92 mg/dL (ref 0.44–1.00)
Glucose, Bld: 103 mg/dL — ABNORMAL HIGH (ref 65–99)
POTASSIUM: 3.6 mmol/L (ref 3.5–5.1)
Sodium: 139 mmol/L (ref 135–145)
TOTAL PROTEIN: 6.4 g/dL — AB (ref 6.5–8.1)

## 2017-02-12 LAB — CBC
HEMATOCRIT: 37.3 % (ref 35.0–47.0)
Hemoglobin: 12.8 g/dL (ref 12.0–16.0)
MCH: 33.6 pg (ref 26.0–34.0)
MCHC: 34.2 g/dL (ref 32.0–36.0)
MCV: 98.2 fL (ref 80.0–100.0)
Platelets: 249 10*3/uL (ref 150–440)
RBC: 3.8 MIL/uL (ref 3.80–5.20)
RDW: 14.7 % — AB (ref 11.5–14.5)
WBC: 6.7 10*3/uL (ref 3.6–11.0)

## 2017-02-12 MED ORDER — IPRATROPIUM-ALBUTEROL 0.5-2.5 (3) MG/3ML IN SOLN
3.0000 mL | RESPIRATORY_TRACT | Status: DC
  Administered 2017-02-12 – 2017-02-14 (×10): 3 mL via RESPIRATORY_TRACT
  Filled 2017-02-12 (×10): qty 3

## 2017-02-12 MED ORDER — IPRATROPIUM-ALBUTEROL 0.5-2.5 (3) MG/3ML IN SOLN: 3.0000 mL | RESPIRATORY_TRACT | 5 refills | Status: AC

## 2017-02-12 MED ORDER — BOOST / RESOURCE BREEZE PO LIQD: 1.0000 | Freq: Two times a day (BID) | ORAL | 6 refills | Status: AC

## 2017-02-12 MED ORDER — LEVOTHYROXINE SODIUM 50 MCG PO TABS: 50.0000 ug | ORAL_TABLET | Freq: Every day | ORAL | 1 refills | Status: DC

## 2017-02-12 MED ORDER — ONDANSETRON HCL 4 MG PO TABS: 4.0000 mg | ORAL_TABLET | Freq: Four times a day (QID) | ORAL | 0 refills | Status: AC | PRN

## 2017-02-12 MED ORDER — SENNA 8.6 MG PO TABS: 1.0000 | ORAL_TABLET | Freq: Every day | ORAL | 0 refills | Status: DC

## 2017-02-12 NOTE — Progress Notes (Signed)
Nutrition Follow-up  DOCUMENTATION CODES:   Severe malnutrition in context of chronic illness  INTERVENTION:  Patient only meeting 23% calorie needs and 9% protein needs even with oral nutrition supplements and Megace. At this time recommend discussion about short-term versus long-term access for enteral nutrition as patient remains full code and full scope treatment. If prognosis expected to still remain <2 weeks even with adequate nutrition, would not necessarily recommend nutrition support in that case.   Continue Ensure Enlive po TID, each supplement provides 350 kcal and 20 grams of protein. Ordered through [facility] to come TID on meal trays mixed in ice cream to make milkshake.  Continue Boost Breeze po BID between meals, each supplement provides 250 kcal and 9 grams of protein.  NUTRITION DIAGNOSIS:   Inadequate oral intake related to poor appetite, cancer and cancer related treatments, nausea as evidenced by other (see comment) (per RN and SLP report).  Ongoing.  GOAL:   Patient will meet greater than or equal to 90% of their needs  Not met.   MONITOR:   PO intake, Supplement acceptance, Labs, I & O's, Weight trends  REASON FOR ASSESSMENT:   Consult Assessment of nutrition requirement/status  ASSESSMENT:   56 year old female with PMHx of HTN, COPD, lung cancer who presents with generalized weakness, nausea, poor oral intake.   -Patient being followed by Palliative Medicine. Patient has <2 weeks prognosis. Patient remains full code, full scope treatment.   Spoke with patient and family members at bedside. Patient reports her appetite is "off and on" and that she has not felt like eating. Family reports that yesterday patient had some mashed potatoes, apple sauce, 1/2 Boost Breeze, grape juice, orange juice, 1-2 spoonfuls Magic Cup, 1-2 spoonfuls chicken noodle soup. Patient has not touched breakfast tray in room. She has not tried the [employer]  coming on trays. They report she has been coughing. Denies N/V or abdominal pain. Discussed with family that what she is eating is not enough to meet her needs or prevent further weight loss. Encouraged intake of Ensure milkshakes on trays and Boost Breeze to help meet calorie and protein needs.  Per family's report of intake, patient has had approximately 377 kcal (23% minimum estimated kcal needs) and 7.5 grams of protein (9% minimum estimated protein needs) in the past 24 hours.   Meal Completion: not recorded in chart.   Medications reviewed and include: folic acid 1 mg daily, levothyroxine, Megace 400 mg BID, pantoprazole, senna.  Labs reviewed: CO2 19, Glucose 103.   Weight trend: RD attempted to check bed scale weight to trend from admission, but scale was not zeroed properly.   I/O past 24 hrs: 500 ml urine output and another unmeasured occurrence, 1 bowel movement today  Discussed with RN and Palliative Medicine.  Diet Order:  DIET SOFT Room service appropriate? Yes; Fluid consistency: Thin Diet - low sodium heart healthy  Skin:  Reviewed, no issues  Last BM:  02/12/2017  Height:   Ht Readings from Last 1 Encounters:  02/07/17 _0  (1.626 m)    Weight:   Wt Readings from Last 1 Encounters:  02/09/17 122 lb 11.2 oz (55.7 kg)    Ideal Body Weight:  54.5 kg  BMI:  Body mass index is 21.06 kg/m.  Estimated Nutritional Needs:   Kcal:  1670-1950 (30-35 kcal/kg)  Protein:  85-95 grams (1.5-1.7 grams/kg)  Fluid:  1.6 L/day (30 ml/kg)  EDUCATION NEEDS:   Education needs no appropriate at  this time  Willey Blade, MS, RD, LDN Pager: [phone] After Hours Pager: [phone]

## 2017-02-12 NOTE — Progress Notes (Signed)
New referral for home palliative services following discharge received from [facility]. Information faxed to referral. Thank you. [name] RN, BSN, [place] [facility] [place], [facility] [phone] c

## 2017-02-12 NOTE — Progress Notes (Signed)
[facility] [place] NAME: Audrey Mcgee    MR#:  [id number]  DATE OF BIRTH:  January 05, 1961  SUBJECTIVE:  CHIEF COMPLAINT:   Chief Complaint  Patient presents with  . Weakness   The patient is 56 year old African-American female with past medical history significant for history of lung cancer, hypertension, thyroid disease, COPD, who presents to the hospital with complaints of generalized weakness, no energy, nausea, no vomiting, poor oral intake. In emergency room, she was felt to be dehydrated and was admitted. Tests were negative for flu and lactate was normal. Patient denied any abdominal discomfort, although admited of constipation for 3-4 days. She had to use enema about 3 days ago to produce bowel movements, the patient is on opiates chronically at home. KUB in the hospital showed no significant abnormalities, including no constipation, ileus Patient complained of dysphagia, food getting stuck in lower neck area, solid food as well as liquids. She was very tube, gastroenterologist, upper GI series was recommended as well as swallowing evaluation. TSH came back high. Urine drug screen was positive for opiates as well as cannabinoids.  Patient Remains Somnolent today, however, able to answer simple questions. Now off methimazole. But  TSH remains very high, even higher than before. Patient was initiated on Synthroid. The patient was seen by psychiatrist , initiated on Remeron, oral intake remains poor,. Palliative care met with patient's brother and the patient herself yesterday, recommended palliative care. Follow up in the facility. Patient was seen by physical therapist, skilled nursing facility for rehabilitation was recommended No new changes today, patient is not able to provide review of systems, but patient's patient's eldest daughter, who is concerned, the patient is declining physically as well as mentally, she is concerned that patient  may need hospice home, if no significant improvement in the nearest future. Patient is a little bit more alert today, however, her oral intake did not improve. The patient's family wanted gastroenterologist consultation for possible PEG tube placement, if patient is a candidate due to her stage IV lung cancer.   Review of Systems  Unable to perform ROS: Mental acuity    VITAL SIGNS: Blood pressure (!) 150/95, pulse 89, temperature 97.6 F (36.4 C), temperature source Oral, resp. rate 16, height 5' 4"  (1.626 m), weight 55.7 kg (122 lb 11.2 oz), last menstrual period 01/30/2008, SpO2 100 %.  PHYSICAL EXAMINATION:   GENERAL:  56 y.o.-year-old patient lying in the bed with no acute distress. Slow to respond, comfortable, remains somnolent, but able to open her eyes and converse some.  Exophthalmus was noted EYES: Pupils equal, round, reactive to light and accommodation. No scleral icterus. Extraocular muscles intact.  HEENT: Head atraumatic, normocephalic. Oropharynx and nasopharynx clear.  NECK:  Supple, no jugular venous distention. No thyroid enlargement, no tenderness.  LUNGS: Normal breath sounds bilaterally, no wheezing, rales,rhonchi or crepitation. No use of accessory muscles of respiration.  CARDIOVASCULAR: S1, S2 normal. No murmurs, rubs, or gallops.  ABDOMEN: Soft, nontender, nondistended. Bowel sounds present. No organomegaly or mass.  EXTREMITIES: No pedal edema, cyanosis, or clubbing.  NEUROLOGIC: Cranial nerves II through XII are grossly intact. Muscle strength 5/5 in all extremities. Sensation intact. Gait not checked.  PSYCHIATRIC: The patient remains somnolent, but somewhat easier to arouse, able to engage in  conversation SKIN: No obvious rash, lesion, or ulcer.   ORDERS/RESULTS REVIEWED:   CBC  Recent Labs Lab 02/06/17 0827 02/06/17 2231 02/07/17 1107 02/12/17 0416  WBC 5.2 4.8  5.8 6.7  HGB 14.3 11.4* 12.8 12.8  HCT 40.5 33.2* 38.7 37.3  PLT 378 251 306 249  MCV  97.3 99.0 99.5 98.2  MCH 34.2* 34.1* 33.0 33.6  MCHC 35.2 34.5 33.2 34.2  RDW 14.2 14.3 14.0 14.7*  LYMPHSABS 1.3  --   --   --   MONOABS 0.4  --   --   --   EOSABS 0.1  --   --   --   BASOSABS 0.0  --   --   --    ------------------------------------------------------------------------------------------------------------------  Chemistries   Recent Labs Lab 02/06/17 0827 02/06/17 2231 02/07/17 1107 02/08/17 0425 02/10/17 0548 02/11/17 0845 02/12/17 0416  NA 136 140 138 138  --  135 139  K 3.0* 3.1* 3.1* 3.4* 4.8 4.2 3.6  CL 102 115* 107 109  --  107 111  CO2 22 17* 21* 20*  --  19* 19*  GLUCOSE 88 66 68 89  --  92 103*  BUN 9 7 <5* <5*  --  9 9  CREATININE 1.07* 0.85 0.98 0.90  --  1.53* 0.92  CALCIUM 9.4 7.5* 8.4* 8.2*  --  8.6* 8.1*  MG 1.8  --  1.5*  --  2.2  --   --   AST 20 20  --   --   --   --  15  ALT 8* 8*  --   --   --   --  6*  ALKPHOS 54 40  --   --   --   --  57  BILITOT 1.2 0.9  --   --   --   --  0.8   ------------------------------------------------------------------------------------------------------------------ estimated creatinine clearance is 61.1 mL/min (by C-G formula based on SCr of 0.92 mg/dL). ------------------------------------------------------------------------------------------------------------------  Recent Labs  02/11/17 0358  TSH 107.000*    Cardiac Enzymes No results for input(s): CKMB, TROPONINI, MYOGLOBIN in the last 168 hours.  Invalid input(s): CK ------------------------------------------------------------------------------------------------------------------ Invalid input(s): POCBNP ---------------------------------------------------------------------------------------------------------------  RADIOLOGY: No results found.  EKG:  Orders placed or performed during the hospital encounter of 02/06/17  . ED EKG  . ED EKG  . EKG    ASSESSMENT AND PLAN:  Active Problems:   Dehydration   Hypothyroidism   Malignancy  (H[facility])   Altered mental status   Cannabis abuse   Failure to thrive in adult   Depressive disorder   Nausea   Generalized weakness   Palliative care by specialist   Goals of care, counseling/discussion   DNR (do not resuscitate) discussion   Hypokalemia   Lung cancer ([place])  #1. Altered mental state , concerning for hypothyroid coma, now on Synthroid, some improved mental status today. Symptoms are likely multifactorial, due to marijuana abuse, opiates, clinical hypothyroidism, possible underlying depression. Appreciate psychiatrist  input. The patient underwent brain MRI to rule out metastasis, no metastases were found, but atrophy.  #2. Intermittent nausea and vomiting,  continue patient on  Protonix, Zofran,  KUB was negative for constipation, no barium swallowing was performed,  appreciate gastroenterologist input,  palliative care met with the patient and patient's brother, POA ,  palliative care follow up in skilled nursing facility or home was recommended versus hospice home placement. Not noted to have vomiting in the hospital, however oral intake remains minimal. Getting gastroenterologist to see patient again for possible PEG tube placement, if patient is a candidate in view of her stage IV lung cancer, unfortunately, no gastroenterologist is available until tomorrow evening. Discussed extensively  with family and palliative care #3. Failure to thrive adult, continue patient on Megace, Protonix, appreciate oncologist input, oral intake remains poor on Remeron, no barium swallowing study was performed so far, patient's family requested PEG tube placement, if patient is a candidate #4. Hypokalemia, supplemented orally, magnesium level was low, supplemented intravenously and orally, normal potassium and magnesium yesterday #5 hypothyroidism, now off methimazole, TSH still high at 107, free T4 is low at less than 0.25, patient has clinical hypothyroidism, continue her on Synthroid, some better  clinically. Cause of the thyroid disease is unclear, but obviously methimazole is not needed at this time. #6  stage IV adenocarcinoma of the lung, patient was seen by oncologist during this admission,, it was noted that patient has not received any treatment for lung cancer since November 2017, she refused biopsy of the lymph node in the past. Dr. Mike Gip saw patient in consultation , she did not feel that the cause of failure to thrive is related to malignancy.  Palliative care discussed with patient and the brother , that oncologist may not initiate chemotherapy if her oral intake remains low . She was instructed by oncologist to follow up with Dr. Mike Gip on 02/09/2017 for further evaluation and consideration of chemotherapy. Brain MRI is negative for metastasis, but atrophy , bone scan 01/29/2017 did not show abnormalities in brain/skull.  #7. Generalized weakness, physical therapist evaluated patient and recommended skilled nursing facility placement for rehabilitation, discharge planning is underway, patient may not be able to participate in skilled nursing facility physical therapy requirements, physical therapist attempted to reassess patient today, however, patient refused    Management plans discussed with the patient, her daughter, all questions were answered  DRUG ALLERGIES:  Allergies  Allergen Reactions  . No Known Allergies     CODE STATUS:     Code Status Orders        Start     Ordered   02/07/17 1026  Full code  Continuous     02/07/17 1026    Code Status History    Date Active Date Inactive Code Status Order ID Comments User Context   11/07/2016  6:18 AM 11/11/2016  2:39 PM Full Code [id number]  Harrie Foreman, MD Inpatient   06/21/2016 11:52 AM 06/22/2016  3:07 PM Full Code [id number]  [name], MD Inpatient      TOTAL TIME TAKING CARE OF THIS PATIENT: 40 minutes.  Discussed with patient's daughter, updated, all questions were answered  Tata Timmins M.D on  02/12/2017 at 5:20 PM  Between 7am to 6pm - Pager - [phone]  After 6pm go to [email or web address] - password [email or web address]  Office  [phone]  CC: Primary care physician; Donnie Coffin, MD

## 2017-02-12 NOTE — Progress Notes (Signed)
PT Cancellation Note  Patient Details Name: Audrey Mcgee MRN: [id number] DOB: 05-25-1961   Cancelled Treatment:    Reason Eval/Treat Not Completed: Patient declined, no reason specified.  Pt's daughter reporting talking with MD this morning and (per discussion with MD) pt needed to be able to eat more in order to participate in therapy (pt's daughter requesting to try PT another day after pt had a chance to eat more and have more energy).  CM notified.  Will re-attempt PT eval at a later date/time as medically appropriate.  Leitha Bleak, PT 02/12/17, 12:20 PM [phone]

## 2017-02-12 NOTE — Progress Notes (Signed)
Daily Progress Note   Patient Name: Audrey Mcgee       Date: 02/12/2017 DOB: 05/26/1961  Age: 56 y.o. MRN#: [id number] Attending Physician: [name], MD Primary Care Physician: Donnie Coffin, MD Admit Date: 02/06/2017  Reason for Consultation/Follow-up: Establishing goals of care  Subjective: Patient awake and alert this afternoon. Answers few questions.   Daughter, boyfriend, and brother at bedside. Daughter tells me her mother has been much more awake, alert, and communicating with family. The family discussed code status and artificial nutrition together last night and the patient told them she would want all life prolonging measures to keep her alive, including resuscitation, life support, and a feeding tube. Either the daughter or boyfriend will be available for the patient so she is not alone at home.   They are hopeful for continued improvement since she has shown cognitive improvement today. Discussed overall concern of poor nutritional status and high risk for rehospitalization and cycle of dehydration if we send her home. Family agrees with moving forward with GI consult to evaluate if she is a candidate for feeding tube placement.   Educated on palliative services to follow outpatient in order to continue conversations regarding goals of care, code status, and if she should decline, transition to hospice services. Family agrees with this plan.     Length of Stay: 1  Current Medications: Scheduled Meds:  . enoxaparin (LOVENOX) injection  40 mg Subcutaneous Q24H  . feeding supplement  1 Container Oral BID BM  . feeding supplement (ENSURE ENLIVE)  237 mL Oral TID WC  . folic acid  1 mg Oral Daily  . ipratropium-albuterol  3 mL Nebulization Q4H  . levothyroxine  50 mcg  Oral QAC breakfast  . losartan  100 mg Oral Daily  . mouth rinse  15 mL Mouth Rinse BID  . megestrol  400 mg Oral BID  . metoprolol tartrate  50 mg Oral BID  . naLOXone (NARCAN)  injection  0.4 mg Intravenous Once  . nystatin  5 mL Oral QID  . pantoprazole  40 mg Oral Daily  . senna  1 tablet Oral Daily  . tiotropium  18 mcg Inhalation Daily    Continuous Infusions:   PRN Meds: acetaminophen **OR** acetaminophen, benzonatate, lidocaine, ondansetron **OR** ondansetron (ZOFRAN) IV, oxyCODONE, sodium chloride  Physical Exam  Constitutional: She appears ill.  HENT:  Head: Normocephalic and atraumatic.  Cardiovascular: An irregularly irregular rhythm present.  Pulmonary/Chest: Effort normal. No accessory muscle usage. No tachypnea. No respiratory distress. She has rhonchi.  Abdominal: Bowel sounds are normal. She exhibits distension. There is no tenderness.  Neurological: She is alert.  Skin: Skin is warm and dry.  Psychiatric: Her speech is delayed. Cognition and memory are impaired. She is inattentive.  Nursing note and vitals reviewed.          Vital Signs: BP (!) 150/95 (BP Location: Left Arm)   Pulse 89   Temp 97.6 F (36.4 C) (Oral)   Resp 16   Ht '5\' 4"'$  (1.626 m)   Wt 55.7 kg (122 lb 11.2 oz) Comment: bed scale  LMP 01/30/2008 Comment: 9 yrs ago  SpO2 100%   BMI 21.06 kg/m  SpO2: SpO2: 100 % O2 Device: O2 Device: Not Delivered O2 Flow Rate:    Intake/output summary:   Intake/Output Summary (Last 24 hours) at 02/12/17 1357 Last data filed at 02/12/17 1610  Gross per 24 hour  Intake          1673.33 ml  Output              500 ml  Net          1173.33 ml   LBM: Last BM Date: 02/12/17 Baseline Weight: Weight: 49.9 kg (110 lb) Most recent weight: Weight: 55.7 kg (122 lb 11.2 oz) (bed scale)       Palliative Assessment/Data: PPS 20%   Flowsheet Rows   Flowsheet Row Most Recent Value  Intake Tab  Referral Department  Hospitalist  Unit at Time of Referral   Oncology Unit  Palliative Care Primary Diagnosis  Cancer  Date Notified  02/08/17  Palliative Care Type  New Palliative care  Reason for referral  Clarify Goals of Care  Date of Admission  02/06/17  Date first seen by Palliative Care  02/10/17  # of days IP prior to Palliative referral  2  Clinical Assessment  Palliative Performance Scale Score  20%  Psychosocial & Spiritual Assessment  Palliative Care Outcomes  Patient/Family meeting held?  Yes  Who was at the meeting?  patient, brother, two daughters  Palliative Care Outcomes  Clarified goals of care, Provided psychosocial or spiritual support, Counseled regarding hospice, ACP counseling assistance, Provided end of life care assistance      Patient Active Problem List   Diagnosis Date Noted  . Altered mental status 02/12/2017  . Cannabis abuse 02/12/2017  . Nausea 02/12/2017  . Failure to thrive in adult 02/12/2017  . Hypokalemia 02/12/2017  . Generalized weakness 02/12/2017  . Lung cancer ([place]) 02/12/2017  . Hypothyroidism 02/11/2017  . Malignancy ([place])   . Palliative care by specialist   . Goals of care, counseling/discussion   . DNR (do not resuscitate) discussion   . Depressive disorder   . Dehydration 02/07/2017  . Weight loss 02/07/2017  . Encounter for antineoplastic chemotherapy 02/01/2017  . 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 Sepsis ([place]) 11/07/2016  . Protein-calorie malnutrition, severe 11/07/2016  . Acute respiratory distress 06/21/2016  . COPD exacerbation ([place]) 06/21/2016  . Sinus tachycardia 06/21/2016  . Recurrent aspiration bronchitis/pneumonia ([place]) 06/21/2016  . Dysphagia 06/21/2016  . Nausea and vomiting 06/21/2016  . Dental abscess 05/10/2016  . Swallowing difficulty 03/28/2016  . Hypomagnesemia 02/29/2016  . Hoarseness 02/18/2016  . Cancer related pain 07/08/2015  . Adenocarcinoma, lung ([place]) 03/26/2015  .  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 Ear ache  10/03/2014  . Abscess of buccal cavity 10/03/2014  . Lump in neck 10/03/2014  . Laryngeal pain 10/03/2014    Palliative Care Assessment & Plan   Patient Profile: 56 y.o. female  with past medical history of hypertension, thyroid disease, COPD, and lung cancer admitted on 02/06/2017 with weakness and poor oral intake. Patient followed by Dr. Mike Gip outpatient for metastatic poorly differentiated adenocarcinoma of lung. PET scan on 01/29/17 revealed abnormal adenopathy in neck, chest, and upper abdomen suspicious for metastatic lung cancer. Patient defers biopsy. Last chemo November 2017. In ED, chest xray negative. CT scan head negative for acute infarct or abnormality. Patient with dehydration and failure to thrive. Palliative medicine consultation for goals of care.    Assessment: Altered mental status  Dehydration Hypothyroidism Stage IV adenocarcinoma of lung Depression Failure to thrive  Recommendations/Plan:  Remains FULL code.   Continue all medical interventions to prolong life.   Family agrees to move forward with GI consult to see if she is a candidate for feeding tube placement.  Family agrees with palliative services to follow outpatient.   PMT will continue to shadow chart and support patient and family through hospitalization.   Goals of Care and Additional Recommendations:  Limitations on Scope of Treatment: Full Scope Treatment  Code Status: FULL   Code Status Orders        Start     Ordered   02/07/17 1026  Full code  Continuous     02/07/17 1026    Code Status History    Date Active Date Inactive Code Status Order ID Comments User Context   11/07/2016  6:18 AM 11/11/2016  2:39 PM Full Code [id number]  Harrie Foreman, MD Inpatient   06/21/2016 11:52 AM 06/22/2016  3:07 PM Full Code [id number]  [name], MD Inpatient       Prognosis:   Unable to determine guarded with severe hypothyroidism, functional and nutritional status decline, and failure  to thrive.   Discharge Planning:  To Be Determined likely home with home health/palliative services  Care plan was discussed with patient, family, RN, and Dr. Ether Griffins  Thank you for allowing the Palliative Medicine Team to assist in the care of this patient.   Time In/Out: 1200-1230 [id number]  Total Time 48mn Prolonged Time Billed  no       Greater than 50%  of this time was spent counseling and coordinating care related to the above assessment and plan.  MIhor Dow FNP-C Palliative Medicine Team  Phone: [phone]: [phone] Please contact Palliative Medicine Team phone at [phone] questions and concerns.

## 2017-02-12 NOTE — Progress Notes (Signed)
[place] attempted a follow up visit. Pt was asleep. CH had silent prayer at the doorway. [place] will follow up later today if possible.     02/12/17 1500  Clinical Encounter Type  Visited With Patient;Patient not available  Spiritual Encounters  Spiritual Needs Prayer

## 2017-02-12 NOTE — Care Management (Signed)
Palliative Care representative, Ihor Dow NP requested palliative care in the home. [name], RN representative for [facility] updated. Shelbie Ammons RN MSN CCM Care Management

## 2017-02-13 ENCOUNTER — Other Ambulatory Visit: Payer: Self-pay | Admitting: Hematology and Oncology

## 2017-02-13 ENCOUNTER — Inpatient Hospital Stay: Payer: Medicaid Other

## 2017-02-13 DIAGNOSIS — Z7901 Long term (current) use of anticoagulants: Secondary | ICD-10-CM

## 2017-02-13 DIAGNOSIS — R4182 Altered mental status, unspecified: Secondary | ICD-10-CM

## 2017-02-13 DIAGNOSIS — E279 Disorder of adrenal gland, unspecified: Secondary | ICD-10-CM

## 2017-02-13 DIAGNOSIS — Z923 Personal history of irradiation: Secondary | ICD-10-CM

## 2017-02-13 DIAGNOSIS — C3411 Malignant neoplasm of upper lobe, right bronchus or lung: Secondary | ICD-10-CM

## 2017-02-13 DIAGNOSIS — R599 Enlarged lymph nodes, unspecified: Secondary | ICD-10-CM

## 2017-02-13 DIAGNOSIS — E519 Thiamine deficiency, unspecified: Secondary | ICD-10-CM

## 2017-02-13 LAB — VITAMIN B1: Vitamin B1 (Thiamine): 34.9 nmol/L — ABNORMAL LOW (ref 66.5–200.0)

## 2017-02-13 LAB — TSH: TSH: 50 u[IU]/mL — AB (ref 0.350–4.500)

## 2017-02-13 LAB — T4, FREE: FREE T4: 0.4 ng/dL — AB (ref 0.61–1.12)

## 2017-02-13 MED ORDER — SODIUM CHLORIDE 0.9 % IV SOLN
500.0000 mg | Freq: Three times a day (TID) | INTRAVENOUS | Status: AC
  Administered 2017-02-13 – 2017-02-15 (×6): 500 mg via INTRAVENOUS
  Filled 2017-02-13 (×6): qty 5

## 2017-02-13 NOTE — Discharge Planning (Signed)
During oncology rounds, family was informed that patient would benefit from at least one more day in hospital to assist with strengthening.  Family educated that currently, we're not medically assisting patient's needs.  Though strengthening does need to be done, it will take time and can be done in outpt or at home.  Informed that PT needs to assess situation and patient abilities.  Furthermore, insurance regulations suggesting DC today is no further medical needs at found. Family voiced understanding, but expressed strong desire for patient to go to rehab if possible.

## 2017-02-13 NOTE — Consult Note (Addendum)
Reason for Consult:Generalized weakness Referring Physician: Mike Gip  CC: Generalized weakness  HPI: Audrey Mcgee is an 56 y.o. female with a history of stage IV adenocarcinoma of the lung without brain metastasis who presents with failure to thrive. Patient has been eating poorly at home.  Has developed generalized weakness to the point that she is not ambulating.  Developed nausea and vomiting on chemotherapy and was admitted.  Family does not report incontinence.  Does have some times when she appears to be hallucinating but does interact with family.  This seems to be less so since admission.    Past Medical History:  Diagnosis Date  . COPD (chronic obstructive pulmonary disease) ([place])   . Hypertension   . Lung cancer ([place])   . Thyroid disease     Past Surgical History:  Procedure Laterality Date  . CESAREAN SECTION CLASSICAL    . STOMACH SURGERY     Pt reports for a tumor    Family History  Problem Relation Age of Onset  . Hypertension Father   . Cancer Father   . Diabetes Father   . Cancer Maternal Aunt     Social History:  reports that she has been smoking Cigarettes.  She has smoked for the past 15.00 years. She has never used smokeless tobacco. She reports that she drinks about 1.2 oz of alcohol per week . She reports that she does not use drugs.  Allergies  Allergen Reactions  . No Known Allergies     Medications:  I have reviewed the patient's current medications. Prior to Admission:  Prescriptions Prior to Admission  Medication Sig Dispense Refill Last Dose  . dexamethasone (DECADRON) 4 MG tablet Take '4mg'$  BID the day before and day after chemotherapy. (Patient taking differently: Take 4 mg by mouth 2 (two) times daily. Take '4mg'$  BID the day before and day after chemotherapy.) 20 tablet 0 Past Week at Unknown time  . folic acid (FOLVITE) 341 MCG tablet Take 800 mcg by mouth daily.    02/06/2017 at Unknown time  . losartan (COZAAR) 100 MG tablet Take 1  tablet (100 mg total) by mouth daily. 30 tablet 0 02/06/2017 at Unknown time  . magnesium oxide (MAG-OX) 400 MG tablet Take 1 tablet (400 mg total) by mouth daily. 30 tablet 0 02/06/2017 at Unknown time  . megestrol (MEGACE) 40 MG/ML suspension Take 10 mLs (400 mg total) by mouth 2 (two) times daily. 300 mL 1 02/06/2017 at Unknown time  . methimazole (TAPAZOLE) 5 MG tablet take 1 tablet by mouth twice a day for OVERACTIVE THYROID  0 02/06/2017 at Unknown time  . metoprolol (LOPRESSOR) 50 MG tablet take 1 tablet by mouth twice a day NOTE DOSE INCREASE  0 02/06/2017 at Unknown time  . morphine (MS CONTIN) 15 MG 12 hr tablet Take 1 tablet (15 mg total) by mouth 2 (two) times daily. 30 tablet 0 02/06/2017 at Unknown time  . omeprazole (PRILOSEC) 20 MG capsule Take 1 capsule (20 mg total) by mouth daily. 30 capsule 3 02/06/2017 at Unknown time  . ondansetron (ZOFRAN) 8 MG tablet Take 1 tablet (8 mg total) by mouth 2 (two) times daily as needed for nausea or vomiting. 30 tablet 1 02/06/2017 at Unknown time  . Oxycodone HCl 10 MG TABS Take 1 tablet (10 mg total) by mouth every 4 (four) hours as needed (pain). 30 tablet 0 02/06/2017 at Unknown time  . potassium chloride (K-DUR) 10 MEQ tablet Take 1 tablet (10 mEq total)  by mouth 2 (two) times daily. For 3 days 6 tablet 0 02/06/2017 at Unknown time  . tiotropium (SPIRIVA) 18 MCG inhalation capsule Place 1 capsule (18 mcg total) into inhaler and inhale daily. 30 capsule 1 02/06/2017 at Unknown time  . docusate sodium (COLACE) 100 MG capsule Take 100 mg by mouth 2 (two) times daily as needed for mild constipation.   Not Taking at Unknown time  . lidocaine (XYLOCAINE) 2 % solution Use as directed 10 mLs in the mouth or throat every 4 (four) hours as needed for mouth pain.   Not Taking at Unknown time  . predniSONE (DELTASONE) 20 MG tablet Take 2 tablets (40 mg total) by mouth daily with breakfast. (Patient not taking: Reported on 02/07/2017) 8 tablet 0 Not Taking at Unknown  time   Scheduled: . enoxaparin (LOVENOX) injection  40 mg Subcutaneous Q24H  . feeding supplement  1 Container Oral BID BM  . feeding supplement (ENSURE ENLIVE)  237 mL Oral TID WC  . folic acid  1 mg Oral Daily  . ipratropium-albuterol  3 mL Nebulization Q4H  . levothyroxine  50 mcg Oral QAC breakfast  . losartan  100 mg Oral Daily  . mouth rinse  15 mL Mouth Rinse BID  . megestrol  400 mg Oral BID  . metoprolol tartrate  50 mg Oral BID  . naLOXone (NARCAN)  injection  0.4 mg Intravenous Once  . nystatin  5 mL Oral QID  . pantoprazole  40 mg Oral Daily  . senna  1 tablet Oral Daily  . thiamine injection  500 mg Intravenous Q8H  . tiotropium  18 mcg Inhalation Daily    ROS: History obtained from family  General ROS: negative for - chills, fatigue, fever, night sweats, weight gain or weight loss Psychological ROS: negative for - behavioral disorder, hallucinations, memory difficulties, mood swings or suicidal ideation Ophthalmic ROS: negative for - blurry vision, double vision, eye pain or loss of vision ENT ROS: grinds teeth Allergy and Immunology ROS: negative for - hives or itchy/watery eyes Hematological and Lymphatic ROS: negative for - bleeding problems, bruising or swollen lymph nodes Endocrine ROS: negative for - galactorrhea, hair pattern changes, polydipsia/polyuria or temperature intolerance Respiratory ROS: negative for - cough, hemoptysis, shortness of breath or wheezing Cardiovascular ROS: negative for - chest pain, dyspnea on exertion, edema or irregular heartbeat Gastrointestinal ROS: nausea/vomiting Genito-Urinary ROS: negative for - dysuria, hematuria, incontinence or urinary frequency/urgency Musculoskeletal ROS: negative for - joint swelling or muscular weakness Neurological ROS: as noted in HPI Dermatological ROS: negative for rash and skin lesion changes  Physical Examination: Blood pressure (!) 142/82, pulse 86, temperature 97.5 F (36.4 C), temperature  source Oral, resp. rate 20, height '5\' 4"'$  (1.626 m), weight 55.7 kg (122 lb 11.2 oz), last menstrual period 01/30/2008, SpO2 98 %.  Gen: cachectic.   HEENT-  Normocephalic, no lesions, without obvious abnormality.  Normal external eye and conjunctiva.  Normal TM's bilaterally.  Normal auditory canals and external ears. Normal external nose, mucus membranes and septum.  Normal pharynx. Cardiovascular- S1, S2 normal, pulses palpable throughout   Lungs- chest clear, no wheezing, rales, normal symmetric air entry Abdomen- soft, non-tender; bowel sounds normal; no masses,  no organomegaly Extremities- no edema Lymph-no adenopathy palpable Musculoskeletal-no joint tenderness, deformity or swelling Skin-warm and dry, no hyperpigmentation, vitiligo, or suspicious lesions  Neurological Examination   Mental Status: Alert.  Not willing to cooperate with examination.  Does not follow commands.  No speech.  Flat affect. Cranial Nerves: II: Discs unable to be visualized; Blinks to bilateral confrontation; Pupils equal, round, reactive to light and accommodation III,IV, VI: ptosis not present, extra-ocular motions intact bilaterally V,VII: face symmetric, facial light touch sensation normal bilaterally VIII: unable to test IX,X: unable to test XI: unable to test XII: unable to test Motor: Patient withdraws with lower extremities but does not lift off the bed.  Does not use upper extremities Sensory: Responds or grimaces to light stimuli throughout  Deep Tendon Reflexes: 3+ and symmetric throughout with sustained ankle clonus bilaterally Plantars: Right: equivocal   Left: equivocal Cerebellar: Unable to evaluate Gait: not tested due to safety concerns   Laboratory Studies:   Basic Metabolic Panel:  Recent Labs Lab 02/06/17 2231 02/07/17 1107 02/08/17 0425 02/10/17 0548 02/11/17 0845 02/12/17 0416  NA 140 138 138  --  135 139  K 3.1* 3.1* 3.4* 4.8 4.2 3.6  CL 115* 107 109  --  107 111   CO2 17* 21* 20*  --  19* 19*  GLUCOSE 66 68 89  --  92 103*  BUN 7 <5* <5*  --  9 9  CREATININE 0.85 0.98 0.90  --  1.53* 0.92  CALCIUM 7.5* 8.4* 8.2*  --  8.6* 8.1*  MG  --  1.5*  --  2.2  --   --     Liver Function Tests:  Recent Labs Lab 02/06/17 2231 02/12/17 0416  AST 20 15  ALT 8* 6*  ALKPHOS 40 57  BILITOT 0.9 0.8  PROT 6.5 6.4*  ALBUMIN 3.1* 2.9*   No results for input(s): LIPASE, AMYLASE in the last 168 hours. No results for input(s): AMMONIA in the last 168 hours.  CBC:  Recent Labs Lab 02/06/17 2231 02/07/17 1107 02/12/17 0416  WBC 4.8 5.8 6.7  HGB 11.4* 12.8 12.8  HCT 33.2* 38.7 37.3  MCV 99.0 99.5 98.2  PLT 251 306 249    Cardiac Enzymes: No results for input(s): CKTOTAL, CKMB, CKMBINDEX, TROPONINI in the last 168 hours.  BNP: Invalid input(s): POCBNP  CBG:  Recent Labs Lab 02/07/17 0124 02/11/17 0502  GLUCAP 147* 53    Microbiology: Results for orders placed or performed during the hospital encounter of 11/07/16  Blood culture (routine x 2)     Status: None   Collection Time: 11/07/16  3:20 AM  Result Value Ref Range Status   Specimen Description BLOOD LEFT ARM  Final   Special Requests   Final    BOTTLES DRAWN AEROBIC AND ANAEROBIC  ANA 15ML AER 9ML   Culture NO GROWTH 5 DAYS  Final   Report Status 11/12/2016 FINAL  Final  Blood culture (routine x 2)     Status: None   Collection Time: 11/07/16  3:21 AM  Result Value Ref Range Status   Specimen Description BLOOD  LEFT AC  Final   Special Requests   Final    BOTTLES DRAWN AEROBIC AND ANAEROBIC  ANA 10ML AER 11ML   Culture NO GROWTH 5 DAYS  Final   Report Status 11/12/2016 FINAL  Final  MRSA PCR Screening     Status: None   Collection Time: 11/07/16  6:19 AM  Result Value Ref Range Status   MRSA by PCR NEGATIVE NEGATIVE Final    Comment:        The GeneXpert MRSA Assay (FDA approved for NASAL specimens only), is one component of a comprehensive MRSA  colonization surveillance program. It is not intended to diagnose  MRSA infection nor to guide or monitor treatment for MRSA infections.     Coagulation Studies: No results for input(s): LABPROT, INR in the last 72 hours.  Urinalysis:  Recent Labs Lab 02/07/17 0139  COLORURINE YELLOW*  LABSPEC 1.027  PHURINE 5.0  GLUCOSEU 50*  HGBUR NEGATIVE  BILIRUBINUR NEGATIVE  KETONESUR 5*  PROTEINUR NEGATIVE  NITRITE NEGATIVE  LEUKOCYTESUR NEGATIVE    Lipid Panel:  No results found for: CHOL, TRIG, HDL, CHOLHDL, VLDL, LDLCALC  HgbA1C:  Lab Results  Component Value Date   HGBA1C 5.2 06/21/2016    Urine Drug Screen:     Component Value Date/Time   LABOPIA POSITIVE (A) 02/07/2017 2135   COCAINSCRNUR NONE DETECTED 02/07/2017 2135   LABBENZ NONE DETECTED 02/07/2017 2135   AMPHETMU NONE DETECTED 02/07/2017 2135   THCU POSITIVE (A) 02/07/2017 2135   LABBARB NONE DETECTED 02/07/2017 2135    Alcohol Level: No results for input(s): ETH in the last 168 hours.  Other results: EKG: normal sinus rhythm at 85 bpm.  Imaging: No results found.   Assessment/Plan: 56 year old female with a history of stage IV adenocarcinoma of the lung without brain metastasis who presents with failure to thrive.  Although patient with lung cancer suspect that her current presentation is multifactorial.  Thyroid abnormal with TSH of 107 and T4 of 0.25.  Patient also with a B1 of 34.9 which may be causing some degree of an encephalopathy.  Thyroid being addressed and per family patient is starting to eat some.  On appetite stimulants.  Patient will also require B1 supplementation.  Patient also with pathological reflexes.  Recent PET scan showed no spine involvement.   Recommendations: 1.  Thiamine '500mg'$  IV q 8 hours for 2 days then '250mg'$  IV for 5 days.  Patient to go on po supplementation after that time. 2.  Plain films of the cervical, lumbar and thoracic spines. 3.  Agree with addressing thyroid  abnormalities    Alexis Goodell, MD Neurology [phone] 02/13/2017, 2:50 PM

## 2017-02-13 NOTE — Progress Notes (Signed)
Daily Progress Note   Patient Name: Audrey Mcgee       Date: 02/13/2017 DOB: [birth year]  Age: 56 y.o. MRN#: [id number] Attending Physician: [name], MD Primary Care Physician: Donnie Coffin, MD Admit Date: 02/06/2017  Reason for Consultation/Follow-up: Establishing goals of care  Subjective: Patient awake and alert. She is sitting up in the chair. Per daughter, she has improvement in oral intake and eats more at night, which is her normal routine at home.   Family hopeful for continued improvement. Struggling whether to send her to SNF or take her home. Daughter becomes very tearful during our conversation. She confirms she will do "whatever it takes" to care for her mother. Family agreeable with palliative services to follow outpatient to provide extra support.   Length of Stay: 2  Current Medications: Scheduled Meds:  . enoxaparin (LOVENOX) injection  40 mg Subcutaneous Q24H  . feeding supplement  1 Container Oral BID BM  . feeding supplement (ENSURE ENLIVE)  237 mL Oral TID WC  . folic acid  1 mg Oral Daily  . ipratropium-albuterol  3 mL Nebulization Q4H  . levothyroxine  50 mcg Oral QAC breakfast  . losartan  100 mg Oral Daily  . mouth rinse  15 mL Mouth Rinse BID  . megestrol  400 mg Oral BID  . metoprolol tartrate  50 mg Oral BID  . naLOXone (NARCAN)  injection  0.4 mg Intravenous Once  . nystatin  5 mL Oral QID  . pantoprazole  40 mg Oral Daily  . senna  1 tablet Oral Daily  . thiamine injection  500 mg Intravenous Q8H  . tiotropium  18 mcg Inhalation Daily    Continuous Infusions:   PRN Meds: acetaminophen **OR** acetaminophen, benzonatate, lidocaine, ondansetron **OR** ondansetron (ZOFRAN) IV, oxyCODONE, sodium chloride  Physical Exam  Constitutional:  She appears ill.  HENT:  Head: Normocephalic and atraumatic.  Cardiovascular: An irregularly irregular rhythm present.  Pulmonary/Chest: Effort normal. No accessory muscle usage. No tachypnea. No respiratory distress. She has rhonchi.  Abdominal: Bowel sounds are normal. She exhibits distension. There is no tenderness.  Neurological: She is alert.  Skin: Skin is warm and dry.  Psychiatric: Her speech is delayed. She is inattentive.  Nursing note and vitals reviewed.          Vital Signs:  BP (!) 142/82   Pulse 86   Temp 97.5 F (36.4 C) (Oral)   Resp 20   Ht '5\' 4"'$  (1.626 m)   Wt 55.7 kg (122 lb 11.2 oz) Comment: bed scale  LMP 01/30/2008 Comment: 9 yrs ago  SpO2 98%   BMI 21.06 kg/m  SpO2: SpO2: 98 % O2 Device: O2 Device: Not Delivered O2 Flow Rate:    Intake/output summary:  No intake or output data in the 24 hours ending 02/13/17 1629 LBM: Last BM Date: 02/12/17 Baseline Weight: Weight: 49.9 kg (110 lb) Most recent weight: Weight: 55.7 kg (122 lb 11.2 oz) (bed scale)  Palliative Assessment/Data: PPS 30%   Flowsheet Rows   Flowsheet Row Most Recent Value  Intake Tab  Referral Department  Hospitalist  Unit at Time of Referral  Oncology Unit  Palliative Care Primary Diagnosis  Cancer  Date Notified  02/08/17  Palliative Care Type  New Palliative care  Reason for referral  Clarify Goals of Care  Date of Admission  02/06/17  Date first seen by Palliative Care  02/10/17  # of days IP prior to Palliative referral  2  Clinical Assessment  Palliative Performance Scale Score  30%  Psychosocial & Spiritual Assessment  Palliative Care Outcomes  Patient/Family meeting held?  Yes  Who was at the meeting?  patient, brother, two daughters  Palliative Care Outcomes  Clarified goals of care, Provided psychosocial or spiritual support, Counseled regarding hospice, ACP counseling assistance, Provided end of life care assistance      Patient Active Problem List   Diagnosis Date  Noted  . Altered mental status 02/12/2017  . Cannabis abuse 02/12/2017  . Nausea 02/12/2017  . Failure to thrive in adult 02/12/2017  . Hypokalemia 02/12/2017  . Generalized weakness 02/12/2017  . Lung cancer ([place]) 02/12/2017  . Hypothyroidism 02/11/2017  . Malignancy ([place])   . Palliative care by specialist   . Goals of care, counseling/discussion   . DNR (do not resuscitate) discussion   . Depressive disorder   . Dehydration 02/07/2017  . Weight loss 02/07/2017  . Encounter for antineoplastic chemotherapy 02/01/2017  . 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 Sepsis ([place]) 11/07/2016  . Protein-calorie malnutrition, severe 11/07/2016  . Acute respiratory distress 06/21/2016  . COPD exacerbation ([place]) 06/21/2016  . Sinus tachycardia 06/21/2016  . Recurrent aspiration bronchitis/pneumonia ([place]) 06/21/2016  . Dysphagia 06/21/2016  . Nausea and vomiting 06/21/2016  . Dental abscess 05/10/2016  . Swallowing difficulty 03/28/2016  . Hypomagnesemia 02/29/2016  . Hoarseness 02/18/2016  . Cancer related pain 07/08/2015  . Adenocarcinoma, lung ([place]) 03/26/2015  . 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 Ear ache 10/03/2014  . Abscess of buccal cavity 10/03/2014  . Lump in neck 10/03/2014  . Laryngeal pain 10/03/2014    Palliative Care Assessment & Plan   Patient Profile: 56 y.o. female  with past medical history of hypertension, thyroid disease, COPD, and lung cancer admitted on 02/06/2017 with weakness and poor oral intake. Patient followed by Dr. Mike Gip outpatient for metastatic poorly differentiated adenocarcinoma of lung. PET scan on 01/29/17 revealed abnormal adenopathy in neck, chest, and upper abdomen suspicious for metastatic lung cancer. Patient defers biopsy. Last chemo November 2017. In ED, chest xray negative. CT scan head negative for acute infarct or abnormality. Patient with dehydration and failure to thrive. Palliative medicine consultation  for goals of care.    Assessment: Altered mental status  Dehydration Hypothyroidism Stage IV adenocarcinoma of lung Depression Failure  to thrive  Recommendations/Plan:  Continue current medical interventions. Neurology has evaluated and offered recommendations.   When discharged, likely home with palliative services to follow.   PMT not at [facility] over the weekend but will follow up next week if still hospitalized.   Goals of Care and Additional Recommendations:  Limitations on Scope of Treatment: Full Scope Treatment  Code Status: FULL   Code Status Orders        Start     Ordered   02/07/17 1026  Full code  Continuous     02/07/17 1026    Code Status History    Date Active Date Inactive Code Status Order ID Comments User Context   11/07/2016  6:18 AM 11/11/2016  2:39 PM Full Code [id number]  Harrie Foreman, MD Inpatient   06/21/2016 11:52 AM 06/22/2016  3:07 PM Full Code [id number]  [name], MD Inpatient       Prognosis:   Unable to determine guarded  Discharge Planning:  To Be Determined likely home with home health/palliative services  Care plan was discussed with patient, family, RN, and Dr. Ether Griffins  Thank you for allowing the Palliative Medicine Team to assist in the care of this patient.   Time In/Out: 1400-1435   Total Time 56mn Prolonged Time Billed  no       Greater than 50%  of this time was spent counseling and coordinating care related to the above assessment and plan.  MIhor Dow FNP-C Palliative Medicine Team  Phone: [phone]: [phone] Please contact Palliative Medicine Team phone at [phone] questions and concerns.

## 2017-02-13 NOTE — Progress Notes (Signed)
[facility] Hematology/Oncology Progress Note  Date of admission: 02/06/2017  Hospital day:  02/13/2017  Chief Complaint: Audrey Mcgee is a 56 y.o. female with stage IV adenocarcinoma of the lung whop was admitted with general weakness, dehydration, and failure to thrive.  Subjective: The patient's daughter states that she has made improvement.  She talked some yesterday.  She ate some food (1st time in 2 weeks).  No further retching.  Unable to get out of bed and support her own weight.  Social History: The patient is accompanied by her oldest daughter today.  Her brother is her medical power of attorney.  Allergies:  Allergies  Allergen Reactions  . No Known Allergies     Scheduled Medications: . enoxaparin (LOVENOX) injection  40 mg Subcutaneous Q24H  . feeding supplement  1 Container Oral BID BM  . feeding supplement (ENSURE ENLIVE)  237 mL Oral TID WC  . folic acid  1 mg Oral Daily  . ipratropium-albuterol  3 mL Nebulization Q4H  . levothyroxine  50 mcg Oral QAC breakfast  . losartan  100 mg Oral Daily  . mouth rinse  15 mL Mouth Rinse BID  . megestrol  400 mg Oral BID  . metoprolol tartrate  50 mg Oral BID  . naLOXone (NARCAN)  injection  0.4 mg Intravenous Once  . nystatin  5 mL Oral QID  . pantoprazole  40 mg Oral Daily  . senna  1 tablet Oral Daily  . tiotropium  18 mcg Inhalation Daily    Review of Systems: Unable to assess as patient awake, but does not follow command.  Answers are per boyfriend. GENERAL: Profound fatigue, slightly improved.  Has not gotten out of bed.  No fevers.  Weight loss. PERFORMANCE STATUS (ECOG):  4 HEENT:  No apparent visual changes, runny nose, sore throat, mouth sores or tenderness. Lungs: No apparent shortness of breath or cough.  No hemoptysis. Cardiac:  No apparent chest pain, palpitations, orthopnea, or PND. GI:  Ate hamburger yesterday per daughter.  No nausea, vomiting, diarrhea, constipation, melena  or hematochezia. GU:  No apparent urgency, frequency, dysuria, or hematuria. Musculoskeletal:  No apparent bone or joint pain. Extremities:  No pain or swelling. Skin:  No rashes or skin changes. Neuro:  General weakness, continues.  No headache, numbness or weakness, balance or coordination issues. Endocrine:  No diabetes, thyroid issues, hot flashes or night sweats. Psych:  No apparent mood changes, depression or anxiety. Pain:  No focal pain. Review of systems:  All other systems reviewed and found to be negative.  Physical Exam: Blood pressure (!) 142/82, pulse 86, temperature 97.5 F (36.4 C), temperature source Oral, resp. rate 20, height 5' 4"  (1.626 m), weight 122 lb 11.2 oz (55.7 kg), last menstrual period 01/30/2008, SpO2 98 %.  GENERAL:  Chronically ill appearing woman awake grinding her teeth, but unable to follow commands or communicate meaningfully. MENTAL STATUS: Awake.  Unable to assess orientation. HEAD:  Brown disheveled hair.  Normocephalic, atraumatic, face symmetric, no Cushingoid features. EYES:  Eyes open.  Pupils equal round and reactive to light and accomodation ENT:  Oropharynx clear without lesion.  Tongue normal. Mucous membranes dry.  RESPIRATORY: Clear to auscultation without rales, wheezes or rhonchi. CARDIOVASCULAR:  Regular rate and rhythm without murmur, rub or gallop. ABDOMEN:  Soft, non-tender, with active bowel sounds, and no hepatosplenomegaly.  No masses. SKIN:  No rashes, ulcers or lesions. EXTREMITIES: Thin.  No edema, no skin discoloration or tenderness.  No palpable cords. LYMPH NODES: No palpable cervical, supraclavicular, axillary or inguinal adenopathy  NEUROLOGICAL:  Unable to follow commands.  Moves all 4 extremities.   Results for orders placed or performed during the hospital encounter of 02/06/17 (from the past 48 hour(s))  Comprehensive metabolic panel     Status: Abnormal   Collection Time: 02/12/17  4:16 AM  Result Value Ref Range    Sodium 139 135 - 145 mmol/L   Potassium 3.6 3.5 - 5.1 mmol/L   Chloride 111 101 - 111 mmol/L   CO2 19 (L) 22 - 32 mmol/L   Glucose, Bld 103 (H) 65 - 99 mg/dL   BUN 9 6 - 20 mg/dL   Creatinine, Ser 0.92 0.44 - 1.00 mg/dL   Calcium 8.1 (L) 8.9 - 10.3 mg/dL   Total Protein 6.4 (L) 6.5 - 8.1 g/dL   Albumin 2.9 (L) 3.5 - 5.0 g/dL   AST 15 15 - 41 U/L   ALT 6 (L) 14 - 54 U/L   Alkaline Phosphatase 57 38 - 126 U/L   Total Bilirubin 0.8 0.3 - 1.2 mg/dL   GFR calc non Af Amer >60 >60 mL/min   GFR calc Af Amer >60 >60 mL/min    Comment: (NOTE) The eGFR has been calculated using the CKD EPI equation. This calculation has not been validated in all clinical situations. eGFR's persistently <60 mL/min signify possible Chronic Kidney Disease.    Anion gap 9 5 - 15  CBC     Status: Abnormal   Collection Time: 02/12/17  4:16 AM  Result Value Ref Range   WBC 6.7 3.6 - 11.0 K/uL   RBC 3.80 3.80 - 5.20 MIL/uL   Hemoglobin 12.8 12.0 - 16.0 g/dL   HCT 37.3 35.0 - 47.0 %   MCV 98.2 80.0 - 100.0 fL   MCH 33.6 26.0 - 34.0 pg   MCHC 34.2 32.0 - 36.0 g/dL   RDW 14.7 (H) 11.5 - 14.5 %   Platelets 249 150 - 440 K/uL  TSH     Status: Abnormal   Collection Time: 02/13/17  3:59 AM  Result Value Ref Range   TSH 50.000 (H) 0.350 - 4.500 uIU/mL    Comment: Performed by a 3rd Generation assay with a functional sensitivity of <=0.01 uIU/mL.  T4, free     Status: Abnormal   Collection Time: 02/13/17  3:59 AM  Result Value Ref Range   Free T4 0.40 (L) 0.61 - 1.12 ng/dL    Comment: (NOTE) Biotin ingestion may interfere with free T4 tests. If the results are inconsistent with the TSH level, previous test results, or the clinical presentation, then consider biotin interference. If needed, order repeat testing after stopping biotin.    No results found.  Assessment:  Audrey Mcgee is a 56 y.o. female with metastatic poorly differentiated lung cancer.  She was diagnosed in 09/2014.  She received  concurrent chemotherapy and radiation followed by maintenance [facility] (last 10/31/2016).  She has not received further chemotherapy secondary to missed appointments.  She underwent PET scan on 01/29/2017 for restaging.  Imaging revealed abnormal small volume hypermetabolic adenopathy in the neck, chest, and upper abdomen with probable involvement of the left adrenal gland, suspicious for recurrent metastatic lung cancer.  There was stable sub solid nodularity in the left upper lobe with a new partially solid 7 mm nodule in the left lower lobe.  She declined cervical biopsy to confirm disease represented her lung cancer rather than a second  malignancy (lymphoma).  She was seen int the medical oncology clinic on 02/06/2017 with nausea and vomiting.  She was weak and fatigued.  She had lost 14 pounds in 6 weeks.  She received IVF then left the clinic.  She returned to the ER with ongoing weakness.  Head MRI on 02/10/2017 revealed no evidence of metastatic disease.  She has a history of hyperthyroidism.  Thyroid function studies on 02/08/2017 revealed a TSH of 77 (high) and a free T4 of < 0.25 (low).  Plan: 1.  Oncology:  Patient has low volume metastatic disease.  No evidence of CNS disease.  Etiology of failure to thrive unlikely secondary to her malignancy.  Ideally, would like to confirm PET + adenopathy reveals non-small cell lung cancer (rather than lymphoma), but patient previously declined.  She has adamantly stated on several occasions that she wishes to continue chemotherapy.  At last meeting, she was agreeable to lymph node biopsy if her nodes grew on therapy.  She is not a candidate for chemotherapy until her performance status improves.  2.  Endocrinology:  Patient with a history of hyperthyroidism, but labs indicate severe hypothyroidism.  No evidence of myxedema coma (hypotension, bradycardia, hyponatremia, hypoglycemia).  Temperature slightly low.  She has manifested confusion at times and  ongoing lethargy.  Consider phone consultation with endocrinology. Cortisol level normal.  Patient on levothyroxine.  3.  Neurology:  Functional status remains poor.  No obvious seizure activity.  Neurology consultation.  4.  Nutrition:  Weight loss of greater than 10% in past 6 weeks.  Dietary consult.  B1 level low.  Supplement per neurology.  Check B12 and folate normal (likley due to previous supplementation with chemotherapy).  Consider HIV testing.  5.  Disposition:  Code status remains full.  Per boyfriend, her brother has medical power of attorney (unclear if she has paperwork documenting).  Patient unable to care for herself.  She needs skilled nursing when she is discharged.   Lequita Asal, MD  02/13/2017, 1:55 PM

## 2017-02-13 NOTE — Evaluation (Signed)
Physical Therapy Evaluation Patient Details Name: Audrey Mcgee MRN: [id number] DOB: February 01, 1961 Today's Date: 02/13/2017   History of Present Illness  Pt is a 56 y.o. female presenting to hospital with weakness, nausea, dizziness, lightheadedness, and confusion.  Pt admitted to hospital with AMS, failure to thrive, hypokalemia, and dehydration.  PMH includes COPD, metastatic stage 4 lung CA, htn, h/o chemotherapy.  Clinical Impression  Pt demonstrates significant UE and LE weakness and requiring significant assist to transfer bed to chair.  Pt unable to stand with use of RW (with 1 assist) and required significant assist for stand pivot instead.  Pt would benefit from skilled PT to address noted impairments and functional limitations.  Pt appears motivated to participate in therapy and is requiring significant increased assist levels from baseline.  Recommend pt discharge to STR when medically appropriate.    Follow Up Recommendations SNF    Equipment Recommendations  Wheelchair (measurements PT);Wheelchair cushion (measurements PT);3in1 (PT)    Recommendations for Other Services       Precautions / Restrictions Precautions Precautions: Fall Restrictions Weight Bearing Restrictions: No      Mobility  Bed Mobility Overal bed mobility: Needs Assistance Bed Mobility: Supine to Sit;Rolling;Sit to Supine Rolling: Mod assist (rolling to L and R for clean-up)   Supine to sit: Min guard;Mod assist;Max assist Sit to supine: Mod assist (assist for trunk)   General bed mobility comments: 1st trial pt sat up with HOB elevated with CGA (mod assist for sitting balance once up) but d/t urinary incontinence assisted pt back laying down for clean up; 2nd trial pt appearing more fatigued and required mod to max assist supine to sit (assist for trunk and B LE's); vc's for technique required  Transfers Overall transfer level: Needs assistance Equipment used: Rolling walker (2  wheeled);None Transfers: Sit to/from [facility] Sit to Stand: Max assist Stand pivot transfers: Max assist       General transfer comment: unable to clear pt's bottom from bed standing with max assist of therapist and use of RW; able to stand 2x's no AD with therapist in front of pt blocking pt's B knees; stand step turn bed to recliner with increased time and cueing for pt to take steps and L knee blocked  Ambulation/Gait             General Gait Details: not appropriate at this time d/t pt's assist levels with transfers  Stairs            Wheelchair Mobility    Modified Rankin (Stroke Patients Only)       Balance Overall balance assessment: Needs assistance Sitting-balance support: Bilateral upper extremity supported;Feet supported Sitting balance-Leahy Scale: Poor Sitting balance - Comments: initially mod assist sitting balance but with cueing and repositioning improved to CGA                                     Pertinent Vitals/Pain Pain Assessment: Faces Faces Pain Scale: No hurt Pain Intervention(s): Limited activity within patient's tolerance;Monitored during session  Vitals (HR and O2 on room air) stable and WFL throughout treatment session.    Home Living Family/patient expects to be discharged to:: Private residence Living Arrangements: Spouse/significant other (boyfriend) Available Help at Discharge: Family Type of Home: House Home Access: Stairs to enter Entrance Stairs-Rails: Left Entrance Stairs-Number of Steps: 2 Home Layout: One level Home Equipment: None  Prior Function Level of Independence: Needs assistance   Gait / Transfers Assistance Needed: Ambulating without AD but h/o falls in past 12 months  ADL's / Homemaking Assistance Needed: Independent with ADL's; family assists with IADL's        Hand Dominance        Extremity/Trunk Assessment   Upper Extremity Assessment Upper Extremity  Assessment: Generalized weakness    Lower Extremity Assessment Lower Extremity Assessment: Generalized weakness       Communication   Communication: Other (comment) (difficult to understand at times)  Cognition Arousal/Alertness:  (Initially lethargic but woke up with session's activities) Behavior During Therapy: Flat affect Overall Cognitive Status: Difficult to assess                      General Comments General comments (skin integrity, edema, etc.): Pt's daughter left during beginning of session; pt's boyfriend present most of session.  Nursing cleared pt for participation in physical therapy.  Pt and pt's family agreeable to PT session.    Exercises  Bed mobility and transfer training.   Assessment/Plan    PT Assessment Patient needs continued PT services  PT Problem List Decreased strength;Decreased activity tolerance;Decreased balance;Decreased mobility;Decreased knowledge of use of DME       PT Treatment Interventions DME instruction;Gait training;Stair training;Functional mobility training;Therapeutic activities;Therapeutic exercise;Balance training;Patient/family education    PT Goals (Current goals can be found in the Care Plan section)  Acute Rehab PT Goals Patient Stated Goal: to get stronger PT Goal Formulation: With patient Time For Goal Achievement: 02/27/17 Potential to Achieve Goals: Fair    Frequency Min 2X/week   Barriers to discharge Decreased caregiver support      Co-evaluation               End of Session Equipment Utilized During Treatment: Gait belt Activity Tolerance: Patient limited by fatigue Patient left: in chair;with call bell/phone within reach;with chair alarm set;with family/visitor present Nurse Communication: Mobility status;Precautions PT Visit Diagnosis: Muscle weakness (generalized) (M62.81);History of falling (Z91.81);Other abnormalities of gait and mobility (R26.89)         Time: 3013-1438 PT Time  Calculation (min) (ACUTE ONLY): 38 min   Charges:   PT Evaluation $PT Eval Low Complexity: 1 Procedure PT Treatments $Therapeutic Activity: 23-37 mins   PT G CodesLeitha Bleak, PT 02/13/17, 11:43 AM [phone]

## 2017-02-13 NOTE — NC FL2 (Signed)
[place] LEVEL OF CARE SCREENING TOOL     IDENTIFICATION  Patient Name: Audrey Mcgee Birthdate: 09/16/1961 Sex: female Admission Date (Current Location): 02/06/2017  [place] and [place] Number:  Engineering geologist and Address:  [facility], [address], [place], [place] [address]      Provider Number: [id number]  Attending Physician Name and Address:  [name], MD  Relative Name and Phone Number:       Current Level of Care: Hospital Recommended Level of Care: [place] Prior Approval Number:    Date Approved/Denied:   PASRR Number: [id number]  Discharge Plan: SNF    Current Diagnoses: Patient Active Problem List   Diagnosis Date Noted  . Altered mental status 02/12/2017  . Cannabis abuse 02/12/2017  . Nausea 02/12/2017  . Failure to thrive in adult 02/12/2017  . Hypokalemia 02/12/2017  . Generalized weakness 02/12/2017  . Lung cancer ([place]) 02/12/2017  . Hypothyroidism 02/11/2017  . Malignancy ([place])   . Palliative care by specialist   . Goals of care, counseling/discussion   . DNR (do not resuscitate) discussion   . Depressive disorder   . Dehydration 02/07/2017  . Weight loss 02/07/2017  . Encounter for antineoplastic chemotherapy 02/01/2017  . 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 Sepsis ([place]) 11/07/2016  . Protein-calorie malnutrition, severe 11/07/2016  . Acute respiratory distress 06/21/2016  . COPD exacerbation ([place]) 06/21/2016  . Sinus tachycardia 06/21/2016  . Recurrent aspiration bronchitis/pneumonia ([place]) 06/21/2016  . Dysphagia 06/21/2016  . Nausea and vomiting 06/21/2016  . Dental abscess 05/10/2016  . Swallowing difficulty 03/28/2016  . Hypomagnesemia 02/29/2016  . Hoarseness 02/18/2016  . Cancer related pain 07/08/2015  . Adenocarcinoma, lung ([place]) 03/26/2015  . 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 Ear ache 10/03/2014  . Abscess of  buccal cavity 10/03/2014  . Lump in neck 10/03/2014  . Laryngeal pain 10/03/2014    Orientation RESPIRATION BLADDER Height & Weight     Self, Time, Situation, Place  Normal Incontinent Weight: 122 lb 11.2 oz (55.7 kg) (bed scale) Height:  '5\' 4"'$  (162.6 cm)  BEHAVIORAL SYMPTOMS/MOOD NEUROLOGICAL BOWEL NUTRITION STATUS   (none)  (none) Incontinent Diet (soft)  AMBULATORY STATUS COMMUNICATION OF NEEDS Skin   Extensive Assist Verbally Normal                       Personal Care Assistance Level of Assistance  Dressing Bathing Assistance: Maximum assistance   Dressing Assistance: Maximum assistance     Functional Limitations Info             SPECIAL CARE FACTORS FREQUENCY  PT (By licensed PT)                    Contractures Contractures Info: Not present    Additional Factors Info  Code Status, Allergies Code Status Info: full Allergies Info: nka           Current Medications (02/13/2017):  This is the current hospital active medication list Current Facility-Administered Medications  Medication Dose Route Frequency Provider Last Rate Last Dose  . acetaminophen (TYLENOL) tablet 650 mg  650 mg Oral Q6H PRN Saundra Shelling, MD   650 mg at 02/13/17 0215   Or  . acetaminophen (TYLENOL) suppository 650 mg  650 mg Rectal Q6H PRN Saundra Shelling, MD      . benzonatate (TESSALON) capsule 100 mg  100 mg Oral TID PRN Pavan Pyreddy,  MD      . enoxaparin (LOVENOX) injection 40 mg  40 mg Subcutaneous Q24H Saundra Shelling, MD   40 mg at 02/12/17 2125  . feeding supplement (BOOST / RESOURCE BREEZE) liquid 1 Container  1 Container Oral BID BM [name], MD   1 Container at 02/12/17 0810  . feeding supplement (ENSURE ENLIVE) (ENSURE ENLIVE) liquid 237 mL  237 mL Oral TID WC [name], MD   237 mL at 02/13/17 0951  . folic acid (FOLVITE) tablet 1 mg  1 mg Oral Daily Loree Fee, RPH   1 mg at 02/13/17 0950  . ipratropium-albuterol (DUONEB) 0.5-2.5 (3) MG/3ML nebulizer  solution 3 mL  3 mL Nebulization Q4H [name], MD   3 mL at 02/13/17 0908  . levothyroxine (SYNTHROID, LEVOTHROID) tablet 50 mcg  50 mcg Oral QAC breakfast [name], MD   50 mcg at 02/13/17 1001  . lidocaine (XYLOCAINE) 2 % viscous mouth solution 10 mL  10 mL Mouth/Throat Q4H PRN Pavan Pyreddy, MD      . losartan (COZAAR) tablet 100 mg  100 mg Oral Daily Saundra Shelling, MD   100 mg at 02/13/17 0950  . MEDLINE mouth rinse  15 mL Mouth Rinse BID [name], MD   15 mL at 02/13/17 0957  . megestrol (MEGACE) 40 MG/ML suspension 400 mg  400 mg Oral BID Saundra Shelling, MD   400 mg at 02/12/17 2125  . metoprolol (LOPRESSOR) tablet 50 mg  50 mg Oral BID Alexis Hugelmeyer, DO   50 mg at 02/13/17 0950  . naloxone [facility]) injection 0.4 mg  0.4 mg Intravenous Once [name], MD      . nystatin (MYCOSTATIN) 100000 UNIT/ML suspension 500,000 Units  5 mL Oral QID [name], MD   500,000 Units at 02/12/17 2125  . ondansetron (ZOFRAN) tablet 4 mg  4 mg Oral Q6H PRN Saundra Shelling, MD   4 mg at 02/07/17 0920   Or  . ondansetron (ZOFRAN) injection 4 mg  4 mg Intravenous Q6H PRN Saundra Shelling, MD   4 mg at 02/10/17 0546  . oxyCODONE (Oxy IR/ROXICODONE) immediate release tablet 5 mg  5 mg Oral Q4H PRN [name], MD      . pantoprazole (PROTONIX) EC tablet 40 mg  40 mg Oral Daily Saundra Shelling, MD   40 mg at 02/13/17 0951  . senna (SENOKOT) tablet 8.6 mg  1 tablet Oral Daily [name], MD   8.6 mg at 02/13/17 0950  . sodium chloride 0.9 % bolus 1,000 mL  1,000 mL Intravenous PRN [name], MD      . tiotropium (SPIRIVA) inhalation capsule 18 mcg  18 mcg Inhalation Daily Saundra Shelling, MD   18 mcg at 02/12/17 0804     Discharge Medications: Please see discharge summary for a list of discharge medications.  Relevant Imaging Results:  Relevant Lab Results:   Additional Information ss: [id number]  Shela Leff, LCSW

## 2017-02-13 NOTE — [place] Notes (Signed)
[place] [facility] [place] NAME: Audrey Mcgee    MR#:  [id number]  DATE OF BIRTH:  Jul 03, 1961  SUBJECTIVE:  CHIEF COMPLAINT:   Chief Complaint  Patient presents with  . Weakness   The patient is 56 year old African-American female with past medical history significant for history of lung cancer, hypertension, thyroid disease, COPD, who presents to the hospital with complaints of generalized weakness, no energy, nausea, no vomiting, poor oral intake. In emergency room, she was felt to be dehydrated and was admitted. Tests were negative for flu and lactate was normal. Patient denied any abdominal discomfort, although admited of constipation for 3-4 days.  KUB in the hospital showed no significant abnormalities, including no constipation, ileus . Patient complained of dysphagia, food getting stuck in lower neck area, solid food as well as liquids. She was Seen by gastroenterologist, upper GI series was recommended as well as swallowing evaluation, however, was not done due to patient's decreased mental alertness. TSH came back high, methimazole was stopped, however. TSH remained high, Synthroid was initiated to some improvement of TSH as well as free T4. Urine drug screen was positive for opiates as well as cannabinoids. The patient was seen by psychiatrist , initiated on Remeron, oral intake remains poor,. Palliative care met with patient's brother and the patient herself yesterday, recommended palliative care. Follow up in the facility. Patient was seen by physical therapist, skilled nursing facility for rehabilitation was recommended. The patient was seen by neurologist, time, and level was checked, was found to be low, recommended supplementation intravenously for 7 days before she is discharged home or to skilled nursing facility.  Review of Systems  Unable to perform ROS: Mental acuity    VITAL SIGNS: Blood pressure (!) 142/82, pulse 86,  temperature 97.5 F (36.4 C), temperature source Oral, resp. rate 20, height 5' 4"  (1.626 m), weight 55.7 kg (122 lb 11.2 oz), last menstrual period 01/30/2008, SpO2 98 %.  PHYSICAL EXAMINATION:   GENERAL:  56 y.o.-year-old patient lying in the bed with no acute distress. Slow to respond, comfortable, somnolent, opens her eyes, does not converse much just yes and no EYES: Pupils equal, round, reactive to light and accommodation. No scleral icterus. Extraocular muscles intact.  HEENT: Head atraumatic, normocephalic. Oropharynx and nasopharynx clear.  NECK:  Supple, no jugular venous distention. No thyroid enlargement, no tenderness.  LUNGS: Normal breath sounds bilaterally, no wheezing, rales,rhonchi or crepitation. No use of accessory muscles of respiration.  CARDIOVASCULAR: S1, S2 normal. No murmurs, rubs, or gallops.  ABDOMEN: Soft, nontender, nondistended. Bowel sounds present. No organomegaly or mass.  EXTREMITIES: No pedal edema, cyanosis, or clubbing.  NEUROLOGIC: Cranial nerves II through XII are grossly intact. Muscle strength 5/5 in all extremities. Sensation intact. Gait not checked.  PSYCHIATRIC: The patient remains very somnolent, slow to respond SKIN: No obvious rash, lesion, or ulcer.   ORDERS/RESULTS REVIEWED:   CBC  Recent Labs Lab 02/06/17 2231 02/07/17 1107 02/12/17 0416  WBC 4.8 5.8 6.7  HGB 11.4* 12.8 12.8  HCT 33.2* 38.7 37.3  PLT 251 306 249  MCV 99.0 99.5 98.2  MCH 34.1* 33.0 33.6  MCHC 34.5 33.2 34.2  RDW 14.3 14.0 14.7*   ------------------------------------------------------------------------------------------------------------------  Chemistries   Recent Labs Lab 02/06/17 2231 02/07/17 1107 02/08/17 0425 02/10/17 0548 02/11/17 0845 02/12/17 0416  NA 140 138 138  --  135 139  K 3.1* 3.1* 3.4* 4.8 4.2 3.6  CL 115* 107 109  --  107 111  CO2 17* 21* 20*  --  19* 19*  GLUCOSE 66 68 89  --  92 103*  BUN 7 <5* <5*  --  9 9  CREATININE 0.85 0.98  0.90  --  1.53* 0.92  CALCIUM 7.5* 8.4* 8.2*  --  8.6* 8.1*  MG  --  1.5*  --  2.2  --   --   AST 20  --   --   --   --  15  ALT 8*  --   --   --   --  6*  ALKPHOS 40  --   --   --   --  57  BILITOT 0.9  --   --   --   --  0.8   ------------------------------------------------------------------------------------------------------------------ estimated creatinine clearance is 61.1 mL/min (by C-G formula based on SCr of 0.92 mg/dL). ------------------------------------------------------------------------------------------------------------------  Recent Labs  02/13/17 0359  TSH 50.000*    Cardiac Enzymes No results for input(s): CKMB, TROPONINI, MYOGLOBIN in the last 168 hours.  Invalid input(s): CK ------------------------------------------------------------------------------------------------------------------ Invalid input(s): POCBNP ---------------------------------------------------------------------------------------------------------------  RADIOLOGY: No results found.  EKG:  Orders placed or performed during the hospital encounter of 02/06/17  . ED EKG  . ED EKG  . EKG    ASSESSMENT AND PLAN:  Active Problems:   Dehydration   Hypothyroidism   Malignancy (HCC)   Altered mental status   Cannabis abuse   Failure to thrive in adult   Depressive disorder   Nausea   Generalized weakness   Palliative care by specialist   Goals of care, counseling/discussion   DNR (do not resuscitate) discussion   Hypokalemia   Lung cancer ([place])  #1. Altered mental state , concerning for profound hypothyroidinsm, Continue patient on Synthroid, some improved mental acuity. Symptoms are likely multifactorial, due to marijuana abuse, opiates, clinical hypothyroidism, possible underlying depression. Appreciate psychiatrist  input. The patient underwent brain MRI to rule out metastasis, no metastases were found, but atrophy. Patient was seen by neurologist today, recommended thiamine  supplementation for 7 days intravenously  #2. Intermittent nausea and vomiting,  continue patient on  Protonix, Zofran,  KUB was negative for constipation, no barium swallowing was performed,  appreciate gastroenterologist input,  palliative care met with the patient and patient's brother, POA , her medical care, palliative care follow up in skilled nursing facility was recommended versus hospice home placement. Not noted to have vomiting in the hospital, however oral intake remains minimal. Has turned neurologist consultation is requested for possible PEG tube placement, if oral intake does not improve with conservative therapy, if patient is a candidate for PEG tube placement due to metastatic lung cancer.  #3. Failure to thrive adult, continue patient on Megace, Protonix, appreciate oncologist input, oral intake remains poor on Remeron, no barium swallowing study was performed so far due to mental status #4. Hypokalemia, supplemented orally, magnesium level was low, supplemented intravenously and orally, normal potassium and magnesium now #5 hypothyroidism, with history of hyperthyroidism, now off methimazole, TSH has improved to 50, free T4 is is better at 0.4, continue Synthroid follow clinically. Cause of the patient's prior thyroid disease is unclear, but obviously methimazole is not needed at this time. #6  stage IV adenocarcinoma of the lung, patient was seen by oncologist during this admission,, it was noted that patient has not received any treatment for lung cancer since November 2017, she refused biopsy of the lymph node in the past. Dr. Mike Gip saw patient in consultation today, she  did not feel that failure to thrive etiology was related to her malignancy.  Palliative care discussed with patient and the brother , that oncologist may not initiate chemotherapy if her oral intake remains low . She was instructed by oncologist to follow up with Dr. Mike Gip on 02/09/2017 for further evaluation and  consideration of chemotherapy. Brain MRI is negative for metastasis, but atrophy , bone scan 01/29/2017 did not show abnormalities in brain/skull. At last meeting with Dr. Mike Gip the patient was agreeable for lymph node biopsy #7. Generalized weakness, physical therapist evaluated patient and recommended skilled nursing facility placement for rehabilitation, discharge planning is underway, patient may not be able to participate in skilled nursing facility. Physical therapy requirements, if her oral intake does not improve, she may benefit from hospice home placement   #8. Thiamine deficiency, initiated on thiamine, recommended intravenous supplementation for 7 days prior to leaving hospital  Management plans discussed with the patient, her daughter, all questions were answered  DRUG ALLERGIES:  Allergies  Allergen Reactions  . No Known Allergies     CODE STATUS:     Code Status Orders        Start     Ordered   02/07/17 1026  Full code  Continuous     02/07/17 1026    Code Status History    Date Active Date Inactive Code Status Order ID Comments User Context   11/07/2016  6:18 AM 11/11/2016  2:39 PM Full Code [id number]  Harrie Foreman, MD Inpatient   06/21/2016 11:52 AM 06/22/2016  3:07 PM Full Code [id number]  [name], MD Inpatient      TOTAL TIME TAKING CARE OF THIS PATIENT: 40 minutes.  Discussed with patient's daughter, updated, all questions were answered Discussed with Dr. Tomie China M.D on 02/13/2017 at 3:39 PM  Between 7am to 6pm - Pager - [phone]  After 6pm go to [email or web address] - password [email or web address] [facility]  Office  [phone]  CC: Primary care physician; Donnie Coffin, MD

## 2017-02-13 NOTE — Clinical Social Work Note (Signed)
Clinical Social Work Assessment  Patient Details  Name: Audrey Mcgee MRN: [id number] Date of Birth: May 05, 1961  Date of referral:  02/13/17               Reason for consult:  Facility Placement, Discharge Planning                Permission sought to share information with:  Family Supports Permission granted to share information::  Yes, Verbal Permission Granted  Name::     Tona Sensing  Relationship::  brother  Contact Information:  [phone]  Housing/Transportation Living arrangements for the past 2 months:  [place] of Information:  Adult Children Patient Interpreter Needed:  None Criminal Activity/Legal Involvement Pertinent to Current Situation/Hospitalization:  No - Comment as needed Significant Relationships:  Significant Other, Adult Children Lives with:  Significant Other Do you feel safe going back to the place where you live?  No Need for family participation in patient care:  No (Coment)  Care giving concerns:  Pt has supportive family.   Social Worker assessment / plan:  CSW mett with pt's family to address consult for placement. CSW introduced herself and explained role of social work. CSW also explained process of discharging to SNF with Medicaid. Pt's daughter shared that she would like to take pt home with services rather than SNF placement. CSW udpated RNCM. Per Palliative Care NP, Palliative Care will follow at home. CSW provided support counseling. CSW will continue to follow.   Employment status:  Disabled (Comment on whether or not currently receiving Disability) Insurance information:  Managed Medicare PT Recommendations:  [place] / Referral to community resources:  [place]  Patient/Family's Response to care:  Pt's daughter was Patent attorney of CSW support.   Patient/Family's Understanding of and Emotional Response to Diagnosis, Current Treatment, and Prognosis:  Pt's daughter  understands that pt will need support at home.   Emotional Assessment Appearance:  Appears stated age Attitude/Demeanor/Rapport:  Other Affect (typically observed):  Other Orientation:  Oriented to Self Alcohol / Substance use:  Not Applicable Psych involvement (Current and /or in the community):  No (Comment)  Discharge Needs  Concerns to be addressed:  Adjustment to Illness Readmission within the last 30 days:  No Current discharge risk:  Chronically ill Barriers to Discharge:  Continued Medical Work up   [employer], LCSW 02/13/2017, 5:05 PM

## 2017-02-13 NOTE — Discharge Planning (Signed)
S/w family (daughter). Understands patient needs to be DC today and therefore family needs to work together to get house ready.  Dr. Yetta Numbers on DC for evening time, after patient is encouraged to eat "well" and work with PT.  Family requested possibility  of DC 2/24, but was told she needs to go today. Family understands!

## 2017-02-14 LAB — BASIC METABOLIC PANEL
ANION GAP: 8 (ref 5–15)
BUN: 6 mg/dL (ref 6–20)
CO2: 23 mmol/L (ref 22–32)
Calcium: 9.4 mg/dL (ref 8.9–10.3)
Chloride: 106 mmol/L (ref 101–111)
Creatinine, Ser: 0.65 mg/dL (ref 0.44–1.00)
GFR calc Af Amer: >60 mL/min (ref >60)
GFR calc non Af Amer: >60 mL/min (ref >60)
GLUCOSE: 99 mg/dL (ref 65–99)
POTASSIUM: 2.9 mmol/L — AB (ref 3.5–5.1)
Sodium: 137 mmol/L (ref 135–145)

## 2017-02-14 MED ORDER — POTASSIUM CHLORIDE CRYS ER 20 MEQ PO TBCR
40.0000 meq | EXTENDED_RELEASE_TABLET | Freq: Once | ORAL | Status: AC
  Administered 2017-02-14: 11:00:00 40 meq via ORAL
  Filled 2017-02-14: qty 2

## 2017-02-14 MED ORDER — PANTOPRAZOLE SODIUM 40 MG PO PACK
40.0000 mg | PACK | Freq: Every day | ORAL | Status: DC
  Administered 2017-02-15 – 2017-02-21 (×5): 40 mg via ORAL
  Filled 2017-02-14 (×8): qty 20

## 2017-02-14 MED ORDER — IPRATROPIUM-ALBUTEROL 0.5-2.5 (3) MG/3ML IN SOLN: 3.0000 mL | RESPIRATORY_TRACT | Status: DC | PRN

## 2017-02-14 MED ORDER — POTASSIUM CHLORIDE CRYS ER 20 MEQ PO TBCR
20.0000 meq | EXTENDED_RELEASE_TABLET | Freq: Two times a day (BID) | ORAL | Status: AC
  Administered 2017-02-14 – 2017-02-15 (×2): 20 meq via ORAL
  Filled 2017-02-14 (×4): qty 1

## 2017-02-14 NOTE — Progress Notes (Addendum)
[facility] [place] NAME: Audrey Mcgee    MR#:  [id number]  DATE OF BIRTH:  May 04, 1961  SUBJECTIVE:   Patient is here due to altered mental status and noted to be severely hypothyroid.  Mental status improving.  Family at bedside and asking if we can advance her diet.    REVIEW OF SYSTEMS:    Review of Systems  Constitutional: Negative for chills and fever.  HENT: Negative for congestion and tinnitus.   Eyes: Negative for blurred vision and double vision.  Respiratory: Negative for cough, shortness of breath and wheezing.   Cardiovascular: Negative for chest pain, orthopnea and PND.  Gastrointestinal: Negative for abdominal pain, diarrhea, nausea and vomiting.  Genitourinary: Negative for dysuria and hematuria.  Neurological: Positive for weakness. Negative for dizziness, sensory change and focal weakness.  All other systems reviewed and are negative.   Nutrition: Regular Tolerating Diet: Yes Tolerating PT: Eval noted.   DRUG ALLERGIES:   Allergies  Allergen Reactions  . No Known Allergies     VITALS:  Blood pressure (!) 135/91, pulse (!) 101, temperature 98.2 F (36.8 C), temperature source Oral, resp. rate 16, height '5\' 4"'$  (1.626 m), weight 55.7 kg (122 lb 11.2 oz), last menstrual period 01/30/2008, SpO2 99 %.  PHYSICAL EXAMINATION:   Physical Exam  GENERAL:  56 y.o.-year-old patient lying in the bed Lethargic but in NAD.    EYES: Pupils equal, round, reactive to light and accommodation. No scleral icterus. Extraocular muscles intact.  HEENT: Head atraumatic, normocephalic. Oropharynx and nasopharynx clear.  NECK:  Supple, no jugular venous distention. No thyroid enlargement, no tenderness.  LUNGS: Normal breath sounds bilaterally, no wheezing, rales, rhonchi. No use of accessory muscles of respiration.  CARDIOVASCULAR: S1, S2 normal. No murmurs, rubs, or gallops.  ABDOMEN: Soft, nontender, nondistended. Bowel sounds  present. No organomegaly or mass.  EXTREMITIES: No cyanosis, clubbing or edema b/l.    NEUROLOGIC: Cranial nerves II through XII are intact. No focal Motor or sensory deficits b/l.  Globally weak.  PSYCHIATRIC: The patient is alert and oriented x 2.  SKIN: No obvious rash, lesion, or ulcer.    LABORATORY PANEL:   CBC  Recent Labs Lab 02/12/17 0416  WBC 6.7  HGB 12.8  HCT 37.3  PLT 249   ------------------------------------------------------------------------------------------------------------------  Chemistries   Recent Labs Lab 02/10/17 0548  02/12/17 0416 02/14/17 0718  NA  --   < > 139 137  K 4.8  < > 3.6 2.9*  CL  --   < > 111 106  CO2  --   < > 19* 23  GLUCOSE  --   < > 103* 99  BUN  --   < > 9 6  CREATININE  --   < > 0.92 0.65  CALCIUM  --   < > 8.1* 9.4  MG 2.2  --   --   --   AST  --   --  15  --   ALT  --   --  6*  --   ALKPHOS  --   --  57  --   BILITOT  --   --  0.8  --   < > = values in this interval not displayed. ------------------------------------------------------------------------------------------------------------------  Cardiac Enzymes No results for input(s): TROPONINI in the last 168 hours. ------------------------------------------------------------------------------------------------------------------  RADIOLOGY:  Dg Cervical Spine 2 Or 3 Views  Result Date: 02/13/2017 CLINICAL DATA:  Lower extremity weakness since June of  last year. No injury. EXAM: CERVICAL SPINE - 2-3 VIEW COMPARISON:  None. FINDINGS: There is no evidence of cervical spine fracture or prevertebral soft tissue swelling. Alignment is normal. No other significant bone abnormalities are identified. IMPRESSION: Negative cervical spine radiographs. Electronically Signed   By: Staci Righter M.D.   On: 02/13/2017 15:51   Dg Thoracic Spine 2 View  Result Date: 02/13/2017 CLINICAL DATA:  Lower extremity weakness 9 months. No injury. History of metastatic lung cancer. EXAM:  THORACIC SPINE 2 VIEWS COMPARISON:  Chest x-ray 02/06/2017 FINDINGS: Vertebral body alignment, heights and disc space heights are within normal. Minimal spondylosis of the thoracic spine. Pedicles are intact. No evidence of compression fracture or subluxation. Several air-filled bowel loops in the mid abdomen likely minimally dilated small bowel. IMPRESSION: Minimal spondylosis of the thoracic spine.  No acute findings. Several air-filled possibly dilated small bowel loops in the upper mid abdomen. Recommend clinical correlation as supine erect abdominal films may be helpful for further evaluation. Electronically Signed   By: Marin Olp M.D.   On: 02/13/2017 15:59   Dg Lumbar Spine 2-3 Views  Result Date: 02/13/2017 CLINICAL DATA:  Lower extremity weakness 8 months. No injury. History of metastatic lung cancer. EXAM: LUMBAR SPINE - 2-3 VIEW COMPARISON:  02/07/2017 and CT 02/04/2016 FINDINGS: Vertebral body alignment, heights and disc space heights are normal. There is mild spondylosis of the lumbar spine. Minimal facet arthropathy over the lower lumbar spine. No evidence of compression fracture or subluxation. Calcified plaque over the abdominal aorta. Multiple air-filled loops of large and small bowel are present. Multiple pelvic phleboliths unchanged. IMPRESSION: Mild spondylosis of the lumbar spine.  No acute findings. Aortic atherosclerosis. Electronically Signed   By: Marin Olp M.D.   On: 02/13/2017 15:54     ASSESSMENT AND PLAN:   56 year old female with past medical history of lung cancer, hypertension, hypothyroidism, COPD who presented to the hospital due to generalized weakness, poor by mouth intake and noted to have dysphagia and also noted to be severely hypothyroid.  1. Altered mental status-metabolic encephalopathy secondary to severe hypothyroidism. -Continue thyroid supplements, mental status improving. -Patient also noted to low vitamin B-1 levels and continue supplementation as  per neurology.  2. Hypothyroidism-cause of patient's encephalopathy, altered mental status. -Continue Synthroid. P TSH and thyroid function tests in a few weeks.  3. Intermittent N/V - etiology unclear but improved. Seen by GI and cont. Supportive care with Protonix, Zofran.  - no upper GI series done. GI recommended possible PEG placement but pt. Refused presently.  - cont. Supportive care for now.   4. Adult Failure to thrive - cont. Megace, supplements.   5. Stage IV Lung Cancer - MRI Brain (-) for metastatic disease.  - No treatment since Nov'17.  Cont. Follow up with Neurology as outpatient.   6. Generalized weakness - multifactorial and related to #2 & #5.  - seen by PT and they recommend SNF but pt. And family do not want SNF.  - will arrange home health prior to discharge.  Pt's family is requesting equipment at home like hospital bed, bedside commode, wheelchair and will make CM aware.   7. Thiamine Deficiency - cont. IV Thiamine.  Appreciate neuro input.    8. Hypokalemia - will place on Oral potassium supplements and repeat level in a.m.  - check Mg. Level in a.m.    All the records are reviewed and case discussed with Care Management/Social Worker. Management plans discussed with the patient, family  and they are in agreement.  CODE STATUS: Full Code  DVT Prophylaxis: Lovenox  TOTAL TIME TAKING CARE OF THIS PATIENT: 30 minutes.   POSSIBLE D/C IN 2-3 DAYS, DEPENDING ON CLINICAL CONDITION.   [name] M.D on 02/14/2017 at 12:06 PM  Between 7am to 6pm - Pager - [phone]  After 6pm go to [email or web address] - Proofreader  [facility]  Office  [phone]  CC: Primary care physician; Donnie Coffin, MD

## 2017-02-14 NOTE — Progress Notes (Addendum)
Pt has not put out any urine today. I performed bladder scan and pt's bladder has 138m of urine in bladder. Dr. SVerdell Carminenotified. Per his order will encourage pt to drink more fluids.   Update 1650: Pt voided 3533m of urine

## 2017-02-14 NOTE — Progress Notes (Signed)
Pt's potassium level is low at 2.9 this AM. Dr. Verdell Carmine notified. He placed orders for PO supplementation. I called to verify the amount that he wants the pt to receive this AM. Per Dr. Verdell Carmine the pt needs a total of 4mq now and then an additional 255m tonight.

## 2017-02-14 NOTE — Plan of Care (Signed)
Problem: Fluid Volume: Goal: Ability to maintain a balanced intake and output will improve Outcome: Progressing Daughter is at bedside, encouraging patient to eat. Choices offered, fair intake this shift.   Problem: Nutrition: Goal: Adequate nutrition will be maintained Outcome: Progressing Daughter is at bedside, encouraging patient to eat. Choices offered, fair intake this shift.

## 2017-02-14 NOTE — Consult Note (Addendum)
Lucilla Lame, MD [address] [place], [place] [address] Phone: [phone] Fax : [phone]  Consultation  Referring Provider:     No ref. provider found Primary Care Physician:  Donnie Coffin, MD Primary Gastroenterologist:  unknown    Reason for Consultation:   Failure to thrive, assess for G-tube   Date of Admission:  02/06/2017 Date of Consultation:  02/14/2017         HPI:   Audrey Mcgee is a 56 y.o. female with stage 4 lung cancer, treatment naive, who was admitted with failure to thrive on 02/06/17. Gi, Dr Epimenio Foot was consulted initially on 2/18 who recommended UGI series, swallowing study by speech path. She is undergoing speech path evaluation today. Per family (daughetr and boy friend bedside), patient is not willing to have G-tube placement and she has been eating more. Per daughter, she had some kind of stomach surgery for tumor when she was young. She has been bloated lately. She is having regular brown bowels. Denies abdominal pain, n/v/dysphagia, f/c. Had significant weight loss and LOA. Her daughter reported that she has been hallucinating and having illusions. Patient is not a good historian, history obtained from her daughter who was bedside during my interview.   Past Medical History:  Diagnosis Date  . COPD (chronic obstructive pulmonary disease) ([facility])   . Hypertension   . Lung cancer ([facility])   . Thyroid disease     Past Surgical History:  Procedure Laterality Date  . CESAREAN SECTION CLASSICAL    . STOMACH SURGERY     Pt reports for a tumor    Prior to Admission medications   Medication Sig Start Date End Date Taking? Authorizing Provider  dexamethasone (DECADRON) 4 MG tablet Take '4mg'$  BID the day before and day after chemotherapy. Patient taking differently: Take 4 mg by mouth 2 (two) times daily. Take '4mg'$  BID the day before and day after chemotherapy. 10/31/16  Yes Lequita Asal, MD  folic acid (FOLVITE) 132 MCG tablet Take 800  mcg by mouth daily.    Yes Historical Provider, MD  losartan (COZAAR) 100 MG tablet Take 1 tablet (100 mg total) by mouth daily. 06/22/16  Yes [name], MD  magnesium oxide (MAG-OX) 400 MG tablet Take 1 tablet (400 mg total) by mouth daily. 04/17/16  Yes Lequita Asal, MD  megestrol (MEGACE) 40 MG/ML suspension Take 10 mLs (400 mg total) by mouth 2 (two) times daily. 02/02/17  Yes Lequita Asal, MD  methimazole (TAPAZOLE) 5 MG tablet take 1 tablet by mouth twice a day for OVERACTIVE THYROID 08/08/16  Yes Historical Provider, MD  metoprolol (LOPRESSOR) 50 MG tablet take 1 tablet by mouth twice a day NOTE DOSE INCREASE 08/07/16  Yes Historical Provider, MD  morphine (MS CONTIN) 15 MG 12 hr tablet Take 1 tablet (15 mg total) by mouth 2 (two) times daily. 02/02/17  Yes Lequita Asal, MD  omeprazole (PRILOSEC) 20 MG capsule Take 1 capsule (20 mg total) by mouth daily. 06/20/16  Yes Lequita Asal, MD  ondansetron (ZOFRAN) 8 MG tablet Take 1 tablet (8 mg total) by mouth 2 (two) times daily as needed for nausea or vomiting. 08/22/16  Yes Lequita Asal, MD  Oxycodone HCl 10 MG TABS Take 1 tablet (10 mg total) by mouth every 4 (four) hours as needed (pain). 02/02/17  Yes Lequita Asal, MD  potassium chloride (K-DUR) 10 MEQ tablet Take 1 tablet (10 mEq total) by mouth 2 (  two) times daily. For 3 days 02/06/17  Yes Lequita Asal, MD  tiotropium (SPIRIVA) 18 MCG inhalation capsule Place 1 capsule (18 mcg total) into inhaler and inhale daily. 06/22/16  Yes [name], MD  docusate sodium (COLACE) 100 MG capsule Take 100 mg by mouth 2 (two) times daily as needed for mild constipation.    Historical Provider, MD  feeding supplement (BOOST / RESOURCE BREEZE) LIQD Take 1 Container by mouth 2 (two) times daily between meals. 02/12/17   [name], MD  ipratropium-albuterol (DUONEB) 0.5-2.5 (3) MG/3ML SOLN Take 3 mLs by nebulization every 4 (four) hours. 02/12/17   [name],  MD  levothyroxine (SYNTHROID, LEVOTHROID) 50 MCG tablet Take 1 tablet (50 mcg total) by mouth daily before breakfast. 02/13/17   [name], MD  lidocaine (XYLOCAINE) 2 % solution Use as directed 10 mLs in the mouth or throat every 4 (four) hours as needed for mouth pain.    Historical Provider, MD  ondansetron (ZOFRAN) 4 MG tablet Take 1 tablet (4 mg total) by mouth every 6 (six) hours as needed for nausea. 02/12/17   [name], MD  predniSONE (DELTASONE) 20 MG tablet Take 2 tablets (40 mg total) by mouth daily with breakfast. Patient not taking: Reported on 02/07/2017 11/11/16   Hillary Bow, MD  senna (SENOKOT) 8.6 MG TABS tablet Take 1 tablet (8.6 mg total) by mouth daily. 02/13/17   [name], MD    Family History  Problem Relation Age of Onset  . Hypertension Father   . Cancer Father   . Diabetes Father   . Cancer Maternal Aunt      Social History  Substance Use Topics  . Smoking status: Current Every Day Smoker    Years: 15.00    Types: Cigarettes  . Smokeless tobacco: Never Used  . Alcohol use 1.2 oz/week    2 Cans of beer per week    Allergies as of 02/06/2017 - Review Complete 02/06/2017  Allergen Reaction Noted  . No known allergies  03/26/2015    Review of Systems:    All systems reviewed and negative except where noted in HPI.   Physical Exam:  Vital signs in last 24 hours: Temp:  [98.4 F (36.9 C)] 98.4 F (36.9 C) (02/23 2115) Pulse Rate:  [86-91] 91 (02/23 2115) Resp:  [18] 18 (02/23 2115) BP: (127-142)/(82-85) 127/85 (02/23 2115) SpO2:  [97 %-100 %] 97 % (02/24 0348) Last BM Date: 02/13/17 General:  Malnourished, ill appearing Head:  Normocephalic and atraumatic. Eyes:   No icterus.   Conjunctiva pink. PERRLA. Ears:  Normal auditory acuity. Neck:  Supple; no masses or thyroidomegaly Lungs: Respirations even and unlabored. Lungs clear to auscultation bilaterally.   No wheezes, crackles, or rhonchi.  Heart:  Regular rate and rhythm;  Without  murmur, clicks, rubs or gallops Abdomen:  Soft, distended, tympanic, nontender. Normal bowel sounds. No appreciable masses or hepatomegaly.  No rebound or guarding.  Rectal:  Not performed. Msk:  Symmetrical without gross deformities. Generalized waekness Extremities:  Without edema, cyanosis or clubbing. Neurologic:  Alert and oriented x1;   Skin:  Intact without significant lesions or rashes, dry skin Psych: delirious Alert and cooperative.  LAB RESULTS:  Recent Labs  02/12/17 0416  WBC 6.7  HGB 12.8  HCT 37.3  PLT 249   BMET  Recent Labs  02/11/17 0845 02/12/17 0416 02/14/17 0718  NA 135 139 137  K 4.2 3.6 2.9*  CL 107 111 106  CO2 19*  19* 23  GLUCOSE 92 103* 99  BUN '9 9 6  '$ CREATININE 1.53* 0.92 0.65  CALCIUM 8.6* 8.1* 9.4   LFT  Recent Labs  02/12/17 0416  PROT 6.4*  ALBUMIN 2.9*  AST 15  ALT 6*  ALKPHOS 57  BILITOT 0.8   PT/INR No results for input(s): LABPROT, INR in the last 72 hours.  STUDIES: Dg Cervical Spine 2 Or 3 Views  Result Date: 02/13/2017 CLINICAL DATA:  Lower extremity weakness since June of last year. No injury. EXAM: CERVICAL SPINE - 2-3 VIEW COMPARISON:  None. FINDINGS: There is no evidence of cervical spine fracture or prevertebral soft tissue swelling. Alignment is normal. No other significant bone abnormalities are identified. IMPRESSION: Negative cervical spine radiographs. Electronically Signed   By: Staci Righter M.D.   On: 02/13/2017 15:51   Dg Thoracic Spine 2 View  Result Date: 02/13/2017 CLINICAL DATA:  Lower extremity weakness 9 months. No injury. History of metastatic lung cancer. EXAM: THORACIC SPINE 2 VIEWS COMPARISON:  Chest x-ray 02/06/2017 FINDINGS: Vertebral body alignment, heights and disc space heights are within normal. Minimal spondylosis of the thoracic spine. Pedicles are intact. No evidence of compression fracture or subluxation. Several air-filled bowel loops in the mid abdomen likely minimally dilated small  bowel. IMPRESSION: Minimal spondylosis of the thoracic spine.  No acute findings. Several air-filled possibly dilated small bowel loops in the upper mid abdomen. Recommend clinical correlation as supine erect abdominal films may be helpful for further evaluation. Electronically Signed   By: Marin Olp M.D.   On: 02/13/2017 15:59   Dg Lumbar Spine 2-3 Views  Result Date: 02/13/2017 CLINICAL DATA:  Lower extremity weakness 8 months. No injury. History of metastatic lung cancer. EXAM: LUMBAR SPINE - 2-3 VIEW COMPARISON:  02/07/2017 and CT 02/04/2016 FINDINGS: Vertebral body alignment, heights and disc space heights are normal. There is mild spondylosis of the lumbar spine. Minimal facet arthropathy over the lower lumbar spine. No evidence of compression fracture or subluxation. Calcified plaque over the abdominal aorta. Multiple air-filled loops of large and small bowel are present. Multiple pelvic phleboliths unchanged. IMPRESSION: Mild spondylosis of the lumbar spine.  No acute findings. Aortic atherosclerosis. Electronically Signed   By: Marin Olp M.D.   On: 02/13/2017 15:54      Impression / Plan:   Audrey Mcgee is a 56 y.o. y/o female with metastatic lung cancer, not on chemotherapy, admitted with severe malnutrition. She is currently eating more and tolerating PO well. And, she is refusing PEG currently.   - Calorie count - Speech path evaluation - Upper GI series - Encourage PO - Call us back after above studies  Thank you for involving me in the care of this patient.      LOS: 3 days   Sherri Sear, MD  02/14/2017, 8:38 AM

## 2017-02-15 LAB — MAGNESIUM: Magnesium: 1.1 mg/dL — ABNORMAL LOW (ref 1.7–2.4)

## 2017-02-15 LAB — POTASSIUM: Potassium: 3.5 mmol/L (ref 3.5–5.1)

## 2017-02-15 MED ORDER — HALOPERIDOL LACTATE 5 MG/ML IJ SOLN
2.5000 mg | Freq: Four times a day (QID) | INTRAMUSCULAR | Status: DC | PRN
  Administered 2017-02-15 – 2017-02-20 (×6): 2.5 mg via INTRAVENOUS
  Filled 2017-02-15 (×7): qty 1

## 2017-02-15 MED ORDER — TRAZODONE HCL 50 MG PO TABS
50.0000 mg | ORAL_TABLET | Freq: Every evening | ORAL | Status: DC | PRN
  Administered 2017-02-16 – 2017-02-20 (×5): 50 mg via ORAL
  Filled 2017-02-15 (×5): qty 1

## 2017-02-15 MED ORDER — LEVOTHYROXINE SODIUM 100 MCG PO TABS
100.0000 ug | ORAL_TABLET | Freq: Every day | ORAL | Status: DC
  Administered 2017-02-16 – 2017-02-21 (×6): 100 ug via ORAL
  Filled 2017-02-15 (×6): qty 1

## 2017-02-15 MED ORDER — MAGNESIUM SULFATE 2 GM/50ML IV SOLN
2.0000 g | Freq: Once | INTRAVENOUS | Status: AC
  Administered 2017-02-15: 13:00:00 2 g via INTRAVENOUS
  Filled 2017-02-15: qty 50

## 2017-02-15 NOTE — Progress Notes (Addendum)
[facility] [place] NAME: Audrey Mcgee    MR#:  [id number]  DATE OF BIRTH:  [birth year]  SUBJECTIVE:   Patient is here due to altered mental status and noted to be severely hypothyroid.  Pt. Is hallucinating today.  Family at bedside. Didn't sleep well.    REVIEW OF SYSTEMS:    Review of Systems  Constitutional: Negative for chills and fever.  HENT: Negative for congestion and tinnitus.   Eyes: Negative for blurred vision and double vision.  Respiratory: Negative for cough, shortness of breath and wheezing.   Cardiovascular: Negative for chest pain, orthopnea and PND.  Gastrointestinal: Negative for abdominal pain, diarrhea, nausea and vomiting.  Genitourinary: Negative for dysuria and hematuria.  Neurological: Positive for weakness. Negative for dizziness, sensory change and focal weakness.  Psychiatric/Behavioral: Positive for hallucinations.  All other systems reviewed and are negative.   Nutrition: Regular Tolerating Diet: Yes Tolerating PT: Eval noted.   DRUG ALLERGIES:   Allergies  Allergen Reactions  . No Known Allergies     VITALS:  Blood pressure 119/78, pulse 85, temperature 97.8 F (36.6 C), temperature source Oral, resp. rate 20, height '5\' 4"'$  (1.626 m), weight 55.7 kg (122 lb 11.2 oz), last menstrual period 01/30/2008, SpO2 100 %.  PHYSICAL EXAMINATION:   Physical Exam  GENERAL:  56 y.o.-year-old patient lying in the bed confused and hallucinating.      EYES: Pupils equal, round, reactive to light and accommodation. No scleral icterus. Extraocular muscles intact.  HEENT: Head atraumatic, normocephalic. Oropharynx and nasopharynx clear.  NECK:  Supple, no jugular venous distention. No thyroid enlargement, no tenderness.  LUNGS: Normal breath sounds bilaterally, no wheezing, rales, rhonchi. No use of accessory muscles of respiration.  CARDIOVASCULAR: S1, S2 normal. No murmurs, rubs, or gallops.  ABDOMEN: Soft,  nontender, nondistended. Bowel sounds present. No organomegaly or mass.  EXTREMITIES: No cyanosis, clubbing or edema b/l.    NEUROLOGIC: Cranial nerves II through XII are intact. No focal Motor or sensory deficits b/l.  Globally weak.  PSYCHIATRIC: The patient is alert and oriented x 1.  SKIN: No obvious rash, lesion, or ulcer.    LABORATORY PANEL:   CBC  Recent Labs Lab 02/12/17 0416  WBC 6.7  HGB 12.8  HCT 37.3  PLT 249   ------------------------------------------------------------------------------------------------------------------  Chemistries   Recent Labs Lab 02/12/17 0416 02/14/17 0718 02/15/17 0612  NA 139 137  --   K 3.6 2.9* 3.5  CL 111 106  --   CO2 19* 23  --   GLUCOSE 103* 99  --   BUN 9 6  --   CREATININE 0.92 0.65  --   CALCIUM 8.1* 9.4  --   MG  --   --  1.1*  AST 15  --   --   ALT 6*  --   --   ALKPHOS 57  --   --   BILITOT 0.8  --   --    ------------------------------------------------------------------------------------------------------------------  Cardiac Enzymes No results for input(s): TROPONINI in the last 168 hours. ------------------------------------------------------------------------------------------------------------------  RADIOLOGY:  Dg Cervical Spine 2 Or 3 Views  Result Date: 02/13/2017 CLINICAL DATA:  Lower extremity weakness since June of last year. No injury. EXAM: CERVICAL SPINE - 2-3 VIEW COMPARISON:  None. FINDINGS: There is no evidence of cervical spine fracture or prevertebral soft tissue swelling. Alignment is normal. No other significant bone abnormalities are identified. IMPRESSION: Negative cervical spine radiographs. Electronically Signed   By: Jenny Reichmann  Alfonse Flavors M.D.   On: 02/13/2017 15:51   Dg Thoracic Spine 2 View  Result Date: 02/13/2017 CLINICAL DATA:  Lower extremity weakness 9 months. No injury. History of metastatic lung cancer. EXAM: THORACIC SPINE 2 VIEWS COMPARISON:  Chest x-ray 02/06/2017 FINDINGS:  Vertebral body alignment, heights and disc space heights are within normal. Minimal spondylosis of the thoracic spine. Pedicles are intact. No evidence of compression fracture or subluxation. Several air-filled bowel loops in the mid abdomen likely minimally dilated small bowel. IMPRESSION: Minimal spondylosis of the thoracic spine.  No acute findings. Several air-filled possibly dilated small bowel loops in the upper mid abdomen. Recommend clinical correlation as supine erect abdominal films may be helpful for further evaluation. Electronically Signed   By: Marin Olp M.D.   On: 02/13/2017 15:59   Dg Lumbar Spine 2-3 Views  Result Date: 02/13/2017 CLINICAL DATA:  Lower extremity weakness 8 months. No injury. History of metastatic lung cancer. EXAM: LUMBAR SPINE - 2-3 VIEW COMPARISON:  02/07/2017 and CT 02/04/2016 FINDINGS: Vertebral body alignment, heights and disc space heights are normal. There is mild spondylosis of the lumbar spine. Minimal facet arthropathy over the lower lumbar spine. No evidence of compression fracture or subluxation. Calcified plaque over the abdominal aorta. Multiple air-filled loops of large and small bowel are present. Multiple pelvic phleboliths unchanged. IMPRESSION: Mild spondylosis of the lumbar spine.  No acute findings. Aortic atherosclerosis. Electronically Signed   By: Marin Olp M.D.   On: 02/13/2017 15:54     ASSESSMENT AND PLAN:   56 year old female with past medical history of lung cancer, hypertension, hypothyroidism, COPD who presented to the hospital due to generalized weakness, poor by mouth intake and noted to have dysphagia and also noted to be severely hypothyroid.  1. Altered mental status-metabolic encephalopathy secondary to severe hypothyroidism and also pt. Not sleeping well for the past few days.  -Continue thyroid supplements and will raise dose of synthroid, add Trazodone to help sleep tonight.  -Patient also noted to low vitamin B-1 levels  and continue supplementation as per neurology. - follow mental status.  Avoid Benzo's. PRN haldol for agitation if needed.   2. Hypothyroidism-cause of patient's encephalopathy, altered mental status. -Continue Synthroid but will raise dose.  Follow TSH and thyroid function tests in a few weeks.  3. Intermittent N/V - etiology unclear but improved. Seen by GI and cont. Supportive care with Protonix, Zofran.  - no upper GI series done. GI recommended possible PEG placement but pt. Refused presently.  - cont. Supportive care for now.   4. Adult Failure to thrive - cont. Megace, supplements.   5. Stage IV Lung Cancer - MRI Brain (-) for metastatic disease.  - No treatment since Nov'17.  Cont. Follow up with Oncology as outpatient.   6. Generalized weakness - multifactorial and related to #2 & #5.  - seen by PT and they recommend SNF but pt. And family do not want SNF.  - will arrange home health prior to discharge.  Pt's family is requesting equipment at home like hospital bed, bedside commode, wheelchair and will make CM aware.   7. Thiamine Deficiency - cont. IV Thiamine.  Appreciate neuro input.    8. Hypokalemia/Hypomagnesemia - improved w/ supplementation and will given some IV Mg. Today.   All the records are reviewed and case discussed with Care Management/Social Worker. Management plans discussed with the patient, family and they are in agreement.  CODE STATUS: Full Code  DVT Prophylaxis: Lovenox  TOTAL  TIME TAKING CARE OF THIS PATIENT: 30 minutes.   POSSIBLE D/C IN 2-3 DAYS, DEPENDING ON CLINICAL CONDITION.   [name] M.D on 02/15/2017 at 12:36 PM  Between 7am to 6pm - Pager - [phone]  After 6pm go to [email or web address] - Proofreader  [facility]  Office  [phone]  CC: Primary care physician; Donnie Coffin, MD

## 2017-02-15 NOTE — Progress Notes (Signed)
[facility] Hematology/Oncology Progress Note  Date of admission: 02/06/2017  Hospital day:  02/15/2017  Chief Complaint: Audrey Mcgee is a 56 y.o. female with stage IV adenocarcinoma of the lung whop was admitted with general weakness, dehydration, and failure to thrive.  Subjective: The patient is eating well.  She is talking and laughing with her family.  She is confused at times.  She is unable to get out of bed and support her own weight.  Social History:  The patient is accompanied by her oldest daughter and boyfriend, Audrey Mcgee, today.  Her brother is her medical power of attorney.  Allergies:  Allergies  Allergen Reactions  . No Known Allergies     Scheduled Medications: . enoxaparin (LOVENOX) injection  40 mg Subcutaneous Q24H  . feeding supplement  1 Container Oral BID BM  . feeding supplement (ENSURE ENLIVE)  237 mL Oral TID WC  . folic acid  1 mg Oral Daily  . [START ON 02/16/2017] levothyroxine  100 mcg Oral QAC breakfast  . losartan  100 mg Oral Daily  . magnesium sulfate 1 - 4 g bolus IVPB  2 g Intravenous Once  . mouth rinse  15 mL Mouth Rinse BID  . megestrol  400 mg Oral BID  . metoprolol tartrate  50 mg Oral BID  . naLOXone (NARCAN)  injection  0.4 mg Intravenous Once  . nystatin  5 mL Oral QID  . pantoprazole sodium  40 mg Oral Daily  . potassium chloride  20 mEq Oral BID  . senna  1 tablet Oral Daily  . tiotropium  18 mcg Inhalation Daily    Review of Systems: GENERAL: Weak.  She has not gotten out of bed.  No fevers.  Weight loss. PERFORMANCE STATUS (ECOG):  4 HEENT:  No visual changes, runny nose, sore throat, mouth sores or tenderness. Lungs: Noshortness of breath or cough.  No hemoptysis. Cardiac:  Nochest pain, palpitations, orthopnea, or PND. GI:  Eating well.  She can recite everything she ate yesterday.  No nausea, vomiting, diarrhea, constipation, melena or hematochezia. GU:  No urgency, frequency, dysuria, or  hematuria. Musculoskeletal:  Back and hip discomfort.  Extremities:  No pain or swelling. Skin:  No rashes or skin changes. Neuro:  General weakness.  No headache, numbness or weakness, balance or coordination issues. Endocrine:  No diabetes.  Hypothyroid on supplementation.  No hot flashes or night sweats. Psych:  No apparent mood changes, depression or anxiety. Pain:  No focal pain. Review of systems:  All other systems reviewed and found to be negative.  Physical Exam: Blood pressure 119/78, pulse 85, temperature 97.8 F (36.6 C), temperature source Oral, resp. rate 20, height 5' 4"  (1.626 m), weight 122 lb 11.2 oz (55.7 kg), last menstrual period 01/30/2008, SpO2 100 %.  GENERAL:  Chronically ill appearing woman sitting up in bed, talking, in no acute distress. MENTAL STATUS: Awake and engaging.  She is oriented to person and place.  She believes it is "1900 and something". HEAD:  Brown hair.  Normocephalic, atraumatic, face symmetric, no Cushingoid features. EYES:  Brown eyes.  Pupils equal round and reactive to light and accomodation ENT:  Oropharynx clear without lesion.  Tongue normal. Mucous membranes moist.  RESPIRATORY: Clear to auscultation anteriorly without rales, wheezes or rhonchi. CARDIOVASCULAR:  Regular rate and rhythm without murmur, rub or gallop. ABDOMEN:  Soft, non-tender, with active bowel sounds, and no hepatosplenomegaly.  No masses. SKIN:  No rashes, ulcers or lesions. EXTREMITIES:  Thin.  No edema, no skin discoloration or tenderness.  No palpable cords. LYMPH NODES: No palpable cervical, supraclavicular, axillary or inguinal adenopathy  NEUROLOGICAL: Knows place [facility] in [place], [place]).  Does not know president, but calls him Sandy Salaam.  Know Obama.  Can count backwards by 1.  Knows people in room and her phone number.  Able to repeat 3 words, but remembers only 1 with prompting.  Follows commands.  Moves all 4 extremities (weak).   Results for  orders placed or performed during the hospital encounter of 02/06/17 (from the past 48 hour(s))  Basic metabolic panel     Status: Abnormal   Collection Time: 02/14/17  7:18 AM  Result Value Ref Range   Sodium 137 135 - 145 mmol/L   Potassium 2.9 (L) 3.5 - 5.1 mmol/L   Chloride 106 101 - 111 mmol/L   CO2 23 22 - 32 mmol/L   Glucose, Bld 99 65 - 99 mg/dL   BUN 6 6 - 20 mg/dL   Creatinine, Ser 0.65 0.44 - 1.00 mg/dL   Calcium 9.4 8.9 - 10.3 mg/dL   GFR calc non Af Amer >60 >60 mL/min   GFR calc Af Amer >60 >60 mL/min    Comment: (NOTE) The eGFR has been calculated using the CKD EPI equation. This calculation has not been validated in all clinical situations. eGFR's persistently <60 mL/min signify possible Chronic Kidney Disease.    Anion gap 8 5 - 15  Potassium     Status: None   Collection Time: 02/15/17  6:12 AM  Result Value Ref Range   Potassium 3.5 3.5 - 5.1 mmol/L  Magnesium     Status: Abnormal   Collection Time: 02/15/17  6:12 AM  Result Value Ref Range   Magnesium 1.1 (L) 1.7 - 2.4 mg/dL   Dg Cervical Spine 2 Or 3 Views  Result Date: 02/13/2017 CLINICAL DATA:  Lower extremity weakness since June of last year. No injury. EXAM: CERVICAL SPINE - 2-3 VIEW COMPARISON:  None. FINDINGS: There is no evidence of cervical spine fracture or prevertebral soft tissue swelling. Alignment is normal. No other significant bone abnormalities are identified. IMPRESSION: Negative cervical spine radiographs. Electronically Signed   By: Staci Righter M.D.   On: 02/13/2017 15:51   Dg Thoracic Spine 2 View  Result Date: 02/13/2017 CLINICAL DATA:  Lower extremity weakness 9 months. No injury. History of metastatic lung cancer. EXAM: THORACIC SPINE 2 VIEWS COMPARISON:  Chest x-ray 02/06/2017 FINDINGS: Vertebral body alignment, heights and disc space heights are within normal. Minimal spondylosis of the thoracic spine. Pedicles are intact. No evidence of compression fracture or subluxation. Several  air-filled bowel loops in the mid abdomen likely minimally dilated small bowel. IMPRESSION: Minimal spondylosis of the thoracic spine.  No acute findings. Several air-filled possibly dilated small bowel loops in the upper mid abdomen. Recommend clinical correlation as supine erect abdominal films may be helpful for further evaluation. Electronically Signed   By: Marin Olp M.D.   On: 02/13/2017 15:59   Dg Lumbar Spine 2-3 Views  Result Date: 02/13/2017 CLINICAL DATA:  Lower extremity weakness 8 months. No injury. History of metastatic lung cancer. EXAM: LUMBAR SPINE - 2-3 VIEW COMPARISON:  02/07/2017 and CT 02/04/2016 FINDINGS: Vertebral body alignment, heights and disc space heights are normal. There is mild spondylosis of the lumbar spine. Minimal facet arthropathy over the lower lumbar spine. No evidence of compression fracture or subluxation. Calcified plaque over the abdominal aorta. Multiple air-filled loops  of large and small bowel are present. Multiple pelvic phleboliths unchanged. IMPRESSION: Mild spondylosis of the lumbar spine.  No acute findings. Aortic atherosclerosis. Electronically Signed   By: Marin Olp M.D.   On: 02/13/2017 15:54    Assessment:  Audrey Mcgee is a 56 y.o. female with metastatic poorly differentiated lung cancer.  She was diagnosed in 09/2014.  She received concurrent chemotherapy and radiation followed by maintenance [facility] (last 10/31/2016).  She has not received further chemotherapy secondary to missed appointments.  She underwent PET scan on 01/29/2017 for restaging.  Imaging revealed abnormal small volume hypermetabolic adenopathy in the neck, chest, and upper abdomen with probable involvement of the left adrenal gland, suspicious for recurrent metastatic lung cancer.  There was stable sub solid nodularity in the left upper lobe with a new partially solid 7 mm nodule in the left lower lobe.  She declined cervical biopsy to confirm disease represented her  lung cancer rather than a second malignancy (lymphoma).  She was seen in the medical oncology clinic on 02/06/2017 with nausea and vomiting.  She was weak and fatigued.  She had lost 14 pounds in 6 weeks.  She received IVF then left the clinic.  She returned to the ER with ongoing weakness.  Head MRI on 02/10/2017 revealed no evidence of metastatic disease.  She has a history of hyperthyroidism.  Thyroid function studies on 02/08/2017 revealed severe hypothyroidism with a TSH of 77 (high) and a free T4 of < 0.25 (low).  She was also B1 deficient.  Plan: 1.  Oncology:  Patient has low volume metastatic disease.  No evidence of CNS disease.  Etiology of failure to thrive not secondary to her malignancy.  Ideally, would like to confirm PET + adenopathy reveals non-small cell lung cancer (rather than lymphoma), but patient previously declined.  She has adamantly stated on several occasions that she wishes to continue chemotherapy.  At last meeting, she was agreeable to lymph node biopsy if her nodes grew on therapy.  She is not a candidate for chemotherapy until her performance status improves.  2.  Endocrinology:  Patient with a history of hyperthyroidism, but labs indicate severe hypothyroidism.  No evidence of myxedema coma (hypotension, bradycardia, hyponatremia, hypoglycemia).  Temperature slightly low.  She has manifested confusion at times and ongoing lethargy.  Cortisol level is normal.  Patient on levothyroxine. She is improving.  3.  Neurology:  Functional status has dramatically improved since initiation of Synthroid and vitamin B1.  [facility] consultation.  4.  Nutrition:  Weight loss of greater than 10% in past 6 weeks.  Patient now eating.  B1 level low on supplementation.   5.  Disposition:  Code status remains full.  Per boyfriend, her brother has medical power of attorney (unclear if she has paperwork documenting).  Patient unable to care for herself.  Physical therapy while  hospitalized.  Given her deconditioning, she will need skilled nursing when she is discharged.   Lequita Asal, MD  02/15/2017, 2:07 PM

## 2017-02-15 NOTE — Plan of Care (Signed)
Problem: Nutrition: Goal: Adequate nutrition will be maintained Outcome: Not Progressing Patient continues to have poor appetite. Family at bedside and continues to offer food choices with patient only taking bites.

## 2017-02-16 LAB — MAGNESIUM: MAGNESIUM: 1.3 mg/dL — AB (ref 1.7–2.4)

## 2017-02-16 MED ORDER — MAGNESIUM SULFATE 2 GM/50ML IV SOLN
2.0000 g | Freq: Once | INTRAVENOUS | Status: DC
  Filled 2017-02-16: qty 50

## 2017-02-16 MED ORDER — MAGNESIUM SULFATE 4 GM/100ML IV SOLN
4.0000 g | Freq: Once | INTRAVENOUS | Status: AC
  Administered 2017-02-16: 4 g via INTRAVENOUS
  Filled 2017-02-16: qty 100

## 2017-02-16 MED ORDER — THIAMINE HCL 100 MG/ML IJ SOLN
Freq: Every day | INTRAMUSCULAR | Status: AC
  Administered 2017-02-16 – 2017-02-20 (×5): via INTRAVENOUS
  Filled 2017-02-16 (×5): qty 100
  Filled 2017-02-16: qty 4

## 2017-02-16 MED ORDER — THIAMINE HCL 100 MG/ML IJ SOLN: 250.0000 mg | Freq: Every day | INTRAMUSCULAR | Status: DC

## 2017-02-16 NOTE — Progress Notes (Signed)
[facility] [place] NAME: Audrey Mcgee    MR#:  [id number]  DATE OF BIRTH:  1961/05/31  SUBJECTIVE:   Patient is here due to altered mental status and noted to be severely hypothyroid. Mental status improved today.  Slept a bit last night and appetite has improved.  No more Hallucinations.   REVIEW OF SYSTEMS:    Review of Systems  Constitutional: Negative for chills and fever.  HENT: Negative for congestion and tinnitus.   Eyes: Negative for blurred vision and double vision.  Respiratory: Negative for cough, shortness of breath and wheezing.   Cardiovascular: Negative for chest pain, orthopnea and PND.  Gastrointestinal: Negative for abdominal pain, diarrhea, nausea and vomiting.  Genitourinary: Negative for dysuria and hematuria.  Neurological: Positive for weakness. Negative for dizziness, sensory change and focal weakness.  All other systems reviewed and are negative.   Nutrition: Regular Tolerating Diet: Yes Tolerating PT: Eval noted.   DRUG ALLERGIES:   Allergies  Allergen Reactions  . No Known Allergies     VITALS:  Blood pressure 128/82, pulse 90, temperature 97.8 F (36.6 C), temperature source Oral, resp. rate 20, height '5\' 4"'$  (1.626 m), weight 55.7 kg (122 lb 11.2 oz), last menstrual period 01/30/2008, SpO2 100 %.  PHYSICAL EXAMINATION:   Physical Exam  GENERAL:  56 y.o.-year-old patient lying in the bed in NAD.  EYES: Pupils equal, round, reactive to light and accommodation. No scleral icterus. Extraocular muscles intact.  HEENT: Head atraumatic, normocephalic. Oropharynx and nasopharynx clear.  NECK:  Supple, no jugular venous distention. No thyroid enlargement, no tenderness.  LUNGS: Normal breath sounds bilaterally, no wheezing, rales, rhonchi. No use of accessory muscles of respiration.  CARDIOVASCULAR: S1, S2 normal. No murmurs, rubs, or gallops.  ABDOMEN: Soft, nontender, nondistended. Bowel sounds  present. No organomegaly or mass.  EXTREMITIES: No cyanosis, clubbing or edema b/l.    NEUROLOGIC: Cranial nerves II through XII are intact. No focal Motor or sensory deficits b/l.  Globally weak.  PSYCHIATRIC: The patient is alert and oriented x 3.  SKIN: No obvious rash, lesion, or ulcer.    LABORATORY PANEL:   CBC  Recent Labs Lab 02/12/17 0416  WBC 6.7  HGB 12.8  HCT 37.3  PLT 249   ------------------------------------------------------------------------------------------------------------------  Chemistries   Recent Labs Lab 02/12/17 0416 02/14/17 0718 02/15/17 0612 02/16/17 0431  NA 139 137  --   --   K 3.6 2.9* 3.5  --   CL 111 106  --   --   CO2 19* 23  --   --   GLUCOSE 103* 99  --   --   BUN 9 6  --   --   CREATININE 0.92 0.65  --   --   CALCIUM 8.1* 9.4  --   --   MG  --   --  1.1* 1.3*  AST 15  --   --   --   ALT 6*  --   --   --   ALKPHOS 57  --   --   --   BILITOT 0.8  --   --   --    ------------------------------------------------------------------------------------------------------------------  Cardiac Enzymes No results for input(s): TROPONINI in the last 168 hours. ------------------------------------------------------------------------------------------------------------------  RADIOLOGY:  No results found.   ASSESSMENT AND PLAN:   56 year old female with past medical history of lung cancer, hypertension, hypothyroidism, COPD who presented to the hospital due to generalized weakness, poor by mouth  intake and noted to have dysphagia and also noted to be severely hypothyroid.  1. Altered mental status-metabolic encephalopathy secondary to severe hypothyroidism, Vitamin B-1 deficiency and also pt. Not sleeping well for the past few days.  -Much improved today. No further hallucinations today. -Continue Synthroid, vitamin B-1 supplements, trazodone for sleep. Avoid benzo's, continue when necessary Haldol for agitation. Appreciate neurology  input.  2. Hypothyroidism-cause of patient's encephalopathy, altered mental status. -Continue Synthroid. Follow TSH and thyroid function tests in a few weeks.  3. Intermittent N/V - etiology unclear but improved. Seen by GI and cont. Supportive care with Protonix, Zofran.  -Oral intake improving and no acute symptoms presently.  4. Adult Failure to thrive - cont. Megace, supplements.  - appetite improving.   5. Stage IV Lung Cancer - MRI Brain (-) for metastatic disease.  - No treatment since Nov'17.  Cont. Follow up with Oncology as outpatient.   6. Generalized weakness - multifactorial and related to #2 & #5.  - seen by PT and they recommend SNF but pt. And family do not want SNF.  - will arrange home health prior to discharge.  Pt's family is requesting equipment at home like hospital bed, bedside commode, wheelchair and will make CM aware.   7. Thiamine Deficiency - cont. IV Thiamine.  Appreciate neuro input.    8. Hypokalemia/Hypomagnesemia - cont. To supplement and repeat level in a.m.     All the records are reviewed and case discussed with Care Management/Social Worker. Management plans discussed with the patient, family and they are in agreement.  CODE STATUS: Full Code  DVT Prophylaxis: Lovenox  TOTAL TIME TAKING CARE OF THIS PATIENT: 30 minutes.   POSSIBLE D/C IN 2-3 DAYS, DEPENDING ON CLINICAL CONDITION.   [name] M.D on 02/16/2017 at 1:29 PM  Between 7am to 6pm - Pager - [phone]  After 6pm go to [email or web address] - Proofreader  [facility]  Office  [phone]  CC: Primary care physician; Donnie Coffin, MD

## 2017-02-16 NOTE — Progress Notes (Signed)
Physical Therapy Treatment Patient Details Name: Audrey Mcgee MRN: [id number] DOB: 12/09/1961 Today's Date: 02/16/2017    History of Present Illness Pt is a 56 y.o. female presenting to hospital with weakness, nausea, dizziness, lightheadedness, and confusion.  Pt admitted to hospital with AMS, failure to thrive, hypokalemia, and dehydration.  PMH includes COPD, metastatic stage 4 lung CA, htn, h/o chemotherapy.    PT Comments    Pt awake and alert; required frequent encouragement and education to participate in PT treatment. She complained of bilat LE pain during bed mobility that resolved w/ repositioning. Pt displayed weakness and poor activity tolerance throughout session and is somewhat self limiting requiring frequent cuing and encouragement to perform functional tasks and therex. She is able to perform bed mobility and sitting activities w/ min assist to move to EOB. Pt only able to tolerate sitting position for a few minutes before requesting a break and requires occasional min to mod assist to maintain seated posture. She currently presents w/ decreased activity tolerance and decreased strength that limit safe household mobility. She will continue to benefit from skilled PT to address deficits and recommend she transition to SNF following acute hospital stay.    Follow Up Recommendations  SNF     Equipment Recommendations  Wheelchair (measurements PT);Wheelchair cushion (measurements PT);3in1 (PT)    Recommendations for Other Services       Precautions / Restrictions Precautions Precautions: Fall Restrictions Weight Bearing Restrictions: No    Mobility  Bed Mobility Overal bed mobility: Needs Assistance Bed Mobility: Supine to Sit;Sit to Sidelying     Supine to sit: Min assist;HOB elevated   Sit to sidelying: Supervision;HOB elevated General bed mobility comments: 1st trial patient required min assist to move LE's to EOB and cuing for movement of trunk into  upright position w/ HOB elevated, pt displayed poor sitting tolerance and able to move back to sidelying in bed w/ no assistance, 2nd trial patient required min assist to sit and able to scoot to EOB using bed rails and UE's  Transfers                 General transfer comment: Did not attempt, patient stated she was too tired to try  Ambulation/Gait                 Stairs            Wheelchair Mobility    Modified Rankin (Stroke Patients Only)       Balance Overall balance assessment: Needs assistance Sitting-balance support: Single extremity supported;Feet supported Sitting balance-Leahy Scale: Poor Sitting balance - Comments: requires grasp of bedrail to maintain upright posture, leans posteriorly and requries occasional min to mod assist to correct Postural control: Posterior lean                          Cognition Arousal/Alertness: Awake/alert Behavior During Therapy: Flat affect Overall Cognitive Status: Within Functional Limits for tasks assessed                      Exercises General Exercises - Lower Extremity Long Arc Quad: AROM;Both;10 reps;Strengthening;Seated Hip Flexion/Marching: AROM;Strengthening;Both;10 reps;Seated    General Comments        Pertinent Vitals/Pain Faces Pain Scale: Hurts even more Pain Location: bilat feet and lower legs Pain Descriptors / Indicators: Aching Pain Intervention(s): Monitored during session;Repositioned;Limited activity within patient's tolerance    Home Living  Prior Function            PT Goals (current goals can now be found in the care plan section) Acute Rehab PT Goals Patient Stated Goal: To return home PT Goal Formulation: With patient/family Time For Goal Achievement: 02/27/17 Potential to Achieve Goals: Fair Progress towards PT goals: Progressing toward goals    Frequency    Min 2X/week      PT Plan Current plan remains  appropriate    Co-evaluation             End of Session   Activity Tolerance: Patient limited by fatigue;Patient limited by pain Patient left: in bed;with bed alarm set;with call bell/phone within reach;with family/visitor present;Other (comment) (fall mats placed on floor) Nurse Communication: Mobility status PT Visit Diagnosis: Muscle weakness (generalized) (M62.81);History of falling (Z91.81);Other abnormalities of gait and mobility (R26.89)     Time: 9191-6606 PT Time Calculation (min) (ACUTE ONLY): 29 min  Charges:                       G Codes:       Genecis Veley Student PT 02/16/2017, 4:03 PM

## 2017-02-16 NOTE — Plan of Care (Signed)
Problem: Fluid Volume: Goal: Ability to maintain a balanced intake and output will improve Outcome: Not Progressing Pt continues to have poor PO intake, multiple family at bedside encouraging and offering choices. Pt refusing all medications this shift. Education provided and patient continues to refuse.   Problem: Nutrition: Goal: Adequate nutrition will be maintained Outcome: Not Progressing Pt continues to have poor PO intake, multiple family at bedside encouraging and offering choices. Pt refusing all medications this shift. Education provided and patient continues to refuse.

## 2017-02-17 LAB — BASIC METABOLIC PANEL
Anion gap: 6 (ref 5–15)
BUN: 10 mg/dL (ref 6–20)
CO2: 24 mmol/L (ref 22–32)
Calcium: 9 mg/dL (ref 8.9–10.3)
Chloride: 109 mmol/L (ref 101–111)
Creatinine, Ser: 0.73 mg/dL (ref 0.44–1.00)
GFR calc Af Amer: >60 mL/min (ref >60)
GLUCOSE: 114 mg/dL — AB (ref 65–99)
Potassium: 3.1 mmol/L — ABNORMAL LOW (ref 3.5–5.1)
SODIUM: 139 mmol/L (ref 135–145)

## 2017-02-17 LAB — MAGNESIUM: MAGNESIUM: 1.9 mg/dL (ref 1.7–2.4)

## 2017-02-17 MED ORDER — SODIUM CHLORIDE 0.9 % IV BOLUS (SEPSIS)
500.0000 mL | Freq: Once | INTRAVENOUS | Status: AC
  Administered 2017-02-17: 17:00:00 500 mL via INTRAVENOUS

## 2017-02-17 MED ORDER — POTASSIUM CHLORIDE 2 MEQ/ML IV SOLN
30.0000 meq | Freq: Once | INTRAVENOUS | Status: AC
  Administered 2017-02-17: 16:00:00 30 meq via INTRAVENOUS
  Filled 2017-02-17: qty 15

## 2017-02-17 NOTE — Progress Notes (Signed)
[facility] NAME: Dalal Livengood    MR#:  [id number]  DATE OF BIRTH:  11-18-1961  SUBJECTIVE:   Patient is here due to altered mental status and noted to be severely hypothyroid. Remains confused but no agitations. Refusing to have IV placed as she pulled it out.  Appetite is good.    REVIEW OF SYSTEMS:    Review of Systems  Constitutional: Negative for chills and fever.  HENT: Negative for congestion and tinnitus.   Eyes: Negative for blurred vision and double vision.  Respiratory: Negative for cough, shortness of breath and wheezing.   Cardiovascular: Negative for chest pain, orthopnea and PND.  Gastrointestinal: Negative for abdominal pain, diarrhea, nausea and vomiting.  Genitourinary: Negative for dysuria and hematuria.  Neurological: Positive for weakness. Negative for dizziness, sensory change and focal weakness.  All other systems reviewed and are negative.   Nutrition: Regular Tolerating Diet: Yes Tolerating PT: Eval noted.   DRUG ALLERGIES:   Allergies  Allergen Reactions  . No Known Allergies     VITALS:  Blood pressure 99/75, pulse 96, temperature 98.5 F (36.9 C), temperature source Oral, resp. rate 20, height '5\' 4"'$  (1.626 m), weight 55.7 kg (122 lb 11.2 oz), last menstrual period 01/30/2008, SpO2 99 %.  PHYSICAL EXAMINATION:   Physical Exam  GENERAL:  56 y.o.-year-old patient lying in bed in NAD.  EYES: Pupils equal, round, reactive to light and accommodation. No scleral icterus. Extraocular muscles intact.  HEENT: Head atraumatic, normocephalic. Oropharynx and nasopharynx clear.  NECK:  Supple, no jugular venous distention. No thyroid enlargement, no tenderness.  LUNGS: Normal breath sounds bilaterally, no wheezing, rales, rhonchi. No use of accessory muscles of respiration.  CARDIOVASCULAR: S1, S2 normal. No murmurs, rubs, or gallops.  ABDOMEN: Soft, nontender, nondistended. Bowel sounds present. No  organomegaly or mass.  EXTREMITIES: No cyanosis, clubbing or edema b/l.    NEUROLOGIC: Cranial nerves II through XII are intact. No focal Motor or sensory deficits b/l.  Globally weak.  PSYCHIATRIC: The patient is alert and oriented x 2.  SKIN: No obvious rash, lesion, or ulcer.    LABORATORY PANEL:   CBC  Recent Labs Lab 02/12/17 0416  WBC 6.7  HGB 12.8  HCT 37.3  PLT 249   ------------------------------------------------------------------------------------------------------------------  Chemistries   Recent Labs Lab 02/12/17 0416  02/17/17 0309  NA 139  < > 139  K 3.6  < > 3.1*  CL 111  < > 109  CO2 19*  < > 24  GLUCOSE 103*  < > 114*  BUN 9  < > 10  CREATININE 0.92  < > 0.73  CALCIUM 8.1*  < > 9.0  MG  --   < > 1.9  AST 15  --   --   ALT 6*  --   --   ALKPHOS 57  --   --   BILITOT 0.8  --   --   < > = values in this interval not displayed. ------------------------------------------------------------------------------------------------------------------  Cardiac Enzymes No results for input(s): TROPONINI in the last 168 hours. ------------------------------------------------------------------------------------------------------------------  RADIOLOGY:  No results found.   ASSESSMENT AND PLAN:   56 year old female with past medical history of lung cancer, hypertension, hypothyroidism, COPD who presented to the hospital due to generalized weakness, poor by mouth intake and noted to have dysphagia and also noted to be severely hypothyroid.  1. Altered mental status-metabolic encephalopathy secondary to severe hypothyroidism, Vitamin B-1 deficiency and also lack  of sleep.  - still having periods of confusion.  No agitation.    -Continue Synthroid, vitamin B-1 supplements, trazodone for sleep. Avoid benzo's, continue when necessary Haldol for agitation. Appreciate neurology input.  2. Hypothyroidism-cause of patient's encephalopathy, altered mental  status. -Continue Synthroid. Follow TSH and thyroid function tests in a few weeks and outpatient Endocrine referral.   3. Intermittent N/V - etiology unclear but improved. Seen by GI and cont. Supportive care with Protonix, Zofran.  -Oral intake improving and no acute symptoms presently.  4. Adult Failure to thrive - cont. Megace, supplements.  - appetite much improved.   5. Stage IV Lung Cancer - MRI Brain (-) for metastatic disease.  - No treatment since Nov'17.  Cont. Follow up with Oncology as outpatient.   6. Generalized weakness - multifactorial and related to #2 & #5.  - seen by PT and they recommend SNF but pt. And family do not want SNF.  - will need home health prior to discharge.     7. Thiamine Deficiency - cont. IV Thiamine.  Appreciate neuro input.    8. Hypokalemia/Hypomagnesemia - improving w/ supplementation and will cont. To monitor.     All the records are reviewed and case discussed with Care Management/Social Worker. Management plans discussed with the patient, family and they are in agreement.  CODE STATUS: Full Code  DVT Prophylaxis: Lovenox  TOTAL TIME TAKING CARE OF THIS PATIENT:  25 minutes.   POSSIBLE D/C IN 2-3 DAYS, DEPENDING ON CLINICAL CONDITION.   [name] M.D on 02/17/2017 at 1:37 PM  Between 7am to 6pm - Pager - [phone]  After 6pm go to [email or web address] - Proofreader  [facility]  Office  [phone]  CC: Primary care physician; Donnie Coffin, MD

## 2017-02-17 NOTE — Care Management (Signed)
Spoke with [name] (daughter) per telephone. [phone]). States she will be coming to the hospital about 2:00pm today. Will discuss discharge plans for Ms. Ashkar at this time. Ms. Coderre will be getting her last dose of Thiamine 250 mg IV on 02/21/17.  Shelbie Ammons RN MSN CCM Care Management

## 2017-02-17 NOTE — Progress Notes (Signed)
Pt still has not voided this shift.  Bladder scan shows 170, Dr. Verdell Carmine made aware.  500 NS bolus ordered.  Will await results.  Clarise Cruz, RN

## 2017-02-17 NOTE — Progress Notes (Signed)
MEDICATION RELATED CONSULT NOTE - INITIAL   Pharmacy Consult for electrolyte management Indication: hypokalemia  Allergies  Allergen Reactions  . No Known Allergies     Patient Measurements: Height: '5\' 4"'$  (162.6 cm) Weight: 122 lb 11.2 oz (55.7 kg) (bed scale) IBW/kg (Calculated) : 54.7 Adjusted Body Weight:   Vital Signs: Temp: 98.5 F (36.9 C) (02/27 0357) Temp Source: Oral (02/27 0357) BP: 99/75 (02/27 1003) Pulse Rate: 96 (02/27 1003) Intake/Output from previous day: 02/26 0701 - 02/27 0700 In: 200 [I.V.:100; IV Piggyback:100] Out: 1093 [Urine:1093] Intake/Output from this shift: No intake/output data recorded.  Labs:  Recent Labs  02/15/17 0612 02/16/17 0431 02/17/17 0309  CREATININE  --   --  0.73  MG 1.1* 1.3* 1.9   Estimated Creatinine Clearance: 70.2 mL/min (by C-G formula based on SCr of 0.73 mg/dL).   Microbiology: No results found for this or any previous visit (from the past 720 hour(s)).  Medical History: Past Medical History:  Diagnosis Date  . COPD (chronic obstructive pulmonary disease) ([place])   . Hypertension   . Lung cancer ([place])   . Thyroid disease     Medications:  Scheduled:  . enoxaparin (LOVENOX) injection  40 mg Subcutaneous Q24H  . feeding supplement  1 Container Oral BID BM  . feeding supplement (ENSURE ENLIVE)  237 mL Oral TID WC  . folic acid  1 mg Oral Daily  . levothyroxine  100 mcg Oral QAC breakfast  . losartan  100 mg Oral Daily  . mouth rinse  15 mL Mouth Rinse BID  . megestrol  400 mg Oral BID  . metoprolol tartrate  50 mg Oral BID  . naLOXone (NARCAN)  injection  0.4 mg Intravenous Once  . nystatin  5 mL Oral QID  . pantoprazole sodium  40 mg Oral Daily  . potassium chloride (KCL MULTIRUN) 30 mEq in 265 mL IVPB  30 mEq Intravenous Once  . senna  1 tablet Oral Daily  . thiamine (B-1) 250 mg in NS 0.9% 100 mL    Intravenous Daily  . tiotropium  18 mcg Inhalation Daily    Assessment: Patient admitted for  hypothyroidism and AMS found to be hypokalemic of K+ 3.1 And hypomagnesemic w/ Mg 1.1 - 1.2 -- was replaced and now Mg is at goal.  Goal of Therapy:  K+ 3.5 - 5.0 Mg 1.8 - 2.2  Plan:  Will administer Kcl 30 mEq IV x 1 and will recheck BMP w/ am labs. Expected K+ after supplementation: 3.4  Thank you for this consult.  Tobie Lords, PharmD, BCPS Clinical Pharmacist 02/17/2017

## 2017-02-17 NOTE — Progress Notes (Signed)
New referral for Audrey Mcgee received from [facility].  Services of skilled nursing, physical therapy and home health aide are requested. Ms. Audrey Mcgee is a 56 year old woman with a known history of stage IV lung cancer, COPD, HTN and thyroid disease admitted to [facility] on 2/16 for treatment of dehydration and poor oral intake/weakness. She has had a complicated stay and Palliative Medicine has been involved since 2/20. She is currently receiving IV Thiamine for treatment of B1 deficiency. DME to be ordered include hospital bed, BSC and wheel chair. [facility] aware. Writer met in the family room with patient's daughter Audrey Mcgee and Audrey Mcgee who she will be living with and will be her primary care givers. [facility] services discussed, both are agreeable. [facility] information and contact number given. Plan is for discharge after completion of IV thiamine therapy. Patient information faxed to referral. Will continue to follow through final disposition. Thank you. Audrey Shanks Rn, BSN, [facility] Liaison [phone] c

## 2017-02-17 NOTE — Progress Notes (Signed)
Physical Therapy Treatment Patient Details Name: Audrey Mcgee MRN: [id number] DOB: December 11, 1961 Today's Date: 02/17/2017    History of Present Illness Pt is a 56 y.o. female presenting to hospital with weakness, nausea, dizziness, lightheadedness, and confusion.  Pt admitted to hospital with AMS, failure to thrive, hypokalemia, and dehydration.  PMH includes COPD, metastatic stage 4 lung CA, htn, h/o chemotherapy.    PT Comments    Pt reporting pain in feet with standing but none when not WB'ing through LE's.  Pt requesting to toilet during session and assisted to commode.  Pt requiring encouragement to participate during session and pt firmly refusing to transfer to chair after toileting (pt adamant she needed to go back to bed instead) and requiring cueing for safety to return to bed (pt impulsively trying to get back into bed from commode).  Will continue to progress pt with strengthening and functional mobility per pt tolerance and willingness to participate.    Follow Up Recommendations  SNF     Equipment Recommendations  Wheelchair (measurements PT);Wheelchair cushion (measurements PT);3in1 (PT)    Recommendations for Other Services       Precautions / Restrictions Precautions Precautions: Fall Restrictions Weight Bearing Restrictions: No    Mobility  Bed Mobility Overal bed mobility: Needs Assistance Bed Mobility: Supine to Sit;Sit to Supine     Supine to sit: Min guard Sit to supine: Min assist;+2 for physical assistance (assist for B LE's; 2 assist to boost up in bed)      Transfers Overall transfer level: Needs assistance Equipment used: Rolling walker (2 wheeled) Transfers: Sit to/from [facility] Sit to Stand: Mod assist;+2 physical assistance Stand pivot transfers: Mod assist;+2 physical assistance       General transfer comment: x2 trials standing from bed; x1 trial from commode; stand step turn bed to/from commode with RW; vc's  for technique required  Ambulation/Gait             General Gait Details: pt declining to attempt ambulation   Stairs            Wheelchair Mobility    Modified Rankin (Stroke Patients Only)       Balance Overall balance assessment: Needs assistance Sitting-balance support: Bilateral upper extremity supported;Feet supported Sitting balance-Leahy Scale: Poor Sitting balance - Comments: posterior lean requiring UE support on bed rail Postural control: Posterior lean   Standing balance-Leahy Scale: Fair Standing balance comment: static standing with RW                    Cognition Arousal/Alertness: Awake/alert Behavior During Therapy: Flat affect Overall Cognitive Status:  (Oriented to person and place)                      Exercises      General Comments General comments (skin integrity, edema, etc.): No family present during session.  Nursing cleared pt for participation in physical therapy.  Pt agreeable to limited PT session.      Pertinent Vitals/Pain Pain Assessment: Faces Faces Pain Scale: Hurts even more Pain Location: B feet with WB'ing Pain Descriptors / Indicators: Aching Pain Intervention(s): Limited activity within patient's tolerance;Monitored during session;Repositioned  Vitals (HR and O2 on room air) stable and WFL throughout treatment session.    Home Living                      Prior Function  PT Goals (current goals can now be found in the care plan section) Acute Rehab PT Goals Patient Stated Goal: To return home PT Goal Formulation: With patient/family Time For Goal Achievement: 02/27/17 Potential to Achieve Goals: Fair Progress towards PT goals: Progressing toward goals    Frequency    Min 2X/week      PT Plan Current plan remains appropriate    Co-evaluation             End of Session Equipment Utilized During Treatment: Gait belt Activity Tolerance: Patient limited by  fatigue Patient left: in bed;with call bell/phone within reach;with bed alarm set;with nursing/sitter in room (B heels elevated via pillow) Nurse Communication: Mobility status;Precautions PT Visit Diagnosis: Muscle weakness (generalized) (M62.81);History of falling (Z91.81);Other abnormalities of gait and mobility (R26.89)     Time: 5449-2010 PT Time Calculation (min) (ACUTE ONLY): 32 min  Charges:  $Therapeutic Activity: 23-37 mins                    G CodesLeitha Bleak, PT 02/17/17, 1:38 PM [phone]

## 2017-02-17 NOTE — Progress Notes (Signed)
Pt has not voided this shift. Bladder scan shows 179, Dr. Verdell Carmine made aware and no new orders.  Will continue to wait and see if pt voids.  Clarise Cruz, RN

## 2017-02-17 NOTE — Progress Notes (Addendum)
Speech Therapy Note: reviewed chart notes, labs; consulted NSG then pt - pt was eating her lunch meal as we talked. Pt has upgraded to a regular consistency diet and has been tolerating bites and sips, often eating a little more. NSG reports no overt s/s of aspiration; none noted w/ breakfast/lunch meals today.  Pt appears at her baseline w/ swallowing; Dietician following. No further skilled ST services indicated at this time. NSG updated.    Orinda Kenner, [place], CCC-SLP

## 2017-02-17 NOTE — Clinical Social Work Note (Signed)
CSW is continuing to follow for support. Per RNCM, pt will returning home with [facility]. CSW will sign off as no further needs identified, however is available for support if needed. Please reconsult if a need arises in the future.   Darden Dates, MSW, LCSW  Clinical Social Worker  [phone]

## 2017-02-17 NOTE — Care Management (Signed)
Family meeting with daughters [name] and [name]. Ms. Morford will be living with the daughters. Address: [address] [place].  Discussed home health agencies. Chose [facility]. [name] RN representative for [employer] updated.  Requested Hospital bed, bedside commode, wheelchair, and rolling walker. Will request equipment per [place]. States that Ms. Twedt's primary care is at [facility]. Prescriptions are filled at [facility] on [address]. Family will transport per private car.  Ms. Shadd Thiamine 250 mg IV will be completed 02/21/17. Shelbie Ammons RN MSN CCM Care Management

## 2017-02-17 NOTE — Progress Notes (Signed)
Nutrition Follow-up  DOCUMENTATION CODES:   Severe malnutrition in context of chronic illness  INTERVENTION:  Continue Ensure Enlive po TID, each supplement provides 350 kcal and 20 grams of protein. Ordered through [facility] to come TID on meal trays mixed in ice cream to make milkshake. Specified patient prefers strawberry.   Will discontinue Boost Breeze.   Patient's PO intake has improved significantly with improvement in mental status. Patient can likely meet her estimated calorie/protein needs with encouragement at meals and oral nutrition supplements.   NUTRITION DIAGNOSIS:   Inadequate oral intake related to poor appetite, cancer and cancer related treatments, nausea as evidenced by other (see comment) (per RN and SLP report).  Improving with improvement in mental status. Addressing with ONS.  GOAL:   Patient will meet greater than or equal to 90% of their needs  Met with calories, progressing with protein.  MONITOR:   PO intake, Supplement acceptance, Labs, I & O's, Weight trends  REASON FOR ASSESSMENT:   Consult Assessment of nutrition requirement/status  ASSESSMENT:   55 year old female with PMHx of HTN, COPD, lung cancer who presents with generalized weakness, nausea, poor oral intake.   -GI assessed patient on 2/24. Patient had started to increase PO intake and was refusing PEG tube at that time. Recommendation was to try Kcal count, SLP evaluation, upper GI series, encourage PO and consult GI afterwards.  -Patient will be followed by Palliative Medicine at discharge.  Spoke with patient at bedside. She reports her appetite is much better. She reports she is having nausea occasionally. Patient reports she does not like the [employer]. She reports she enjoys the milkshakes with meals (Ensure) and likes strawberry the best.   Meal Completion: 50-100% per chart and patient report.  In the past 24 hours patient has had approximately 1507 kcal (90% minimum  estimated kcal needs) and 59 grams of protein (69% minimum estimated protein needs).   Medications reviewed and include: folic acid 1 mg daily, levothyroxine, Megace 400 mg BID, pantoprazole, potassium chloride 30 mEq once today, senna, thiamine 250 mg daily (ordered 2/26-3/3).   Labs reviewed: Potassium 3.1, Magnesium 1.9 (was 1.1 on 2/25 and 1.3 on 2/26). Vitamin B1 (thiamine) 34.9 on 2/21. Folate WNL and Vitamin B12 2512.   Discussed with RN. Patient is not drinking the [employer].  Diet Order:  Diet - low sodium heart healthy Diet regular Room service appropriate? Yes; Fluid consistency: Thin  Skin:  Reviewed, no issues  Last BM:  02/12/2017  Height:   Ht Readings from Last 1 Encounters:  02/07/17 5' 4"  (1.626 m)    Weight:   Wt Readings from Last 1 Encounters:  02/09/17 122 lb 11.2 oz (55.7 kg)    Ideal Body Weight:  54.5 kg  BMI:  Body mass index is 21.06 kg/m.  Estimated Nutritional Needs:   Kcal:  1670-1950 (30-35 kcal/kg)  Protein:  85-95 grams (1.5-1.7 grams/kg)  Fluid:  1.6 L/day (30 ml/kg)  EDUCATION NEEDS:   Education needs no appropriate at this time  Willey Blade, MS, RD, LDN Pager: [phone] After Hours Pager: [phone]

## 2017-02-18 DIAGNOSIS — R410 Disorientation, unspecified: Secondary | ICD-10-CM

## 2017-02-18 DIAGNOSIS — F05 Delirium due to known physiological condition: Secondary | ICD-10-CM

## 2017-02-18 LAB — BASIC METABOLIC PANEL
Anion gap: 6 (ref 5–15)
BUN: 10 mg/dL (ref 6–20)
CALCIUM: 8.9 mg/dL (ref 8.9–10.3)
CO2: 23 mmol/L (ref 22–32)
CREATININE: 0.77 mg/dL (ref 0.44–1.00)
Chloride: 111 mmol/L (ref 101–111)
GFR calc Af Amer: >60 mL/min (ref >60)
GFR calc non Af Amer: >60 mL/min (ref >60)
GLUCOSE: 90 mg/dL (ref 65–99)
Potassium: 3.7 mmol/L (ref 3.5–5.1)
Sodium: 140 mmol/L (ref 135–145)

## 2017-02-18 MED ORDER — SODIUM CHLORIDE 0.9 % IV SOLN
INTRAVENOUS | Status: AC
  Administered 2017-02-18: 02:00:00 via INTRAVENOUS

## 2017-02-18 NOTE — Progress Notes (Addendum)
MEDICATION RELATED CONSULT NOTE - INITIAL   Pharmacy Consult for electrolyte management Indication: hypokalemia  Allergies  Allergen Reactions  . No Known Allergies     Patient Measurements: Height: '5\' 4"'$  (162.6 cm) Weight: 122 lb 11.2 oz (55.7 kg) (bed scale) IBW/kg (Calculated) : 54.7 Adjusted Body Weight:   Vital Signs: Temp: 97.4 F (36.3 C) (02/28 0300) Temp Source: Oral (02/28 0300) BP: 110/67 (02/28 0300) Pulse Rate: 85 (02/28 0300) Intake/Output from previous day: 02/27 0701 - 02/28 0700 In: 846.3 [I.V.:346.3; IV Piggyback:500] Out: -  Intake/Output from this shift: Total I/O In: 746.3 [I.V.:246.3; IV Piggyback:500] Out: -   Labs:  Recent Labs  02/15/17 0612 02/16/17 0431 02/17/17 0309 02/18/17 0331  CREATININE  --   --  0.73 0.77  MG 1.1* 1.3* 1.9  --    Estimated Creatinine Clearance: 70.2 mL/min (by C-G formula based on SCr of 0.77 mg/dL).   Microbiology: No results found for this or any previous visit (from the past 720 hour(s)).  Medical History: Past Medical History:  Diagnosis Date  . COPD (chronic obstructive pulmonary disease) ([place])   . Hypertension   . Lung cancer ([place])   . Thyroid disease     Medications:  Scheduled:  . enoxaparin (LOVENOX) injection  40 mg Subcutaneous Q24H  . feeding supplement  1 Container Oral BID BM  . feeding supplement (ENSURE ENLIVE)  237 mL Oral TID WC  . folic acid  1 mg Oral Daily  . levothyroxine  100 mcg Oral QAC breakfast  . losartan  100 mg Oral Daily  . mouth rinse  15 mL Mouth Rinse BID  . megestrol  400 mg Oral BID  . metoprolol tartrate  50 mg Oral BID  . naLOXone (NARCAN)  injection  0.4 mg Intravenous Once  . nystatin  5 mL Oral QID  . pantoprazole sodium  40 mg Oral Daily  . senna  1 tablet Oral Daily  . thiamine (B-1) 250 mg in NS 0.9% 100 mL    Intravenous Daily  . tiotropium  18 mcg Inhalation Daily    Assessment: Patient admitted for hypothyroidism and AMS found to be hypokalemic  of K+ 3.1 And hypomagnesemic w/ Mg 1.1 - 1.2 -- was replaced and now Mg is at goal.  Goal of Therapy:  K+ 3.5 - 5.0 Mg 1.8 - 2.2  Plan:  K 3.7, Ca 8.9, magnesium and phosphorus not assessed. No further supplement warranted at this time. Will reassess tomorrow with AM labs.   Thank you for this consult.  Jonael Paradiso A. Jordan Hawks, PharmD, BCPS Clinical Pharmacist 02/18/2017

## 2017-02-18 NOTE — Progress Notes (Signed)
AC unable to insert peripheral iv. Notified Dr Marcille Blanco. Order obtained for PICC line insertion.

## 2017-02-18 NOTE — Progress Notes (Signed)
[facility] [facility] [place] NAME: Audrey Mcgee    MR#:  [id number]  DATE OF BIRTH:  1961-02-14  SUBJECTIVE:   Patient is here due to altered mental status and noted to be severely hypothyroid.  Remains confused and hallucinating at times. Did not sleep well again last night.  PO intake is fair.   REVIEW OF SYSTEMS:    Review of Systems  Constitutional: Negative for chills and fever.  HENT: Negative for congestion and tinnitus.   Eyes: Negative for blurred vision and double vision.  Respiratory: Negative for cough, shortness of breath and wheezing.   Cardiovascular: Negative for chest pain, orthopnea and PND.  Gastrointestinal: Negative for abdominal pain, diarrhea, nausea and vomiting.  Genitourinary: Negative for dysuria and hematuria.  Neurological: Positive for weakness. Negative for dizziness, sensory change and focal weakness.  Psychiatric/Behavioral: Positive for hallucinations.  All other systems reviewed and are negative.   Nutrition: Regular Tolerating Diet: Yes Tolerating PT: Eval noted.   DRUG ALLERGIES:   Allergies  Allergen Reactions  . No Known Allergies     VITALS:  Blood pressure 107/71, pulse 92, temperature 98.4 F (36.9 C), temperature source Oral, resp. rate 20, height '5\' 4"'$  (1.626 m), weight 55.7 kg (122 lb 11.2 oz), last menstrual period 01/30/2008, SpO2 100 %.  PHYSICAL EXAMINATION:   Physical Exam  GENERAL:  56 y.o.-year-old patient lying in bed in NAD.  EYES: Pupils equal, round, reactive to light and accommodation. No scleral icterus. Extraocular muscles intact.  HEENT: Head atraumatic, normocephalic. Oropharynx and nasopharynx clear.  NECK:  Supple, no jugular venous distention. No thyroid enlargement, no tenderness.  LUNGS: Normal breath sounds bilaterally, no wheezing, rales, rhonchi. No use of accessory muscles of respiration.  CARDIOVASCULAR: S1, S2 normal. No murmurs, rubs, or gallops.  ABDOMEN:  Soft, nontender, nondistended. Bowel sounds present. No organomegaly or mass.  EXTREMITIES: No cyanosis, clubbing or edema b/l.    NEUROLOGIC: Cranial nerves II through XII are intact. No focal Motor or sensory deficits b/l.  Globally weak.  PSYCHIATRIC: The patient is alert and oriented x 2.  SKIN: No obvious rash, lesion, or ulcer.    LABORATORY PANEL:   CBC  Recent Labs Lab 02/12/17 0416  WBC 6.7  HGB 12.8  HCT 37.3  PLT 249   ------------------------------------------------------------------------------------------------------------------  Chemistries   Recent Labs Lab 02/12/17 0416  02/17/17 0309 02/18/17 0331  NA 139  < > 139 140  K 3.6  < > 3.1* 3.7  CL 111  < > 109 111  CO2 19*  < > 24 23  GLUCOSE 103*  < > 114* 90  BUN 9  < > 10 10  CREATININE 0.92  < > 0.73 0.77  CALCIUM 8.1*  < > 9.0 8.9  MG  --   < > 1.9  --   AST 15  --   --   --   ALT 6*  --   --   --   ALKPHOS 57  --   --   --   BILITOT 0.8  --   --   --   < > = values in this interval not displayed. ------------------------------------------------------------------------------------------------------------------  Cardiac Enzymes No results for input(s): TROPONINI in the last 168 hours. ------------------------------------------------------------------------------------------------------------------  RADIOLOGY:  No results found.   ASSESSMENT AND PLAN:   56 year old female with past medical history of lung cancer, hypertension, hypothyroidism, COPD who presented to the hospital due to generalized weakness, poor by mouth  intake and noted to have dysphagia and also noted to be severely hypothyroid.  1. Altered mental status-metabolic encephalopathy secondary to severe hypothyroidism, Vitamin B-1 deficiency and also lack of sleep.  - still having periods of confusion.  No agitation.    -Continue Synthroid, vitamin B-1 supplements, trazodone for sleep. Avoid benzo's, PRN haldol for agitation.  -  ?? Underlying Psych diagnosis. Will get reeval by Psych.   2. Severe Hypothyroidism-Continue Synthroid. Follow TSH and thyroid function tests in a few weeks and outpatient Endocrine referral.   3. Intermittent N/V - resolved.  No N/V and no acute issues presently. Tolerating PO well.   4. Adult Failure to thrive - cont. Megace, supplements.  - appetite much improved.   5. Stage IV Lung Cancer - MRI Brain (-) for metastatic disease.  - No treatment since Nov'17.  Cont. Follow up with Oncology as outpatient.   6. Generalized weakness - multifactorial and related to #2 & #5.  - seen by PT and they recommend SNF but pt. And family do not want SNF.  - will need home health prior to discharge.   7. Thiamine Deficiency - cont. IV Thiamine.  Appreciate neuro input.    8. Hypokalemia/Hypomagnesemia - improved and resolved w/ supplementation.    All the records are reviewed and case discussed with Care Management/Social Worker. Management plans discussed with the patient, family and they are in agreement.  CODE STATUS: Full Code  DVT Prophylaxis: Lovenox  TOTAL TIME TAKING CARE OF THIS PATIENT:  30 minutes.   POSSIBLE D/C IN 2-3 DAYS, DEPENDING ON CLINICAL CONDITION.   [name] M.D on 02/18/2017 at 1:41 PM  Between 7am to 6pm - Pager - [phone]  After 6pm go to [email or web address] - Proofreader  [facility]  Office  [phone]  CC: Primary care physician; Donnie Coffin, MD

## 2017-02-18 NOTE — Progress Notes (Signed)
Earlier in the night pt agitated and cursing. Haldol 2.5 mg given. Afterwards, pt able to sleep.  Also, Pt with anuria. MD notified. 346 ml per bladder scan. MD did not wish to order in and out cath at that time. Around 3 am pt had bm and urinated. Pt also lost her iv access at that time. This writer attempted x 2 without success. AC notified for restart. Will continue to monitor.

## 2017-02-18 NOTE — Progress Notes (Signed)
Daily Progress Note   Patient Name: Audrey Mcgee       Date: 02/18/2017 DOB: 1961-09-21  Age: 56 y.o. MRN#: [id number] Attending Physician: [name], MD Primary Care Physician: Donnie Coffin, MD Admit Date: 02/06/2017  Reason for Consultation/Follow-up: Establishing goals of care  Subjective: Patient awake and alert. Denies pain or discomfort. Oral intake has improved.   Daughter, Audrey Mcgee at bedside. She plans to be primary caregiver for her mother on discharge. She is agreeable with palliative services to follow outpatient. I encouraged the family continue to discuss and complete advanced directives in the future, since the patient is able to speak of her wishes. Audrey Mcgee tells stories of her mother and speaks of her excitement of throwing a birthday party for her on Saturday when she is discharged.   Length of Stay: 7  Current Medications: Scheduled Meds:  . enoxaparin (LOVENOX) injection  40 mg Subcutaneous Q24H  . feeding supplement  1 Container Oral BID BM  . feeding supplement (ENSURE ENLIVE)  237 mL Oral TID WC  . folic acid  1 mg Oral Daily  . levothyroxine  100 mcg Oral QAC breakfast  . losartan  100 mg Oral Daily  . mouth rinse  15 mL Mouth Rinse BID  . megestrol  400 mg Oral BID  . metoprolol tartrate  50 mg Oral BID  . naLOXone (NARCAN)  injection  0.4 mg Intravenous Once  . nystatin  5 mL Oral QID  . pantoprazole sodium  40 mg Oral Daily  . senna  1 tablet Oral Daily  . thiamine (B-1) 250 mg in NS 0.9% 100 mL    Intravenous Daily  . tiotropium  18 mcg Inhalation Daily    Continuous Infusions:   PRN Meds: acetaminophen **OR** acetaminophen, benzonatate, haloperidol lactate, ipratropium-albuterol, lidocaine, ondansetron **OR** ondansetron (ZOFRAN) IV,  oxyCODONE, sodium chloride, traZODone  Physical Exam  Constitutional: She is cooperative.  HENT:  Head: Normocephalic and atraumatic.  Cardiovascular: An irregularly irregular rhythm present.  Pulmonary/Chest: Effort normal. She has decreased breath sounds.  Abdominal: Bowel sounds are normal. She exhibits distension. There is no tenderness.  Neurological: She is alert.  Skin: Skin is warm and dry.  Psychiatric: Her speech is delayed. She is inattentive.  Nursing note and vitals reviewed.  Vital Signs: BP 107/71 (BP Location: Right Arm)   Pulse 92   Temp 98.4 F (36.9 C) (Oral)   Resp 18   Ht '5\' 4"'$  (1.626 m)   Wt 55.7 kg (122 lb 11.2 oz) Comment: bed scale  LMP 01/30/2008 Comment: 9 yrs ago  SpO2 100%   BMI 21.06 kg/m  SpO2: SpO2: 100 % O2 Device: O2 Device: Not Delivered O2 Flow Rate:    Intake/output summary:   Intake/Output Summary (Last 24 hours) at 02/18/17 1723 Last data filed at 02/18/17 1134  Gross per 24 hour  Intake           952.58 ml  Output              550 ml  Net           402.58 ml   LBM: Last BM Date: 02/18/17 Baseline Weight: Weight: 49.9 kg (110 lb) Most recent weight: Weight: 55.7 kg (122 lb 11.2 oz) (bed scale)  Palliative Assessment/Data: PPS 30%   Flowsheet Rows   Flowsheet Row Most Recent Value  Intake Tab  Referral Department  Hospitalist  Unit at Time of Referral  Oncology Unit  Palliative Care Primary Diagnosis  Cancer  Date Notified  02/08/17  Palliative Care Type  New Palliative care  Reason for referral  Clarify Goals of Care  Date of Admission  02/06/17  Date first seen by Palliative Care  02/10/17  # of days IP prior to Palliative referral  2  Clinical Assessment  Palliative Performance Scale Score  30%  Psychosocial & Spiritual Assessment  Palliative Care Outcomes  Patient/Family meeting held?  Yes  Who was at the meeting?  patient, brother, two daughters  Palliative Care Outcomes  Clarified goals of care,  Provided psychosocial or spiritual support, Counseled regarding hospice, ACP counseling assistance, Provided end of life care assistance      Patient Active Problem List   Diagnosis Date Noted  . Altered mental status 02/12/2017  . Cannabis abuse 02/12/2017  . Nausea 02/12/2017  . Failure to thrive in adult 02/12/2017  . Hypokalemia 02/12/2017  . Generalized weakness 02/12/2017  . Lung cancer ([place]) 02/12/2017  . Hypothyroidism 02/11/2017  . Malignancy ([place])   . Palliative care by specialist   . Goals of care, counseling/discussion   . DNR (do not resuscitate) discussion   . Depressive disorder   . Dehydration 02/07/2017  . Weight loss 02/07/2017  . Encounter for antineoplastic chemotherapy 02/01/2017  . 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 Sepsis ([place]) 11/07/2016  . Protein-calorie malnutrition, severe 11/07/2016  . Acute respiratory distress 06/21/2016  . COPD exacerbation ([place]) 06/21/2016  . Sinus tachycardia 06/21/2016  . Recurrent aspiration bronchitis/pneumonia ([place]) 06/21/2016  . Dysphagia 06/21/2016  . Nausea and vomiting 06/21/2016  . Dental abscess 05/10/2016  . Swallowing difficulty 03/28/2016  . Hypomagnesemia 02/29/2016  . Hoarseness 02/18/2016  . Cancer related pain 07/08/2015  . Adenocarcinoma, lung ([place]) 03/26/2015  . 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 Ear ache 10/03/2014  . Abscess of buccal cavity 10/03/2014  . Lump in neck 10/03/2014  . Laryngeal pain 10/03/2014    Palliative Care Assessment & Plan   Patient Profile: 56 y.o. female  with past medical history of hypertension, thyroid disease, COPD, and lung cancer admitted on 02/06/2017 with weakness and poor oral intake. Patient followed by Dr. Mike Gip outpatient for metastatic poorly differentiated adenocarcinoma of lung. PET scan on 01/29/17 revealed abnormal adenopathy in neck, chest, and  upper abdomen suspicious for metastatic lung cancer. Patient defers biopsy.  Last chemo November 2017. In ED, chest xray negative. CT scan head negative for acute infarct or abnormality. Patient with dehydration and failure to thrive. Palliative medicine consultation for goals of care.    Assessment: Altered mental status  Dehydration Hypothyroidism Stage IV adenocarcinoma of lung Depression Failure to thrive  Recommendations/Plan:  Encouraged daughter to continue conversations with patient/family regarding advanced directives.   Agreeable with palliative services to follow outpatient.   PMT will continue to shadow chart and support patient/family through hospitalization.   Goals of Care and Additional Recommendations:  Limitations on Scope of Treatment: Full Scope Treatment  Code Status: FULL   Code Status Orders        Start     Ordered   02/07/17 1026  Full code  Continuous     02/07/17 1026    Code Status History    Date Active Date Inactive Code Status Order ID Comments User Context   11/07/2016  6:18 AM 11/11/2016  2:39 PM Full Code [id number]  Harrie Foreman, MD Inpatient   06/21/2016 11:52 AM 06/22/2016  3:07 PM Full Code [id number]  [name], MD Inpatient       Prognosis:   Unable to determine  Discharge Planning:  To Be Determined likely home with home health/palliative services  Care plan was discussed with patient, daughter, and RN  Thank you for allowing the Palliative Medicine Team to assist in the care of this patient.   Time In/Out: 4314-2767   Total Time 11mn Prolonged Time Billed  no       Greater than 50%  of this time was spent counseling and coordinating care related to the above assessment and plan.  MIhor Dow FNP-C Palliative Medicine Team  Phone: [phone]: [phone] Please contact Palliative Medicine Team phone at [phone] questions and concerns.

## 2017-02-18 NOTE — Consult Note (Signed)
[facility] Consult   Reason for Consult:  Consult for 56 year old woman with advanced lung cancer. Question was raised about any other mental health issues prior to discharge. Referring Physician:  Verdell Carmine Patient Identification: Audrey Mcgee MRN:  [id number] Principal Diagnosis: <principal problem not specified> Diagnosis:   Patient Active Problem List   Diagnosis Date Noted  . Altered mental status [R41.82] 02/12/2017  . Cannabis abuse [F12.10] 02/12/2017  . Nausea [R11.0] 02/12/2017  . Failure to thrive in adult [R62.7] 02/12/2017  . Hypokalemia [E87.6] 02/12/2017  . Generalized weakness [R53.1] 02/12/2017  . Lung cancer ([place]) [C34.90] 02/12/2017  . Hypothyroidism [E03.9] 02/11/2017  . Malignancy ([place]) [C80.1]   . Palliative care by specialist [Z51.5]   . Goals of care, counseling/discussion [Z71.89]   . DNR (do not resuscitate) discussion [Z71.89]   . Depressive disorder [F32.9]   . Dehydration [E86.0] 02/07/2017  . Weight loss [R63.4] 02/07/2017  . Encounter for antineoplastic chemotherapy [Z51.11] 02/01/2017  . 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 Sepsis ([place]) [A41.9] 11/07/2016  . Protein-calorie malnutrition, severe [E43] 11/07/2016  . Acute respiratory distress [R06.03] 06/21/2016  . COPD exacerbation ([place]) [J44.1] 06/21/2016  . Sinus tachycardia [R00.0] 06/21/2016  . Recurrent aspiration bronchitis/pneumonia ([place]) [J69.0] 06/21/2016  . Dysphagia [R13.10] 06/21/2016  . Nausea and vomiting [R11.2] 06/21/2016  . Dental abscess [K04.7] 05/10/2016  . Swallowing difficulty [R13.10] 03/28/2016  . Hypomagnesemia [E83.42] 02/29/2016  . Hoarseness [R49.0] 02/18/2016  . Cancer related pain [G89.3] 07/08/2015  . Adenocarcinoma, lung ([place]) [C34.90] 03/26/2015  . 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 Ear ache [H92.09] 10/03/2014  . Abscess of buccal cavity [K12.2] 10/03/2014  . Lump in neck [R22.1]  10/03/2014  . Laryngeal pain [R07.0] 10/03/2014    Total Time spent with patient: 30 minutes  Subjective:   Audrey Mcgee is a 56 y.o. female patient admitted with patient was not able to speak.  HPI:  Patient seen. Chart reviewed. This is a 56 year old woman with advanced lung cancer who is going to be discharged to hospice. End-stage with multiple severe medical problems. Question was raised about mental status changes. This patient was seen by psychiatry earlier in her hospital stay and the only treatment proposed was when necessary antipsychotics. On evaluation today the patient was not able to answer any of my questions. She was able to open her eyes when I spoke to her but did not make any coherent responses. She was very withdrawn. Hardly move at all. Not able to follow basic instructions. Fell back asleep very quickly. Affect flat. Did not appear to be in great distress.  Medical history: Advanced lung cancer multiple medical problems  Substance abuse history: Past history apparently of cannabis abuse.  Social history: Did not speak any family. It sounds like she is likely to be discharged with her daughter from what I can see with home health care.  Past Psychiatric History: Doesn't appear to have any past psychiatric history of any significance. No history of depression or mental health treatment that I can find in the past  Risk to Self: Is patient at risk for suicide?: No Risk to Others:   Prior Inpatient Therapy:   Prior Outpatient Therapy:    Past Medical History:  Past Medical History:  Diagnosis Date  . COPD (chronic obstructive pulmonary disease) ([place])   . Hypertension   . Lung cancer ([place])   . Thyroid disease     Past Surgical History:  Procedure Laterality  Date  . CESAREAN SECTION CLASSICAL    . STOMACH SURGERY     Pt reports for a tumor   Family History:  Family History  Problem Relation Age of Onset  . Hypertension Father   . Cancer Father   .  Diabetes Father   . Cancer Maternal Aunt    Family Psychiatric  History: None known Social History:  History  Alcohol Use  . 1.2 oz/week  . 2 Cans of beer per week     History  Drug Use No    Social History   Social History  . Marital status: Single    Spouse name: N/A  . Number of children: N/A  . Years of education: N/A   Social History Main Topics  . Smoking status: Current Every Day Smoker    Years: 15.00    Types: Cigarettes  . Smokeless tobacco: Never Used  . Alcohol use 1.2 oz/week    2 Cans of beer per week  . Drug use: No  . Sexual activity: Not Asked   Other Topics Concern  . None   Social History Narrative  . None   Additional Social History:    Allergies:   Allergies  Allergen Reactions  . No Known Allergies     Labs:  Results for orders placed or performed during the hospital encounter of 02/06/17 (from the past 48 hour(s))  Magnesium     Status: None   Collection Time: 02/17/17  3:09 AM  Result Value Ref Range   Magnesium 1.9 1.7 - 2.4 mg/dL  Basic metabolic panel     Status: Abnormal   Collection Time: 02/17/17  3:09 AM  Result Value Ref Range   Sodium 139 135 - 145 mmol/L   Potassium 3.1 (L) 3.5 - 5.1 mmol/L   Chloride 109 101 - 111 mmol/L   CO2 24 22 - 32 mmol/L   Glucose, Bld 114 (H) 65 - 99 mg/dL   BUN 10 6 - 20 mg/dL   Creatinine, Ser 0.73 0.44 - 1.00 mg/dL   Calcium 9.0 8.9 - 10.3 mg/dL   GFR calc non Af Amer >60 >60 mL/min   GFR calc Af Amer >60 >60 mL/min    Comment: (NOTE) The eGFR has been calculated using the CKD EPI equation. This calculation has not been validated in all clinical situations. eGFR's persistently <60 mL/min signify possible Chronic Kidney Disease.    Anion gap 6 5 - 15  Basic metabolic panel     Status: None   Collection Time: 02/18/17  3:31 AM  Result Value Ref Range   Sodium 140 135 - 145 mmol/L   Potassium 3.7 3.5 - 5.1 mmol/L   Chloride 111 101 - 111 mmol/L   CO2 23 22 - 32 mmol/L   Glucose,  Bld 90 65 - 99 mg/dL   BUN 10 6 - 20 mg/dL   Creatinine, Ser 0.77 0.44 - 1.00 mg/dL   Calcium 8.9 8.9 - 10.3 mg/dL   GFR calc non Af Amer >60 >60 mL/min   GFR calc Af Amer >60 >60 mL/min    Comment: (NOTE) The eGFR has been calculated using the CKD EPI equation. This calculation has not been validated in all clinical situations. eGFR's persistently <60 mL/min signify possible Chronic Kidney Disease.    Anion gap 6 5 - 15    Current Facility-Administered Medications  Medication Dose Route Frequency Provider Last Rate Last Dose  . acetaminophen (TYLENOL) tablet 650 mg  650 mg Oral  Q6H PRN Saundra Shelling, MD   650 mg at 02/13/17 0215   Or  . acetaminophen (TYLENOL) suppository 650 mg  650 mg Rectal Q6H PRN Saundra Shelling, MD      . benzonatate (TESSALON) capsule 100 mg  100 mg Oral TID PRN Saundra Shelling, MD      . enoxaparin (LOVENOX) injection 40 mg  40 mg Subcutaneous Q24H Saundra Shelling, MD   40 mg at 02/17/17 2203  . feeding supplement (BOOST / RESOURCE BREEZE) liquid 1 Container  1 Container Oral BID BM [name], MD   1 Container at 02/18/17 1113  . feeding supplement (ENSURE ENLIVE) (ENSURE ENLIVE) liquid 237 mL  237 mL Oral TID WC [name], MD   237 mL at 02/18/17 1505  . folic acid (FOLVITE) tablet 1 mg  1 mg Oral Daily Loree Fee, RPH   1 mg at 02/18/17 1113  . haloperidol lactate (HALDOL) injection 2.5 mg  2.5 mg Intravenous Q6H PRN [name], MD   2.5 mg at 02/17/17 2217  . ipratropium-albuterol (DUONEB) 0.5-2.5 (3) MG/3ML nebulizer solution 3 mL  3 mL Nebulization Q4H PRN [name], MD      . levothyroxine (SYNTHROID, LEVOTHROID) tablet 100 mcg  100 mcg Oral QAC breakfast [name], MD   100 mcg at 02/18/17 0541  . lidocaine (XYLOCAINE) 2 % viscous mouth solution 10 mL  10 mL Mouth/Throat Q4H PRN Pavan Pyreddy, MD      . losartan (COZAAR) tablet 100 mg  100 mg Oral Daily Saundra Shelling, MD   100 mg at 02/16/17 0927  . MEDLINE mouth rinse  15 mL  Mouth Rinse BID [name], MD   15 mL at 02/17/17 1047  . megestrol (MEGACE) 400 MG/10ML suspension 400 mg  400 mg Oral BID Saundra Shelling, MD   400 mg at 02/18/17 1114  . metoprolol (LOPRESSOR) tablet 50 mg  50 mg Oral BID Alexis Hugelmeyer, DO   50 mg at 02/18/17 1114  . naloxone (NARCAN) injection 0.4 mg  0.4 mg Intravenous Once [name], MD      . nystatin (MYCOSTATIN) 100000 UNIT/ML suspension 500,000 Units  5 mL Oral QID [name], MD   500,000 Units at 02/18/17 1114  . ondansetron (ZOFRAN) tablet 4 mg  4 mg Oral Q6H PRN Saundra Shelling, MD   4 mg at 02/07/17 0920   Or  . ondansetron (ZOFRAN) injection 4 mg  4 mg Intravenous Q6H PRN Saundra Shelling, MD   4 mg at 02/10/17 0546  . oxyCODONE (Oxy IR/ROXICODONE) immediate release tablet 5 mg  5 mg Oral Q4H PRN [name], MD   5 mg at 02/16/17 0927  . pantoprazole sodium (PROTONIX) 40 mg/20 mL oral suspension 40 mg  40 mg Oral Daily Lenis Noon, RPH   40 mg at 02/18/17 1114  . senna (SENOKOT) tablet 8.6 mg  1 tablet Oral Daily [name], MD   8.6 mg at 02/18/17 1113  . sodium chloride 0.9 % bolus 1,000 mL  1,000 mL Intravenous PRN [name], MD      . thiamine (B-1) 250 mg in NS 0.9% 100 mL    Intravenous Daily [name], MD   Stopped at 02/18/17 0912  . tiotropium (SPIRIVA) inhalation capsule 18 mcg  18 mcg Inhalation Daily Saundra Shelling, MD   18 mcg at 02/18/17 1119  . traZODone (DESYREL) tablet 50 mg  50 mg Oral QHS PRN [name], MD  50 mg at 02/17/17 2200    Musculoskeletal: Strength & Muscle Tone: atrophy Gait & Station: unable to stand Patient leans: N/A  Psychiatric Specialty Exam: Physical Exam  Constitutional: She appears cachectic. She has a sickly appearance. She appears ill.  Psychiatric: Her affect is blunt. Her speech is delayed. She is slowed and withdrawn. She is noncommunicative. She exhibits abnormal remote memory.    Review of Systems  Unable to perform ROS: Medical condition     Blood pressure 107/71, pulse 92, temperature 98.4 F (36.9 C), temperature source Oral, resp. rate 18, height _0  (1.626 m), weight 55.7 kg (122 lb 11.2 oz), last menstrual period 01/30/2008, SpO2 100 %.Body mass index is 21.06 kg/m.  General Appearance: NA  Eye Contact:  Minimal  Speech:  Garbled  Volume:  Decreased  Mood:  Negative  Affect:  Negative  Thought Process:  NA  Orientation:  Negative  Thought Content:  Negative  Suicidal Thoughts:  No  Homicidal Thoughts:  No  Memory:  Negative  Judgement:  Negative  Insight:  Negative  Psychomotor Activity:  Negative  Concentration:  Concentration: Negative  Recall:  Negative  Fund of Knowledge:  Negative  Language:  Negative  Akathisia:  Negative  Handed:  Right  AIMS (if indicated):     Assets:  Social Support  ADL's:  Impaired  Cognition:  Impaired,  Severe  Sleep:        Treatment Plan Summary: Plan 56 year old woman. End-stage illness. Delirium and dementia related to multiple medical problems. I agree with simply when necessary medicine for safety if agitated. A question could be raised as to whether antidepressants would be appropriate. We don't have any direct evidence of depression. Given her terminal condition it's unlikely that they will make any difference to quality of life. Risk of side effects is present. Seems more judicious not to start any other medicine at this point. Sign off any further psychiatric care unless specifically needed.  Disposition: No evidence of imminent risk to self or others at present.    Alethia Berthold, MD 02/18/2017 6:08 PM

## 2017-02-19 LAB — BASIC METABOLIC PANEL
ANION GAP: 7 (ref 5–15)
BUN: 9 mg/dL (ref 6–20)
CO2: 23 mmol/L (ref 22–32)
Calcium: 8.9 mg/dL (ref 8.9–10.3)
Chloride: 109 mmol/L (ref 101–111)
Creatinine, Ser: 0.76 mg/dL (ref 0.44–1.00)
GFR calc Af Amer: >60 mL/min (ref >60)
GLUCOSE: 109 mg/dL — AB (ref 65–99)
POTASSIUM: 3.3 mmol/L — AB (ref 3.5–5.1)
Sodium: 139 mmol/L (ref 135–145)

## 2017-02-19 LAB — PHOSPHORUS: Phosphorus: 4.1 mg/dL (ref 2.5–4.6)

## 2017-02-19 LAB — MAGNESIUM: Magnesium: 1.2 mg/dL — ABNORMAL LOW (ref 1.7–2.4)

## 2017-02-19 MED ORDER — MAGNESIUM SULFATE 4 GM/100ML IV SOLN
4.0000 g | Freq: Once | INTRAVENOUS | Status: AC
  Administered 2017-02-19: 4 g via INTRAVENOUS
  Filled 2017-02-19: qty 100

## 2017-02-19 MED ORDER — POTASSIUM CHLORIDE CRYS ER 20 MEQ PO TBCR
20.0000 meq | EXTENDED_RELEASE_TABLET | Freq: Once | ORAL | Status: AC
  Administered 2017-02-19: 15:00:00 20 meq via ORAL
  Filled 2017-02-19: qty 1

## 2017-02-19 MED ORDER — MAGNESIUM SULFATE 2 GM/50ML IV SOLN
2.0000 g | Freq: Once | INTRAVENOUS | Status: DC
  Filled 2017-02-19: qty 50

## 2017-02-19 MED ORDER — SODIUM CHLORIDE 0.9 % IV SOLN
30.0000 meq | Freq: Once | INTRAVENOUS | Status: DC
  Filled 2017-02-19: qty 15

## 2017-02-19 MED ORDER — POTASSIUM CHLORIDE CRYS ER 20 MEQ PO TBCR
20.0000 meq | EXTENDED_RELEASE_TABLET | Freq: Once | ORAL | Status: AC
  Administered 2017-02-19: 10:00:00 20 meq via ORAL
  Filled 2017-02-19: qty 1

## 2017-02-19 NOTE — Progress Notes (Signed)
MEDICATION RELATED CONSULT NOTE - INITIAL   Pharmacy Consult for electrolyte management Indication: hypokalemia  Allergies  Allergen Reactions  . No Known Allergies     Patient Measurements: Height: '5\' 4"'$  (162.6 cm) Weight: 122 lb 11.2 oz (55.7 kg) (bed scale) IBW/kg (Calculated) : 54.7 Adjusted Body Weight:   Vital Signs: Temp: 98.6 F (37 C) (03/01 0515) Temp Source: Oral (02/28 2031) BP: 105/72 (03/01 0515) Pulse Rate: 83 (03/01 0515) Intake/Output from previous day: 02/28 0701 - 03/01 0700 In: 98.3 [I.V.:98.3] Out: 550 [Urine:550] Intake/Output from this shift: No intake/output data recorded.  Labs:  Recent Labs  02/17/17 0309 02/18/17 0331 02/19/17 0500  CREATININE 0.73 0.77 0.76  MG 1.9  --  1.2*  PHOS  --   --  4.1   Estimated Creatinine Clearance: 70.2 mL/min (by C-G formula based on SCr of 0.76 mg/dL).   Microbiology: No results found for this or any previous visit (from the past 720 hour(s)).  Medical History: Past Medical History:  Diagnosis Date  . COPD (chronic obstructive pulmonary disease) ([place])   . Hypertension   . Lung cancer ([place])   . Thyroid disease     Medications:  Scheduled:  . enoxaparin (LOVENOX) injection  40 mg Subcutaneous Q24H  . feeding supplement  1 Container Oral BID BM  . feeding supplement (ENSURE ENLIVE)  237 mL Oral TID WC  . folic acid  1 mg Oral Daily  . levothyroxine  100 mcg Oral QAC breakfast  . losartan  100 mg Oral Daily  . mouth rinse  15 mL Mouth Rinse BID  . megestrol  400 mg Oral BID  . metoprolol tartrate  50 mg Oral BID  . naLOXone (NARCAN)  injection  0.4 mg Intravenous Once  . nystatin  5 mL Oral QID  . pantoprazole sodium  40 mg Oral Daily  . senna  1 tablet Oral Daily  . thiamine (B-1) 250 mg in NS 0.9% 100 mL    Intravenous Daily  . tiotropium  18 mcg Inhalation Daily    Assessment: Patient admitted for hypothyroidism and AMS found to be hypokalemic of K+ 3.1 And hypomagnesemic w/ Mg 1.1 -  1.2 -- was replaced and now Mg is at goal.  Goal of Therapy:  K+ 3.5 - 5.0 Mg 1.8 - 2.2  Plan:  K 3.7, Ca 8.9, magnesium and phosphorus not assessed. No further supplement warranted at this time. Will reassess tomorrow with AM labs.   3/1 0500 K 3.3, Ca 8.9, last albumin 2.9, adjusted Ca 9.8, phos 4.1, Mg 1.2. Give potassium chloride 20 mEq po x 1, magnesium sulfate 4 mg IV x 1, and recheck electrolytes tomorrow with AM labs.   Thank you for this consult.  Elizabethanne Lusher A. Jordan Hawks, PharmD, BCPS Clinical Pharmacist 02/19/2017

## 2017-02-19 NOTE — Discharge Instructions (Signed)
[facility] Stay Proper nutrition can help your body recover from illness and injury.   Foods and beverages high in protein, vitamins, and minerals help rebuild muscle loss, promote healing, & reduce fall risk.   In addition to eating healthy foods, a nutrition shake is an easy, delicious way to get the nutrition you need during and after your hospital stay  It is recommended that you continue to drink 3 bottles per day of:       Ensure Enlive or Carnation Instant Breakfast for at least 1 month (30 days) after your hospital stay   Tips for adding a nutrition shake into your routine: As allowed, drink one with vitamins or medications instead of water or juice Enjoy one as a tasty mid-morning or afternoon snack Drink cold or make a milkshake out of it Drink one instead of milk with cereal or snacks Use as a coffee creamer   Available at the following grocery stores and pharmacies:           * [facility] [phone]            For COUPONS visit: www.ensure.com/join or http://dawson-may.com/   Suggested Substitutions Ensure Plus = Boost Plus = Carnation Breakfast Essentials = Boost Compact Ensure Active Clear = Boost Breeze Glucerna Shake = Boost Glucose Control = Carnation Breakfast Essentials SUGAR FREE

## 2017-02-19 NOTE — Progress Notes (Signed)
MEDICATION RELATED CONSULT NOTE - INITIAL   Pharmacy Consult for electrolyte management Indication: hypokalemia      Allergies  Allergen Reactions  . No Known Allergies     Patient Measurements: Height: '5\' 4"'$  (162.6 cm) Weight: 122 lb 11.2 oz (55.7 kg) (bed scale) IBW/kg (Calculated) : 54.7 Adjusted Body Weight:   Vital Signs: Temp: 98.6 F (37 C) (03/01 0515) Temp Source: Oral (02/28 2031) BP: 105/72 (03/01 0515) Pulse Rate: 83 (03/01 0515) Intake/Output from previous day: 02/28 0701 - 03/01 0700 In: 98.3 [I.V.:98.3] Out: 550 [Urine:550] Intake/Output from this shift: No intake/output data recorded.  Labs:  Recent Labs (last 2 labs)    Recent Labs  02/17/17 0309 02/18/17 0331 02/19/17 0500  CREATININE 0.73 0.77 0.76  MG 1.9  --  1.2*  PHOS  --   --  4.1     Estimated Creatinine Clearance: 70.2 mL/min (by C-G formula based on SCr of 0.76 mg/dL).   Microbiology: No results found for this or any previous visit (from the past 720 hour(s)).  Medical History:     Past Medical History:  Diagnosis Date  . COPD (chronic obstructive pulmonary disease) ([place])   . Hypertension   . Lung cancer ([place])   . Thyroid disease     Medications:  Scheduled:  . enoxaparin (LOVENOX) injection  40 mg Subcutaneous Q24H  . feeding supplement  1 Container Oral BID BM  . feeding supplement (ENSURE ENLIVE)  237 mL Oral TID WC  . folic acid  1 mg Oral Daily  . levothyroxine  100 mcg Oral QAC breakfast  . losartan  100 mg Oral Daily  . mouth rinse  15 mL Mouth Rinse BID  . megestrol  400 mg Oral BID  . metoprolol tartrate  50 mg Oral BID  . naLOXone (NARCAN)  injection  0.4 mg Intravenous Once  . nystatin  5 mL Oral QID  . pantoprazole sodium  40 mg Oral Daily  . senna  1 tablet Oral Daily  . thiamine (B-1) 250 mg in NS 0.9% 100 mL    Intravenous Daily  . tiotropium  18 mcg Inhalation Daily    Assessment: Patient admitted for hypothyroidism and AMS  found to be hypokalemic of K+ 3.1 And hypomagnesemic w/ Mg 1.1 - 1.2 -- was replaced and now Mg is at goal.  Goal of Therapy:  K+ 3.5 - 5.0 Mg 1.8 - 2.2  Plan:  K 3.7, Ca 8.9, magnesium and phosphorus not assessed. No further supplement warranted at this time. Will reassess tomorrow with AM labs.   3/1 0500 K 3.3, Ca 8.9, last albumin 2.9, adjusted Ca 9.8, phos 4.1, Mg 1.2. Give potassium chloride 20 mEq po x 1, magnesium sulfate 4 mg IV x 1, and recheck electrolytes tomorrow with AM labs.   3/1 0830 PO potassium still not given -- in addition will give Kcl 30 mEq IV x 1 for a total expected K+ rise of 3.8. Will f/u w/ electrolytes on am labs.  Thank you for this consult.  Tobie Lords, PharmD, BCPS Clinical Pharmacist 02/19/2017

## 2017-02-19 NOTE — Progress Notes (Signed)
Nutrition Follow-up  DOCUMENTATION CODES:   Severe malnutrition in context of chronic illness  INTERVENTION:  Continue Ensure Enlive po TID, each supplement provides 350 kcal and 20 grams of protein. Reviewed Post-Discharge Nutrition with patient and copied into Discharge Instructions on Chart. Encouraged patient to drink Ensure, Boost, or other oral nutrition supplement (Carnation Instant Breakfast in milk) to help meet calorie/protein needs at home.  Patient's intake has significantly declined from last assessment and she is only taking bites of meals. Patient needs encouragement to eat each meal and drink her oral nutrition supplements.  NUTRITION DIAGNOSIS:   Inadequate oral intake related to poor appetite, cancer and cancer related treatments, nausea as evidenced by other (see comment) (per RN and SLP report).  Ongoing.  GOAL:   Patient will meet greater than or equal to 90% of their needs  Not met.   MONITOR:   PO intake, Supplement acceptance, Labs, I & O's, Weight trends  REASON FOR ASSESSMENT:   Consult Assessment of nutrition requirement/status  ASSESSMENT:   56 year old female with PMHx of HTN, COPD, lung cancer who presents with generalized weakness, nausea, poor oral intake.   -Per chart patient will be followed by [facility] at home after discharge. Plan is to discharge tomorrow after last IV thiamine dose.   Spoke with patient at bedside. No family present today. She reports her appetite is on and off. She has not had any Ensure yet today. Noted two bottles of Ensure on tray (one opened with straw in it but patient had not had any). Patient had only taken bites of meal present in room at time of assessment. Patient reports she likes the Ensure and would like to continue drinking it at home. Discussed other options in addition to Ensure because patient is concerned about cost. Patient reports she still has occasional nausea and abdominal pain.   Meal  Completion: bites of lunch today, otherwise meal completion not recorded since 2/26. Unable to truly estimate adequacy of patient's intake over the past 24 hours, but will likely not be adequate as she has only had bites of meals today and is not drinking her Ensure today.   Medications reviewed and include: folic acid 1 mg daily, levothyroxine, Megace 400 mg BID, pantoprazole, thiamine 250 mg daily IV (2/26-3/3).   Labs reviewed: Potassium 3.3, Magnesium 1.2. Phosphorus WNL.  Discussed with RN.   Diet Order:  Diet - low sodium heart healthy Diet regular Room service appropriate? Yes; Fluid consistency: Thin  Skin:  Reviewed, no issues  Last BM:  02/12/2017  Height:   Ht Readings from Last 1 Encounters:  02/07/17 _0  (1.626 m)    Weight:   Wt Readings from Last 1 Encounters:  02/09/17 122 lb 11.2 oz (55.7 kg)    Ideal Body Weight:  54.5 kg  BMI:  Body mass index is 21.06 kg/m.  Estimated Nutritional Needs:   Kcal:  1670-1950 (30-35 kcal/kg)  Protein:  85-95 grams (1.5-1.7 grams/kg)  Fluid:  1.6 L/day (30 ml/kg)  EDUCATION NEEDS:   Education needs no appropriate at this time  Willey Blade, MS, RD, LDN Pager: [phone] After Hours Pager: [phone]

## 2017-02-19 NOTE — Care Management (Signed)
Audrey Mcgee has lung cancer which requires her upper body to be positioned in ways not feasible with a normal bed. The head of the bed must be elevated 30 degrees or more or Audrey Mcgee will have more intense pain and respiratory difficulty. Shelbie Ammons RN MSN CCM Care Management

## 2017-02-19 NOTE — Progress Notes (Signed)
[facility] [place] NAME: Audrey Mcgee    MR#:  [id number]  DATE OF BIRTH:  11-20-1961  SUBJECTIVE:   Patient is here due to altered mental status and noted to be severely hypothyroid.  PO intake is fair. Mental status still waxes and wanes.    REVIEW OF SYSTEMS:    Review of Systems  Constitutional: Negative for chills and fever.  HENT: Negative for congestion and tinnitus.   Eyes: Negative for blurred vision and double vision.  Respiratory: Negative for cough, shortness of breath and wheezing.   Cardiovascular: Negative for chest pain, orthopnea and PND.  Gastrointestinal: Negative for abdominal pain, diarrhea, nausea and vomiting.  Genitourinary: Negative for dysuria and hematuria.  Neurological: Positive for weakness. Negative for dizziness, sensory change and focal weakness.  Psychiatric/Behavioral: Negative for hallucinations.  All other systems reviewed and are negative.   Nutrition: Regular Tolerating Diet: Yes Tolerating PT: Eval noted.   DRUG ALLERGIES:   Allergies  Allergen Reactions  . No Known Allergies     VITALS:  Blood pressure 107/68, pulse 85, temperature 98.8 F (37.1 C), temperature source Oral, resp. rate 18, height '5\' 4"'$  (1.626 m), weight 55.7 kg (122 lb 11.2 oz), last menstrual period 01/30/2008, SpO2 99 %.  PHYSICAL EXAMINATION:   Physical Exam  GENERAL:  56 y.o.-year-old cachectic patient lying in bed in NAD.  EYES: Pupils equal, round, reactive to light and accommodation. No scleral icterus. Extraocular muscles intact.  HEENT: Head atraumatic, normocephalic. Oropharynx and nasopharynx clear.  NECK:  Supple, no jugular venous distention. No thyroid enlargement, no tenderness.  LUNGS: Normal breath sounds bilaterally, no wheezing, rales, rhonchi. No use of accessory muscles of respiration.  CARDIOVASCULAR: S1, S2 normal. No murmurs, rubs, or gallops.  ABDOMEN: Soft, nontender, nondistended. Bowel  sounds present. No organomegaly or mass.  EXTREMITIES: No cyanosis, clubbing or edema b/l.    NEUROLOGIC: Cranial nerves II through XII are intact. No focal Motor or sensory deficits b/l.  Globally weak.  PSYCHIATRIC: The patient is alert and oriented x 2.  SKIN: No obvious rash, lesion, or ulcer.    LABORATORY PANEL:   CBC No results for input(s): WBC, HGB, HCT, PLT in the last 168 hours. ------------------------------------------------------------------------------------------------------------------  Chemistries   Recent Labs Lab 02/19/17 0500  NA 139  K 3.3*  CL 109  CO2 23  GLUCOSE 109*  BUN 9  CREATININE 0.76  CALCIUM 8.9  MG 1.2*   ------------------------------------------------------------------------------------------------------------------  Cardiac Enzymes No results for input(s): TROPONINI in the last 168 hours. ------------------------------------------------------------------------------------------------------------------  RADIOLOGY:  No results found.   ASSESSMENT AND PLAN:   56 year old female with past medical history of lung cancer, hypertension, hypothyroidism, COPD who presented to the hospital due to generalized weakness, poor by mouth intake and noted to have dysphagia and also noted to be severely hypothyroid.  1. Altered mental status-metabolic encephalopathy secondary to severe hypothyroidism, Vitamin B-1 deficiency and also lack of sleep.  - mental status still waxes and wanes but stable this a.m. Got some haldol yesterday at 8 p.m.  -Continue Synthroid, vitamin B-1 supplements, trazodone for sleep. Avoid benzo's, PRN haldol for agitation.  - appreciate Psych re-eval and no changes or meds or other management at this time.    2. Severe Hypothyroidism-Continue Synthroid. Follow TSH and thyroid function tests in a few weeks and outpatient Endocrine referral.   3. Intermittent N/V - resolved.  No N/V and no acute issues presently. Tolerating  PO well.  4. Adult Failure to thrive - cont. Megace, supplements.  - appetite much improved.   5. Stage IV Lung Cancer - MRI Brain (-) for metastatic disease.  - No treatment since Nov'17.  Cont. Follow up with Oncology as outpatient.   6. Generalized weakness - multifactorial and related to #2 & #5.  - seen by PT and they recommend SNF but pt. And family do not want SNF.  - will arrange Home Health services upon discharge. CM arranging home health equipment.   7. Thiamine Deficiency - last day of IV thiamine tomorrow and then d/c home on Oral supplements.   8. Hypokalemia/Hypomagnesemia - cont. To supplement and follow levels which have improved.    All the records are reviewed and case discussed with Care Management/Social Worker. Management plans discussed with the patient, family and they are in agreement.  CODE STATUS: Full Code  DVT Prophylaxis: Lovenox  TOTAL TIME TAKING CARE OF THIS PATIENT: 25  minutes.   POSSIBLE D/C IN 1-2 DAYS, DEPENDING ON CLINICAL CONDITION.   [name] M.D on 02/19/2017 at 12:29 PM  Between 7am to 6pm - Pager - [phone]  After 6pm go to [email or web address] - Proofreader  [facility]  Office  [phone]  CC: Primary care physician; Donnie Coffin, MD

## 2017-02-19 NOTE — Progress Notes (Signed)
Physical Therapy Treatment Patient Details Name: Audrey Mcgee MRN: [id number] DOB: [birth year] Today's Date: 02/19/2017    History of Present Illness Pt is a 56 y.o. female presenting to hospital with weakness, nausea, dizziness, lightheadedness, and confusion.  Pt admitted to hospital with AMS, failure to thrive, hypokalemia, and dehydration.  PMH includes COPD, metastatic stage 4 lung CA, htn, h/o chemotherapy.    PT Comments    Pt requesting to toilet upon PT entering room and agreeable to getting to commode.  After toileting pt adamant about getting back to bed, refused to do anything else with therapist, and verbally voiced her displeasure to therapist (regarding doing anything else with therapist) so session ended.  Will continue to progress pt per pt's willingness to participate with therapy.   Follow Up Recommendations  SNF     Equipment Recommendations  Wheelchair (measurements PT);Wheelchair cushion (measurements PT);3in1 (PT)    Recommendations for Other Services       Precautions / Restrictions Precautions Precautions: Fall Restrictions Weight Bearing Restrictions: No    Mobility  Bed Mobility Overal bed mobility: Needs Assistance Bed Mobility: Supine to Sit;Sit to Supine     Supine to sit: Supervision;HOB elevated Sit to supine: Supervision;HOB elevated   General bed mobility comments: use of bed rail; vc's to scoot up in bed  Transfers Overall transfer level: Needs assistance Equipment used: None Transfers: Stand Pivot Transfers   Stand pivot transfers: Min assist (bed to/from commode)       General transfer comment: vc's for safety; assist for managing pt's brief's for toileting  Ambulation/Gait             General Gait Details: pt adamantly refusing ambulation   Stairs            Wheelchair Mobility    Modified Rankin (Stroke Patients Only)       Balance Overall balance assessment: Needs assistance Sitting-balance  support: Bilateral upper extremity supported;Feet supported Sitting balance-Leahy Scale: Fair Sitting balance - Comments: static sitting   Standing balance support: Single extremity supported Standing balance-Leahy Scale: Fair Standing balance comment: standing with UE support on bed rail (for managing pt's briefs)                    Cognition   Behavior During Therapy: Flat affect Overall Cognitive Status:  (Oriented to person and place)                      Exercises      General Comments General comments (skin integrity, edema, etc.): No family present during session.      Pertinent Vitals/Pain Pain Assessment: No/denies pain    Home Living                      Prior Function            PT Goals (current goals can now be found in the care plan section) Acute Rehab PT Goals Patient Stated Goal: To return home PT Goal Formulation: With patient/family Time For Goal Achievement: 02/27/17 Potential to Achieve Goals: Fair Progress towards PT goals: Progressing toward goals    Frequency    Min 2X/week      PT Plan Current plan remains appropriate    Co-evaluation             End of Session Equipment Utilized During Treatment: Gait belt Activity Tolerance: Patient tolerated treatment well Patient left: in bed;with call bell/phone within reach;with  bed alarm set Nurse Communication: Mobility status;Precautions PT Visit Diagnosis: Muscle weakness (generalized) (M62.81);History of falling (Z91.81);Other abnormalities of gait and mobility (R26.89)     Time: 1610-1620 PT Time Calculation (min) (ACUTE ONLY): 10 min  Charges:  $Therapeutic Activity: 8-22 mins                    G CodesLeitha Bleak, PT 02/19/17, 4:49 PM [phone]

## 2017-02-19 NOTE — Care Management (Signed)
Audrey Mcgee suffers from lung cancer which impairs her ability to perform her daily activities of living in the home. A walker, cane or crutches  will not resolve the issue with performing her activities of daily living. A wheelchair will allow Audrey Mcgee to safely perform her daily activities. Audrey Mcgee can safely propel the wheelchair in the home or has a caregiver who can provide assistance. Shelbie Ammons RN MSN CCM Care Management

## 2017-02-20 LAB — BASIC METABOLIC PANEL
Anion gap: 8 (ref 5–15)
BUN: 11 mg/dL (ref 6–20)
CALCIUM: 8.9 mg/dL (ref 8.9–10.3)
CHLORIDE: 109 mmol/L (ref 101–111)
CO2: 22 mmol/L (ref 22–32)
CREATININE: 0.64 mg/dL (ref 0.44–1.00)
GFR calc Af Amer: >60 mL/min (ref >60)
GFR calc non Af Amer: >60 mL/min (ref >60)
GLUCOSE: 131 mg/dL — AB (ref 65–99)
Potassium: 3.5 mmol/L (ref 3.5–5.1)
Sodium: 139 mmol/L (ref 135–145)

## 2017-02-20 LAB — PHOSPHORUS: PHOSPHORUS: 3.6 mg/dL (ref 2.5–4.6)

## 2017-02-20 LAB — MAGNESIUM: Magnesium: 1.5 mg/dL — ABNORMAL LOW (ref 1.7–2.4)

## 2017-02-20 MED ORDER — MAGNESIUM SULFATE 2 GM/50ML IV SOLN
2.0000 g | Freq: Once | INTRAVENOUS | Status: AC
  Administered 2017-02-20: 2 g via INTRAVENOUS
  Filled 2017-02-20: qty 50

## 2017-02-20 MED ORDER — OXYCODONE HCL 5 MG PO TABS: 5.0000 mg | ORAL_TABLET | ORAL | Status: DC | PRN

## 2017-02-20 MED ORDER — POTASSIUM CHLORIDE CRYS ER 20 MEQ PO TBCR
20.0000 meq | EXTENDED_RELEASE_TABLET | Freq: Once | ORAL | Status: AC
  Administered 2017-02-20: 07:00:00 20 meq via ORAL
  Filled 2017-02-20: qty 1

## 2017-02-20 NOTE — Progress Notes (Signed)
[facility] [place] NAME: Audrey Mcgee    MR#:  [id number]  DATE OF BIRTH:  [birth year]  SUBJECTIVE:   Patient is here due to altered mental status and noted to be severely hypothyroid.  Still having periods of hallucinations but otherwise doing well. PO intake is fair. Likely d/c home with Home Health tomorrow.   REVIEW OF SYSTEMS:    Review of Systems  Constitutional: Negative for chills and fever.  HENT: Negative for congestion and tinnitus.   Eyes: Negative for blurred vision and double vision.  Respiratory: Negative for cough, shortness of breath and wheezing.   Cardiovascular: Negative for chest pain, orthopnea and PND.  Gastrointestinal: Negative for abdominal pain, diarrhea, nausea and vomiting.  Genitourinary: Negative for dysuria and hematuria.  Neurological: Positive for weakness. Negative for dizziness, sensory change and focal weakness.  Psychiatric/Behavioral: Negative for hallucinations.  All other systems reviewed and are negative.   Nutrition: Regular Tolerating Diet: Yes Tolerating PT: Eval noted.   DRUG ALLERGIES:   Allergies  Allergen Reactions  . No Known Allergies     VITALS:  Blood pressure 129/88, pulse 90, temperature 98.4 F (36.9 C), temperature source Oral, resp. rate 18, height '5\' 4"'$  (1.626 m), weight 55.7 kg (122 lb 11.2 oz), last menstrual period 01/30/2008, SpO2 100 %.  PHYSICAL EXAMINATION:   Physical Exam  GENERAL:  56 y.o.-year-old cachectic patient lying in bed in NAD.  EYES: Pupils equal, round, reactive to light and accommodation. No scleral icterus. Extraocular muscles intact.  HEENT: Head atraumatic, normocephalic. Oropharynx and nasopharynx clear.  NECK:  Supple, no jugular venous distention. No thyroid enlargement, no tenderness.  LUNGS: Normal breath sounds bilaterally, no wheezing, rales, rhonchi. No use of accessory muscles of respiration.  CARDIOVASCULAR: S1, S2 normal. No  murmurs, rubs, or gallops.  ABDOMEN: Soft, nontender, nondistended. Bowel sounds present. No organomegaly or mass.  EXTREMITIES: No cyanosis, clubbing or edema b/l.    NEUROLOGIC: Cranial nerves II through XII are intact. No focal Motor or sensory deficits b/l.  Globally weak.  PSYCHIATRIC: The patient is alert and oriented x 2.  SKIN: No obvious rash, lesion, or ulcer.    LABORATORY PANEL:   CBC No results for input(s): WBC, HGB, HCT, PLT in the last 168 hours. ------------------------------------------------------------------------------------------------------------------  Chemistries   Recent Labs Lab 02/20/17 0449  NA 139  K 3.5  CL 109  CO2 22  GLUCOSE 131*  BUN 11  CREATININE 0.64  CALCIUM 8.9  MG 1.5*   ------------------------------------------------------------------------------------------------------------------  Cardiac Enzymes No results for input(s): TROPONINI in the last 168 hours. ------------------------------------------------------------------------------------------------------------------  RADIOLOGY:  No results found.   ASSESSMENT AND PLAN:   56 year old female with past medical history of lung cancer, hypertension, hypothyroidism, COPD who presented to the hospital due to generalized weakness, poor by mouth intake and noted to have dysphagia and also noted to be severely hypothyroid.  1. Altered mental status-metabolic encephalopathy secondary to severe hypothyroidism, Vitamin B-1 deficiency and also lack of sleep.  - mental status still waxes and wanes but stable.  -Continue Synthroid, vitamin B-1 supplements, trazodone for sleep. Avoid benzo's, PRN haldol for agitation.  - appreciate Psych re-eval and no changes to meds or other management at this time.    2. Severe Hypothyroidism-Continue Synthroid. Follow TSH and thyroid function tests in a few weeks and outpatient Endocrine referral.   3. Intermittent N/V - resolved.  No N/V and no  acute issues presently. Tolerating PO well.  4. Adult Failure to thrive - cont. Megace, supplements.  - appetite much improved.   5. Stage IV Lung Cancer - MRI Brain (-) for metastatic disease.  - No treatment since Nov'17.  Cont. Follow up with Oncology as outpatient.   6. Generalized weakness - multifactorial and related to #2 & #5.  - seen by PT and they recommend SNF but pt. And family do not want SNF.  - discharge home with Home Health tomorrow.   7. Thiamine Deficiency - last day of IV thiamine tomorrow and then d/c home on Oral supplements.   8. Hypokalemia/Hypomagnesemia - improved and resolved w/ supplementation.     All the records are reviewed and case discussed with Care Management/Social Worker. Management plans discussed with the patient, family and they are in agreement.  CODE STATUS: Full Code  DVT Prophylaxis: Lovenox  TOTAL TIME TAKING CARE OF THIS PATIENT: 25  minutes.   POSSIBLE D/C IN 1-2 DAYS, DEPENDING ON CLINICAL CONDITION.   [name] M.D on 02/20/2017 at 1:46 PM  Between 7am to 6pm - Pager - [phone]  After 6pm go to [email or web address] - Proofreader  [name]  Office  [phone]  CC: Primary care physician; Donnie Coffin, MD

## 2017-02-20 NOTE — Progress Notes (Signed)
MEDICATION RELATED CONSULT NOTE - INITIAL   Pharmacy Consult for electrolyte management Indication: hypokalemia  Allergies  Allergen Reactions  . No Known Allergies     Patient Measurements: Height: '5\' 4"'$  (162.6 cm) Weight: 122 lb 11.2 oz (55.7 kg) (bed scale) IBW/kg (Calculated) : 54.7 Adjusted Body Weight:   Vital Signs: Temp: 98.4 F (36.9 C) (03/02 0404) Temp Source: Oral (03/02 0404) BP: 118/73 (03/02 0404) Pulse Rate: 97 (03/02 0404) Intake/Output from previous day: 03/01 0701 - 03/02 0700 In: -  Out: 150 [Urine:150] Intake/Output from this shift: No intake/output data recorded.  Labs:  Recent Labs  02/18/17 0331 02/19/17 0500 02/20/17 0449  CREATININE 0.77 0.76 0.64  MG  --  1.2* 1.5*  PHOS  --  4.1 3.6   Estimated Creatinine Clearance: 70.2 mL/min (by C-G formula based on SCr of 0.64 mg/dL).   Microbiology: No results found for this or any previous visit (from the past 720 hour(s)).  Medical History: Past Medical History:  Diagnosis Date  . COPD (chronic obstructive pulmonary disease) ([place])   . Hypertension   . Lung cancer ([place])   . Thyroid disease     Medications:  Scheduled:  . enoxaparin (LOVENOX) injection  40 mg Subcutaneous Q24H  . feeding supplement  1 Container Oral BID BM  . feeding supplement (ENSURE ENLIVE)  237 mL Oral TID WC  . folic acid  1 mg Oral Daily  . levothyroxine  100 mcg Oral QAC breakfast  . mouth rinse  15 mL Mouth Rinse BID  . megestrol  400 mg Oral BID  . metoprolol tartrate  50 mg Oral BID  . naLOXone (NARCAN)  injection  0.4 mg Intravenous Once  . nystatin  5 mL Oral QID  . pantoprazole sodium  40 mg Oral Daily  . senna  1 tablet Oral Daily  . thiamine (B-1) 250 mg in NS 0.9% 100 mL    Intravenous Daily  . tiotropium  18 mcg Inhalation Daily    Assessment: Patient admitted for hypothyroidism and AMS found to be hypokalemic of K+ 3.1 And hypomagnesemic w/ Mg 1.1 - 1.2 -- was replaced and now Mg is at  goal.  Goal of Therapy:  K+ 3.5 - 5.0 Mg 1.8 - 2.2  Plan:  K 3.7, Ca 8.9, magnesium and phosphorus not assessed. No further supplement warranted at this time. Will reassess tomorrow with AM labs.   3/1 0500 K 3.3, Ca 8.9, last albumin 2.9, adjusted Ca 9.8, phos 4.1, Mg 1.2. Give potassium chloride 20 mEq po x 1, magnesium sulfate 4 mg IV x 1, and recheck electrolytes tomorrow with AM labs.   3/2 0449 K 3.5, Ca 8.9, last albumin 2.9, adjusted Ca 9.8, phos 3.6, Mg 1.5. Give potassium chloride 20 mEq po x 1, magnesium sulfate 2 gm IV x 1, and recheck electrolytes tomorrow with AM labs.   Thank you for this consult.  Phyllis Abelson A. Jordan Hawks, PharmD, BCPS Clinical Pharmacist 02/20/2017

## 2017-02-20 NOTE — Plan of Care (Signed)
Problem: Education: Goal: Knowledge of [facility] Education information/materials will improve Outcome: Progressing VSS, free of falls during shift.  No complaints overnight, denies pain, nausea.  PIV infiltrated, new PIV placed by Ellyn Hack., RN.  Bed in low position, call bell within reach.  WCTM.

## 2017-02-21 LAB — BASIC METABOLIC PANEL
ANION GAP: 6 (ref 5–15)
BUN: 12 mg/dL (ref 6–20)
CHLORIDE: 109 mmol/L (ref 101–111)
CO2: 24 mmol/L (ref 22–32)
Calcium: 8.6 mg/dL — ABNORMAL LOW (ref 8.9–10.3)
Creatinine, Ser: 0.59 mg/dL (ref 0.44–1.00)
GFR calc non Af Amer: >60 mL/min (ref >60)
GLUCOSE: 80 mg/dL (ref 65–99)
Potassium: 3 mmol/L — ABNORMAL LOW (ref 3.5–5.1)
Sodium: 139 mmol/L (ref 135–145)

## 2017-02-21 LAB — CBC
HCT: 28.5 % — ABNORMAL LOW (ref 35.0–47.0)
Hemoglobin: 9.7 g/dL — ABNORMAL LOW (ref 12.0–16.0)
MCH: 33.1 pg (ref 26.0–34.0)
MCHC: 33.9 g/dL (ref 32.0–36.0)
MCV: 97.6 fL (ref 80.0–100.0)
Platelets: 327 10*3/uL (ref 150–440)
RBC: 2.92 MIL/uL — ABNORMAL LOW (ref 3.80–5.20)
RDW: 14.5 % (ref 11.5–14.5)
WBC: 7 10*3/uL (ref 3.6–11.0)

## 2017-02-21 LAB — PHOSPHORUS: Phosphorus: 4.2 mg/dL (ref 2.5–4.6)

## 2017-02-21 LAB — MAGNESIUM: Magnesium: 1.7 mg/dL (ref 1.7–2.4)

## 2017-02-21 MED ORDER — LEVOTHYROXINE SODIUM 100 MCG PO TABS: 100.0000 ug | ORAL_TABLET | Freq: Every day | ORAL | 1 refills | Status: DC

## 2017-02-21 MED ORDER — POTASSIUM CHLORIDE CRYS ER 20 MEQ PO TBCR
40.0000 meq | EXTENDED_RELEASE_TABLET | Freq: Once | ORAL | Status: AC
  Administered 2017-02-21: 06:00:00 40 meq via ORAL
  Filled 2017-02-21: qty 2

## 2017-02-21 MED ORDER — MAGNESIUM SULFATE 2 GM/50ML IV SOLN
2.0000 g | Freq: Once | INTRAVENOUS | Status: AC
  Administered 2017-02-21: 13:00:00 2 g via INTRAVENOUS
  Filled 2017-02-21: qty 50

## 2017-02-21 MED ORDER — VITAMIN B-1 100 MG PO TABS: 100.0000 mg | ORAL_TABLET | Freq: Every day | ORAL | 1 refills | Status: AC

## 2017-02-21 MED ORDER — TRAZODONE HCL 50 MG PO TABS: 50.0000 mg | ORAL_TABLET | Freq: Every evening | ORAL | 1 refills | Status: DC | PRN

## 2017-02-21 NOTE — Progress Notes (Signed)
MEDICATION RELATED CONSULT NOTE - INITIAL   Pharmacy Consult for electrolyte management Indication: hypokalemia  Allergies  Allergen Reactions  . No Known Allergies     Patient Measurements: Height: '5\' 4"'$  (162.6 cm) Weight: 122 lb 11.2 oz (55.7 kg) (bed scale) IBW/kg (Calculated) : 54.7 Adjusted Body Weight:   Vital Signs: Temp: 98.2 F (36.8 C) (03/03 0426) Temp Source: Oral (03/03 0426) BP: 113/74 (03/03 0426) Pulse Rate: 86 (03/03 0426) Intake/Output from previous day: No intake/output data recorded. Intake/Output from this shift: No intake/output data recorded.  Labs:  Recent Labs  02/19/17 0500 02/20/17 0449 02/21/17 0432  WBC  --   --  7.0  HGB  --   --  9.7*  HCT  --   --  28.5*  PLT  --   --  327  CREATININE 0.76 0.64 0.59  MG 1.2* 1.5* 1.7  PHOS 4.1 3.6 4.2   Estimated Creatinine Clearance: 69.4 mL/min (by C-G formula based on SCr of 0.59 mg/dL).   Microbiology: No results found for this or any previous visit (from the past 720 hour(s)).  Medical History: Past Medical History:  Diagnosis Date  . COPD (chronic obstructive pulmonary disease) ([place])   . Hypertension   . Lung cancer ([place])   . Thyroid disease     Medications:  Scheduled:  . enoxaparin (LOVENOX) injection  40 mg Subcutaneous Q24H  . feeding supplement  1 Container Oral BID BM  . feeding supplement (ENSURE ENLIVE)  237 mL Oral TID WC  . folic acid  1 mg Oral Daily  . levothyroxine  100 mcg Oral QAC breakfast  . mouth rinse  15 mL Mouth Rinse BID  . megestrol  400 mg Oral BID  . metoprolol tartrate  50 mg Oral BID  . naLOXone (NARCAN)  injection  0.4 mg Intravenous Once  . nystatin  5 mL Oral QID  . pantoprazole sodium  40 mg Oral Daily  . potassium chloride  40 mEq Oral Once  . senna  1 tablet Oral Daily  . tiotropium  18 mcg Inhalation Daily    Assessment: Patient admitted for hypothyroidism and AMS found to be hypokalemic of K+ 3.1 And hypomagnesemic w/ Mg 1.1 - 1.2 --  was replaced and now Mg is at goal.  Goal of Therapy:  K+ 3.5 - 5.0 Mg 1.8 - 2.2  Plan:  K 3.7, Ca 8.9, magnesium and phosphorus not assessed. No further supplement warranted at this time. Will reassess tomorrow with AM labs.   3/1 0500 K 3.3, Ca 8.9, last albumin 2.9, adjusted Ca 9.8, phos 4.1, Mg 1.2. Give potassium chloride 20 mEq po x 1, magnesium sulfate 4 mg IV x 1, and recheck electrolytes tomorrow with AM labs.   3/2 0449 K 3.5, Ca 8.9, last albumin 2.9, adjusted Ca 9.8, phos 3.6, Mg 1.5. Give potassium chloride 20 mEq po x 1, magnesium sulfate 2 gm IV x 1, and recheck electrolytes tomorrow with AM labs.   3/3 0432 K 3, Ca 8.6, last albumin 2.9, adjusted Ca 9.5, phos 4.2, Mg 1.7. Hospitalist ordered potassium chloride 40 mEq po x 1. Will recheck BMP this evening and all electrolytes tomorrow with AM labs.    Thank you for this consult.  Eula Jaster A. Jordan Hawks, PharmD, BCPS Clinical Pharmacist 02/21/2017

## 2017-02-21 NOTE — Progress Notes (Signed)
Received MD order to discharge patient to home, reviewed home meds, prescriptions and follow up appointments with patient and family and they verbalized understanding, discharged to home in wheelchair with boyfriend

## 2017-02-21 NOTE — Progress Notes (Addendum)
Entered in error

## 2017-02-21 NOTE — Progress Notes (Signed)
Dr. Estanislado Pandy paged re: K 3.0

## 2017-02-21 NOTE — Plan of Care (Signed)
Problem: Education: Goal: Knowledge of [facility] Education information/materials will improve Outcome: Progressing VSS, free of falls during shift.  Ambulated to [facility] x1 assist.  Denies pain, nausea.  Disoriented/aggressive during shift per report from [facility], received PRN IV Haldol 2.'5mg'$  x1.  Family updated via phone.  Call bell within reach, [place].

## 2017-02-21 NOTE — Care Management Note (Signed)
Case Management Note  Patient Details  Name: Audrey Mcgee MRN: [id number] Date of Birth: 15-Oct-1961  Subjective/Objective:           Call to Audrey Mcgee at [facility] to update her that Mrs Geisel may be discharged home today with [facility] PT, RN, SW. Nebulizer machine at bedside. . Call to [facility] at [facility] with request to please check to see if a hospital bed, wheelchair, and [facility] have been delivered to [address] where Mrs Quarry will be residing with her daughter [name] after this hospital discharge.          Action/Plan:   Expected Discharge Date:                  Expected Discharge Plan:     In-House Referral:     Discharge planning Services     Post Acute Care Choice:    Choice offered to:     DME Arranged:    DME Agency:     HH Arranged:    HH Agency:     Status of Service:     If discussed at [employer] of Stay Meetings, dates discussed:    Additional Comments:  Mordecai Tindol A, RN 02/21/2017, 9:12 AM

## 2017-02-21 NOTE — Care Management Note (Signed)
Case Management Note  Patient Details  Name: Audrey Mcgee MRN: [id number] Date of Birth: October 23, 1961  Subjective/Objective:       Spoke with daughter Audrey Mcgee [phone] who reports that [place] called her and stated that the [facility], hospital bed, and wheelchair will be delivered this afternoon between 1pm and 5:30pm. [name] will be followed for HH-RN, PT, SW by [facility]. Family plans to transport [name] home after the DME equipment is delivered to the home today.              Action/Plan:   Expected Discharge Date:                  Expected Discharge Plan:     In-House Referral:     Discharge planning Services     Post Acute Care Choice:    Choice offered to:     DME Arranged:    DME Agency:     HH Arranged:    HH Agency:     Status of Service:     If discussed at [employer] of Stay Meetings, dates discussed:    Additional Comments:  Abdoulie Tierce A, RN 02/21/2017, 11:48 AM

## 2017-02-22 NOTE — Discharge Summary (Signed)
[facility] [place] NAME: Audrey Mcgee    MR#:  [id number]  DATE OF BIRTH:  08-28-1961  DATE OF ADMISSION:  02/06/2017 ADMITTING PHYSICIAN: Saundra Shelling, MD  DATE OF DISCHARGE: 02/21/2017  3:00 PM  PRIMARY CARE PHYSICIAN: AYCOCK, NGWE A, MD    ADMISSION DIAGNOSIS:  Dehydration [W29.9] Metabolic acidosis [B71.6] Weakness [R53.1]  DISCHARGE DIAGNOSIS:  Principal Problem:   Subacute delirium Active Problems:   Dehydration   Depressive disorder   Hypothyroidism   Malignancy ([place])   Palliative care by specialist   Goals of care, counseling/discussion   DNR (do not resuscitate) discussion   Altered mental status   Cannabis abuse   Nausea   Failure to thrive in adult   Hypokalemia   Generalized weakness   Lung cancer ([place])   SECONDARY DIAGNOSIS:   Past Medical History:  Diagnosis Date  . COPD (chronic obstructive pulmonary disease) ([place])   . Hypertension   . Lung cancer ([employer])   . Thyroid disease     HOSPITAL COURSE:   56 year old female with past medical history of lung cancer, hypertension, hypothyroidism, COPD who presented to the hospital due to generalized weakness, poor by mouth intake and noted to have dysphagia and also noted to be severely hypothyroid.  1. Altered mental status- this was metabolic encephalopathy secondary to severe hypothyroidism, Vitamin B-1 deficiency. -Patient was initiated on thyroid replacement, also given thiamine supplementation. Her mental status continued to wax and wane but has significantly improved since admission. Psychiatry was consulted to see if she had underlying psychiatric illness but did not recommend any further medications. -Patient did receive as needed Haldol for agitation while in the hospital. She has clinically improved and now being discharged on oral thyroid supplements and thiamine supplements and follow up with a primary care physician as an outpatient.   2. Severe  Hypothyroidism-patient was noted to have severe hypothyroidism with TSHlevels greater than 70. Her free T4 and T3 were also low. -Patient was initiated on thyroid replacement therapy and is currently being discharged in 100 g of Synthroid and follow-up with her primary care physician as an outpatient. She should likely have her thyroid function test checked within the next few weeks.  3. Intermittent N/V - patient had some intermittent nausea vomiting while in the hospital which was treated supportively with antiemetics, PPI. Initially when she was significantly obtunded there was a concern for placing a PEG tube or even considering NG tube feedings, but her mental status significantly improved and she's not taking by mouth well without any further nausea vomiting and therefore being discharged home  4. Adult Failure to thrive - she will. cont. Megace, supplements.  - he appetite improved through the hospital course.    5. Stage IV Lung Cancer - pt. had MRI Brain (-) for metastatic disease.  - she has had No treatment since Nov'17. She will Cont. Follow up with [facility] outpatient (Dr. Mike Gip).   6. Generalized weakness - multifactorial and related to #2 &#5.  - seen by PT and they recommend SNF but pt. And family do not want SNF.  - She was discharged home with home health services, she was arranged for a hospital bed, wheelchair and a bedside commode.   7. Thiamine Deficiency - patient was given high-dose IV thiamine while in the hospital for a total of 7 days. She was seen by neurology who recommended this. -She is now being discharged on oral thiamine supplements.  8. Hypokalemia/Hypomagnesemia -  improved and resolved w/ supplementation while in the hospital.   DISCHARGE CONDITIONS:   Stable.   CONSULTS OBTAINED:  Treatment Team:  San Jetty, MD Lenward Chancellor, MD Lloyd Huger, MD Alexis Goodell, MD Catarina Hartshorn, MD Rohini Raeanne Gathers, MD Gonzella Lex, MD  DRUG ALLERGIES:   Allergies  Allergen Reactions  . No Known Allergies     DISCHARGE MEDICATIONS:   Allergies as of 02/21/2017      Reactions   No Known Allergies       Medication List    STOP taking these medications   methimazole 5 MG tablet Commonly known as:  TAPAZOLE   morphine 15 MG 12 hr tablet Commonly known as:  MS CONTIN   predniSONE 20 MG tablet Commonly known as:  DELTASONE     TAKE these medications   dexamethasone 4 MG tablet Commonly known as:  DECADRON Take '4mg'$  BID the day before and day after chemotherapy. What changed:  how much to take  how to take this  when to take this  additional instructions   docusate sodium 100 MG capsule Commonly known as:  COLACE Take 100 mg by mouth 2 (two) times daily as needed for mild constipation.   feeding supplement Liqd Take 1 Container by mouth 2 (two) times daily between meals.   folic acid 242 MCG tablet Commonly known as:  FOLVITE Take 800 mcg by mouth daily.   ipratropium-albuterol 0.5-2.5 (3) MG/3ML Soln Commonly known as:  DUONEB Take 3 mLs by nebulization every 4 (four) hours.   levothyroxine 100 MCG tablet Commonly known as:  SYNTHROID, LEVOTHROID Take 1 tablet (100 mcg total) by mouth daily before breakfast.   lidocaine 2 % solution Commonly known as:  XYLOCAINE Use as directed 10 mLs in the mouth or throat every 4 (four) hours as needed for mouth pain.   losartan 100 MG tablet Commonly known as:  COZAAR Take 1 tablet (100 mg total) by mouth daily.   magnesium oxide 400 MG tablet Commonly known as:  MAG-OX Take 1 tablet (400 mg total) by mouth daily.   megestrol 40 MG/ML suspension Commonly known as:  MEGACE Take 10 mLs (400 mg total) by mouth 2 (two) times daily.   metoprolol 50 MG tablet Commonly known as:  LOPRESSOR take 1 tablet by mouth twice a day NOTE DOSE INCREASE   omeprazole 20 MG capsule Commonly known as:  PRILOSEC Take 1 capsule (20 mg total) by  mouth daily.   ondansetron 8 MG tablet Commonly known as:  ZOFRAN Take 1 tablet (8 mg total) by mouth 2 (two) times daily as needed for nausea or vomiting. What changed:  Another medication with the same name was added. Make sure you understand how and when to take each.   ondansetron 4 MG tablet Commonly known as:  ZOFRAN Take 1 tablet (4 mg total) by mouth every 6 (six) hours as needed for nausea. What changed:  You were already taking a medication with the same name, and this prescription was added. Make sure you understand how and when to take each.   Oxycodone HCl 10 MG Tabs Take 1 tablet (10 mg total) by mouth every 4 (four) hours as needed (pain).   potassium chloride 10 MEQ tablet Commonly known as:  K-DUR Take 1 tablet (10 mEq total) by mouth 2 (two) times daily. For 3 days   senna 8.6 MG Tabs tablet Commonly known as:  SENOKOT Take 1 tablet (8.6 mg total) by mouth  daily.   thiamine 100 MG tablet Commonly known as:  VITAMIN B-1 Take 1 tablet (100 mg total) by mouth daily.   tiotropium 18 MCG inhalation capsule Commonly known as:  SPIRIVA Place 1 capsule (18 mcg total) into inhaler and inhale daily.   traZODone 50 MG tablet Commonly known as:  DESYREL Take 1 tablet (50 mg total) by mouth at bedtime as needed for sleep.         DISCHARGE INSTRUCTIONS:   DIET:  Regular diet  DISCHARGE CONDITION:  Stable  ACTIVITY:  Activity as tolerated  OXYGEN:  Home Oxygen: No.   Oxygen Delivery: room air  DISCHARGE LOCATION:  Home with Home health PT, RN, Social Work.    If you experience worsening of your admission symptoms, develop shortness of breath, life threatening emergency, suicidal or homicidal thoughts you must seek medical attention immediately by calling 911 or calling your MD immediately  if symptoms less severe.  You Must read complete instructions/literature along with all the possible adverse reactions/side effects for all the Medicines you take  and that have been prescribed to you. Take any new Medicines after you have completely understood and accpet all the possible adverse reactions/side effects.   Please note  You were cared for by a hospitalist during your hospital stay. If you have any questions about your discharge medications or the care you received while you were in the hospital after you are discharged, you can call the unit and asked to speak with the hospitalist on call if the hospitalist that took care of you is not available. Once you are discharged, your primary care physician will handle any further medical issues. Please note that NO REFILLS for any discharge medications will be authorized once you are discharged, as it is imperative that you return to your primary care physician (or establish a relationship with a primary care physician if you do not have one) for your aftercare needs so that they can reassess your need for medications and monitor your lab values.     Today   No acute events overnight.  NO hallucinations this a.m. Taking PO well. Pt's daughter at bedside to take her home.    VITAL SIGNS:  Blood pressure 113/79, pulse 91, temperature 98.3 F (36.8 C), temperature source Oral, resp. rate (!) 22, height '5\' 4"'$  (1.626 m), weight 55.7 kg (122 lb 11.2 oz), last menstrual period 01/30/2008, SpO2 100 %.  I/O:   Intake/Output Summary (Last 24 hours) at 02/22/17 1250 Last data filed at 02/21/17 1500  Gross per 24 hour  Intake               50 ml  Output                0 ml  Net               50 ml    PHYSICAL EXAMINATION:   GENERAL:  56 y.o.-year-old cachectic patient lying in bed in NAD.  EYES: Pupils equal, round, reactive to light and accommodation. No scleral icterus. Extraocular muscles intact.  HEENT: Head atraumatic, normocephalic. Oropharynx and nasopharynx clear.  NECK:  Supple, no jugular venous distention. No thyroid enlargement, no tenderness.  LUNGS: Normal breath sounds bilaterally, no  wheezing, rales, rhonchi. No use of accessory muscles of respiration.  CARDIOVASCULAR: S1, S2 normal. No murmurs, rubs, or gallops.  ABDOMEN: Soft, nontender, nondistended. Bowel sounds present. No organomegaly or mass.  EXTREMITIES: No cyanosis, clubbing or edema b/l.  NEUROLOGIC: Cranial nerves II through XII are intact. No focal Motor or sensory deficits b/l.  Globally weak.  PSYCHIATRIC: The patient is alert and oriented x 2.  SKIN: No obvious rash, lesion, or ulcer.    DATA REVIEW:   CBC  Recent Labs Lab 02/21/17 0432  WBC 7.0  HGB 9.7*  HCT 28.5*  PLT 327    Chemistries   Recent Labs Lab 02/21/17 0432  NA 139  K 3.0*  CL 109  CO2 24  GLUCOSE 80  BUN 12  CREATININE 0.59  CALCIUM 8.6*  MG 1.7    Cardiac Enzymes No results for input(s): TROPONINI in the last 168 hours.   RADIOLOGY:  No results found.    Management plans discussed with the patient, family and they are in agreement.  CODE STATUS:  Code Status History    Date Active Date Inactive Code Status Order ID Comments User Context   02/07/2017 10:26 AM 02/21/2017  8:14 PM Full Code [id number]  Saundra Shelling, MD Inpatient   11/07/2016  6:18 AM 11/11/2016  2:39 PM Full Code [id number]  Harrie Foreman, MD Inpatient   06/21/2016 11:52 AM 06/22/2016  3:07 PM Full Code [id number]  [name], MD Inpatient      TOTAL TIME TAKING CARE OF THIS PATIENT: 40 minutes.    [name] M.D on 02/22/2017 at 12:50 PM  Between 7am to 6pm - Pager - [phone]  After 6pm go to [email or web address] - Proofreader  [facility]  Office  [phone]  CC: Primary care physician; Donnie Coffin, MD

## 2017-03-02 ENCOUNTER — Emergency Department: Payer: Medicaid Other

## 2017-03-02 ENCOUNTER — Inpatient Hospital Stay
Admission: EM | Admit: 2017-03-02 | Discharge: 2017-03-04 | DRG: 644 | Disposition: A | Discharge status: Home Health | Payer: Medicaid Other | Attending: Internal Medicine | Admitting: Internal Medicine

## 2017-03-02 DIAGNOSIS — F17210 Nicotine dependence, cigarettes, uncomplicated: Secondary | ICD-10-CM | POA: Diagnosis present

## 2017-03-02 DIAGNOSIS — E876 Hypokalemia: Secondary | ICD-10-CM | POA: Diagnosis present

## 2017-03-02 DIAGNOSIS — R4182 Altered mental status, unspecified: Secondary | ICD-10-CM | POA: Diagnosis present

## 2017-03-02 DIAGNOSIS — R000 Tachycardia, unspecified: Secondary | ICD-10-CM

## 2017-03-02 DIAGNOSIS — Z6821 Body mass index (BMI) 21.0-21.9, adult: Secondary | ICD-10-CM | POA: Diagnosis not present

## 2017-03-02 DIAGNOSIS — E039 Hypothyroidism, unspecified: Secondary | ICD-10-CM | POA: Diagnosis present

## 2017-03-02 DIAGNOSIS — C3490 Malignant neoplasm of unspecified part of unspecified bronchus or lung: Secondary | ICD-10-CM | POA: Diagnosis present

## 2017-03-02 DIAGNOSIS — Z809 Family history of malignant neoplasm, unspecified: Secondary | ICD-10-CM | POA: Diagnosis not present

## 2017-03-02 DIAGNOSIS — J449 Chronic obstructive pulmonary disease, unspecified: Secondary | ICD-10-CM | POA: Diagnosis present

## 2017-03-02 DIAGNOSIS — R64 Cachexia: Secondary | ICD-10-CM | POA: Diagnosis present

## 2017-03-02 DIAGNOSIS — I10 Essential (primary) hypertension: Secondary | ICD-10-CM | POA: Diagnosis present

## 2017-03-02 DIAGNOSIS — E0590 Thyrotoxicosis, unspecified without thyrotoxic crisis or storm: Secondary | ICD-10-CM | POA: Diagnosis present

## 2017-03-02 LAB — COMPREHENSIVE METABOLIC PANEL
ALT: 15 U/L (ref 14–54)
ANION GAP: 14 (ref 5–15)
AST: 27 U/L (ref 15–41)
Albumin: 3.6 g/dL (ref 3.5–5.0)
Alkaline Phosphatase: 58 U/L (ref 38–126)
BUN: 11 mg/dL (ref 6–20)
CALCIUM: 9.9 mg/dL (ref 8.9–10.3)
CHLORIDE: 100 mmol/L — AB (ref 101–111)
CO2: 27 mmol/L (ref 22–32)
Creatinine, Ser: 0.42 mg/dL — ABNORMAL LOW (ref 0.44–1.00)
GFR calc non Af Amer: >60 mL/min (ref >60)
Glucose, Bld: 293 mg/dL — ABNORMAL HIGH (ref 65–99)
POTASSIUM: 2.4 mmol/L — AB (ref 3.5–5.1)
SODIUM: 141 mmol/L (ref 135–145)
Total Bilirubin: 1.1 mg/dL (ref 0.3–1.2)
Total Protein: 7.8 g/dL (ref 6.5–8.1)

## 2017-03-02 LAB — T4, FREE: FREE T4: 2.29 ng/dL — AB (ref 0.61–1.12)

## 2017-03-02 LAB — CBC WITH DIFFERENTIAL/PLATELET
BASOS PCT: 0 %
Basophils Absolute: 0 10*3/uL (ref 0–0.1)
EOS ABS: 0.1 10*3/uL (ref 0–0.7)
EOS PCT: 1 %
HCT: 33.5 % — ABNORMAL LOW (ref 35.0–47.0)
Hemoglobin: 11.5 g/dL — ABNORMAL LOW (ref 12.0–16.0)
LYMPHS ABS: 1.5 10*3/uL (ref 1.0–3.6)
Lymphocytes Relative: 16 %
MCH: 32.7 pg (ref 26.0–34.0)
MCHC: 34.3 g/dL (ref 32.0–36.0)
MCV: 95.4 fL (ref 80.0–100.0)
Monocytes Absolute: 1.2 10*3/uL — ABNORMAL HIGH (ref 0.2–0.9)
Monocytes Relative: 13 %
Neutro Abs: 6.6 10*3/uL — ABNORMAL HIGH (ref 1.4–6.5)
Neutrophils Relative %: 70 %
PLATELETS: 329 10*3/uL (ref 150–440)
RBC: 3.51 MIL/uL — ABNORMAL LOW (ref 3.80–5.20)
RDW: 14.1 % (ref 11.5–14.5)
WBC: 9.4 10*3/uL (ref 3.6–11.0)

## 2017-03-02 LAB — TSH: TSH: 0.041 u[IU]/mL — ABNORMAL LOW (ref 0.350–4.500)

## 2017-03-02 LAB — GLUCOSE, CAPILLARY: GLUCOSE-CAPILLARY: 299 mg/dL — AB (ref 65–99)

## 2017-03-02 LAB — MAGNESIUM: MAGNESIUM: 1.2 mg/dL — AB (ref 1.7–2.4)

## 2017-03-02 LAB — TROPONIN I: Troponin I: <0.03 ng/mL (ref <0.03)

## 2017-03-02 MED ORDER — ONDANSETRON HCL 4 MG PO TABS: 4.0000 mg | ORAL_TABLET | Freq: Four times a day (QID) | ORAL | Status: DC | PRN

## 2017-03-02 MED ORDER — ACETAMINOPHEN 650 MG RE SUPP
650.0000 mg | Freq: Four times a day (QID) | RECTAL | Status: DC | PRN
Start: 2017-03-02 — End: 2017-03-04

## 2017-03-02 MED ORDER — SODIUM CHLORIDE 0.9 % IV SOLN
30.0000 meq | Freq: Once | INTRAVENOUS | Status: AC
  Administered 2017-03-02: 30 meq via INTRAVENOUS
  Filled 2017-03-02: qty 15

## 2017-03-02 MED ORDER — ONDANSETRON HCL 4 MG/2ML IJ SOLN: 4.0000 mg | Freq: Four times a day (QID) | INTRAMUSCULAR | Status: DC | PRN

## 2017-03-02 MED ORDER — LEVALBUTEROL HCL 0.63 MG/3ML IN NEBU: 0.6300 mg | INHALATION_SOLUTION | Freq: Four times a day (QID) | RESPIRATORY_TRACT | Status: DC | PRN

## 2017-03-02 MED ORDER — SODIUM CHLORIDE 0.9% FLUSH
3.0000 mL | Freq: Two times a day (BID) | INTRAVENOUS | Status: DC
  Administered 2017-03-04: 3 mL via INTRAVENOUS

## 2017-03-02 MED ORDER — ENOXAPARIN SODIUM 40 MG/0.4ML ~~LOC~~ SOLN: 40.0000 mg | SUBCUTANEOUS | Status: DC

## 2017-03-02 MED ORDER — PANTOPRAZOLE SODIUM 40 MG PO TBEC
40.0000 mg | DELAYED_RELEASE_TABLET | Freq: Every day | ORAL | Status: DC
  Administered 2017-03-03: 40 mg via ORAL
  Filled 2017-03-02 (×2): qty 1

## 2017-03-02 MED ORDER — TRAZODONE HCL 50 MG PO TABS
50.0000 mg | ORAL_TABLET | Freq: Every evening | ORAL | Status: DC | PRN
  Filled 2017-03-02: qty 1

## 2017-03-02 MED ORDER — MAGNESIUM SULFATE 2 GM/50ML IV SOLN
2.0000 g | Freq: Once | INTRAVENOUS | Status: AC
  Administered 2017-03-03: 2 g via INTRAVENOUS
  Filled 2017-03-02: qty 50

## 2017-03-02 MED ORDER — METOPROLOL TARTRATE 50 MG PO TABS
50.0000 mg | ORAL_TABLET | Freq: Two times a day (BID) | ORAL | Status: DC
  Administered 2017-03-02 – 2017-03-03 (×2): 50 mg via ORAL
  Filled 2017-03-02 (×4): qty 1

## 2017-03-02 MED ORDER — ACETAMINOPHEN 325 MG PO TABS
650.0000 mg | ORAL_TABLET | Freq: Four times a day (QID) | ORAL | Status: DC | PRN
  Administered 2017-03-04: 650 mg via ORAL
  Filled 2017-03-02: qty 2

## 2017-03-02 MED ORDER — SODIUM CHLORIDE 0.9 % IV SOLN
INTRAVENOUS | Status: DC
  Administered 2017-03-03: 01:00:00 via INTRAVENOUS

## 2017-03-02 MED ORDER — TIOTROPIUM BROMIDE MONOHYDRATE 18 MCG IN CAPS
18.0000 ug | ORAL_CAPSULE | Freq: Every day | RESPIRATORY_TRACT | Status: DC
  Filled 2017-03-02: qty 5

## 2017-03-02 MED ORDER — MEGESTROL ACETATE 40 MG/ML PO SUSP
400.0000 mg | Freq: Two times a day (BID) | ORAL | Status: DC
  Administered 2017-03-03: 400 mg via ORAL
  Filled 2017-03-02 (×4): qty 10

## 2017-03-02 MED ORDER — VITAMIN B-1 100 MG PO TABS
100.0000 mg | ORAL_TABLET | Freq: Every day | ORAL | Status: DC
  Administered 2017-03-03: 100 mg via ORAL
  Filled 2017-03-02 (×2): qty 1

## 2017-03-02 MED ORDER — SENNA 8.6 MG PO TABS: 1.0000 | ORAL_TABLET | Freq: Every day | ORAL | Status: DC | PRN

## 2017-03-02 NOTE — ED Notes (Signed)
Pt drank cup of water

## 2017-03-02 NOTE — ED Provider Notes (Signed)
[facility] Emergency Department Provider Note  ____________________________________________   First MD Initiated Contact with Patient 03/02/17 1952     (approximate)  I have reviewed the triage vital signs and the nursing notes.   HISTORY  Chief Complaint Altered Mental Status   HPI Audrey Mcgee is a 56 y.o. female with a history of hypothyroidism as well as hypokalemia who is presenting to the emergency department with shortness of breath. Her family called the ambulance because of shortness of breath as well as anxiety over the past several days. She was found to be tachycardic in route. She is complaining of some mild upper back pain. However, she denies any chest pain at this time is denying any shortness of breath. She says that she thinks she was poisoned by her family and missing that she is nauseous at this time. Said that she ate chicken prior to coming to the emergency department and vomited 1 as well. Denies any abdominal pain.   Past Medical History:  Diagnosis Date  . COPD (chronic obstructive pulmonary disease) ([place])   . Hypertension   . Lung cancer ([place])   . Thyroid disease     Patient Active Problem List   Diagnosis Date Noted  . Subacute delirium 02/18/2017  . Altered mental status 02/12/2017  . Cannabis abuse 02/12/2017  . Nausea 02/12/2017  . Failure to thrive in adult 02/12/2017  . Hypokalemia 02/12/2017  . Generalized weakness 02/12/2017  . Lung cancer ([place]) 02/12/2017  . Hypothyroidism 02/11/2017  . Malignancy ([place])   . Palliative care by specialist   . Goals of care, counseling/discussion   . DNR (do not resuscitate) discussion   . Depressive disorder   . Dehydration 02/07/2017  . Weight loss 02/07/2017  . Encounter for antineoplastic chemotherapy 02/01/2017  . 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 Sepsis ([place]) 11/07/2016  . Protein-calorie malnutrition, severe 11/07/2016  . Acute respiratory  distress 06/21/2016  . COPD exacerbation ([place]) 06/21/2016  . Sinus tachycardia 06/21/2016  . Recurrent aspiration bronchitis/pneumonia ([place]) 06/21/2016  . Dysphagia 06/21/2016  . Nausea and vomiting 06/21/2016  . Dental abscess 05/10/2016  . Swallowing difficulty 03/28/2016  . Hypomagnesemia 02/29/2016  . Hoarseness 02/18/2016  . Cancer related pain 07/08/2015  . Adenocarcinoma, lung ([place]) 03/26/2015  . 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 Ear ache 10/03/2014  . Abscess of buccal cavity 10/03/2014  . Lump in neck 10/03/2014  . Laryngeal pain 10/03/2014    Past Surgical History:  Procedure Laterality Date  . CESAREAN SECTION CLASSICAL    . STOMACH SURGERY     Pt reports for a tumor    Prior to Admission medications   Medication Sig Start Date End Date Taking? Authorizing Provider  docusate sodium (COLACE) 100 MG capsule Take 100 mg by mouth 2 (two) times daily as needed for mild constipation.   Yes Historical Provider, MD  folic acid (FOLVITE) 188 MCG tablet Take 800 mcg by mouth daily.    Yes Historical Provider, MD  ipratropium-albuterol (DUONEB) 0.5-2.5 (3) MG/3ML SOLN Take 3 mLs by nebulization every 4 (four) hours. 02/12/17  Yes [name], MD  levothyroxine (SYNTHROID, LEVOTHROID) 100 MCG tablet Take 1 tablet (100 mcg total) by mouth daily before breakfast. 02/22/17  Yes [name], MD  lidocaine (XYLOCAINE) 2 % solution Use as directed 10 mLs in the mouth or throat every 4 (four) hours as needed for mouth pain.   Yes Historical Provider, MD  megestrol (  MEGACE) 40 MG/ML suspension Take 10 mLs (400 mg total) by mouth 2 (two) times daily. 02/02/17  Yes Lequita Asal, MD  metoprolol (LOPRESSOR) 50 MG tablet take 1 tablet by mouth twice a day NOTE DOSE INCREASE 08/07/16  Yes Historical Provider, MD  omeprazole (PRILOSEC) 20 MG capsule Take 1 capsule (20 mg total) by mouth daily. 06/20/16  Yes Lequita Asal, MD  ondansetron (ZOFRAN) 4 MG tablet  Take 1 tablet (4 mg total) by mouth every 6 (six) hours as needed for nausea. 02/12/17  Yes [name], MD  RA VITAMIN B-1 100 MG TABS Take 100 mg by mouth daily. 02/23/17  Yes Historical Provider, MD  senna (SENOKOT) 8.6 MG TABS tablet Take 1 tablet (8.6 mg total) by mouth daily. 02/13/17  Yes [name], MD  thiamine (VITAMIN B-1) 100 MG tablet Take 1 tablet (100 mg total) by mouth daily. 02/21/17  Yes [name], MD  tiotropium (SPIRIVA) 18 MCG inhalation capsule Place 1 capsule (18 mcg total) into inhaler and inhale daily. 06/22/16  Yes [name], MD  traZODone (DESYREL) 50 MG tablet Take 1 tablet (50 mg total) by mouth at bedtime as needed for sleep. 02/21/17  Yes [name], MD  dexamethasone (DECADRON) 4 MG tablet Take '4mg'$  BID the day before and day after chemotherapy. Patient not taking: Reported on 03/02/2017 10/31/16   Lequita Asal, MD  feeding supplement (BOOST / RESOURCE BREEZE) LIQD Take 1 Container by mouth 2 (two) times daily between meals. 02/12/17   [name], MD  losartan (COZAAR) 100 MG tablet Take 1 tablet (100 mg total) by mouth daily. Patient not taking: Reported on 03/02/2017 06/22/16   [name], MD  magnesium oxide (MAG-OX) 400 MG tablet Take 1 tablet (400 mg total) by mouth daily. Patient not taking: Reported on 03/02/2017 04/17/16   Lequita Asal, MD  ondansetron (ZOFRAN) 8 MG tablet Take 1 tablet (8 mg total) by mouth 2 (two) times daily as needed for nausea or vomiting. Patient not taking: Reported on 03/02/2017 08/22/16   Lequita Asal, MD  Oxycodone HCl 10 MG TABS Take 1 tablet (10 mg total) by mouth every 4 (four) hours as needed (pain). Patient not taking: Reported on 03/02/2017 02/02/17   Lequita Asal, MD  potassium chloride (K-DUR) 10 MEQ tablet Take 1 tablet (10 mEq total) by mouth 2 (two) times daily. For 3 days Patient not taking: Reported on 03/02/2017 02/06/17   Lequita Asal, MD    Allergies No known  allergies  Family History  Problem Relation Age of Onset  . Hypertension Father   . Cancer Father   . Diabetes Father   . Cancer Maternal Aunt     Social History Social History  Substance Use Topics  . Smoking status: Current Every Day Smoker    Years: 15.00    Types: Cigarettes  . Smokeless tobacco: Never Used  . Alcohol use 1.2 oz/week    2 Cans of beer per week    Review of Systems Constitutional: No fever/chills Eyes: No visual changes. ENT: No sore throat. Cardiovascular: Denies chest pain. Respiratory: Denies shortness of breath. Gastrointestinal: No abdominal pain.    No diarrhea.  No constipation. Genitourinary: Negative for dysuria. Musculoskeletal: Negative for back pain. Skin: Negative for rash. Neurological: Negative for headaches, focal weakness or numbness.  10-point ROS otherwise negative.  ____________________________________________   PHYSICAL EXAM:  VITAL SIGNS: ED Triage Vitals  Enc Vitals Group  BP 03/02/17 1947 (!) 148/110     Pulse Rate 03/02/17 1945 (!) 159     Resp 03/02/17 1945 15     Temp 03/02/17 1947 97.7 F (36.5 C)     Temp Source 03/02/17 1947 Oral     SpO2 03/02/17 1945 97 %     Weight 03/02/17 1947 108 lb (49 kg)     Height 03/02/17 1947 '5\' 1"'$  (1.549 m)     Head Circumference --      Peak Flow --      Pain Score 03/02/17 1948 0     Pain Loc --      Pain Edu? --      Excl. in [place]? --     Constitutional: Alert and oriented To place, self and birthdate but not to the year. No acute distress. Eyes: Conjunctivae are normal. PERRL. EOMI. Head: Atraumatic. Nose: No congestion/rhinnorhea. Mouth/Throat: Mucous membranes are moist.   Neck: No stridor.  No masses palpated the anterior neck or grossly enlarged thyroid visualized. Cardiovascular: Tachycardic, regular rhythm. Grossly normal heart sounds.   Respiratory: Normal respiratory effort.  No retractions. Lungs CTAB. Gastrointestinal: Soft and nontender. No  distention. Musculoskeletal: No lower extremity tenderness nor edema.  No joint effusions. Neurologic:  Normal speech and language. No gross focal neurologic deficits are appreciated.  Skin:  Skin is warm, dry and intact. No rash noted. Psychiatric: Mood and affect are normal. Speech and behavior are normal.  ____________________________________________   LABS (all labs ordered are listed, but only abnormal results are displayed)  Labs Reviewed  TSH - Abnormal; Notable for the following:       Result Value   TSH 0.041 (*)    All other components within normal limits  CBC WITH DIFFERENTIAL/PLATELET - Abnormal; Notable for the following:    RBC 3.51 (*)    Hemoglobin 11.5 (*)    HCT 33.5 (*)    Neutro Abs 6.6 (*)    Monocytes Absolute 1.2 (*)    All other components within normal limits  COMPREHENSIVE METABOLIC PANEL - Abnormal; Notable for the following:    Potassium 2.4 (*)    Chloride 100 (*)    Glucose, Bld 293 (*)    Creatinine, Ser 0.42 (*)    All other components within normal limits  GLUCOSE, CAPILLARY - Abnormal; Notable for the following:    Glucose-Capillary 299 (*)    All other components within normal limits  MAGNESIUM - Abnormal; Notable for the following:    Magnesium 1.2 (*)    All other components within normal limits  TROPONIN I  URINALYSIS, COMPLETE (UACMP) WITH MICROSCOPIC  URINE DRUG SCREEN, QUALITATIVE ([facility] ONLY)  T4, FREE  CBG MONITORING, ED   ____________________________________________  EKG  ED ECG REPORT I, Doran Stabler, the attending physician, personally viewed and interpreted this ECG.   Date: 03/02/2017  EKG Time: 1946  Rate: 157  Rhythm: sinus tachycardia  Axis: Normal  Intervals:Prolonged QTC to 532 ms.  ST&T Change: No ST segment elevation or depression. No abnormal T-wave inversion.  ____________________________________________  [id number]  DG Chest 1 View (Final result)  Result time 03/02/17 20:14:25  Final result  by Anner Crete, MD (03/02/17 20:14:25)           Narrative:   CLINICAL DATA: 56 year old female with shortness of breath. History of COPD, and lung cancer.  EXAM: CHEST 1 VIEW  COMPARISON: Chest radiograph dated 02/06/2017 and CT dated 07/08/2016  FINDINGS: There is stable mild elevation  of the left hemidiaphragm. The lungs are clear. There is no pleural effusion or pneumothorax. The cardiac silhouette is within normal limits. No acute osseous pathology identified.  IMPRESSION: No active disease. No interval change.   Electronically Signed By: Anner Crete M.D. On: 03/02/2017 20:14          ____________________________________________   PROCEDURES  Procedure(s) performed:   Procedures  Critical Care performed:   ____________________________________________   INITIAL IMPRESSION / ASSESSMENT AND PLAN / ED COURSE  Pertinent labs & imaging results that were available during my care of the patient were reviewed by me and considered in my medical decision making (see chart for details).   ----------------------------------------- 9:18 PM on 03/02/2017 -----------------------------------------     Patient with improvement of her heart rate down in the 150s now to the 130s. Found to have hypokalemia as well as hypomagnesemia and now has a low TSH. We will replete her electrolytes. T4 is ordered. Patient will be admitted to the hospital. He spent a plan to the patient and she is understanding and willing to comply. Signed out to Dr. Jannifer Franklin.  ____________________________________________   FINAL CLINICAL IMPRESSION(S) / ED DIAGNOSES  Hypomagnesemia. Hypokalemia. Tachycardia.    NEW MEDICATIONS STARTED DURING THIS VISIT:  New Prescriptions   No medications on file     Note:  This document was prepared using Dragon voice recognition software and may include unintentional dictation errors.    Orbie Pyo, MD 03/02/17  2126

## 2017-03-02 NOTE — H&P (Signed)
[facility] NAME: Audrey Mcgee    MR#:  [id number]  DATE OF BIRTH:  [birth year]  DATE OF ADMISSION:  03/02/2017  PRIMARY CARE PHYSICIAN: Donnie Coffin, MD   REQUESTING/REFERRING PHYSICIAN: Clearnce Hasten, MD  CHIEF COMPLAINT:   Chief Complaint  Patient presents with  . Altered Mental Status    HISTORY OF PRESENT ILLNESS:  Audrey Mcgee  is a 56 y.o. female who presents with A few episodes of emesis, complaint of shortness of breath. Here she was found to be tachycardic.  ED workup was largely within normal limits other than her tachycardia and a significantly low TSH. Patient has recently had difficulty with her thyroid condition, and did have a very elevated TSH until recently. No signs of infection. Patient was hypokalemic and hypomagnesemic as well. Hospitalists were called for admission.  PAST MEDICAL HISTORY:   Past Medical History:  Diagnosis Date  . COPD (chronic obstructive pulmonary disease) ([place])   . Hypertension   . Lung cancer ([place])   . Thyroid disease     PAST SURGICAL HISTORY:   Past Surgical History:  Procedure Laterality Date  . CESAREAN SECTION CLASSICAL    . STOMACH SURGERY     Pt reports for a tumor    SOCIAL HISTORY:   Social History  Substance Use Topics  . Smoking status: Current Every Day Smoker    Years: 15.00    Types: Cigarettes  . Smokeless tobacco: Never Used  . Alcohol use 1.2 oz/week    2 Cans of beer per week    FAMILY HISTORY:   Family History  Problem Relation Age of Onset  . Hypertension Father   . Cancer Father   . Diabetes Father   . Cancer Maternal Aunt     DRUG ALLERGIES:   Allergies  Allergen Reactions  . No Known Allergies     MEDICATIONS AT HOME:   Prior to Admission medications   Medication Sig Start Date End Date Taking? Authorizing Provider  docusate sodium (COLACE) 100 MG capsule Take 100 mg by mouth 2 (two) times daily as needed for mild  constipation.   Yes Historical Provider, MD  folic acid (FOLVITE) 027 MCG tablet Take 800 mcg by mouth daily.    Yes Historical Provider, MD  ipratropium-albuterol (DUONEB) 0.5-2.5 (3) MG/3ML SOLN Take 3 mLs by nebulization every 4 (four) hours. 02/12/17  Yes [name], MD  levothyroxine (SYNTHROID, LEVOTHROID) 100 MCG tablet Take 1 tablet (100 mcg total) by mouth daily before breakfast. 02/22/17  Yes [name], MD  lidocaine (XYLOCAINE) 2 % solution Use as directed 10 mLs in the mouth or throat every 4 (four) hours as needed for mouth pain.   Yes Historical Provider, MD  megestrol (MEGACE) 40 MG/ML suspension Take 10 mLs (400 mg total) by mouth 2 (two) times daily. 02/02/17  Yes Lequita Asal, MD  metoprolol (LOPRESSOR) 50 MG tablet take 1 tablet by mouth twice a day NOTE DOSE INCREASE 08/07/16  Yes Historical Provider, MD  omeprazole (PRILOSEC) 20 MG capsule Take 1 capsule (20 mg total) by mouth daily. 06/20/16  Yes Lequita Asal, MD  ondansetron (ZOFRAN) 4 MG tablet Take 1 tablet (4 mg total) by mouth every 6 (six) hours as needed for nausea. 02/12/17  Yes [name], MD  RA VITAMIN B-1 100 MG TABS Take 100 mg by mouth daily. 02/23/17  Yes Historical Provider, MD  senna (SENOKOT) 8.6 MG TABS tablet Take  1 tablet (8.6 mg total) by mouth daily. 02/13/17  Yes [name], MD  thiamine (VITAMIN B-1) 100 MG tablet Take 1 tablet (100 mg total) by mouth daily. 02/21/17  Yes [name], MD  tiotropium (SPIRIVA) 18 MCG inhalation capsule Place 1 capsule (18 mcg total) into inhaler and inhale daily. 06/22/16  Yes [name], MD  traZODone (DESYREL) 50 MG tablet Take 1 tablet (50 mg total) by mouth at bedtime as needed for sleep. 02/21/17  Yes [name], MD  dexamethasone (DECADRON) 4 MG tablet Take '4mg'$  BID the day before and day after chemotherapy. Patient not taking: Reported on 03/02/2017 10/31/16   Lequita Asal, MD  feeding supplement (BOOST / RESOURCE BREEZE) LIQD Take  1 Container by mouth 2 (two) times daily between meals. 02/12/17   [name], MD  losartan (COZAAR) 100 MG tablet Take 1 tablet (100 mg total) by mouth daily. Patient not taking: Reported on 03/02/2017 06/22/16   [name], MD  magnesium oxide (MAG-OX) 400 MG tablet Take 1 tablet (400 mg total) by mouth daily. Patient not taking: Reported on 03/02/2017 04/17/16   Lequita Asal, MD  ondansetron (ZOFRAN) 8 MG tablet Take 1 tablet (8 mg total) by mouth 2 (two) times daily as needed for nausea or vomiting. Patient not taking: Reported on 03/02/2017 08/22/16   Lequita Asal, MD  Oxycodone HCl 10 MG TABS Take 1 tablet (10 mg total) by mouth every 4 (four) hours as needed (pain). Patient not taking: Reported on 03/02/2017 02/02/17   Lequita Asal, MD  potassium chloride (K-DUR) 10 MEQ tablet Take 1 tablet (10 mEq total) by mouth 2 (two) times daily. For 3 days Patient not taking: Reported on 03/02/2017 02/06/17   Lequita Asal, MD    REVIEW OF SYSTEMS:  Review of Systems  Constitutional: Negative for chills, fever, malaise/fatigue and weight loss.  HENT: Negative for ear pain, hearing loss and tinnitus.   Eyes: Negative for blurred vision, double vision, pain and redness.  Respiratory: Positive for shortness of breath. Negative for cough and hemoptysis.   Cardiovascular: Negative for chest pain, palpitations, orthopnea and leg swelling.  Gastrointestinal: Positive for nausea and vomiting. Negative for abdominal pain, constipation and diarrhea.  Genitourinary: Negative for dysuria, frequency and hematuria.  Musculoskeletal: Negative for back pain, joint pain and neck pain.  Skin:       No acne, rash, or lesions  Neurological: Negative for dizziness, tremors, focal weakness and weakness.  Endo/Heme/Allergies: Negative for polydipsia. Does not bruise/bleed easily.  Psychiatric/Behavioral: Negative for depression. The patient is nervous/anxious. The patient does not have  insomnia.      VITAL SIGNS:   Vitals:   03/02/17 2030 03/02/17 2045 03/02/17 2100 03/02/17 2130  BP: (!) 151/96  136/84 134/68  Pulse: (!) 134 (!) 133 (!) 139 (!) 139  Resp: 19 20 (!) 22 (!) 24  Temp:      TempSrc:      SpO2: 96% 95% 97% 96%  Weight:      Height:       Wt Readings from Last 3 Encounters:  03/02/17 49 kg (108 lb)  02/09/17 55.7 kg (122 lb 11.2 oz)  02/06/17 50.7 kg (111 lb 12.4 oz)    PHYSICAL EXAMINATION:  Physical Exam  Vitals reviewed. Constitutional: She is oriented to person, place, and time. She appears well-developed and well-nourished. No distress.  HENT:  Head: Normocephalic and atraumatic.  Mouth/Throat: Oropharynx is clear and moist.  Eyes:  Conjunctivae and EOM are normal. Pupils are equal, round, and reactive to light. No scleral icterus.  Neck: Normal range of motion. Neck supple. No JVD present. No thyromegaly present.  Cardiovascular: Regular rhythm and intact distal pulses.  Exam reveals no gallop and no friction rub.   No murmur heard. Tachycardic  Respiratory: Effort normal and breath sounds normal. No respiratory distress. She has no wheezes. She has no rales.  GI: Soft. Bowel sounds are normal. She exhibits no distension. There is no tenderness.  Musculoskeletal: Normal range of motion. She exhibits no edema.  No arthritis, no gout  Lymphadenopathy:    She has no cervical adenopathy.  Neurological: She is alert and oriented to person, place, and time. No cranial nerve deficit.  No dysarthria, no aphasia  Skin: Skin is warm and dry. No rash noted. No erythema.  Psychiatric: Her behavior is normal. Judgment and thought content normal.  Anxious    LABORATORY PANEL:   CBC  Recent Labs Lab 03/02/17 1953  WBC 9.4  HGB 11.5*  HCT 33.5*  PLT 329   ------------------------------------------------------------------------------------------------------------------  Chemistries   Recent Labs Lab 03/02/17 1953  NA 141  K 2.4*   CL 100*  CO2 27  GLUCOSE 293*  BUN 11  CREATININE 0.42*  CALCIUM 9.9  MG 1.2*  AST 27  ALT 15  ALKPHOS 58  BILITOT 1.1   ------------------------------------------------------------------------------------------------------------------  Cardiac Enzymes  Recent Labs Lab 03/02/17 1953  TROPONINI <0.03   ------------------------------------------------------------------------------------------------------------------  RADIOLOGY:  Dg Chest 1 View  Result Date: 03/02/2017 CLINICAL DATA:  56 year old female with shortness of breath. History of COPD, and lung cancer. EXAM: CHEST 1 VIEW COMPARISON:  Chest radiograph dated 02/06/2017 and CT dated 07/08/2016 FINDINGS: There is stable mild elevation of the left hemidiaphragm. The lungs are clear. There is no pleural effusion or pneumothorax. The cardiac silhouette is within normal limits. No acute osseous pathology identified. IMPRESSION: No active disease.  No interval change. Electronically Signed   By: Anner Crete M.D.   On: 03/02/2017 20:14    EKG:   Orders placed or performed during the hospital encounter of 03/02/17  . EKG 12-Lead  . EKG 12-Lead    IMPRESSION AND PLAN:  Principal Problem:   Hyperthyroidism - we will give her her home dose of metoprolol now with the help of slowing down her heart rate. Some suspicion that the patient may be suffering from exogenous hyperthyroidism. We have started with initial thyroid labs, T3/free T4. If these along with her TSH coincided with stronger suspicion for exogenous hyperthyroidism, then we will order a thyroglobulin studies with antibody. Active Problems:   Hypomagnesemia - replete and monitor   Hypokalemia - replete and monitor   Adenocarcinoma, lung ([place]) - patient states she is on some daily medication for this, but it is not clearly listed in her home meds and her history is not very straightforward. This can be researched by contacting her family or oncologist during  regular office hours.   COPD (chronic obstructive pulmonary disease) ([place]) - continue home meds  All the records are reviewed and case discussed with ED provider. Management plans discussed with the patient and/or family.  DVT PROPHYLAXIS: SubQ lovenox  GI PROPHYLAXIS: PPI  ADMISSION STATUS: Inpatient  CODE STATUS: Full Code Status History    Date Active Date Inactive Code Status Order ID Comments User Context   02/07/2017 10:26 AM 02/21/2017  8:14 PM Full Code [id number]  Saundra Shelling, MD Inpatient   11/07/2016  6:18 AM  11/11/2016  2:39 PM Full Code [id number]  Harrie Foreman, MD Inpatient   06/21/2016 11:52 AM 06/22/2016  3:07 PM Full Code [id number]  [name], MD Inpatient      TOTAL TIME TAKING CARE OF THIS PATIENT: 45 minutes.    Katelind Pytel [place] 03/02/2017, 9:42 PM  [facility]  Office  [phone]  CC: Primary care physician; Donnie Coffin, MD

## 2017-03-02 NOTE — ED Notes (Signed)
Pt daughter called and left a number to be called if the pt is admitted Roderic Ovens [phone]

## 2017-03-02 NOTE — ED Triage Notes (Signed)
Pt arrived via ems - they picked up for c/o shortness of breath - pt is not presenting with shortness of breath - respirations are even and unlabored and lungs sounds are clear in all fields - pt does have elevated heart rate  - rhythm is sinus tach at rate of 159 - pt states that her family is trying to poison her and kill her because they want her money

## 2017-03-02 NOTE — ED Notes (Addendum)
Pt arrived via ems - they picked up for c/o shortness of breath - pt is not presenting with shortness of breath - respirations are even and unlabored and lungs sounds are clear in all fields - pt does have elevated heart rate  - rhythm is sinus tach at rate of 159 - pt states that her family is trying to poison her and kill her because they want her money - pt is oriented to person and place but not time or circumstances

## 2017-03-02 NOTE — ED Notes (Signed)
Allowed pt to verbalize fears of being poisoned and stress about her family trying to kill her

## 2017-03-02 NOTE — ED Notes (Signed)
Daughter notified that pt was being admitted

## 2017-03-02 NOTE — ED Notes (Signed)
Spoke with admitting doctor and he is ok running the potassium and then the magnesium since the pt is such a hard stick

## 2017-03-03 LAB — BASIC METABOLIC PANEL
Anion gap: 8 (ref 5–15)
Anion gap: 7 (ref 5–15)
BUN: 8 mg/dL (ref 6–20)
BUN: 6 mg/dL (ref 6–20)
CALCIUM: 8.9 mg/dL (ref 8.9–10.3)
CHLORIDE: 108 mmol/L (ref 101–111)
CHLORIDE: 111 mmol/L (ref 101–111)
CO2: 25 mmol/L (ref 22–32)
CO2: 22 mmol/L (ref 22–32)
CREATININE: 0.47 mg/dL (ref 0.44–1.00)
Calcium: 8.5 mg/dL — ABNORMAL LOW (ref 8.9–10.3)
Creatinine, Ser: <0.3 mg/dL — ABNORMAL LOW (ref 0.44–1.00)
GLUCOSE: 102 mg/dL — AB (ref 65–99)
Glucose, Bld: 75 mg/dL (ref 65–99)
POTASSIUM: 2.8 mmol/L — AB (ref 3.5–5.1)
Potassium: 2.5 mmol/L — CL (ref 3.5–5.1)
SODIUM: 141 mmol/L (ref 135–145)
Sodium: 140 mmol/L (ref 135–145)

## 2017-03-03 LAB — MAGNESIUM: MAGNESIUM: 1.9 mg/dL (ref 1.7–2.4)

## 2017-03-03 LAB — CBC
HCT: 27.4 % — ABNORMAL LOW (ref 35.0–47.0)
Hemoglobin: 9.4 g/dL — ABNORMAL LOW (ref 12.0–16.0)
MCH: 32.3 pg (ref 26.0–34.0)
MCHC: 34.3 g/dL (ref 32.0–36.0)
MCV: 94.2 fL (ref 80.0–100.0)
PLATELETS: 276 10*3/uL (ref 150–440)
RBC: 2.91 MIL/uL — ABNORMAL LOW (ref 3.80–5.20)
RDW: 14 % (ref 11.5–14.5)
WBC: 7.1 10*3/uL (ref 3.6–11.0)

## 2017-03-03 MED ORDER — POTASSIUM CHLORIDE CRYS ER 20 MEQ PO TBCR
40.0000 meq | EXTENDED_RELEASE_TABLET | Freq: Once | ORAL | Status: AC
  Administered 2017-03-03: 40 meq via ORAL

## 2017-03-03 MED ORDER — POTASSIUM CHLORIDE CRYS ER 20 MEQ PO TBCR
30.0000 meq | EXTENDED_RELEASE_TABLET | ORAL | Status: AC
  Administered 2017-03-03: 20 meq via ORAL
  Filled 2017-03-03: qty 1

## 2017-03-03 MED ORDER — POTASSIUM CHLORIDE CRYS ER 20 MEQ PO TBCR
60.0000 meq | EXTENDED_RELEASE_TABLET | Freq: Once | ORAL | Status: DC
  Filled 2017-03-03: qty 3

## 2017-03-03 MED ORDER — SODIUM CHLORIDE 0.9 % IV SOLN
30.0000 meq | Freq: Once | INTRAVENOUS | Status: DC
  Filled 2017-03-03: qty 15

## 2017-03-03 MED ORDER — SODIUM CHLORIDE 0.9 % IV SOLN
Freq: Once | INTRAVENOUS | Status: AC
  Administered 2017-03-03: 15:00:00 via INTRAVENOUS

## 2017-03-03 MED ORDER — MAGNESIUM SULFATE 2 GM/50ML IV SOLN
2.0000 g | Freq: Once | INTRAVENOUS | Status: AC
  Administered 2017-03-03: 2 g via INTRAVENOUS
  Filled 2017-03-03: qty 50

## 2017-03-03 MED ORDER — SODIUM CHLORIDE 0.9 % IV SOLN
30.0000 meq | INTRAVENOUS | Status: AC
  Administered 2017-03-03 (×2): 30 meq via INTRAVENOUS
  Filled 2017-03-03 (×2): qty 15

## 2017-03-03 NOTE — Progress Notes (Addendum)
[facility] NAME: Audrey Mcgee    MR#:  [id number]  DATE OF BIRTH:  06/23/1961  SUBJECTIVE:  Came in weakness  And found to have low K and mag  REVIEW OF SYSTEMS:   Review of Systems  Constitutional: Negative for chills, fever and weight loss.  HENT: Negative for ear discharge, ear pain and nosebleeds.   Eyes: Negative for blurred vision, pain and discharge.  Respiratory: Negative for sputum production, shortness of breath, wheezing and stridor.   Cardiovascular: Negative for chest pain, palpitations, orthopnea and PND.  Gastrointestinal: Negative for abdominal pain, diarrhea, nausea and vomiting.  Genitourinary: Negative for frequency and urgency.  Musculoskeletal: Negative for back pain and joint pain.  Neurological: Positive for weakness. Negative for sensory change, speech change and focal weakness.  Psychiatric/Behavioral: Negative for depression and hallucinations. The patient is not nervous/anxious.    Tolerating Diet:yes Tolerating PT:   DRUG ALLERGIES:   Allergies  Allergen Reactions  . No Known Allergies     VITALS:  Blood pressure 99/61, pulse (!) 110, temperature 98.6 F (37 C), temperature source Oral, resp. rate 18, height '5\' 1"'$  (1.549 m), weight 52.5 kg (115 lb 12.8 oz), last menstrual period 01/30/2008, SpO2 98 %.  PHYSICAL EXAMINATION:   Physical Exam  GENERAL:  56 y.o.-year-old patient lying in the bed with no acute distress. Thin, cachecitc EYES: Pupils equal, round, reactive to light and accommodation. No scleral icterus. Extraocular muscles intact.  HEENT: Head atraumatic, normocephalic. Oropharynx and nasopharynx clear. Dry oral mucosa NECK:  Supple, no jugular venous distention. No thyroid enlargement, no tenderness.  LUNGS: Normal breath sounds bilaterally, no wheezing, rales, rhonchi. No use of accessory muscles of respiration.  CARDIOVASCULAR: S1, S2 normal. No murmurs, rubs, or  gallops.  ABDOMEN: Soft, nontender, nondistended. Bowel sounds present. No organomegaly or mass.  EXTREMITIES: No cyanosis, clubbing or edema b/l.    NEUROLOGIC: Cranial nerves II through XII are intact. No focal Motor or sensory deficits b/l.   PSYCHIATRIC:  patient is alert and oriented x 3.  SKIN: No obvious rash, lesion, or ulcer.   LABORATORY PANEL:  CBC  Recent Labs Lab 03/03/17 0723  WBC 7.1  HGB 9.4*  HCT 27.4*  PLT 276    Chemistries   Recent Labs Lab 03/02/17 1953 03/03/17 0723  NA 141 141  K 2.4* 2.5*  CL 100* 108  CO2 27 25  GLUCOSE 293* 75  BUN 11 8  CREATININE 0.42* 0.47  CALCIUM 9.9 8.9  MG 1.2* 1.9  AST 27  --   ALT 15  --   ALKPHOS 58  --   BILITOT 1.1  --    Cardiac Enzymes  Recent Labs Lab 03/02/17 1953  TROPONINI <0.03   RADIOLOGY:  Dg Chest 1 View  Result Date: 03/02/2017 CLINICAL DATA:  56 year old female with shortness of breath. History of COPD, and lung cancer. EXAM: CHEST 1 VIEW COMPARISON:  Chest radiograph dated 02/06/2017 and CT dated 07/08/2016 FINDINGS: There is stable mild elevation of the left hemidiaphragm. The lungs are clear. There is no pleural effusion or pneumothorax. The cardiac silhouette is within normal limits. No acute osseous pathology identified. IMPRESSION: No active disease.  No interval change. Electronically Signed   By: Anner Crete M.D.   On: 03/02/2017 20:14   ASSESSMENT AND PLAN:  Audrey Mcgee  is a 56 y.o. female who presents with A few episodes of emesis, complaint of shortness of breath. Here  she was found to be tachycardic.  ED workup was largely within normal limits other than her tachycardia and a significantly low TSH. Patient has recently had difficulty with her thyroid condition, and did have a very elevated TSH until recently  *  Hypomagnesemia - replete and monitor -poor po intake  *  Hypokalemia - replete and monitor  *  Adenocarcinoma, lung ([place]) - patient states she is on some daily  medication for this, but it is not clearly listed in her home meds and her history is not very straightforward. This can be researched by contacting her family or oncologist during regular office hours.  *  COPD (chronic obstructive pulmonary disease) ([place]) - continue home meds  * Hypothyroidism with TSHin the 150's recently was started on po synthroid TSH today 0.041 -holding it---will need repeat TSH as out pt and then can resume accordingly  * PT to see pt   Case discussed with Care Management/Social Worker. Management plans discussed with the patient, family and they are in agreement.  CODE STATUS: FULL  DVT Prophylaxis: lovenox  TOTAL TIME TAKING CARE OF THIS PATIENT: 30 minutes.  >50% time spent on counselling and coordination of care  POSSIBLE D/C IN 1-2 DAYS, DEPENDING ON CLINICAL CONDITION.  Note: This dictation was prepared with Dragon dictation along with smaller phrase technology. Any transcriptional errors that result from this process are unintentional.  Natale Barba M.D on 03/03/2017 at 3:07 PM  Between 7am to 6pm - Pager - [phone]  After 6pm go to [email or web address] - password [email or web address]  Office  [phone]  CC: Primary care physician; Donnie Coffin, MD

## 2017-03-03 NOTE — Care Management Note (Addendum)
Case Management Note  Patient Details  Name: Audrey Mcgee MRN: [id number] Date of Birth: 1960/12/31  Subjective/Objective:         Presented from home with shortness of breath but this sx was not observed upon arrival to ED.         Recent discharge 3.3.2018 with [facility] SN PT SW.  Has hospital bed, rolling walker and BSC at home.  Living with her daughter.  Advanced lung cancer.  Patient with low potassium and magnesium.  Is stating that she feels her family is trying to poison her. Psych has seen patient on previous admission.  May benefit from psych consult.  she is full code.  Notified [facility] of admission   Action/Plan:   Expected Discharge Date:                  Expected Discharge Plan:  [place]  In-House Referral:     Discharge planning Services  CM Consult  Post Acute Care Choice:    Choice offered to:     DME Arranged:    DME Agency:     HH Arranged:    [place] Agency:  Other - See comment (Currently followed by [facility])  Status of Service:  In process, will continue to follow  If discussed at Long Length of Stay Meetings, dates discussed:    Additional Comments:  Katrina Stack, RN 03/03/2017, 8:38 AM

## 2017-03-03 NOTE — Progress Notes (Signed)
Dr. Posey Pronto notified of critical potassium of 2.5

## 2017-03-03 NOTE — Progress Notes (Signed)
Visit made. Patient is currently receiving [facility] home health services of SN and PT as well as home Palliative services. Admitted to [facility] on 3/12 for evaluation of dyspnea and altered mental status. She was noted to be  have low K and Mag as well TSH. Patient seen earlier this morning, she appeared to be sleeping. No family at bedside. Chart notes reviewed, spoke with staff RN Amy and attending physician Dr Posey Pronto. She is receiving electrolyte replacement therapy. [facility] team updated to hospital admission. Will continue to follow. Patient will need resumption of care orders at discharge. Thank you. [name] RN, BSN, [facility] Liaison [phone]

## 2017-03-03 NOTE — Progress Notes (Addendum)
Patient refusing to take her medications. This RN was able to get her to take 20 mEq potassium PO, however she is refusing to take last 10 mEq PO, and IV potassium. MD aware. No new orders. Will continue to monitor.    Update 0645: Patient still refusing medications. Will continue to monitor.   Iran Sizer M

## 2017-03-03 NOTE — Progress Notes (Addendum)
MEDICATION RELATED CONSULT NOTE - INITIAL   Pharmacy Consult for electrolyte management Indication: hypokalemia  Allergies  Allergen Reactions  . No Known Allergies     Patient Measurements: Height: '5\' 1"'$  (154.9 cm) Weight: 115 lb 12.8 oz (52.5 kg) IBW/kg (Calculated) : 47.8 Adjusted Body Weight:   Vital Signs: Temp: 98.6 F (37 C) (03/13 1105) Temp Source: Oral (03/13 1105) BP: 99/61 (03/13 1105) Pulse Rate: 110 (03/13 1105) Intake/Output from previous day: 03/12 0701 - 03/13 0700 In: 75 [I.V.:75] Out: 200 [Urine:200] Intake/Output from this shift: Total I/O In: 1145.3 [P.O.:240; I.V.:375.3; IV Piggyback:530] Out: -   Labs:  Recent Labs  03/02/17 1953 03/03/17 0723 03/03/17 1756  WBC 9.4 7.1  --   HGB 11.5* 9.4*  --   HCT 33.5* 27.4*  --   PLT 329 276  --   CREATININE 0.42* 0.47 <0.30*  MG 1.2* 1.9  --   ALBUMIN 3.6  --   --   PROT 7.8  --   --   AST 27  --   --   ALT 15  --   --   ALKPHOS 58  --   --   BILITOT 1.1  --   --    CrCl cannot be calculated (This lab value cannot be used to calculate CrCl because it is not a number: <0.30).   Microbiology: No results found for this or any previous visit (from the past 720 hour(s)).  Medical History: Past Medical History:  Diagnosis Date  . COPD (chronic obstructive pulmonary disease) ([place])   . Hypertension   . Lung cancer ([place])   . Thyroid disease     Medications:  Infusions:    Assessment: 56 yof cc AMS found with significantly depressed TSH and elevated T4. Tachycardic in ED. Pharmacy consulted to replete electrolytes.  Goal of Therapy:  Electrolytes WNL  Plan:  K+: 2.8  Will order KCl 30 mEq IV x 1 dose KCl 30 mEq po x 2 doses  Recheck all electrolytes with AM labs.  Yamna Mackel M Durante Violett, Pharm.D., BCPS Clinical Pharmacist 03/03/2017,6:57 PM

## 2017-03-03 NOTE — Progress Notes (Signed)
MEDICATION RELATED CONSULT NOTE - INITIAL   Pharmacy Consult for electrolyte management Indication: hypokalemia  Allergies  Allergen Reactions  . No Known Allergies     Patient Measurements: Height: '5\' 1"'$  (154.9 cm) Weight: 115 lb 12.8 oz (52.5 kg) IBW/kg (Calculated) : 47.8 Adjusted Body Weight:   Vital Signs: Temp: 98.3 F (36.8 C) (03/13 0443) Temp Source: Oral (03/13 0443) BP: 139/82 (03/13 0443) Pulse Rate: 97 (03/13 0443) Intake/Output from previous day: 03/12 0701 - 03/13 0700 In: 75 [I.V.:75] Out: 200 [Urine:200] Intake/Output from this shift: Total I/O In: 240 [P.O.:240] Out: -   Labs:  Recent Labs  03/02/17 1953 03/03/17 0723  WBC 9.4 7.1  HGB 11.5* 9.4*  HCT 33.5* 27.4*  PLT 329 276  CREATININE 0.42* 0.47  MG 1.2* 1.9  ALBUMIN 3.6  --   PROT 7.8  --   AST 27  --   ALT 15  --   ALKPHOS 58  --   BILITOT 1.1  --    Estimated Creatinine Clearance: 60.7 mL/min (by C-G formula based on SCr of 0.47 mg/dL).   Microbiology: No results found for this or any previous visit (from the past 720 hour(s)).  Medical History: Past Medical History:  Diagnosis Date  . COPD (chronic obstructive pulmonary disease) ([place])   . Hypertension   . Lung cancer ([place])   . Thyroid disease     Medications:  Infusions:    Assessment: 56 yof cc AMS found with significantly depressed TSH and elevated T4. Tachycardic in ED. Pharmacy consulted to replete electrolytes.  Goal of Therapy:  Electrolytes WNL  Plan:  KCl 30 mEq IV Q3H x 2 doses KCl 40 mEq po x 1 dose Magnesium sulfate 2 gm IV x 1 Recheck BMP this afternoon Recheck all electrolytes with AM labs.  Laural Benes, Pharm.D., BCPS Clinical Pharmacist 03/03/2017,11:01 AM

## 2017-03-03 NOTE — Progress Notes (Signed)
Patient resting comfortably.  NSR on monitor.  No complaints of pain.   Electrolytes being replaced.  Continue to monitor.

## 2017-03-04 LAB — BASIC METABOLIC PANEL
ANION GAP: 7 (ref 5–15)
BUN: 6 mg/dL (ref 6–20)
CHLORIDE: 112 mmol/L — AB (ref 101–111)
CO2: 24 mmol/L (ref 22–32)
Calcium: 9.1 mg/dL (ref 8.9–10.3)
Creatinine, Ser: 0.47 mg/dL (ref 0.44–1.00)
GFR calc Af Amer: >60 mL/min (ref >60)
GFR calc non Af Amer: >60 mL/min (ref >60)
GLUCOSE: 109 mg/dL — AB (ref 65–99)
POTASSIUM: 3 mmol/L — AB (ref 3.5–5.1)
Sodium: 143 mmol/L (ref 135–145)

## 2017-03-04 LAB — MAGNESIUM: Magnesium: 1.5 mg/dL — ABNORMAL LOW (ref 1.7–2.4)

## 2017-03-04 LAB — T3: T3, Total: 272 ng/dL — ABNORMAL HIGH (ref 71–180)

## 2017-03-04 LAB — PHOSPHORUS: Phosphorus: 3.1 mg/dL (ref 2.5–4.6)

## 2017-03-04 LAB — TSH: TSH: 0.033 u[IU]/mL — AB (ref 0.350–4.500)

## 2017-03-04 MED ORDER — METOPROLOL TARTRATE 25 MG PO TABS: 50.0000 mg | ORAL_TABLET | Freq: Two times a day (BID) | ORAL | 0 refills | Status: DC

## 2017-03-04 MED ORDER — SODIUM CHLORIDE 0.9 % IV SOLN
30.0000 meq | Freq: Once | INTRAVENOUS | Status: DC
  Filled 2017-03-04: qty 15

## 2017-03-04 MED ORDER — MAGNESIUM OXIDE 400 (241.3 MG) MG PO TABS
400.0000 mg | ORAL_TABLET | Freq: Two times a day (BID) | ORAL | Status: DC
  Filled 2017-03-04: qty 1

## 2017-03-04 MED ORDER — POTASSIUM CHLORIDE CRYS ER 20 MEQ PO TBCR
60.0000 meq | EXTENDED_RELEASE_TABLET | Freq: Once | ORAL | Status: AC
  Administered 2017-03-04: 60 meq via ORAL
  Filled 2017-03-04: qty 3

## 2017-03-04 MED ORDER — MAGNESIUM SULFATE 2 GM/50ML IV SOLN
2.0000 g | Freq: Once | INTRAVENOUS | Status: DC
  Filled 2017-03-04: qty 50

## 2017-03-04 MED ORDER — POTASSIUM CHLORIDE ER 20 MEQ PO TBCR: EXTENDED_RELEASE_TABLET | ORAL | 0 refills | Status: AC

## 2017-03-04 MED ORDER — POTASSIUM CHLORIDE 20 MEQ PO PACK
40.0000 meq | PACK | Freq: Once | ORAL | Status: AC
  Administered 2017-03-04: 40 meq via ORAL
  Filled 2017-03-04: qty 2

## 2017-03-04 MED ORDER — MAGNESIUM OXIDE 400 (241.3 MG) MG PO TABS
400.0000 mg | ORAL_TABLET | Freq: Once | ORAL | Status: DC
  Filled 2017-03-04: qty 1

## 2017-03-04 MED ORDER — METOPROLOL TARTRATE 50 MG PO TABS: 25.0000 mg | ORAL_TABLET | Freq: Two times a day (BID) | ORAL | 0 refills | Status: AC

## 2017-03-04 MED ORDER — MAGNESIUM OXIDE 400 MG PO TABS: 400.0000 mg | ORAL_TABLET | Freq: Two times a day (BID) | ORAL | 0 refills | Status: AC

## 2017-03-04 NOTE — Discharge Instructions (Signed)
PCP to check BMP and magnesium PCP to check TSH in 3 weeks to determine whether synthroid can be resumed and what dose

## 2017-03-04 NOTE — Progress Notes (Signed)
Patient wanted to be updated about the medications that we were giving the patient. With permission of patient, this RN informed the daughter of all medications that were scheduled. Daughter wanted to know why we were giving PO and IV potassium at the same time. This RN attempted to educated why, but was cut off by daughter. " no, do not give my mama that much medication, she is a small lady she does not need that much medication. Again, this RN attempted to educate the daughter about the medications. Daughter then remarked " I want my mother to go to [place], what do I have to do to get her to [place]. This RN explained why we could not transfer the patient. At this time, the daughter stated that she would like to speak with the doctor on call. MD Jannifer Franklin was notified. While waiting on the MD to get to the floor, the daughter became irate and demanded that nursing staff remove everything from the patient, because " I am taking my mama home. Staff attempted to explain that the patient could not leave with her due to her current state of mentation. The daughter became increasingly more belligerent. Nursing supervisor attempted to calm daughter without any success. Code 300 called. Security escorted daughter out. Patient was visibly upset, crying stating " Im a good person, ive never hurt anybody. I dont want to die. Per pt, her daughter told her that this RN was trying to "drug her up". This RN gave emotional support to patient. Patient still unwilling to take medication because she believes that we are trying to poison her. Will continue to monitor.

## 2017-03-04 NOTE — Progress Notes (Signed)
Patient very agitated and demanding to go home.  She is getting discharged today.   Family members at bedside.  Attempted to educate patient on importance of taking all prescribed medications.  Given information about potassium supplements as well.  Removed telemetry and PIVs.  Given Rx.  No questions at this time.  Patient able to ambulate to [facility] with assistance.  Daughter feels comfortable taking her home by car.  Patient to be escorted out of hospital via wheelchair by volunteers.

## 2017-03-04 NOTE — Care Management (Signed)
[facility] is aware of discharge and orders to resume home health services

## 2017-03-04 NOTE — Progress Notes (Signed)
Visit made. Patient seen sitting up in the bed, alert. Daughter [name] at bedside. Per report of staff RN Amy and chart note review, patient has had some difficult behavior issues, she was briefly interactive with Probation officer. Plan is for discharge home today. Per [name] patient will dc home via car. Resumption of care orders for PT, SN, SW and Palliative requested. Discharge summary and updated notes faxed to referral. [facility] team alerted to planned discharge today. Thank you. [name] RN, BSN, [facility] Liaison [phone] c

## 2017-03-04 NOTE — Progress Notes (Signed)
PT Cancellation Note  Patient Details Name: Audrey Mcgee MRN: [id number] DOB: 05-18-1961   Cancelled Treatment:    Reason Eval/Treat Not Completed: Other (comment) (Consult received and chart reviewed.  Per physician notes, plan to "hold off on PT", as patient refusing/does not wish to participate with PT services at this time.  Per physician, patient to mobilize with nursing prior to discharge.  Nursing aware.  Will discontinue orders at this time; please re-consult should patient needs or goals of care change (assuming patient is willing/interested in participation) )   Reyes Ivan. Owens Shark, PT, DPT, NCS 03/04/17, 11:34 AM [phone]

## 2017-03-04 NOTE — Progress Notes (Signed)
MEDICATION RELATED CONSULT NOTE - INITIAL   Pharmacy Consult for electrolyte management Indication: hypokalemia      Allergies  Allergen Reactions  . No Known Allergies     Patient Measurements: Height: '5\' 1"'$  (154.9 cm) Weight: 115 lb 12.8 oz (52.5 kg) IBW/kg (Calculated) : 47.8 Adjusted Body Weight:   Vital Signs: Temp: 98.6 F (37 C) (03/13 1105) Temp Source: Oral (03/13 1105) BP: 99/61 (03/13 1105) Pulse Rate: 110 (03/13 1105) Intake/Output from previous day: 03/12 0701 - 03/13 0700 In: 75 [I.V.:75] Out: 200 [Urine:200] Intake/Output from this shift: Total I/O In: 1145.3 [P.O.:240; I.V.:375.3; IV Piggyback:530] Out: -   Labs:  Recent Labs (last 2 labs)    Recent Labs  03/02/17 1953 03/03/17 0723 03/03/17 1756  WBC 9.4 7.1  --   HGB 11.5* 9.4*  --   HCT 33.5* 27.4*  --   PLT 329 276  --   CREATININE 0.42* 0.47 <0.30*  MG 1.2* 1.9  --   ALBUMIN 3.6  --   --   PROT 7.8  --   --   AST 27  --   --   ALT 15  --   --   ALKPHOS 58  --   --   BILITOT 1.1  --   --      CrCl cannot be calculated (This lab value cannot be used to calculate CrCl because it is not a number: <0.30).   Microbiology: No results found for this or any previous visit (from the past 720 hour(s)).  Medical History:     Past Medical History:  Diagnosis Date  . COPD (chronic obstructive pulmonary disease) ([place])   . Hypertension   . Lung cancer ([place])   . Thyroid disease     Medications:  Infusions:    Assessment: 56 yof cc AMS found with significantly depressed TSH and elevated T4. Tachycardic in ED. Pharmacy consulted to replete electrolytes.  Goal of Therapy:  Electrolytes WNL  Plan:  K+: 2.8  Will order KCl 30 mEq IV x 1 dose KCl 30 mEq po x 2 doses  Recheck all electrolytes with AM labs.  3/14 @ 0500: K+ 3.0, Mg 1.5, Phos ?? -- will give KCI 30 mEq IV x 1, Mg 2g IV x 1 and will check all electrolytes 3/14 @ 0930.  Thank you for this  consult.  Tobie Lords, PharmD, BCPS Clinical Pharmacist 03/04/2017

## 2017-03-04 NOTE — Discharge Summary (Signed)
[facility] [place] NAME: Audrey Mcgee    MR#:  [id number]  DATE OF BIRTH:  01/28/1961  DATE OF ADMISSION:  03/02/2017 ADMITTING PHYSICIAN: Lance Coon, MD  DATE OF DISCHARGE: 03/04/17  PRIMARY CARE PHYSICIAN: Donnie Coffin, MD    ADMISSION DIAGNOSIS:  Hypokalemia [E87.6] Hypomagnesemia [E83.42] Tachycardia [R00.0]  DISCHARGE DIAGNOSIS:  Hypokalemia and hypomagnesimia gen weakness Lung cancer Hypothyroidism but Synthroid on hold due to over treated thyroid SECONDARY DIAGNOSIS:   Past Medical History:  Diagnosis Date  . COPD (chronic obstructive pulmonary disease) ([place])   . Hypertension   . Lung cancer ([place])   . Thyroid disease     HOSPITAL COURSE:   CynthiaRichmondis a 56 y.o.femalewho presents with A few episodes of emesis, complaint of shortness of breath. Here she was found to be tachycardic. ED workup was largely within normal limits other than her tachycardia and a significantly low TSH. Patient has recently had difficulty with her thyroid condition, and did have a very elevated TSH until recently  *Hypomagnesemia - replete and monitor -poor po intake -mag ox 400 mg bid  *Hypokalemia - replete and monitor [place] con 20 meq bid  *Adenocarcinoma, lung ([place]) - patient states she is on some daily medication for this, but it is not clearly listed in her home meds and her history is not very straightforward -f/u Dr Mike Gip  *COPD (chronic obstructive pulmonary disease) ([place]) - continue home meds  * Hypothyroidism with TSHin the 150's recently was started on po synthroid TSH today 0.041 -holding it---will need repeat TSH as out pt and then can resume accordingly  * PT to see pt---pt wants to walk with RN Will hold of PT  Spoke with dter tasha--dter agreeable D/c home will PCP and Oncology f/u Discussed pt to eat food with good source of K  CONSULTS OBTAINED:    DRUG ALLERGIES:    Allergies  Allergen Reactions  . No Known Allergies     DISCHARGE MEDICATIONS:   Current Discharge Medication List    CONTINUE these medications which have CHANGED   Details  magnesium oxide (MAG-OX) 400 MG tablet Take 1 tablet (400 mg total) by mouth 2 (two) times daily. Qty: 30 tablet, Refills: 0    metoprolol (LOPRESSOR) 50 MG tablet Take 0.5 tablets (25 mg total) by mouth 2 (two) times daily. Qty: 30 tablet, Refills: 0    potassium chloride 20 MEQ TBCR Take 1 tab twice a day Qty: 60 tablet, Refills: 0      CONTINUE these medications which have NOT CHANGED   Details  docusate sodium (COLACE) 100 MG capsule Take 100 mg by mouth 2 (two) times daily as needed for mild constipation.    folic acid (FOLVITE) 784 MCG tablet Take 800 mcg by mouth daily.     ipratropium-albuterol (DUONEB) 0.5-2.5 (3) MG/3ML SOLN Take 3 mLs by nebulization every 4 (four) hours. Qty: 360 mL, Refills: 5    levothyroxine (SYNTHROID, LEVOTHROID) 100 MCG tablet Take 1 tablet (100 mcg total) by mouth daily before breakfast. Qty: 30 tablet, Refills: 1    lidocaine (XYLOCAINE) 2 % solution Use as directed 10 mLs in the mouth or throat every 4 (four) hours as needed for mouth pain.    megestrol (MEGACE) 40 MG/ML suspension Take 10 mLs (400 mg total) by mouth 2 (two) times daily. Qty: 300 mL, Refills: 1    omeprazole (PRILOSEC) 20 MG capsule Take 1 capsule (20 mg total) by mouth  daily. Qty: 30 capsule, Refills: 3    !! ondansetron (ZOFRAN) 4 MG tablet Take 1 tablet (4 mg total) by mouth every 6 (six) hours as needed for nausea. Qty: 20 tablet, Refills: 0    RA VITAMIN B-1 100 MG TABS Take 100 mg by mouth daily. Refills: 0    senna (SENOKOT) 8.6 MG TABS tablet Take 1 tablet (8.6 mg total) by mouth daily. Qty: 120 each, Refills: 0    thiamine (VITAMIN B-1) 100 MG tablet Take 1 tablet (100 mg total) by mouth daily. Qty: 30 tablet, Refills: 1    tiotropium (SPIRIVA) 18 MCG inhalation capsule Place  1 capsule (18 mcg total) into inhaler and inhale daily. Qty: 30 capsule, Refills: 1    traZODone (DESYREL) 50 MG tablet Take 1 tablet (50 mg total) by mouth at bedtime as needed for sleep. Qty: 30 tablet, Refills: 1    dexamethasone (DECADRON) 4 MG tablet Take '4mg'$  BID the day before and day after chemotherapy. Qty: 20 tablet, Refills: 0    feeding supplement (BOOST / RESOURCE BREEZE) LIQD Take 1 Container by mouth 2 (two) times daily between meals. Qty: 90 Container, Refills: 6    !! ondansetron (ZOFRAN) 8 MG tablet Take 1 tablet (8 mg total) by mouth 2 (two) times daily as needed for nausea or vomiting. Qty: 30 tablet, Refills: 1    Oxycodone HCl 10 MG TABS Take 1 tablet (10 mg total) by mouth every 4 (four) hours as needed (pain). Qty: 30 tablet, Refills: 0   Associated Diagnoses: Malignant neoplasm of lung, unspecified laterality, unspecified part of lung ([place])     !! - Potential duplicate medications found. Please discuss with provider.    STOP taking these medications     losartan (COZAAR) 100 MG tablet         If you experience worsening of your admission symptoms, develop shortness of breath, life threatening emergency, suicidal or homicidal thoughts you must seek medical attention immediately by calling 911 or calling your MD immediately  if symptoms less severe.  You Must read complete instructions/literature along with all the possible adverse reactions/side effects for all the Medicines you take and that have been prescribed to you. Take any new Medicines after you have completely understood and accept all the possible adverse reactions/side effects.   Please note  You were cared for by a hospitalist during your hospital stay. If you have any questions about your discharge medications or the care you received while you were in the hospital after you are discharged, you can call the unit and asked to speak with the hospitalist on call if the hospitalist that took care of  you is not available. Once you are discharged, your primary care physician will handle any further medical issues. Please note that NO REFILLS for any discharge medications will be authorized once you are discharged, as it is imperative that you return to your primary care physician (or establish a relationship with a primary care physician if you do not have one) for your aftercare needs so that they can reassess your need for medications and monitor your lab values. Today   SUBJECTIVE   Doing ok Eating BF  VITAL SIGNS:  Blood pressure 120/69, pulse (!) 107, temperature 98.7 F (37.1 C), temperature source Oral, resp. rate 18, height '5\' 1"'$  (1.549 m), weight 52.5 kg (115 lb 12.8 oz), last menstrual period 01/30/2008, SpO2 99 %.  I/O:   Intake/Output Summary (Last 24 hours) at 03/04/17 0931 Last  data filed at 03/03/17 1517  Gross per 24 hour  Intake          1145.33 ml  Output                0 ml  Net          1145.33 ml    PHYSICAL EXAMINATION:  GENERAL:  56 y.o.-year-old patient lying in the bed with no acute distress. Thin cachectic EYES: Pupils equal, round, reactive to light and accommodation. No scleral icterus. Extraocular muscles intact.  HEENT: Head atraumatic, normocephalic. Oropharynx and nasopharynx clear.  NECK:  Supple, no jugular venous distention. No thyroid enlargement, no tenderness.  LUNGS: Normal breath sounds bilaterally, no wheezing, rales,rhonchi or crepitation. No use of accessory muscles of respiration.  CARDIOVASCULAR: S1, S2 normal. No murmurs, rubs, or gallops.  ABDOMEN: Soft, non-tender, non-distended. Bowel sounds present. No organomegaly or mass.  EXTREMITIES: No pedal edema, cyanosis, or clubbing.  NEUROLOGIC: Cranial nerves II through XII are intact. Muscle strength 5/5 in all extremities. Sensation intact. Gait not checked.  PSYCHIATRIC: The patient is alert and oriented x 3.  SKIN: No obvious rash, lesion, or ulcer.   DATA REVIEW:   CBC    Recent Labs Lab 03/03/17 0723  WBC 7.1  HGB 9.4*  HCT 27.4*  PLT 276    Chemistries   Recent Labs Lab 03/02/17 1953  03/04/17 0502  NA 141  < > 143  K 2.4*  < > 3.0*  CL 100*  < > 112*  CO2 27  < > 24  GLUCOSE 293*  < > 109*  BUN 11  < > 6  CREATININE 0.42*  < > 0.47  CALCIUM 9.9  < > 9.1  MG 1.2*  < > 1.5*  AST 27  --   --   ALT 15  --   --   ALKPHOS 58  --   --   BILITOT 1.1  --   --   < > = values in this interval not displayed.  Microbiology Results   No results found for this or any previous visit (from the past 240 hour(s)).  RADIOLOGY:  Dg Chest 1 View  Result Date: 03/02/2017 CLINICAL DATA:  56 year old female with shortness of breath. History of COPD, and lung cancer. EXAM: CHEST 1 VIEW COMPARISON:  Chest radiograph dated 02/06/2017 and CT dated 07/08/2016 FINDINGS: There is stable mild elevation of the left hemidiaphragm. The lungs are clear. There is no pleural effusion or pneumothorax. The cardiac silhouette is within normal limits. No acute osseous pathology identified. IMPRESSION: No active disease.  No interval change. Electronically Signed   By: Anner Crete M.D.   On: 03/02/2017 20:14     Management plans discussed with the patient, family and they are in agreement.  CODE STATUS:     Code Status Orders        Start     Ordered   03/02/17 2302  Full code  Continuous     03/02/17 2301    Code Status History    Date Active Date Inactive Code Status Order ID Comments User Context   02/07/2017 10:26 AM 02/21/2017  8:14 PM Full Code [id number]  Saundra Shelling, MD Inpatient   11/07/2016  6:18 AM 11/11/2016  2:39 PM Full Code [id number]  Harrie Foreman, MD Inpatient   06/21/2016 11:52 AM 06/22/2016  3:07 PM Full Code [id number]  [name], MD Inpatient      TOTAL TIME TAKING CARE OF  THIS PATIENT: 40 minutes.    Neita Landrigan M.D on 03/04/2017 at 9:31 AM  Between 7am to 6pm - Pager - [phone] After 6pm go to [email or web address] - password [email or web address]  Office  [phone]  CC: Primary care physician; Donnie Coffin, MD

## 2017-03-05 ENCOUNTER — Emergency Department: Payer: Medicaid Other

## 2017-03-05 ENCOUNTER — Emergency Department
Admission: EM | Admit: 2017-03-05 | Discharge: 2017-03-05 | Disposition: A | Discharge status: Home / Self Care | Payer: Medicaid Other | Attending: Emergency Medicine | Admitting: Emergency Medicine

## 2017-03-05 ENCOUNTER — Encounter: Payer: Self-pay | Admitting: Emergency Medicine

## 2017-03-05 DIAGNOSIS — E039 Hypothyroidism, unspecified: Secondary | ICD-10-CM | POA: Insufficient documentation

## 2017-03-05 DIAGNOSIS — S0990XA Unspecified injury of head, initial encounter: Secondary | ICD-10-CM | POA: Diagnosis present

## 2017-03-05 DIAGNOSIS — F17210 Nicotine dependence, cigarettes, uncomplicated: Secondary | ICD-10-CM | POA: Diagnosis not present

## 2017-03-05 DIAGNOSIS — Y939 Activity, unspecified: Secondary | ICD-10-CM | POA: Diagnosis not present

## 2017-03-05 DIAGNOSIS — J189 Pneumonia, unspecified organism: Secondary | ICD-10-CM | POA: Insufficient documentation

## 2017-03-05 DIAGNOSIS — Z79899 Other long term (current) drug therapy: Secondary | ICD-10-CM | POA: Diagnosis not present

## 2017-03-05 DIAGNOSIS — Y929 Unspecified place or not applicable: Secondary | ICD-10-CM | POA: Insufficient documentation

## 2017-03-05 DIAGNOSIS — I10 Essential (primary) hypertension: Secondary | ICD-10-CM | POA: Diagnosis not present

## 2017-03-05 DIAGNOSIS — J449 Chronic obstructive pulmonary disease, unspecified: Secondary | ICD-10-CM | POA: Diagnosis not present

## 2017-03-05 DIAGNOSIS — Y999 Unspecified external cause status: Secondary | ICD-10-CM | POA: Diagnosis not present

## 2017-03-05 DIAGNOSIS — W1809XA Striking against other object with subsequent fall, initial encounter: Secondary | ICD-10-CM | POA: Diagnosis not present

## 2017-03-05 HISTORY — DX: Unspecified dementia, unspecified severity, without behavioral disturbance, psychotic disturbance, mood disturbance, and anxiety: F03.90

## 2017-03-05 LAB — BASIC METABOLIC PANEL
Anion gap: 13 (ref 5–15)
BUN: 5 mg/dL — ABNORMAL LOW (ref 6–20)
CHLORIDE: 104 mmol/L (ref 101–111)
CO2: 20 mmol/L — AB (ref 22–32)
CREATININE: 0.34 mg/dL — AB (ref 0.44–1.00)
Calcium: 10 mg/dL (ref 8.9–10.3)
GFR calc Af Amer: >60 mL/min (ref >60)
GFR calc non Af Amer: >60 mL/min (ref >60)
Glucose, Bld: 72 mg/dL (ref 65–99)
POTASSIUM: 3.6 mmol/L (ref 3.5–5.1)
SODIUM: 137 mmol/L (ref 135–145)

## 2017-03-05 LAB — CBC
HCT: 34.6 % — ABNORMAL LOW (ref 35.0–47.0)
Hemoglobin: 11.8 g/dL — ABNORMAL LOW (ref 12.0–16.0)
MCH: 32 pg (ref 26.0–34.0)
MCHC: 34 g/dL (ref 32.0–36.0)
MCV: 93.9 fL (ref 80.0–100.0)
PLATELETS: 363 10*3/uL (ref 150–440)
RBC: 3.69 MIL/uL — ABNORMAL LOW (ref 3.80–5.20)
RDW: 14.3 % (ref 11.5–14.5)
WBC: 9.7 10*3/uL (ref 3.6–11.0)

## 2017-03-05 LAB — TROPONIN I: Troponin I: <0.03 ng/mL (ref <0.03)

## 2017-03-05 LAB — LACTIC ACID, PLASMA: Lactic Acid, Venous: 0.8 mmol/L (ref 0.5–1.9)

## 2017-03-05 MED ORDER — CEFEPIME-DEXTROSE 2 GM/50ML IV SOLR
2.0000 g | Freq: Once | INTRAVENOUS | Status: AC
  Administered 2017-03-05: 2 g via INTRAVENOUS
  Filled 2017-03-05: qty 50

## 2017-03-05 MED ORDER — LEVOFLOXACIN 500 MG PO TABS: 500.0000 mg | ORAL_TABLET | Freq: Every day | ORAL | 0 refills | Status: AC

## 2017-03-05 MED ORDER — ALPRAZOLAM 0.25 MG PO TABS: 0.2500 mg | ORAL_TABLET | Freq: Three times a day (TID) | ORAL | 0 refills | Status: DC | PRN

## 2017-03-05 MED ORDER — MORPHINE SULFATE (CONCENTRATE) 10 MG /0.5 ML PO SOLN: 10.0000 mg | ORAL | 0 refills | Status: DC | PRN

## 2017-03-05 MED ORDER — VANCOMYCIN HCL IN DEXTROSE 1-5 GM/200ML-% IV SOLN
1000.0000 mg | Freq: Once | INTRAVENOUS | Status: AC
  Administered 2017-03-05: 1000 mg via INTRAVENOUS
  Filled 2017-03-05: qty 200

## 2017-03-05 MED ORDER — SODIUM CHLORIDE 0.9 % IV SOLN
1000.0000 mL | Freq: Once | INTRAVENOUS | Status: AC
  Administered 2017-03-05: 1000 mL via INTRAVENOUS

## 2017-03-05 MED ORDER — CEFEPIME HCL 2 G IJ SOLR: 2.0000 g | Freq: Once | INTRAMUSCULAR | Status: DC

## 2017-03-05 NOTE — ED Provider Notes (Addendum)
[facility] Emergency Department Provider Note   ____________________________________________    I have reviewed the triage vital signs and the nursing notes.   HISTORY  Chief Complaint Loss of Consciousness and Head Injury     HPI Audrey Mcgee is a 56 y.o. female who presents with complaints of syncopal episodes. Patient reports 2 separate syncopal episodes over the last 24 hours. During th the most recent episode she fell backwards and hit her head. She complains of mild posterior head pain. No neck pain. She complains of feeling weak and dizzy when she stands. Patient discharged from the hospital yesterday for similar complaints. Patient does complain of a mild cough. She has stage IV lung cancer   Past Medical History:  Diagnosis Date  . COPD (chronic obstructive pulmonary disease) ([place])   . Dementia   . Hypertension   . Lung cancer ([place])   . Thyroid disease     Patient Active Problem List   Diagnosis Date Noted  . Hyperthyroidism 03/02/2017  . COPD (chronic obstructive pulmonary disease) ([place]) 03/02/2017  . Subacute delirium 02/18/2017  . Altered mental status 02/12/2017  . Cannabis abuse 02/12/2017  . Nausea 02/12/2017  . Failure to thrive in adult 02/12/2017  . Hypokalemia 02/12/2017  . Generalized weakness 02/12/2017  . Lung cancer ([place]) 02/12/2017  . Hypothyroidism 02/11/2017  . Malignancy ([place])   . Palliative care by specialist   . Goals of care, counseling/discussion   . DNR (do not resuscitate) discussion   . Depressive disorder   . Dehydration 02/07/2017  . Weight loss 02/07/2017  . Encounter for antineoplastic chemotherapy 02/01/2017  . 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 Sepsis ([place]) 11/07/2016  . Protein-calorie malnutrition, severe 11/07/2016  . Acute respiratory distress 06/21/2016  . COPD exacerbation ([place]) 06/21/2016  . Sinus tachycardia 06/21/2016  . Recurrent aspiration  bronchitis/pneumonia ([place]) 06/21/2016  . Dysphagia 06/21/2016  . Nausea and vomiting 06/21/2016  . Dental abscess 05/10/2016  . Swallowing difficulty 03/28/2016  . Hypomagnesemia 02/29/2016  . Hoarseness 02/18/2016  . Cancer related pain 07/08/2015  . Adenocarcinoma, lung ([place]) 03/26/2015  . 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 Ear ache 10/03/2014  . Abscess of buccal cavity 10/03/2014  . Lump in neck 10/03/2014  . Laryngeal pain 10/03/2014    Past Surgical History:  Procedure Laterality Date  . CESAREAN SECTION CLASSICAL    . STOMACH SURGERY     Pt reports for a tumor    Prior to Admission medications   Medication Sig Start Date End Date Taking? Authorizing Provider  docusate sodium (COLACE) 100 MG capsule Take 100 mg by mouth 2 (two) times daily as needed for mild constipation.   Yes Historical Provider, MD  folic acid (FOLVITE) 299 MCG tablet Take 800 mcg by mouth daily.    Yes Historical Provider, MD  ipratropium-albuterol (DUONEB) 0.5-2.5 (3) MG/3ML SOLN Take 3 mLs by nebulization every 4 (four) hours. 02/12/17  Yes [name], MD  lidocaine (XYLOCAINE) 2 % solution Use as directed 10 mLs in the mouth or throat every 4 (four) hours as needed for mouth pain.   Yes Historical Provider, MD  magnesium oxide (MAG-OX) 400 MG tablet Take 1 tablet (400 mg total) by mouth 2 (two) times daily. 03/04/17  Yes [name], MD  megestrol (MEGACE) 40 MG/ML suspension Take 10 mLs (400 mg total) by mouth 2 (two) times daily. 02/02/17  Yes Lequita Asal, MD  metoprolol (LOPRESSOR) 50 MG  tablet Take 0.5 tablets (25 mg total) by mouth 2 (two) times daily. 03/04/17  Yes [name], MD  omeprazole (PRILOSEC) 20 MG capsule Take 1 capsule (20 mg total) by mouth daily. 06/20/16  Yes Lequita Asal, MD  ondansetron (ZOFRAN) 4 MG tablet Take 1 tablet (4 mg total) by mouth every 6 (six) hours as needed for nausea. 02/12/17  Yes [name], MD  potassium chloride 20 MEQ TBCR  Take 1 tab twice a day 03/04/17  Yes [name], MD  RA VITAMIN B-1 100 MG TABS Take 100 mg by mouth daily. 02/23/17  Yes Historical Provider, MD  senna (SENOKOT) 8.6 MG TABS tablet Take 1 tablet (8.6 mg total) by mouth daily. 02/13/17  Yes [name], MD  thiamine (VITAMIN B-1) 100 MG tablet Take 1 tablet (100 mg total) by mouth daily. 02/21/17  Yes [name], MD  tiotropium (SPIRIVA) 18 MCG inhalation capsule Place 1 capsule (18 mcg total) into inhaler and inhale daily. 06/22/16  Yes [name], MD  traZODone (DESYREL) 50 MG tablet Take 1 tablet (50 mg total) by mouth at bedtime as needed for sleep. 02/21/17  Yes [name], MD  ALPRAZolam Duanne Moron) 0.25 MG tablet Take 1 tablet (0.25 mg total) by mouth 3 (three) times daily as needed for anxiety. 03/05/17   [name], MD  dexamethasone (DECADRON) 4 MG tablet Take '4mg'$  BID the day before and day after chemotherapy. Patient not taking: Reported on 03/02/2017 10/31/16   Lequita Asal, MD  feeding supplement (BOOST / RESOURCE BREEZE) LIQD Take 1 Container by mouth 2 (two) times daily between meals. 02/12/17   [name], MD  levofloxacin (LEVAQUIN) 500 MG tablet Take 1 tablet (500 mg total) by mouth daily. 03/05/17 03/10/17  [name], MD  Morphine Sulfate (MORPHINE CONCENTRATE) 10 mg / 0.5 ml concentrated solution Take 0.5 mLs (10 mg total) by mouth every 4 (four) hours as needed for severe pain. 03/05/17   [name], MD  ondansetron (ZOFRAN) 8 MG tablet Take 1 tablet (8 mg total) by mouth 2 (two) times daily as needed for nausea or vomiting. Patient not taking: Reported on 03/05/2017 08/22/16   Lequita Asal, MD  Oxycodone HCl 10 MG TABS Take 1 tablet (10 mg total) by mouth every 4 (four) hours as needed (pain). Patient not taking: Reported on 03/02/2017 02/02/17   Lequita Asal, MD     Allergies No known allergies  Family History  Problem Relation Age of Onset  . Hypertension Father   . Cancer Father   .  Diabetes Father   . Cancer Maternal Aunt     Social History Social History  Substance Use Topics  . Smoking status: Current Every Day Smoker    Years: 15.00    Types: Cigarettes  . Smokeless tobacco: Never Used  . Alcohol use 1.2 oz/week    2 Cans of beer per week    Review of Systems  Constitutional: Dizziness Eyes: No visual changes.   Cardiovascular: Denies chest pain. Respiratory: Denies shortness of breath. Gastrointestinal: No abdominal pain.    Genitourinary: Negative for dysuria. Musculoskeletal: Negative for back pain. Skin: Negative for rash. Neurological: Negative for headaches  10-point ROS otherwise negative.  ____________________________________________   PHYSICAL EXAM:  VITAL SIGNS: ED Triage Vitals  Enc Vitals Group     BP 03/05/17 1312 (!) 155/105     Pulse Rate 03/05/17 1312 (!) 133     Resp 03/05/17 1312 18  Temp 03/05/17 1312 99 F (37.2 C)     Temp Source 03/05/17 1312 Oral     SpO2 03/05/17 1312 100 %     Weight 03/05/17 1320 117 lb (53.1 kg)     Height 03/05/17 1320 '5\' 3"'$  (1.6 m)     Head Circumference --      Peak Flow --      Pain Score 03/05/17 1312 8     Pain Loc --      Pain Edu? --      Excl. in [place]? --     Constitutional: Alert and oriented. Chronically ill appearing. Pleasant and interactive Eyes: Conjunctivae are normal.   Nose: No congestion/rhinnorhea. Mouth/Throat: Mucous membranes are dry    Cardiovascular: Tachycardia, regular rhythm. Grossly normal heart sounds.  Good peripheral circulation. Respiratory: Normal respiratory effort.   Lungs CTAB. Gastrointestinal: Soft and nontender. No distention.  No CVA tenderness. Genitourinary: deferred Musculoskeletal: No lower extremity tenderness nor edema.  Warm and well perfused Neurologic:  Normal speech and language. No gross focal neurologic deficits are appreciated. Cranial nerves II-12 are normal Skin:  Skin is warm, dry and intact. No rash noted. Psychiatric: Mood  and affect are normal. Speech and behavior are normal.  ____________________________________________   LABS (all labs ordered are listed, but only abnormal results are displayed)  Labs Reviewed  BASIC METABOLIC PANEL - Abnormal; Notable for the following:       Result Value   CO2 20 (*)    BUN 5 (*)    Creatinine, Ser 0.34 (*)    All other components within normal limits  CBC - Abnormal; Notable for the following:    RBC 3.69 (*)    Hemoglobin 11.8 (*)    HCT 34.6 (*)    All other components within normal limits  CULTURE, BLOOD (ROUTINE X 2)  CULTURE, BLOOD (ROUTINE X 2)  TROPONIN I  LACTIC ACID, PLASMA  URINALYSIS, COMPLETE (UACMP) WITH MICROSCOPIC   ____________________________________________  EKG  ED ECG REPORT I, Lavonia Drafts, the attending physician, personally viewed and interpreted this ECG.  Date: 03/05/2017 EKG Time: 1:19 PM Rate: 129 Rhythm: Sinus tachycardia  QRS Axis: normal Intervals: normal ST/T Wave abnormalities: normal Conduction Disturbances: none   ____________________________________________  RADIOLOGY  Chest x-ray concerning for new opacity/infiltrate ____________________________________________   PROCEDURES  Procedure(s) performed: No    Critical Care performed: No ____________________________________________   INITIAL IMPRESSION / ASSESSMENT AND PLAN / ED COURSE  Pertinent labs & imaging results that were available during my care of the patient were reviewed by me and considered in my medical decision making (see chart for details).  She presents with significant tachycardia, 2 episodes of syncope. Just discharged from the hospital yesterday for unexplained tachycardia, her temperature is mildly elevated today we will check a chest x-ray given IV fluids, obtain CT head given her fall and reevaluate     Chest x-ray is concerning for possible pneumonia and her heart rate is still elevated. I recommended admission and  discussed with  hospitalist Dr.Sainani who knows this patient well. He agrees to admit.  ----------------------------------------- 5:56 PM on 03/05/2017 -----------------------------------------   After evaluating the patient Dr. Verdell Carmine feels they can be discharged with outpatient follow up and medications that he prescribed despite the fact that she is still tachycardic.  Family is comfortable with this plan so we will proceed with discharge  ____________________________________________   FINAL CLINICAL IMPRESSION(S) / ED DIAGNOSES  Final diagnoses:  Healthcare-associated pneumonia  NEW MEDICATIONS STARTED DURING THIS VISIT:  Current Discharge Medication List    START taking these medications   Details  ALPRAZolam (XANAX) 0.25 MG tablet Take 1 tablet (0.25 mg total) by mouth 3 (three) times daily as needed for anxiety. Qty: 30 tablet, Refills: 0    levofloxacin (LEVAQUIN) 500 MG tablet Take 1 tablet (500 mg total) by mouth daily. Qty: 5 tablet, Refills: 0    Morphine Sulfate (MORPHINE CONCENTRATE) 10 mg / 0.5 ml concentrated solution Take 0.5 mLs (10 mg total) by mouth every 4 (four) hours as needed for severe pain. Qty: 30 mL, Refills: 0         Note:  This document was prepared using Dragon voice recognition software and may include unintentional dictation errors.    Lavonia Drafts, MD 03/05/17 1754    Lavonia Drafts, MD 03/05/17 [id number]

## 2017-03-05 NOTE — ED Triage Notes (Addendum)
Patient presents to the ED with feeling short of breath this morning and a syncopal episode approx. 30 min. Prior to arrival.  Patient has nasal congestion.  Patient has friend with her that states that patient was seen at the hospital yesterday and that patient has history of dementia.  Patient is unable to remember why she was at the hospital yesterday.  Patient is oriented to self and place but not time.  Patient had chemotherapy week before last.

## 2017-03-05 NOTE — ED Triage Notes (Signed)
Pt states she was getting ready to take a bath and passed out in the BR hitting the back of her head.. Pt was just discharged yesterday with HTN and tachycardia. Pt states she has stage 4 lung CA and gets treatments once a month at [facility]. Family is with the pt.[name]

## 2017-03-05 NOTE — ED Notes (Signed)
Pt refused to be placed on cardiac monitor.

## 2017-03-05 NOTE — ED Notes (Signed)
Family at bedside. 

## 2017-03-05 NOTE — Consult Note (Signed)
[facility] [place] NAME: Audrey Mcgee    MR#:  [id number]  DATE OF BIRTH:  06/09/1961  DATE OF CONSULT:  03/05/2017  PRIMARY CARE PHYSICIAN: Donnie Coffin, MD   REQUESTING/REFERRING PHYSICIAN: Dr. Lavonia Drafts  CHIEF COMPLAINT:   Chief Complaint  Patient presents with  . Loss of Consciousness  . Head Injury    HISTORY OF PRESENT ILLNESS:  Audrey Mcgee  is a 55 y.o. female with a known history of COPD, lung cancer, severe hypothyroidism, adult failure to thrive, dementia who presents to the hospital from her home after recently being discharged yesterday due to shortness of breath shakiness and tachycardia. Patient was admitted for tachycardia and electrolyte abnormalities just a few days back and discharged on oral metoprolol and magnesium and potassium supplements. She presented to the emergency room and her electrolytes are currently within normal limits, her chest x-ray findings are suggestive of suspected pneumonia and hospitalist services were contacted for admission. Patient was discharged with [facility] and the daughter did call them when patient was having the symptoms but the family got a bit panicked and brought her to the ER for further evaluation. Patient has multiple comorbidities and poor prognosis given her lung cancer that cannot be currently treated. I had extensive discussion with the patient and also the family about her goals of care and she is a DO NOT RESUSCITATE and recommended that he goes to be to keep her comfortable as she is already followed by hospice services. The patient and the family are in agreement with this plan. She is afebrile, has a normal white cell count. After further discussion with the patient and the family's I will be discharging her on some medications for anxiety and comfort and continue hospice follow-up at home.  PAST MEDICAL HISTORY:   Past Medical History:  Diagnosis Date   . COPD (chronic obstructive pulmonary disease) ([place])   . Dementia   . Hypertension   . Lung cancer ([place])   . Thyroid disease     PAST SURGICAL HISTOIRY:   Past Surgical History:  Procedure Laterality Date  . CESAREAN SECTION CLASSICAL    . STOMACH SURGERY     Pt reports for a tumor    SOCIAL HISTORY:   Social History  Substance Use Topics  . Smoking status: Current Every Day Smoker    Years: 15.00    Types: Cigarettes  . Smokeless tobacco: Never Used  . Alcohol use 1.2 oz/week    2 Cans of beer per week    FAMILY HISTORY:   Family History  Problem Relation Age of Onset  . Hypertension Father   . Cancer Father   . Diabetes Father   . Cancer Maternal Aunt     DRUG ALLERGIES:   Allergies  Allergen Reactions  . No Known Allergies     REVIEW OF SYSTEMS:   Review of Systems  Constitutional: Negative for fever and weight loss.  HENT: Negative for congestion, nosebleeds and tinnitus.   Eyes: Negative for blurred vision, double vision and redness.  Respiratory: Negative for cough, hemoptysis and shortness of breath.   Cardiovascular: Negative for chest pain, orthopnea, leg swelling and PND.  Gastrointestinal: Negative for abdominal pain, diarrhea, melena, nausea and vomiting.  Genitourinary: Negative for dysuria, hematuria and urgency.  Musculoskeletal: Negative for falls and joint pain.  Neurological: Positive for weakness. Negative for dizziness, tingling, sensory change, focal weakness, seizures and headaches.  Endo/Heme/Allergies: Negative  for polydipsia. Does not bruise/bleed easily.  Psychiatric/Behavioral: Positive for hallucinations. Negative for depression and memory loss. The patient is not nervous/anxious.        Anxiety     MEDICATIONS AT HOME:   Prior to Admission medications   Medication Sig Start Date End Date Taking? Authorizing Provider  docusate sodium (COLACE) 100 MG capsule Take 100 mg by mouth 2 (two) times daily as needed for mild  constipation.   Yes Historical Provider, MD  folic acid (FOLVITE) 841 MCG tablet Take 800 mcg by mouth daily.    Yes Historical Provider, MD  ipratropium-albuterol (DUONEB) 0.5-2.5 (3) MG/3ML SOLN Take 3 mLs by nebulization every 4 (four) hours. 02/12/17  Yes [name], MD  lidocaine (XYLOCAINE) 2 % solution Use as directed 10 mLs in the mouth or throat every 4 (four) hours as needed for mouth pain.   Yes Historical Provider, MD  magnesium oxide (MAG-OX) 400 MG tablet Take 1 tablet (400 mg total) by mouth 2 (two) times daily. 03/04/17  Yes [name], MD  megestrol (MEGACE) 40 MG/ML suspension Take 10 mLs (400 mg total) by mouth 2 (two) times daily. 02/02/17  Yes Lequita Asal, MD  metoprolol (LOPRESSOR) 50 MG tablet Take 0.5 tablets (25 mg total) by mouth 2 (two) times daily. 03/04/17  Yes [name], MD  omeprazole (PRILOSEC) 20 MG capsule Take 1 capsule (20 mg total) by mouth daily. 06/20/16  Yes Lequita Asal, MD  ondansetron (ZOFRAN) 4 MG tablet Take 1 tablet (4 mg total) by mouth every 6 (six) hours as needed for nausea. 02/12/17  Yes [name], MD  potassium chloride 20 MEQ TBCR Take 1 tab twice a day 03/04/17  Yes [name], MD  RA VITAMIN B-1 100 MG TABS Take 100 mg by mouth daily. 02/23/17  Yes Historical Provider, MD  senna (SENOKOT) 8.6 MG TABS tablet Take 1 tablet (8.6 mg total) by mouth daily. 02/13/17  Yes [name], MD  thiamine (VITAMIN B-1) 100 MG tablet Take 1 tablet (100 mg total) by mouth daily. 02/21/17  Yes [name], MD  tiotropium (SPIRIVA) 18 MCG inhalation capsule Place 1 capsule (18 mcg total) into inhaler and inhale daily. 06/22/16  Yes [name], MD  traZODone (DESYREL) 50 MG tablet Take 1 tablet (50 mg total) by mouth at bedtime as needed for sleep. 02/21/17  Yes [name], MD  ALPRAZolam Duanne Moron) 0.25 MG tablet Take 1 tablet (0.25 mg total) by mouth 3 (three) times daily as needed for anxiety. 03/05/17   [name], MD  dexamethasone  (DECADRON) 4 MG tablet Take '4mg'$  BID the day before and day after chemotherapy. Patient not taking: Reported on 03/02/2017 10/31/16   Lequita Asal, MD  feeding supplement (BOOST / RESOURCE BREEZE) LIQD Take 1 Container by mouth 2 (two) times daily between meals. 02/12/17   [name], MD  levofloxacin (LEVAQUIN) 500 MG tablet Take 1 tablet (500 mg total) by mouth daily. 03/05/17 03/10/17  [name], MD  Morphine Sulfate (MORPHINE CONCENTRATE) 10 mg / 0.5 ml concentrated solution Take 0.5 mLs (10 mg total) by mouth every 4 (four) hours as needed for severe pain. 03/05/17   [name], MD  ondansetron (ZOFRAN) 8 MG tablet Take 1 tablet (8 mg total) by mouth 2 (two) times daily as needed for nausea or vomiting. Patient not taking: Reported on 03/05/2017 08/22/16   Lequita Asal, MD  Oxycodone HCl 10 MG TABS Take 1 tablet (  10 mg total) by mouth every 4 (four) hours as needed (pain). Patient not taking: Reported on 03/02/2017 02/02/17   Lequita Asal, MD      VITAL SIGNS:  Blood pressure (!) 144/103, pulse (!) 128, temperature 99 F (37.2 C), temperature source Oral, resp. rate 18, height '5\' 3"'$  (1.6 m), weight 53.1 kg (117 lb), last menstrual period 01/30/2008, SpO2 99 %.  PHYSICAL EXAMINATION:  GENERAL:  56 y.o.-year-old cachectic patient lying in bed in no acute distress.  EYES: Pupils equal, round, reactive to light and accommodation. No scleral icterus. Extraocular muscles intact.  HEENT: Head atraumatic, normocephalic. Oropharynx and nasopharynx clear.  Dry Oral Mucosa.   NECK:  Supple, no jugular venous distention. No thyroid enlargement, no tenderness.  LUNGS: Normal breath sounds bilaterally, no wheezing, rales, rhonchi . No use of accessory muscles of respiration.  CARDIOVASCULAR: S1, S2, RRR. No murmurs, rubs, gallops, clicks.  ABDOMEN: Soft, nontender, nondistended. Bowel sounds present. No organomegaly or mass.  EXTREMITIES: No pedal edema, cyanosis, or clubbing.   NEUROLOGIC: Cranial nerves II through XII are intact. No focal motor or sensory deficits appreciated bilaterally. PSYCHIATRIC: The patient is alert and oriented x 2.  SKIN: No obvious rash, lesion, or ulcer.   LABORATORY PANEL:   CBC  Recent Labs Lab 03/05/17 1344  WBC 9.7  HGB 11.8*  HCT 34.6*  PLT 363   ------------------------------------------------------------------------------------------------------------------  Chemistries   Recent Labs Lab 03/02/17 1953  03/04/17 0502 03/05/17 1344  NA 141  < > 143 137  K 2.4*  < > 3.0* 3.6  CL 100*  < > 112* 104  CO2 27  < > 24 20*  GLUCOSE 293*  < > 109* 72  BUN 11  < > 6 5*  CREATININE 0.42*  < > 0.47 0.34*  CALCIUM 9.9  < > 9.1 10.0  MG 1.2*  < > 1.5*  --   AST 27  --   --   --   ALT 15  --   --   --   ALKPHOS 58  --   --   --   BILITOT 1.1  --   --   --   < > = values in this interval not displayed. ------------------------------------------------------------------------------------------------------------------  Cardiac Enzymes  Recent Labs Lab 03/05/17 1344  TROPONINI <0.03   ------------------------------------------------------------------------------------------------------------------  RADIOLOGY:  Dg Chest 1 View  Result Date: 03/05/2017 CLINICAL DATA:  Stage IV lung malignancy. Bitten by something in the mid back today with subsequent syncopal episode striking the back of the head. Recently discharged hospital with hypertension and tachycardia. Current smoker. EXAM: CHEST 1 VIEW COMPARISON:  Portable chest x-ray of March 02, 2017 FINDINGS: The lungs are adequately inflated. The left hemidiaphragm is chronically elevated. There is subtle increased density peripherally in the left mid lung. The heart and pulmonary vascularity are normal. There is calcification in the wall of the aortic arch. The mediastinum is normal in width. There is no pleural effusion. The observed bony thorax exhibits no acute  abnormality. IMPRESSION: Hazy increased density peripherally in the left mid thorax. This does not appear to be secondary to overlap of normal structures. No abnormal uptake was noted here on the January 29, 2017 PET CT study. This could reflect a pulmonary contusion or a focus of subsegmental atelectasis or early pneumonia. Malignancy is felt less likely. Followup PA and lateral chest X-ray is recommended in 3-4 weeks following trial of antibiotic therapy to ensure resolution and exclude underlying malignancy. Thoracic aortic  atherosclerosis. Electronically Signed   By: David  Martinique M.D.   On: 03/05/2017 15:41   Ct Head Wo Contrast  Result Date: 03/05/2017 CLINICAL DATA:  Syncope with short-term memory loss. History of lung carcinoma EXAM: CT HEAD WITHOUT CONTRAST TECHNIQUE: Contiguous axial images were obtained from the base of the skull through the vertex without intravenous contrast. COMPARISON:  Head CT February 06, 2017 and brain MRI February 10, 2017 FINDINGS: Brain: Mild diffuse atrophy is stable. There is no intracranial mass, hemorrhage, extra-axial fluid collection, or midline shift. There is mild patchy small vessel disease in the centra semiovale bilaterally. Elsewhere gray-white compartments appear normal. No acute infarct is demonstrable. Vascular: There is no hyperdense vessel. There is calcification in each carotid siphon region. Skull: Bony calvarium appears intact. There are no appreciable blastic or lytic bone lesions. Sinuses/Orbits: Visualized paranasal sinuses are clear. Visualized orbits appear symmetric bilaterally. Other: There is stable opacification and several inferior posterior mastoid air cells. Other visualized mastoid air cells are clear. There is debris in the left external auditory canal, unchanged from prior study. IMPRESSION: Atrophy with mild periventricular small vessel disease. No intracranial mass, hemorrhage, or extra-axial fluid collection. Carotid siphon calcification  noted. Chronic appearing mastoid disease on the left inferiorly. Electronically Signed   By: Lowella Grip III M.D.   On: 03/05/2017 15:30     IMPRESSION AND PLAN:   56 year old female well-known to me from previous hospitalization with history of lung cancer, COPD, severe hypothyroidism, dementia, anxiety who presented to the hospital due to shaking, shortness of breath.  1. Shortness of breath/shaking-secondary to COPD, lung cancer and anxiety. -Patient is currently not hypoxic, her chest x-ray findings this was suggestive of suspected pneumonia but she is afebrile and has a normal white cell count. -I will empirically discharge her on some oral Levaquin.  2. Lung cancer-patient is followed by Dr. Mike Gip. She is now followed by [facility]. -I discharge her on some anxiety meds with Xanax and also some Roxanol for air hunger.  3. Hypothyroidism-patient will continue Synthroid.  4. Anxiety-patient was discharged on some Xanax.  5. COPD-patient has no acute exacerbation and will continue her Spiriva, DuoNeb nebs as needed.  6. Pneumonia- will give Levaquin for 5 days.   I have an extensive discussion with the patient and also the patient's daughter at bedside about overall goals of care and she is only followed by hospice and given her terminal diagnosis of lung cancer which is not treatable presently. There were in agreement that the goal should be comfort rather than hospitalization was only. They're in agreement with this plan and patient is not being discharged on some empiric oral antibiotics, Roxanol for air hunger and also Xanax for anxiety.   All the records are reviewed and case discussed with Consulting provider. Management plans discussed with the patient, family and they are in agreement.  CODE STATUS: DNR  TOTAL TIME TAKING CARE OF THIS PATIENT: 45 minutes.    [name] M.D on 03/05/2017 at 5:53 PM  Between 7am to 6pm - Pager -  [phone]  After 6pm go to [email or web address] - password [email or web address] [facility]  Office  [phone]  CC: Primary care Physician: Donnie Coffin, MD

## 2017-03-05 NOTE — ED Notes (Signed)
E-signature box not working. Pt daughter and caregiver verbalized understanding of discharge instructions and denied questions.

## 2017-03-10 LAB — CULTURE, BLOOD (ROUTINE X 2)
CULTURE: NO GROWTH
Culture: NO GROWTH

## 2017-03-11 ENCOUNTER — Other Ambulatory Visit: Payer: Self-pay | Admitting: *Deleted

## 2017-03-11 NOTE — Telephone Encounter (Signed)
Needs refill of Megace, not eating or drinking. Living at [facility] now. Call Vicky when refilled and she will contact the daughter.

## 2017-03-12 ENCOUNTER — Encounter: Payer: Self-pay | Admitting: Emergency Medicine

## 2017-03-12 ENCOUNTER — Emergency Department: Payer: Medicaid Other

## 2017-03-12 ENCOUNTER — Inpatient Hospital Stay
Admission: EM | Admit: 2017-03-12 | Discharge: 2017-03-18 | DRG: 871 | Disposition: A | Discharge status: Hospice | Payer: Medicaid Other | Attending: Internal Medicine | Admitting: Internal Medicine

## 2017-03-12 DIAGNOSIS — Z515 Encounter for palliative care: Secondary | ICD-10-CM | POA: Diagnosis present

## 2017-03-12 DIAGNOSIS — C3490 Malignant neoplasm of unspecified part of unspecified bronchus or lung: Secondary | ICD-10-CM | POA: Diagnosis present

## 2017-03-12 DIAGNOSIS — J984 Other disorders of lung: Secondary | ICD-10-CM

## 2017-03-12 DIAGNOSIS — E039 Hypothyroidism, unspecified: Secondary | ICD-10-CM | POA: Diagnosis not present

## 2017-03-12 DIAGNOSIS — E876 Hypokalemia: Secondary | ICD-10-CM | POA: Diagnosis present

## 2017-03-12 DIAGNOSIS — E538 Deficiency of other specified B group vitamins: Secondary | ICD-10-CM | POA: Diagnosis not present

## 2017-03-12 DIAGNOSIS — Z9221 Personal history of antineoplastic chemotherapy: Secondary | ICD-10-CM | POA: Diagnosis not present

## 2017-03-12 DIAGNOSIS — R630 Anorexia: Secondary | ICD-10-CM | POA: Diagnosis not present

## 2017-03-12 DIAGNOSIS — K219 Gastro-esophageal reflux disease without esophagitis: Secondary | ICD-10-CM | POA: Diagnosis present

## 2017-03-12 DIAGNOSIS — Y95 Nosocomial condition: Secondary | ICD-10-CM | POA: Diagnosis present

## 2017-03-12 DIAGNOSIS — E43 Unspecified severe protein-calorie malnutrition: Secondary | ICD-10-CM | POA: Diagnosis present

## 2017-03-12 DIAGNOSIS — J189 Pneumonia, unspecified organism: Secondary | ICD-10-CM | POA: Diagnosis not present

## 2017-03-12 DIAGNOSIS — E0590 Thyrotoxicosis, unspecified without thyrotoxic crisis or storm: Secondary | ICD-10-CM | POA: Diagnosis present

## 2017-03-12 DIAGNOSIS — F17210 Nicotine dependence, cigarettes, uncomplicated: Secondary | ICD-10-CM | POA: Diagnosis present

## 2017-03-12 DIAGNOSIS — Z6824 Body mass index (BMI) 24.0-24.9, adult: Secondary | ICD-10-CM | POA: Diagnosis not present

## 2017-03-12 DIAGNOSIS — C3492 Malignant neoplasm of unspecified part of left bronchus or lung: Secondary | ICD-10-CM | POA: Diagnosis not present

## 2017-03-12 DIAGNOSIS — Z79899 Other long term (current) drug therapy: Secondary | ICD-10-CM

## 2017-03-12 DIAGNOSIS — J188 Other pneumonia, unspecified organism: Secondary | ICD-10-CM | POA: Diagnosis present

## 2017-03-12 DIAGNOSIS — I471 Supraventricular tachycardia: Secondary | ICD-10-CM | POA: Diagnosis present

## 2017-03-12 DIAGNOSIS — F0390 Unspecified dementia without behavioral disturbance: Secondary | ICD-10-CM | POA: Diagnosis present

## 2017-03-12 DIAGNOSIS — R599 Enlarged lymph nodes, unspecified: Secondary | ICD-10-CM | POA: Diagnosis not present

## 2017-03-12 DIAGNOSIS — Z79891 Long term (current) use of opiate analgesic: Secondary | ICD-10-CM | POA: Diagnosis not present

## 2017-03-12 DIAGNOSIS — I10 Essential (primary) hypertension: Secondary | ICD-10-CM | POA: Diagnosis not present

## 2017-03-12 DIAGNOSIS — R918 Other nonspecific abnormal finding of lung field: Secondary | ICD-10-CM | POA: Diagnosis not present

## 2017-03-12 DIAGNOSIS — J181 Lobar pneumonia, unspecified organism: Secondary | ICD-10-CM | POA: Diagnosis present

## 2017-03-12 DIAGNOSIS — R531 Weakness: Secondary | ICD-10-CM | POA: Diagnosis not present

## 2017-03-12 DIAGNOSIS — J449 Chronic obstructive pulmonary disease, unspecified: Secondary | ICD-10-CM | POA: Diagnosis not present

## 2017-03-12 DIAGNOSIS — A419 Sepsis, unspecified organism: Principal | ICD-10-CM | POA: Diagnosis present

## 2017-03-12 DIAGNOSIS — E860 Dehydration: Secondary | ICD-10-CM | POA: Diagnosis present

## 2017-03-12 DIAGNOSIS — J440 Chronic obstructive pulmonary disease with acute lower respiratory infection: Secondary | ICD-10-CM | POA: Diagnosis present

## 2017-03-12 LAB — CBC WITH DIFFERENTIAL/PLATELET
BASOS ABS: 0 10*3/uL (ref 0–0.1)
BASOS PCT: 0 %
Eosinophils Absolute: 0.1 10*3/uL (ref 0–0.7)
Eosinophils Relative: 1 %
HCT: 34.4 % — ABNORMAL LOW (ref 35.0–47.0)
Hemoglobin: 11.4 g/dL — ABNORMAL LOW (ref 12.0–16.0)
LYMPHS PCT: 10 %
Lymphs Abs: 1.1 10*3/uL (ref 1.0–3.6)
MCH: 30 pg (ref 26.0–34.0)
MCHC: 33 g/dL (ref 32.0–36.0)
MCV: 90.9 fL (ref 80.0–100.0)
MONO ABS: 1.5 10*3/uL — AB (ref 0.2–0.9)
Monocytes Relative: 13 %
NEUTROS ABS: 9.1 10*3/uL — AB (ref 1.4–6.5)
NEUTROS PCT: 76 %
Platelets: 396 10*3/uL (ref 150–440)
RBC: 3.79 MIL/uL — AB (ref 3.80–5.20)
RDW: 13.9 % (ref 11.5–14.5)
WBC: 11.9 10*3/uL — AB (ref 3.6–11.0)

## 2017-03-12 LAB — BLOOD GAS, VENOUS
Acid-Base Excess: 0 mmol/L (ref 0.0–2.0)
BICARBONATE: 23.6 mmol/L (ref 20.0–28.0)
O2 Saturation: 72.3 %
PATIENT TEMPERATURE: 37
PCO2 VEN: 34 mmHg — AB (ref 44.0–60.0)
PH VEN: 7.45 — AB (ref 7.250–7.430)
PO2 VEN: 36 mmHg (ref 32.0–45.0)

## 2017-03-12 LAB — COMPREHENSIVE METABOLIC PANEL
ALBUMIN: 2.8 g/dL — AB (ref 3.5–5.0)
ALT: 9 U/L — AB (ref 14–54)
AST: 24 U/L (ref 15–41)
Alkaline Phosphatase: 67 U/L (ref 38–126)
Anion gap: 12 (ref 5–15)
BUN: 12 mg/dL (ref 6–20)
CHLORIDE: 97 mmol/L — AB (ref 101–111)
CO2: 26 mmol/L (ref 22–32)
Calcium: 10.2 mg/dL (ref 8.9–10.3)
Creatinine, Ser: 0.63 mg/dL (ref 0.44–1.00)
GFR calc Af Amer: >60 mL/min (ref >60)
Glucose, Bld: 129 mg/dL — ABNORMAL HIGH (ref 65–99)
POTASSIUM: 2.9 mmol/L — AB (ref 3.5–5.1)
Sodium: 135 mmol/L (ref 135–145)
TOTAL PROTEIN: 8.6 g/dL — AB (ref 6.5–8.1)
Total Bilirubin: 1.4 mg/dL — ABNORMAL HIGH (ref 0.3–1.2)

## 2017-03-12 LAB — TROPONIN I

## 2017-03-12 LAB — PROTIME-INR
INR: 1.26
Prothrombin Time: 15.9 seconds — ABNORMAL HIGH (ref 11.4–15.2)

## 2017-03-12 LAB — PROCALCITONIN: PROCALCITONIN: 0.18 ng/mL

## 2017-03-12 LAB — LACTIC ACID, PLASMA: Lactic Acid, Venous: 2.5 mmol/L (ref 0.5–1.9)

## 2017-03-12 MED ORDER — SODIUM CHLORIDE 0.9 % IV BOLUS (SEPSIS)
1000.0000 mL | Freq: Once | INTRAVENOUS | Status: AC
  Administered 2017-03-12: 1000 mL via INTRAVENOUS

## 2017-03-12 MED ORDER — ALPRAZOLAM 0.5 MG PO TABS
0.5000 mg | ORAL_TABLET | Freq: Every evening | ORAL | Status: DC | PRN
  Administered 2017-03-12 – 2017-03-17 (×4): 0.5 mg via ORAL
  Filled 2017-03-12 (×5): qty 1

## 2017-03-12 MED ORDER — POTASSIUM CHLORIDE CRYS ER 20 MEQ PO TBCR
40.0000 meq | EXTENDED_RELEASE_TABLET | Freq: Once | ORAL | Status: AC
  Administered 2017-03-12: 40 meq via ORAL
  Filled 2017-03-12: qty 2

## 2017-03-12 MED ORDER — IOPAMIDOL (ISOVUE-300) INJECTION 61%
75.0000 mL | Freq: Once | INTRAVENOUS | Status: AC | PRN
  Administered 2017-03-12: 75 mL via INTRAVENOUS

## 2017-03-12 MED ORDER — CEFEPIME-DEXTROSE 1 GM/50ML IV SOLR
1.0000 g | Freq: Once | INTRAVENOUS | Status: AC
  Administered 2017-03-12: 1 g via INTRAVENOUS
  Filled 2017-03-12: qty 50

## 2017-03-12 MED ORDER — VANCOMYCIN HCL IN DEXTROSE 1-5 GM/200ML-% IV SOLN
1000.0000 mg | Freq: Once | INTRAVENOUS | Status: AC
  Administered 2017-03-12: 1000 mg via INTRAVENOUS
  Filled 2017-03-12: qty 200
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
## 2017-03-12 NOTE — ED Triage Notes (Signed)
Pt presents to ED 15 via EMS c/o generalized weakness; per EMS pt was displayiing SVT with HR in the 180s-190s range when EMS arrived, they gave '6mg'$  of Adenosine and that brought the HR down to 70s but then went back up to 120s; pt has history of stage IV lung cancer, HTN and diabetes; per EMS pt's cbg was 165 and pt is O2 sats at 100% on 3 L; pt has not allergies. At this time pt is awake, alert and oriented to person and place but not to time.

## 2017-03-12 NOTE — ED Notes (Signed)
Called lab, spoke with Baxter Flattery about how to collect Acid Fast Smear and Acid Fast Culture. [facility] at [facility] and returned called. Baxter Flattery informed this RN to have patient spit sputum into single sterile cup apply one label to cup, and send second label in bag.

## 2017-03-12 NOTE — H&P (Signed)
[facility] [place] NAME: Audrey Mcgee    MR#:  [id number]  DATE OF BIRTH:  1961/06/14  DATE OF ADMISSION:  03/12/2017  PRIMARY CARE PHYSICIAN: Donnie Coffin, MD   REQUESTING/REFERRING PHYSICIAN: Rifenbark, MD  CHIEF COMPLAINT:   Chief Complaint  Patient presents with  . Weakness    HISTORY OF PRESENT ILLNESS:  Audrey Mcgee  is a 56 y.o. female who presents with Fevers and chills at home, weakness, malaise. Here in the ED she was found to meet sepsis criteria and had a lesion on her chest x-ray with some central necrosis. Concern is for an infectious process. Hospitalists were called for admission and further evaluation  PAST MEDICAL HISTORY:   Past Medical History:  Diagnosis Date  . COPD (chronic obstructive pulmonary disease) ([place])   . Dementia   . Hypertension   . Lung cancer ([place])   . Thyroid disease     PAST SURGICAL HISTORY:   Past Surgical History:  Procedure Laterality Date  . CESAREAN SECTION CLASSICAL    . STOMACH SURGERY     Pt reports for a tumor    SOCIAL HISTORY:   Social History  Substance Use Topics  . Smoking status: Current Every Day Smoker    Years: 15.00    Types: Cigarettes  . Smokeless tobacco: Never Used  . Alcohol use 1.2 oz/week    2 Cans of beer per week    FAMILY HISTORY:   Family History  Problem Relation Age of Onset  . Hypertension Father   . Cancer Father   . Diabetes Father   . Cancer Maternal Aunt     DRUG ALLERGIES:   Allergies  Allergen Reactions  . No Known Allergies     MEDICATIONS AT HOME:   Prior to Admission medications   Medication Sig Start Date End Date Taking? Authorizing Provider  ALPRAZolam (XANAX) 0.25 MG tablet Take 1 tablet (0.25 mg total) by mouth 3 (three) times daily as needed for anxiety. 03/05/17  Yes [name], MD  docusate sodium (COLACE) 100 MG capsule Take 100 mg by mouth 2 (two) times daily as needed for mild  constipation.   Yes Historical Provider, MD  feeding supplement (BOOST / RESOURCE BREEZE) LIQD Take 1 Container by mouth 2 (two) times daily between meals. 02/12/17  Yes [name], MD  folic acid (FOLVITE) 983 MCG tablet Take 800 mcg by mouth daily.    Yes Historical Provider, MD  ipratropium-albuterol (DUONEB) 0.5-2.5 (3) MG/3ML SOLN Take 3 mLs by nebulization every 4 (four) hours. 02/12/17  Yes [name], MD  lidocaine (XYLOCAINE) 2 % solution Use as directed 10 mLs in the mouth or throat every 4 (four) hours as needed for mouth pain.   Yes Historical Provider, MD  magnesium oxide (MAG-OX) 400 MG tablet Take 1 tablet (400 mg total) by mouth 2 (two) times daily. 03/04/17  Yes [name], MD  megestrol (MEGACE) 40 MG/ML suspension Take 10 mLs (400 mg total) by mouth 2 (two) times daily. 02/02/17  Yes Lequita Asal, MD  metoprolol (LOPRESSOR) 50 MG tablet Take 0.5 tablets (25 mg total) by mouth 2 (two) times daily. 03/04/17  Yes [name], MD  Morphine Sulfate (MORPHINE CONCENTRATE) 10 mg / 0.5 ml concentrated solution Take 0.5 mLs (10 mg total) by mouth every 4 (four) hours as needed for severe pain. 03/05/17  Yes [name], MD  omeprazole (PRILOSEC) 20 MG capsule Take 1  capsule (20 mg total) by mouth daily. 06/20/16  Yes Lequita Asal, MD  ondansetron (ZOFRAN) 4 MG tablet Take 1 tablet (4 mg total) by mouth every 6 (six) hours as needed for nausea. 02/12/17  Yes [name], MD  potassium chloride 20 MEQ TBCR Take 1 tab twice a day 03/04/17  Yes [name], MD  RA VITAMIN B-1 100 MG TABS Take 100 mg by mouth daily. 02/23/17  Yes Historical Provider, MD  senna (SENOKOT) 8.6 MG TABS tablet Take 1 tablet (8.6 mg total) by mouth daily. 02/13/17  Yes [name], MD  thiamine (VITAMIN B-1) 100 MG tablet Take 1 tablet (100 mg total) by mouth daily. 02/21/17  Yes [name], MD  tiotropium (SPIRIVA) 18 MCG inhalation capsule Place 1 capsule (18 mcg total) into inhaler and inhale daily.  06/22/16  Yes [name], MD  traZODone (DESYREL) 50 MG tablet Take 1 tablet (50 mg total) by mouth at bedtime as needed for sleep. 02/21/17  Yes [name], MD  dexamethasone (DECADRON) 4 MG tablet Take '4mg'$  BID the day before and day after chemotherapy. Patient not taking: Reported on 03/02/2017 10/31/16   Lequita Asal, MD  ondansetron (ZOFRAN) 8 MG tablet Take 1 tablet (8 mg total) by mouth 2 (two) times daily as needed for nausea or vomiting. Patient not taking: Reported on 03/05/2017 08/22/16   Lequita Asal, MD  Oxycodone HCl 10 MG TABS Take 1 tablet (10 mg total) by mouth every 4 (four) hours as needed (pain). Patient not taking: Reported on 03/02/2017 02/02/17   Lequita Asal, MD    REVIEW OF SYSTEMS:  Review of Systems  Constitutional: Positive for chills, fever and malaise/fatigue. Negative for weight loss.  HENT: Negative for ear pain, hearing loss and tinnitus.   Eyes: Negative for blurred vision, double vision, pain and redness.  Respiratory: Positive for cough. Negative for hemoptysis and shortness of breath.   Cardiovascular: Negative for chest pain, palpitations, orthopnea and leg swelling.  Gastrointestinal: Negative for abdominal pain, constipation, diarrhea, nausea and vomiting.  Genitourinary: Negative for dysuria, frequency and hematuria.  Musculoskeletal: Negative for back pain, joint pain and neck pain.  Skin:       No acne, rash, or lesions  Neurological: Negative for dizziness, tremors, focal weakness and weakness.  Endo/Heme/Allergies: Negative for polydipsia. Does not bruise/bleed easily.  Psychiatric/Behavioral: Negative for depression. The patient is not nervous/anxious and does not have insomnia.      VITAL SIGNS:   Vitals:   03/12/17 2000 03/12/17 2005 03/12/17 2007 03/12/17 2030  BP: (!) 158/106 (!) 170/103  137/89  Pulse: (!) 139 (!) 137  (!) 138  Resp:  20  15  Temp:  97.4 F (36.3 C)    TempSrc:  Oral    SpO2: 100% 100% 100%  100%  Weight:   52.6 kg (116 lb)   Height:   '4\' 11"'$  (1.499 m)    Wt Readings from Last 3 Encounters:  03/12/17 52.6 kg (116 lb)  03/05/17 53.1 kg (117 lb)  03/02/17 52.5 kg (115 lb 12.8 oz)    PHYSICAL EXAMINATION:  Physical Exam  Vitals reviewed. Constitutional: She is oriented to person, place, and time. She appears well-developed and well-nourished. No distress.  HENT:  Head: Normocephalic and atraumatic.  Mouth/Throat: Oropharynx is clear and moist.  Eyes: Conjunctivae and EOM are normal. Pupils are equal, round, and reactive to light. No scleral icterus.  Neck: Normal range of motion. Neck supple. No  JVD present. No thyromegaly present.  Cardiovascular: Regular rhythm and intact distal pulses.  Exam reveals no gallop and no friction rub.   No murmur heard. Tachycardic  Respiratory: Effort normal. No respiratory distress. She has no wheezes. She has no rales.  Coarse breath sounds left lower lung  GI: Soft. Bowel sounds are normal. She exhibits no distension. There is no tenderness.  Musculoskeletal: Normal range of motion. She exhibits no edema.  No arthritis, no gout  Lymphadenopathy:    She has no cervical adenopathy.  Neurological: She is alert and oriented to person, place, and time. No cranial nerve deficit.  No dysarthria, no aphasia  Skin: Skin is warm and dry. No rash noted. No erythema.  Psychiatric: Her behavior is normal. Judgment and thought content normal.    LABORATORY PANEL:   CBC  Recent Labs Lab 03/12/17 2017  WBC 11.9*  HGB 11.4*  HCT 34.4*  PLT 396   ------------------------------------------------------------------------------------------------------------------  Chemistries   Recent Labs Lab 03/12/17 2017  NA 135  K 2.9*  CL 97*  CO2 26  GLUCOSE 129*  BUN 12  CREATININE 0.63  CALCIUM 10.2  AST 24  ALT 9*  ALKPHOS 67  BILITOT 1.4*    ------------------------------------------------------------------------------------------------------------------  Cardiac Enzymes  Recent Labs Lab 03/12/17 2017  TROPONINI <0.03   ------------------------------------------------------------------------------------------------------------------  RADIOLOGY:  Dg Chest Port 1 View  Result Date: 03/12/2017 CLINICAL DATA:  Acute onset of generalized weakness and sepsis. Initial encounter. EXAM: PORTABLE CHEST 1 VIEW COMPARISON:  Chest radiograph performed 03/05/2017 FINDINGS: A 3.0 cm opacity is noted at the left midlung zone, with central lucency, concerning for central necrosis. Given interval development since the recent prior study, this is most likely infectious in nature. Atypical infections such as Actinomyces or Nocardia could have such an appearance. Tuberculosis cannot be excluded. There is elevation of the left hemidiaphragm. No pleural effusion or pneumothorax is seen. IMPRESSION: Enlarging 3.0 cm opacity at the left midlung zone, with apparent central necrosis. Given interval development since the recent prior study, this is most likely infectious in nature. Atypical infections such as Actinomyces or Nocardia could have such an appearance. Tuberculosis cannot be excluded. These results were called by telephone at the time of interpretation on 03/12/2017 at 8:39 pm to the ER team, who verbally acknowledged these results. Electronically Signed   By: Garald Balding M.D.   On: 03/12/2017 20:44    EKG:   Orders placed or performed during the hospital encounter of 03/12/17  . EKG 12-Lead  . EKG 12-Lead    IMPRESSION AND PLAN:  Principal Problem:   Sepsis ([place]) - broad spectrum IV antibiotics started in the ED, lactic acid within normal limits, blood pressure stable, cultures sent, acid-fast smear and culture also ordered Active Problems:   HCAP (healthcare-associated pneumonia) - cavitary lesion seems infectious etiology, atypical  for standard pathogens for H CAP. TB is certainly in the differential. Workup in process, infectious disease consult   COPD (chronic obstructive pulmonary disease) ([place]) - continue home inhalers   Hypothyroidism - home dose thyroid replacement   Hypokalemia - replace and monitor  All the records are reviewed and case discussed with ED provider. Management plans discussed with the patient and/or family.  DVT PROPHYLAXIS: SubQ lovenox  GI PROPHYLAXIS: PPI  ADMISSION STATUS: Inpatient  CODE STATUS: Full Code Status History    Date Active Date Inactive Code Status Order ID Comments User Context   03/02/2017 11:01 PM 03/04/2017  3:20 PM Full Code [id number]  Shanon Brow  Jannifer Franklin, MD Inpatient   02/07/2017 10:26 AM 02/21/2017  8:14 PM Full Code [id number]  Saundra Shelling, MD Inpatient   11/07/2016  6:18 AM 11/11/2016  2:39 PM Full Code [id number]  Harrie Foreman, MD Inpatient   06/21/2016 11:52 AM 06/22/2016  3:07 PM Full Code [id number]  [name], MD Inpatient      TOTAL TIME TAKING CARE OF THIS PATIENT: 45 minutes.    Conna Terada [place] 03/12/2017, 10:27 PM  [facility]  Office  [phone]  CC: Primary care physician; Donnie Coffin, MD

## 2017-03-13 LAB — URINALYSIS, ROUTINE W REFLEX MICROSCOPIC
BILIRUBIN URINE: NEGATIVE
Glucose, UA: NEGATIVE mg/dL
Hgb urine dipstick: NEGATIVE
KETONES UR: NEGATIVE mg/dL
LEUKOCYTES UA: NEGATIVE
NITRITE: NEGATIVE
PH: 6 (ref 5.0–8.0)
Protein, ur: NEGATIVE mg/dL
SPECIFIC GRAVITY, URINE: 1.034 — AB (ref 1.005–1.030)

## 2017-03-13 LAB — RAPID HIV SCREEN (HIV 1/2 AB+AG)
HIV 1/2 Antibodies: NONREACTIVE
HIV-1 P24 ANTIGEN - HIV24: NONREACTIVE

## 2017-03-13 LAB — T4, FREE: FREE T4: 2.34 ng/dL — AB (ref 0.61–1.12)

## 2017-03-13 LAB — BASIC METABOLIC PANEL
Anion gap: 9 (ref 5–15)
BUN: 8 mg/dL (ref 6–20)
CALCIUM: 9.3 mg/dL (ref 8.9–10.3)
CO2: 22 mmol/L (ref 22–32)
CREATININE: 0.4 mg/dL — AB (ref 0.44–1.00)
Chloride: 105 mmol/L (ref 101–111)
GFR calc Af Amer: >60 mL/min (ref >60)
GFR calc non Af Amer: >60 mL/min (ref >60)
GLUCOSE: 89 mg/dL (ref 65–99)
Potassium: 3.3 mmol/L — ABNORMAL LOW (ref 3.5–5.1)
Sodium: 136 mmol/L (ref 135–145)

## 2017-03-13 LAB — MRSA PCR SCREENING: MRSA by PCR: NEGATIVE

## 2017-03-13 LAB — CBC
HCT: 27.4 % — ABNORMAL LOW (ref 35.0–47.0)
Hemoglobin: 8.9 g/dL — ABNORMAL LOW (ref 12.0–16.0)
MCH: 29.8 pg (ref 26.0–34.0)
MCHC: 32.5 g/dL (ref 32.0–36.0)
MCV: 91.6 fL (ref 80.0–100.0)
PLATELETS: 363 10*3/uL (ref 150–440)
RBC: 2.99 MIL/uL — ABNORMAL LOW (ref 3.80–5.20)
RDW: 13.7 % (ref 11.5–14.5)
WBC: 11.2 10*3/uL — ABNORMAL HIGH (ref 3.6–11.0)

## 2017-03-13 LAB — LACTIC ACID, PLASMA: Lactic Acid, Venous: 1.1 mmol/L (ref 0.5–1.9)

## 2017-03-13 LAB — INFLUENZA PANEL BY PCR (TYPE A & B)
INFLAPCR: NEGATIVE
INFLBPCR: NEGATIVE

## 2017-03-13 LAB — TSH: TSH: 0.013 u[IU]/mL — AB (ref 0.350–4.500)

## 2017-03-13 MED ORDER — ONDANSETRON HCL 4 MG PO TABS: 4.0000 mg | ORAL_TABLET | Freq: Four times a day (QID) | ORAL | Status: DC | PRN

## 2017-03-13 MED ORDER — TIOTROPIUM BROMIDE MONOHYDRATE 18 MCG IN CAPS
18.0000 ug | ORAL_CAPSULE | Freq: Every day | RESPIRATORY_TRACT | Status: DC
  Administered 2017-03-13 – 2017-03-18 (×6): 18 ug via RESPIRATORY_TRACT
  Filled 2017-03-13: qty 5

## 2017-03-13 MED ORDER — PANTOPRAZOLE SODIUM 40 MG PO TBEC
40.0000 mg | DELAYED_RELEASE_TABLET | Freq: Every day | ORAL | Status: DC
  Administered 2017-03-13 – 2017-03-18 (×6): 40 mg via ORAL
  Filled 2017-03-13 (×6): qty 1

## 2017-03-13 MED ORDER — PNEUMOCOCCAL VAC POLYVALENT 25 MCG/0.5ML IJ INJ
0.5000 mL | INJECTION | INTRAMUSCULAR | Status: DC
  Filled 2017-03-13 (×2): qty 0.5

## 2017-03-13 MED ORDER — TRAZODONE HCL 50 MG PO TABS
50.0000 mg | ORAL_TABLET | Freq: Every evening | ORAL | Status: DC | PRN
  Administered 2017-03-13 – 2017-03-14 (×2): 50 mg via ORAL
  Filled 2017-03-13 (×3): qty 1

## 2017-03-13 MED ORDER — POTASSIUM CHLORIDE CRYS ER 20 MEQ PO TBCR
20.0000 meq | EXTENDED_RELEASE_TABLET | Freq: Two times a day (BID) | ORAL | Status: AC
  Administered 2017-03-13 (×2): 20 meq via ORAL
  Filled 2017-03-13 (×2): qty 1

## 2017-03-13 MED ORDER — FOLIC ACID 800 MCG PO TABS: 800.0000 ug | ORAL_TABLET | Freq: Every day | ORAL | Status: DC

## 2017-03-13 MED ORDER — MEGESTROL ACETATE 40 MG/ML PO SUSP
400.0000 mg | Freq: Two times a day (BID) | ORAL | Status: DC
  Administered 2017-03-13 – 2017-03-18 (×10): 400 mg via ORAL
  Filled 2017-03-13 (×13): qty 10

## 2017-03-13 MED ORDER — ALPRAZOLAM 0.25 MG PO TABS
0.2500 mg | ORAL_TABLET | Freq: Three times a day (TID) | ORAL | Status: DC | PRN
Start: 2017-03-13 — End: 2017-03-18
  Administered 2017-03-13 – 2017-03-17 (×3): 0.25 mg via ORAL
  Filled 2017-03-13 (×4): qty 1

## 2017-03-13 MED ORDER — METHIMAZOLE 5 MG PO TABS
5.0000 mg | ORAL_TABLET | Freq: Every day | ORAL | Status: DC
  Administered 2017-03-13 – 2017-03-18 (×6): 5 mg via ORAL
  Filled 2017-03-13 (×6): qty 1

## 2017-03-13 MED ORDER — VANCOMYCIN HCL IN DEXTROSE 750-5 MG/150ML-% IV SOLN
750.0000 mg | INTRAVENOUS | Status: DC
  Administered 2017-03-13: 750 mg via INTRAVENOUS
  Filled 2017-03-13: qty 150

## 2017-03-13 MED ORDER — SODIUM CHLORIDE 0.9 % IV SOLN
3.0000 g | Freq: Four times a day (QID) | INTRAVENOUS | Status: DC
  Administered 2017-03-13 – 2017-03-18 (×14): 3 g via INTRAVENOUS
  Filled 2017-03-13 (×24): qty 3

## 2017-03-13 MED ORDER — IPRATROPIUM-ALBUTEROL 0.5-2.5 (3) MG/3ML IN SOLN
3.0000 mL | RESPIRATORY_TRACT | Status: DC
  Administered 2017-03-13 – 2017-03-14 (×4): 3 mL via RESPIRATORY_TRACT
  Filled 2017-03-13 (×4): qty 3

## 2017-03-13 MED ORDER — FOLIC ACID 1 MG PO TABS
1.0000 mg | ORAL_TABLET | Freq: Every day | ORAL | Status: DC
  Administered 2017-03-13 – 2017-03-18 (×5): 1 mg via ORAL
  Filled 2017-03-13 (×6): qty 1

## 2017-03-13 MED ORDER — ENOXAPARIN SODIUM 40 MG/0.4ML ~~LOC~~ SOLN
40.0000 mg | SUBCUTANEOUS | Status: DC
  Administered 2017-03-13 – 2017-03-16 (×3): 40 mg via SUBCUTANEOUS
  Filled 2017-03-13 (×5): qty 0.4

## 2017-03-13 MED ORDER — VITAMIN B-1 100 MG PO TABS
100.0000 mg | ORAL_TABLET | Freq: Every day | ORAL | Status: DC
  Administered 2017-03-13 – 2017-03-18 (×6): 100 mg via ORAL
  Filled 2017-03-13 (×6): qty 1

## 2017-03-13 MED ORDER — ORAL CARE MOUTH RINSE
15.0000 mL | Freq: Two times a day (BID) | OROMUCOSAL | Status: DC
  Administered 2017-03-13 – 2017-03-18 (×4): 15 mL via OROMUCOSAL

## 2017-03-13 MED ORDER — ONDANSETRON HCL 4 MG/2ML IJ SOLN
4.0000 mg | Freq: Four times a day (QID) | INTRAMUSCULAR | Status: DC | PRN
  Administered 2017-03-13: 4 mg via INTRAVENOUS
  Filled 2017-03-13 (×2): qty 2

## 2017-03-13 MED ORDER — ENSURE ENLIVE PO LIQD
237.0000 mL | Freq: Two times a day (BID) | ORAL | Status: DC
  Administered 2017-03-13 – 2017-03-18 (×5): 237 mL via ORAL

## 2017-03-13 MED ORDER — INFLUENZA VAC SPLIT QUAD 0.5 ML IM SUSY
0.5000 mL | PREFILLED_SYRINGE | INTRAMUSCULAR | Status: DC
  Filled 2017-03-13 (×2): qty 0.5

## 2017-03-13 MED ORDER — CEFEPIME HCL 2 G IJ SOLR
2.0000 g | Freq: Two times a day (BID) | INTRAMUSCULAR | Status: DC
  Administered 2017-03-13: 2 g via INTRAVENOUS
  Filled 2017-03-13 (×2): qty 2

## 2017-03-13 MED ORDER — ACETAMINOPHEN 325 MG PO TABS
650.0000 mg | ORAL_TABLET | Freq: Four times a day (QID) | ORAL | Status: DC | PRN
  Administered 2017-03-17: 650 mg via ORAL
  Filled 2017-03-13: qty 2

## 2017-03-13 MED ORDER — MORPHINE SULFATE (CONCENTRATE) 10 MG/0.5ML PO SOLN
10.0000 mg | ORAL | Status: DC | PRN
  Administered 2017-03-13 (×2): 10 mg via ORAL
  Filled 2017-03-13 (×3): qty 1

## 2017-03-13 MED ORDER — METOPROLOL TARTRATE 25 MG PO TABS
25.0000 mg | ORAL_TABLET | Freq: Two times a day (BID) | ORAL | Status: DC
Start: 2017-03-13 — End: 2017-03-18
  Administered 2017-03-13 – 2017-03-18 (×10): 25 mg via ORAL
  Filled 2017-03-13 (×14): qty 1

## 2017-03-13 MED ORDER — IPRATROPIUM-ALBUTEROL 0.5-2.5 (3) MG/3ML IN SOLN
3.0000 mL | RESPIRATORY_TRACT | Status: DC
  Administered 2017-03-13: 3 mL via RESPIRATORY_TRACT
  Filled 2017-03-13: qty 3

## 2017-03-13 MED ORDER — SODIUM CHLORIDE 0.9 % IV SOLN
INTRAVENOUS | Status: AC
  Administered 2017-03-13: 03:00:00 via INTRAVENOUS

## 2017-03-13 MED ORDER — ACETAMINOPHEN 650 MG RE SUPP: 650.0000 mg | Freq: Four times a day (QID) | RECTAL | Status: DC | PRN

## 2017-03-13 NOTE — Care Management (Signed)
Patient was open to [facility] and discharged yesterday. She was suppose to be opened to Hospice services with [facility] today but admitted. Plan is hospice services at [facility].

## 2017-03-13 NOTE — ED Notes (Signed)
Called floor let them know patient on the way

## 2017-03-13 NOTE — Progress Notes (Signed)
[facility] [place] NAME: Audrey Mcgee    MR#:  [id number]  DATE OF BIRTH:  Jan 30, 1961  SUBJECTIVE:   Patient feeling weak not well   REVIEW OF SYSTEMS:    Review of Systems  Constitutional: Positive for malaise/fatigue and weight loss. Negative for chills and fever.  HENT: Negative.  Negative for ear discharge, ear pain, hearing loss, nosebleeds and sore throat.   Eyes: Negative.  Negative for blurred vision and pain.  Respiratory: Positive for cough and shortness of breath. Negative for hemoptysis and wheezing.   Cardiovascular: Negative.  Negative for chest pain, palpitations and leg swelling.  Gastrointestinal: Negative.  Negative for abdominal pain, blood in stool, diarrhea, nausea and vomiting.  Genitourinary: Negative.  Negative for dysuria.  Musculoskeletal: Negative.  Negative for back pain.  Skin: Negative.   Neurological: Positive for weakness. Negative for dizziness, tremors, speech change, focal weakness, seizures and headaches.  Endo/Heme/Allergies: Negative.  Does not bruise/bleed easily.  Psychiatric/Behavioral: Negative.  Negative for depression, hallucinations and suicidal ideas.    Tolerating Diet:yes      DRUG ALLERGIES:   Allergies  Allergen Reactions  . No Known Allergies     VITALS:  Blood pressure 130/90, pulse (!) 103, temperature 98 F (36.7 C), temperature source Oral, resp. rate 19, height '4\' 11"'$  (1.499 m), weight 54.6 kg (120 lb 6.4 oz), last menstrual period 01/30/2008, SpO2 99 %.  PHYSICAL EXAMINATION:   Physical Exam  Constitutional: She is oriented to person, place, and time. No distress.  Frail thin  HENT:  Head: Normocephalic.  Eyes: No scleral icterus.  Neck: Normal range of motion. Neck supple. No JVD present. No tracheal deviation present.  Cardiovascular: Normal rate, regular rhythm and normal heart sounds.  Exam reveals no gallop and no friction rub.   No murmur  heard. Pulmonary/Chest: Effort normal and breath sounds normal. No respiratory distress. She has no wheezes. She has no rales. She exhibits no tenderness.  Abdominal: Soft. Bowel sounds are normal. She exhibits no distension and no mass. There is no tenderness. There is no rebound and no guarding.  Musculoskeletal: Normal range of motion. She exhibits no edema.  Neurological: She is alert and oriented to person, place, and time.  Skin: Skin is warm. No rash noted. No erythema.  Psychiatric: Affect and judgment normal.      LABORATORY PANEL:   CBC  Recent Labs Lab 03/13/17 0317  WBC 11.2*  HGB 8.9*  HCT 27.4*  PLT 363   ------------------------------------------------------------------------------------------------------------------  Chemistries   Recent Labs Lab 03/12/17 2017 03/13/17 0317  NA 135 136  K 2.9* 3.3*  CL 97* 105  CO2 26 22  GLUCOSE 129* 89  BUN 12 8  CREATININE 0.63 0.40*  CALCIUM 10.2 9.3  AST 24  --   ALT 9*  --   ALKPHOS 67  --   BILITOT 1.4*  --    ------------------------------------------------------------------------------------------------------------------  Cardiac Enzymes  Recent Labs Lab 03/12/17 2017  TROPONINI <0.03   ------------------------------------------------------------------------------------------------------------------  RADIOLOGY:  Ct Chest W Contrast  Result Date: 03/12/2017 CLINICAL DATA:  Generalized weakness tachycardia EXAM: CT CHEST WITH CONTRAST TECHNIQUE: Multidetector CT imaging of the chest was performed during intravenous contrast administration. CONTRAST:  50m ISOVUE-300 IOPAMIDOL (ISOVUE-300) INJECTION 61% COMPARISON:  Chest x-ray 03/12/2017, PET-CT 01/29/2017, CT chest 07/08/2017 FINDINGS: Cardiovascular: Non aneurysmal aorta. Heart size normal. No large pericardial effusion. Mediastinum/Nodes: Thyroid within normal limits. Midline trachea. No gross enlarged mediastinal nodes. Right hilar adenopathy  up to  11 mm. Esophagus within normal limits. Lungs/Pleura: Bronchiectasis and stellate densities in the left upper lobe are unchanged and most consistent with post treatment change. Interim finding of ground-glass density in the subpleural left lower lobe ; at the center of this is a cavitary lesion measuring 2.6 x 2.2 cm. Emphysematous disease is present. No pleural effusion. Additional subpleural focus of ground-glass density in the left lower lobe, series 3, image number 62. Upper Abdomen: Partially visualized right crural adenopathy measuring 11 mm. Partially visualized left adrenal gland nodularity. Musculoskeletal: Partially calcified nodule within the slightly lower inner right breast unchanged. No acute or suspicious bone lesion. IMPRESSION: 1. 2.6 cm cavitary lesion in the subpleural left lower lobe with surrounding ground-glass density. Additional left posterior subpleural foci of ground-glass density. Differential considerations include septic embolus, cavitary neoplasm or cavitary metastatic focus, and cavitary infectious process to include mycobacterial etiology and atypical entities especially immune compromised. 2. Bronchiectasis and multifocal stellate densities in the left upper lobe favor post treatment age. Emphysematous disease. 3. Mild right hilar adenopathy. Partially visualized right crural adenopathy and left adrenal gland nodularity. 4. 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   On: 03/12/2017 23:35   Dg Chest Port 1 View  Result Date: 03/12/2017 CLINICAL DATA:  Acute onset of generalized weakness and sepsis. Initial encounter. EXAM: PORTABLE CHEST 1 VIEW COMPARISON:  Chest radiograph performed 03/05/2017 FINDINGS: A 3.0 cm opacity is noted at the left midlung zone, with central lucency, concerning for central necrosis. Given interval development since the recent prior study, this is most likely infectious in  nature. Atypical infections such as Actinomyces or Nocardia could have such an appearance. Tuberculosis cannot be excluded. There is elevation of the left hemidiaphragm. No pleural effusion or pneumothorax is seen. IMPRESSION: Enlarging 3.0 cm opacity at the left midlung zone, with apparent central necrosis. Given interval development since the recent prior study, this is most likely infectious in nature. Atypical infections such as Actinomyces or Nocardia could have such an appearance. Tuberculosis cannot be excluded. These results were called by telephone at the time of interpretation on 03/12/2017 at 8:39 pm to the ER team, who verbally acknowledged these results. Electronically Signed   By: Garald Balding M.D.   On: 03/12/2017 20:44     ASSESSMENT AND PLAN:    56 year old female with history of metastatic poorly differentiated adenocarcinoma of lung followed by Dr Mike Gip here with weakness and fevers.   1. Sepsis: Patient presents with leukocytosis and tachycardia. Sepsis is presumed to be due to pneumonia. Due to CT scan showing possible cavitary lesion she is being evaluated for tuberculosis with AFBs. Follow up on ID consult. Follow up on final blood cultures. It should be noted patient has had previous hospitalizations for left lower lobe pneumonia Continue Maxipime and vancomycin  2. Hypokalemia: Replete and recheck in a.m.  3. COPD without exacerbation: Continue inhalers  4. GERD: Continue PPI  5. Essential hypertension: Continue metoprolol  6. History of metastatic poorly differentiated adenocarcinoma of lung: Consult oncology  7. Moderate/severe protein calorie malnutrition: Dietary consult Continue Megace  8. Hyperthyroidism: Continue Tapazole Obtain thyroid function tests   Management plans discussed with the patient and she is in agreement.  CODE STATUS: full  TOTAL TIME TAKING CARE OF THIS PATIENT: 30 minutes.     POSSIBLE D/C 2-3 days, DEPENDING ON CLINICAL  CONDITION.   Kerigan Narvaez M.D on 03/13/2017 at 11:28 AM  Between 7am to 6pm - Pager - [phone] After 6pm go to [email or web address] - password [email or web address]  Office  [phone]  CC: Primary care physician; Donnie Coffin, MD  Note: This dictation was prepared with Dragon dictation along with smaller phrase technology. Any transcriptional errors that result from this process are unintentional.

## 2017-03-13 NOTE — Plan of Care (Signed)
Problem: Nutrition: Goal: Adequate nutrition will be maintained Outcome: Not Progressing Pt po intake is inadequate. Pt takes an appetite enhancer
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
## 2017-03-13 NOTE — Plan of Care (Signed)
Problem: Safety: Goal: Ability to remain free from injury will improve Outcome: Not Progressing Pt has memory impairment (has hx of dementia

## 2017-03-13 NOTE — Progress Notes (Addendum)
Pharmacy Antibiotic Note  Audrey Mcgee is a 56 y.o. female admitted on 03/12/2017 with pneumonia.  Pharmacy has been consulted for vancomycin dosing.  Plan: DW 53kg  Vd 33L kei 0.054 hr-1  t1/2 13 hours Vancomycin 750 mg q 18 hours ordered with stacked dosing. Level before 5th dose. Goal trough 15-20.  Cefepime 2 grams q 12 hours ordered.   Height: '4\' 11"'$  (149.9 cm) Weight: 116 lb (52.6 kg) IBW/kg (Calculated) : 43.2  Temp (24hrs), Avg:97.4 F (36.3 C), Min:97.4 F (36.3 C), Max:97.4 F (36.3 C)   Recent Labs Lab 03/12/17 2017  WBC 11.9*  CREATININE 0.63  LATICACIDVEN 2.5*    Estimated Creatinine Clearance: 59.6 mL/min (by C-G formula based on SCr of 0.63 mg/dL).    Allergies  Allergen Reactions  . No Known Allergies     Antimicrobials this admission: vancomycin 3/22 >>  Cefepime 3/22 x1 >>   Dose adjustments this admission:   Microbiology results: 3/22 BCx: pending 3/22 AFB: pending     3/22 CXR: L midlung opacity Thank you for allowing pharmacy to be a part of this patient's care.  Florinda Taflinger S 03/13/2017 12:46 AM

## 2017-03-13 NOTE — Plan of Care (Signed)
Problem: Education: Goal: Knowledge of [facility] Education information/materials will improve Outcome: Not Progressing Pt has stage 4 lung cancer and hx of dementia

## 2017-03-13 NOTE — Plan of Care (Signed)
Problem: Safety: Goal: Ability to remain free from injury will improve Outcome: Not Progressing Generalized weakness. Pt requires assistance with mobility. High fall risk.  Bed alarm initiated

## 2017-03-13 NOTE — Progress Notes (Signed)
Initial Nutrition Assessment  DOCUMENTATION CODES:   Severe malnutrition in context of chronic illness  INTERVENTION:  Recommend liberalizing diet from Heart Healthy to Regular.  Provide [employer] Breakfast in 8 oz whole milk TID with meals, each serving provides 280 kcal and 13 grams of protein.  Provide Ensure Enlive po BID, each supplement provides 350 kcal and 20 grams of protein.  Will monitor outcome of discussions regarding goals of care.  NUTRITION DIAGNOSIS:   Malnutrition (Severe) related to chronic illness (stage IV lung cancer) as evidenced by severe depletion of body fat, severe depletion of muscle mass.  GOAL:   Patient will meet greater than or equal to 90% of their needs  MONITOR:   PO intake, Supplement acceptance, Labs, Weight trends, I & O's  REASON FOR ASSESSMENT:   Malnutrition Screening Tool, Consult Assessment of nutrition requirement/status  ASSESSMENT:   56 year old female with PMHx of metastatic stage IV lung cancer, COPD, HTN, Dementia, who was followed by [facility] at home presents with fevers, chills, weakness, malaise found to meet sepsis criteria and also found with lesion on chest x-ray with central necrosis.   -Per chart patient was just discharged from [facility] in 3/22 and was to be opened with Hospice services today. -Patient still pending Oncology consult.  Patient known to this RD from several previous admissions. Patient is very lethargic today but able to provide some history. No family at bedside. Patient reports when she last discharged from the hospital she was eating about 2-2.5 meals per day. Patient could not provide any further details on intake. She reports she did not ever purchase any Ensure or other ONS to drink at home. Patient endorses N/V and abdominal pain. She reports over the past 3 days she has not been able to eat well at all. She has only had bites of meals. Noted patient had not eaten breakfast tray in room at  time of assessment.   Patient's UBW was 127 lbs. Patient currently 120.4 lbs per chart, but unsure accuracy of this weight as she has recorded weights earlier this month as low as 108 lbs. Patient unable to provide a recent weight history.  Medications reviewed and include: folic aid 1 mg daily, Medline mouth rinse, Megace 400 mg BID, pantoprazole, thiamine 100 mg daily, vancomycin, Xanax PRN, morphine PRN, Zofran 4 mg Q6hrs PRN.  Labs reviewed: Potassium 3.3, Creatinine 0.4.  Nutrition-Focused physical exam completed. Findings are severe fat depletion, severe muscle depletion, and no edema.   Diet Order:  Diet Heart Room service appropriate? Yes; Fluid consistency: Thin  Skin:  Reviewed, no issues  Last BM:  03/12/2017  Height:   Ht Readings from Last 1 Encounters:  03/12/17 '4\' 11"'$  (1.499 m)    Weight:   Wt Readings from Last 1 Encounters:  03/13/17 120 lb 6.4 oz (54.6 kg)    Ideal Body Weight:  54.5 kg  BMI:  Body mass index is 24.32 kg/m.  Estimated Nutritional Needs:   Kcal:  1640-1910 (30-35 kcal/kg)  Protein:  80-100 grams (1.5-1.8 grams/kg)  Fluid:  1.6 L/day (30 ml/kg)  EDUCATION NEEDS:   Education needs no appropriate at this time  Willey Blade, MS, RD, LDN Pager: [phone] After Hours Pager: [phone]

## 2017-03-13 NOTE — Consult Note (Signed)
Pharmacy Antibiotic Note  Audrey Mcgee is a 56 y.o. female admitted on 03/12/2017 with aspiration PNA.  Pharmacy has been consulted for unasyn dosing.  Plan: unasyn 3g q 6 hours   Height: '4\' 11"'$  (149.9 cm) Weight: 120 lb 6.4 oz (54.6 kg) IBW/kg (Calculated) : 43.2  Temp (24hrs), Avg:98 F (36.7 C), Min:97.4 F (36.3 C), Max:98.5 F (36.9 C)   Recent Labs Lab 03/12/17 2017 03/13/17 0034 03/13/17 0317  WBC 11.9*  --  11.2*  CREATININE 0.63  --  0.40*  LATICACIDVEN 2.5* 1.1  --     Estimated Creatinine Clearance: 60.7 mL/min (A) (by C-G formula based on SCr of 0.4 mg/dL (L)).    Allergies  Allergen Reactions  . No Known Allergies     Antimicrobials this admission: vancomycin 3/22 >> 3/23 Cefepime 3/22 x1 >> 3/23 unasyn 3/23>>>  Dose adjustments this admission:   Microbiology results: Recent Results (from the past 240 hour(s))  Blood culture (routine x 2)     Status: None   Collection Time: 03/05/17  4:04 PM  Result Value Ref Range Status   Specimen Description BLOOD L ARM  Final   Special Requests BOTTLES DRAWN AEROBIC AND ANAEROBIC BCLV  Final   Culture NO GROWTH 5 DAYS  Final   Report Status 03/10/2017 FINAL  Final  Blood culture (routine x 2)     Status: None   Collection Time: 03/05/17  4:05 PM  Result Value Ref Range Status   Specimen Description BLOOD R WRIST  Final   Special Requests BOTTLES DRAWN AEROBIC AND ANAEROBIC BCLV  Final   Culture NO GROWTH 5 DAYS  Final   Report Status 03/10/2017 FINAL  Final  Blood culture (routine x 2)     Status: None (Preliminary result)   Collection Time: 03/12/17 10:18 PM  Result Value Ref Range Status   Specimen Description BLOOD R HAND  Final   Special Requests BOTTLES DRAWN AEROBIC AND ANAEROBIC BCAV  Final   Culture NO GROWTH < 12 HOURS  Final   Report Status PENDING  Incomplete  Blood culture (routine x 2)     Status: None (Preliminary result)   Collection Time: 03/12/17 10:18 PM  Result Value Ref  Range Status   Specimen Description BLOOD L AC  Final   Special Requests BOTTLES DRAWN AEROBIC AND ANAEROBIC BCAV  Final   Culture NO GROWTH < 12 HOURS  Final   Report Status PENDING  Incomplete     Thank you for allowing pharmacy to be a part of this patient's care.  Ramond Dial, Pharm.D, BCPS Clinical Pharmacist  03/13/2017 4:13 PM

## 2017-03-13 NOTE — Progress Notes (Signed)
Unable to obtain sputum spec. Instructed pt in use of flutter valve to mobilize secretions.

## 2017-03-13 NOTE — Progress Notes (Addendum)
Follow up on patient who was to be opened to Hospice services today and is post discharge from [facility] yesterday- who was admitted at [facility] for sepsis presenting with leukocytosis and tachycardia presumed r/t pneumonia. Post CT scan- showing possible cavitary lesion, being evaluated for tuberculosis with AFB and placed on in isolation bed. Awaiting ID consult. No plans for discharge- will continue to follow through final disposition.

## 2017-03-14 DIAGNOSIS — I10 Essential (primary) hypertension: Secondary | ICD-10-CM

## 2017-03-14 DIAGNOSIS — Z923 Personal history of irradiation: Secondary | ICD-10-CM

## 2017-03-14 DIAGNOSIS — Z9221 Personal history of antineoplastic chemotherapy: Secondary | ICD-10-CM

## 2017-03-14 DIAGNOSIS — Z803 Family history of malignant neoplasm of breast: Secondary | ICD-10-CM

## 2017-03-14 DIAGNOSIS — R918 Other nonspecific abnormal finding of lung field: Secondary | ICD-10-CM

## 2017-03-14 DIAGNOSIS — F17210 Nicotine dependence, cigarettes, uncomplicated: Secondary | ICD-10-CM

## 2017-03-14 DIAGNOSIS — F0390 Unspecified dementia without behavioral disturbance: Secondary | ICD-10-CM

## 2017-03-14 DIAGNOSIS — A419 Sepsis, unspecified organism: Principal | ICD-10-CM

## 2017-03-14 DIAGNOSIS — C3492 Malignant neoplasm of unspecified part of left bronchus or lung: Secondary | ICD-10-CM

## 2017-03-14 DIAGNOSIS — E039 Hypothyroidism, unspecified: Secondary | ICD-10-CM

## 2017-03-14 DIAGNOSIS — Z79899 Other long term (current) drug therapy: Secondary | ICD-10-CM

## 2017-03-14 DIAGNOSIS — E538 Deficiency of other specified B group vitamins: Secondary | ICD-10-CM

## 2017-03-14 DIAGNOSIS — J449 Chronic obstructive pulmonary disease, unspecified: Secondary | ICD-10-CM

## 2017-03-14 DIAGNOSIS — R599 Enlarged lymph nodes, unspecified: Secondary | ICD-10-CM

## 2017-03-14 LAB — CREATININE, SERUM: Creatinine, Ser: 0.42 mg/dL — ABNORMAL LOW (ref 0.44–1.00)

## 2017-03-14 LAB — T3 UPTAKE: T3 Uptake Ratio: 41 % — ABNORMAL HIGH (ref 24–39)

## 2017-03-14 LAB — CRYPTOCOCCUS ANTIGEN, SERUM: CRYPTOCOCCUS ANTIGEN, SERUM: NEGATIVE

## 2017-03-14 MED ORDER — IPRATROPIUM-ALBUTEROL 0.5-2.5 (3) MG/3ML IN SOLN
3.0000 mL | Freq: Four times a day (QID) | RESPIRATORY_TRACT | Status: DC
  Administered 2017-03-14 – 2017-03-16 (×7): 3 mL via RESPIRATORY_TRACT
  Filled 2017-03-14 (×10): qty 3

## 2017-03-14 MED ORDER — IPRATROPIUM-ALBUTEROL 0.5-2.5 (3) MG/3ML IN SOLN: 3.0000 mL | RESPIRATORY_TRACT | Status: DC | PRN

## 2017-03-14 NOTE — Progress Notes (Signed)
[facility] [place] [facility] [place] NAME: Audrey Mcgee    MR#:  [id number]  DATE OF BIRTH:  05-26-1968  SUBJECTIVE:   Patient feeling weak not well   REVIEW OF SYSTEMS:    Review of Systems  Constitutional: Positive for malaise/fatigue and weight loss. Negative for chills and fever.  HENT: Negative.  Negative for ear discharge, ear pain, hearing loss, nosebleeds and sore throat.   Eyes: Negative.  Negative for blurred vision and pain.  Respiratory: Positive for cough and shortness of breath. Negative for hemoptysis and wheezing.   Cardiovascular: Negative.  Negative for chest pain, palpitations and leg swelling.  Gastrointestinal: Negative.  Negative for abdominal pain, blood in stool, diarrhea, nausea and vomiting.  Genitourinary: Negative.  Negative for dysuria.  Musculoskeletal: Negative.  Negative for back pain.  Skin: Negative.   Neurological: Positive for weakness. Negative for dizziness, tremors, speech change, focal weakness, seizures and headaches.  Endo/Heme/Allergies: Negative.  Does not bruise/bleed easily.  Psychiatric/Behavioral: Negative.  Negative for depression, hallucinations and suicidal ideas.    Tolerating Diet:yes      DRUG ALLERGIES:   Allergies  Allergen Reactions  . No Known Allergies     VITALS:  Blood pressure (!) 142/86, pulse (!) 102, temperature 98.1 F (36.7 C), temperature source Oral, resp. rate 20, height '4\' 11"'$  (1.499 m), weight 54.6 kg (120 lb 6.4 oz), last menstrual period 01/30/2008, SpO2 100 %.  PHYSICAL EXAMINATION:   Physical Exam  Constitutional: She is oriented to person, place, and time. No distress.  Frail thin  HENT:  Head: Normocephalic.  Eyes: No scleral icterus.  Neck: Normal range of motion. Neck supple. No JVD present. No tracheal deviation present.  Cardiovascular: Normal rate, regular rhythm and normal heart sounds.  Exam reveals no gallop and no friction rub.   No murmur  heard. Pulmonary/Chest: Effort normal and breath sounds normal. No respiratory distress. She has no wheezes. She has no rales. She exhibits no tenderness.  Abdominal: Soft. Bowel sounds are normal. She exhibits no distension and no mass. There is no tenderness. There is no rebound and no guarding.  Musculoskeletal: Normal range of motion. She exhibits no edema.  Neurological: She is alert and oriented to person, place, and time.  Skin: Skin is warm. No rash noted. No erythema.  Psychiatric: Affect and judgment normal.      LABORATORY PANEL:   CBC  Recent Labs Lab 03/13/17 0317  WBC 11.2*  HGB 8.9*  HCT 27.4*  PLT 363   ------------------------------------------------------------------------------------------------------------------  Chemistries   Recent Labs Lab 03/12/17 2017 03/13/17 0317 03/14/17 0422  NA 135 136  --   K 2.9* 3.3*  --   CL 97* 105  --   CO2 26 22  --   GLUCOSE 129* 89  --   BUN 12 8  --   CREATININE 0.63 0.40* 0.42*  CALCIUM 10.2 9.3  --   AST 24  --   --   ALT 9*  --   --   ALKPHOS 67  --   --   BILITOT 1.4*  --   --    ------------------------------------------------------------------------------------------------------------------  Cardiac Enzymes  Recent Labs Lab 03/12/17 2017  TROPONINI <0.03   ------------------------------------------------------------------------------------------------------------------  RADIOLOGY:  Ct Chest W Contrast  Result Date: 03/12/2017 CLINICAL DATA:  Generalized weakness tachycardia EXAM: CT CHEST WITH CONTRAST TECHNIQUE: Multidetector CT imaging of the chest was performed during intravenous contrast administration. CONTRAST:  12m ISOVUE-300 IOPAMIDOL (ISOVUE-300) INJECTION 61%  COMPARISON:  Chest x-ray 03/12/2017, PET-CT 01/29/2017, CT chest 07/08/2017 FINDINGS: Cardiovascular: Non aneurysmal aorta. Heart size normal. No large pericardial effusion. Mediastinum/Nodes: Thyroid within normal limits. Midline  trachea. No gross enlarged mediastinal nodes. Right hilar adenopathy up to 11 mm. Esophagus within normal limits. Lungs/Pleura: Bronchiectasis and stellate densities in the left upper lobe are unchanged and most consistent with post treatment change. Interim finding of ground-glass density in the subpleural left lower lobe ; at the center of this is a cavitary lesion measuring 2.6 x 2.2 cm. Emphysematous disease is present. No pleural effusion. Additional subpleural focus of ground-glass density in the left lower lobe, series 3, image number 62. Upper Abdomen: Partially visualized right crural adenopathy measuring 11 mm. Partially visualized left adrenal gland nodularity. Musculoskeletal: Partially calcified nodule within the slightly lower inner right breast unchanged. No acute or suspicious bone lesion. IMPRESSION: 1. 2.6 cm cavitary lesion in the subpleural left lower lobe with surrounding ground-glass density. Additional left posterior subpleural foci of ground-glass density. Differential considerations include septic embolus, cavitary neoplasm or cavitary metastatic focus, and cavitary infectious process to include mycobacterial etiology and atypical entities especially immune compromised. 2. Bronchiectasis and multifocal stellate densities in the left upper lobe favor post treatment age. Emphysematous disease. 3. Mild right hilar adenopathy. Partially visualized right crural adenopathy and left adrenal gland nodularity. 4. 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   On: 03/12/2017 23:35   Dg Chest Port 1 View  Result Date: 03/12/2017 CLINICAL DATA:  Acute onset of generalized weakness and sepsis. Initial encounter. EXAM: PORTABLE CHEST 1 VIEW COMPARISON:  Chest radiograph performed 03/05/2017 FINDINGS: A 3.0 cm opacity is noted at the left midlung zone, with central lucency, concerning for central necrosis. Given interval  development since the recent prior study, this is most likely infectious in nature. Atypical infections such as Actinomyces or Nocardia could have such an appearance. Tuberculosis cannot be excluded. There is elevation of the left hemidiaphragm. No pleural effusion or pneumothorax is seen. IMPRESSION: Enlarging 3.0 cm opacity at the left midlung zone, with apparent central necrosis. Given interval development since the recent prior study, this is most likely infectious in nature. Atypical infections such as Actinomyces or Nocardia could have such an appearance. Tuberculosis cannot be excluded. These results were called by telephone at the time of interpretation on 03/12/2017 at 8:39 pm to the ER team, who verbally acknowledged these results. Electronically Signed   By: Garald Balding M.D.   On: 03/12/2017 20:44     ASSESSMENT AND PLAN:    56 year old female with history of metastatic poorly differentiated adenocarcinoma of lung followed by Dr Mike Gip here with weakness and fevers.   1. Sepsis: Patient presents with leukocytosis and tachycardia. Sepsis is presumed to be due to pneumonia. Due to CT scan showing possible cavitary lesion she is being evaluated for tuberculosis with AFBs. Follow up on AFB cultures  Follow-up on fungal and blood cultures  Follow-up urine histoplasma , crypto antigen and fungal serologies as per requested ID  Continue Unasyn  Consider bronchoscopy next week if laboratories are unrevealing.  Appreciate ID consult   2. Hypokalemia: Check labs for a.m. 3. COPD without exacerbation: Continue inhalers  4. GERD: Continue PPI  5. Essential hypertension: Continue metoprolol  6. History of metastatic poorly differentiated adenocarcinoma of lung: Follow-up on oncology consultation  7. Moderate/severe protein calorie malnutrition: Dietary consult appreciated Continue Megace and feeding supplement  8. Hyperthyroidism: Continue  Tapazole Thyroid function tests consistent  with hyperthyroidism  Management plans discussed with the patient and boyfriend and she is in agreement.  CODE STATUS: full  TOTAL TIME TAKING CARE OF THIS PATIENT: 24 minutes.     POSSIBLE D/C 3-5 days, DEPENDING ON CLINICAL CONDITION.   Marcell Pfeifer M.D on 03/14/2017 at 9:25 AM  Between 7am to 6pm - Pager - [phone] After 6pm go to [email or web address] - password [email or web address] [facility]  Office  [phone]  CC: Primary care physician; Donnie Coffin, MD  Note: This dictation was prepared with Dragon dictation along with smaller phrase technology. Any transcriptional errors that result from this process are unintentional.

## 2017-03-14 NOTE — Progress Notes (Signed)
Neb given to attempt sputum induction, after neb, patient unable to produce sputum.

## 2017-03-14 NOTE — Progress Notes (Signed)
Pt currently sleeping. Sitter at bedside. Will continue to monitor.

## 2017-03-14 NOTE — Progress Notes (Signed)
[phone] Lorelle Formosa, RN

## 2017-03-14 NOTE — Consult Note (Signed)
[facility]  Date of admission:  03/12/2017  Inpatient day:  03/14/2017  Consulting physician: Dr. 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 female with stage IV lung cancer who was admitted with sepsis and a cavitary lung lesion.  HPI:  The patient was admitted to [facility] from 02/06/2017 - 02/21/2017 with general weakness, dehydration, and failure to thrive.  She was noted to have severe hypothyroidsm and B1 deficiency.  She was supplemented.  Her metabolic encephalopathy improved dramatically.  She was discharged on thyroid and thiamine supplements.  She was admitted to [facility] from 03/02/2017 - 03/04/2017 with nausea, vomiting, and tachycardia.  TSH was 0.041 (compared to 107 on 02/11/2017).  Thyroid hormone held.  She has been living in the [facility].  Her daughter enlisted the assistance of Hospice.  She states that EMS was called by her daughter secondary to her cough and "being sick".  She denies any change from her baseline.  She denies any exposure to tuberculosis or sick individuals.  She presented to the ER on 03/12/2017 with fever, chills, weakness, and fatigue.  CXR revealed an enlarging 3 cm left midlung opacity with central necrosis.  Chest CT revealed a 2.6 cm cavitary lesion in the subpleural left lower lobe with surrounding ground-glass density. Additional left posterior subpleural foci of ground-glass density. Differential considerations include septic embolus, cavitary neoplasm or cavitary metastatic focus, and cavitary infectious process.  She has been seen by Dr. Ola Spurr.  Infectious differential is broad.  Blood cultures, AFB, MTB PCR, urine histoplasma, serum fungal serologies and cryptococcal antigen have been sent.  She is currently on Unasyn.  Consideration is being made for bronchoscopy if testing is unrevealing.  She has had no treatment  for her lung cancer since 10/31/2016.  PET scan on 01/29/2017 revealed abnormal small volume hypermetabolic adenopathy in the neck, chest, and upper abdomen with probable involvement of the left adrenal gland, suspicious for recurrent metastatic lung cancer. There was stable sub solid nodularity in the left upper lobe with a new partially solid 7 mm nodule in the left lower lobe.     Past Medical History:  Diagnosis Date  . COPD (chronic obstructive pulmonary disease) ([place])   . Dementia   . Hypertension   . Lung cancer ([place])   . Thyroid disease     Past Surgical History:  Procedure Laterality Date  . CESAREAN SECTION CLASSICAL    . STOMACH SURGERY     Pt reports for a tumor    Family History  Problem Relation Age of Onset  . Hypertension Father   . Cancer Father   . Diabetes Father   . Cancer Maternal Aunt     Social History:  reports that she has been smoking Cigarettes.  She has smoked for the past 15.00 years. She has never used smokeless tobacco. She reports that she drinks about 1.2 oz of alcohol per week . She reports that she does not use drugs.  She has been living at the [facility].  The patient is accompanied by her boyfriend, [name].  Allergies:  Allergies  Allergen Reactions  . No Known Allergies     Medications Prior to Admission  Medication Sig Dispense Refill  . ALPRAZolam (XANAX) 0.25 MG tablet Take 1 tablet (0.25 mg total) by mouth 3 (three) times daily as needed for anxiety. 30 tablet 0  . docusate sodium (COLACE) 100  MG capsule Take 100 mg by mouth 2 (two) times daily as needed for mild constipation.    . feeding supplement (BOOST / RESOURCE BREEZE) LIQD Take 1 Container by mouth 2 (two) times daily between meals. 90 Container 6  . folic acid (FOLVITE) 470 MCG tablet Take 800 mcg by mouth daily.     [name] ipratropium-albuterol (DUONEB) 0.5-2.5 (3) MG/3ML SOLN Take 3 mLs by nebulization every 4 (four) hours. 360 mL 5  . lidocaine (XYLOCAINE) 2 % solution Use  as directed 10 mLs in the mouth or throat every 4 (four) hours as needed for mouth pain.    . magnesium oxide (MAG-OX) 400 MG tablet Take 1 tablet (400 mg total) by mouth 2 (two) times daily. 30 tablet 0  . megestrol (MEGACE) 40 MG/ML suspension Take 10 mLs (400 mg total) by mouth 2 (two) times daily. 300 mL 1  . metoprolol (LOPRESSOR) 50 MG tablet Take 0.5 tablets (25 mg total) by mouth 2 (two) times daily. 30 tablet 0  . Morphine Sulfate (MORPHINE CONCENTRATE) 10 mg / 0.5 ml concentrated solution Take 0.5 mLs (10 mg total) by mouth every 4 (four) hours as needed for severe pain. 30 mL 0  . omeprazole (PRILOSEC) 20 MG capsule Take 1 capsule (20 mg total) by mouth daily. 30 capsule 3  . ondansetron (ZOFRAN) 4 MG tablet Take 1 tablet (4 mg total) by mouth every 6 (six) hours as needed for nausea. 20 tablet 0  . potassium chloride 20 MEQ TBCR Take 1 tab twice a day 60 tablet 0  . RA VITAMIN B-1 100 MG TABS Take 100 mg by mouth daily.  0  . senna (SENOKOT) 8.6 MG TABS tablet Take 1 tablet (8.6 mg total) by mouth daily. 120 each 0  . thiamine (VITAMIN B-1) 100 MG tablet Take 1 tablet (100 mg total) by mouth daily. 30 tablet 1  . tiotropium (SPIRIVA) 18 MCG inhalation capsule Place 1 capsule (18 mcg total) into inhaler and inhale daily. 30 capsule 1  . traZODone (DESYREL) 50 MG tablet Take 1 tablet (50 mg total) by mouth at bedtime as needed for sleep. 30 tablet 1  . dexamethasone (DECADRON) 4 MG tablet Take 82m BID the day before and day after chemotherapy. (Patient not taking: Reported on 03/02/2017) 20 tablet 0  . ondansetron (ZOFRAN) 8 MG tablet Take 1 tablet (8 mg total) by mouth 2 (two) times daily as needed for nausea or vomiting. (Patient not taking: Reported on 03/05/2017) 30 tablet 1  . Oxycodone HCl 10 MG TABS Take 1 tablet (10 mg total) by mouth every 4 (four) hours as needed (pain). (Patient not taking: Reported on 03/02/2017) 30 tablet 0    Review of Systems: GENERAL:  Feels "the same".  No  fevers.  Chills.  Weight down 2 pounds in past month. PERFORMANCE STATUS (ECOG):  2 HEENT:  No visual changes, runny nose, sore throat, mouth sores or tenderness. Lungs: No shortness of breath.  Chronic cough.  No hemoptysis. Cardiac:  No chest pain, palpitations, orthopnea, or PND. GI:  Appetite fair.  No nausea, vomiting, diarrhea, constipation, melena or hematochezia. GU:  No urgency, frequency, dysuria, or hematuria. Musculoskeletal:  Upper back and head discomfort (chronic).  No joint pain.  No muscle tenderness. Extremities:  No pain or swelling. Skin:  No rashes or skin changes. Neuro:  General weakness.  No headache, numbness or weakness, balance or coordination issues. Endocrine:  No diabetes.  Hyperthyroidism than hypothyroidism.  No hot flashes  or night sweats. Psych:  "Moody".  Nodepression or anxiety. Pain:  Mild chronic pain. Review of systems:  All other systems reviewed and found to be negative.  Physical Exam:  Blood pressure 109/66, pulse (!) 104, temperature 98.6 F (37 C), resp. rate 19, height _0  (1.499 m), weight 120 lb 6.4 oz (54.6 kg), last menstrual period 01/30/2008, SpO2 100 %.  GENERAL:  Chronically ill appearing woman sitting comfortably on the medical unit in no acute distress.  She is actively arguing with her boyfriend. MENTAL STATUS:  Alert and oriented to person and place. HEAD:  Brown hair.  Normocephalic, atraumatic, face symmetric, no Cushingoid features. EYES:  Brown eyes.  Pupils equal round and reactive to light and accomodation.  No conjunctivitis or scleral icterus. ENT:  Oropharynx clear without lesion.  Tongue normal.  Poor dentition.  Mucous membranes moist.  RESPIRATORY:  Scattered rhonchi.  No rales or wheezes. CARDIOVASCULAR:  Regular rate and rhythm without murmur, rub or gallop. ABDOMEN:  Soft, non-tender, with active bowel sounds, and no hepatosplenomegaly.  No masses. SKIN:  No rashes, ulcers or lesions. EXTREMITIES: Thin.  No edema,  no skin discoloration or tenderness.  No palpable cords. LYMPH NODES: No palpable cervical, supraclavicular, axillary or inguinal adenopathy  NEUROLOGICAL: Unremarkable. PSYCH:  Appropriate.   Results for orders placed or performed during the hospital encounter of 03/12/17 (from the past 48 hour(s))  Lactic acid, plasma     Status: Abnormal   Collection Time: 03/12/17  8:17 PM  Result Value Ref Range   Lactic Acid, Venous 2.5 (HH) 0.5 - 1.9 mmol/L    Comment: CRITICAL RESULT CALLED TO, READ BACK BY AND VERIFIED WITH JENNA ELLINGTON AT 2121 03/12/2017 BY TFK.   Troponin I     Status: None   Collection Time: 03/12/17  8:17 PM  Result Value Ref Range   Troponin I <0.03 <0.03 ng/mL  Comprehensive metabolic panel     Status: Abnormal   Collection Time: 03/12/17  8:17 PM  Result Value Ref Range   Sodium 135 135 - 145 mmol/L   Potassium 2.9 (L) 3.5 - 5.1 mmol/L   Chloride 97 (L) 101 - 111 mmol/L   CO2 26 22 - 32 mmol/L   Glucose, Bld 129 (H) 65 - 99 mg/dL   BUN 12 6 - 20 mg/dL   Creatinine, Ser 0.63 0.44 - 1.00 mg/dL   Calcium 10.2 8.9 - 10.3 mg/dL   Total Protein 8.6 (H) 6.5 - 8.1 g/dL   Albumin 2.8 (L) 3.5 - 5.0 g/dL   AST 24 15 - 41 U/L   ALT 9 (L) 14 - 54 U/L   Alkaline Phosphatase 67 38 - 126 U/L   Total Bilirubin 1.4 (H) 0.3 - 1.2 mg/dL   GFR calc non Af Amer >60 >60 mL/min   GFR calc Af Amer >60 >60 mL/min    Comment: (NOTE) The eGFR has been calculated using the CKD EPI equation. This calculation has not been validated in all clinical situations. eGFR's persistently <60 mL/min signify possible Chronic Kidney Disease.    Anion gap 12 5 - 15  Procalcitonin     Status: None   Collection Time: 03/12/17  8:17 PM  Result Value Ref Range   Procalcitonin 0.18 ng/mL    Comment:        Interpretation: PCT (Procalcitonin) <= 0.5 ng/mL: Systemic infection (sepsis) is not likely. Local bacterial infection is possible. (NOTE)         ICU PCT Algorithm  Non ICU  PCT Algorithm    ----------------------------     ------------------------------         PCT < 0.25 ng/mL                 PCT < 0.1 ng/mL     Stopping of antibiotics            Stopping of antibiotics       strongly encouraged.               strongly encouraged.    ----------------------------     ------------------------------       PCT level decrease by               PCT < 0.25 ng/mL       >= 80% from peak PCT       OR PCT 0.25 - 0.5 ng/mL          Stopping of antibiotics                                             encouraged.     Stopping of antibiotics           encouraged.    ----------------------------     ------------------------------       PCT level decrease by              PCT >= 0.25 ng/mL       < 80% from peak PCT        AND PCT >= 0.5 ng/mL            Continuin g antibiotics                                              encouraged.       Continuing antibiotics            encouraged.    ----------------------------     ------------------------------     PCT level increase compared          PCT > 0.5 ng/mL         with peak PCT AND          PCT >= 0.5 ng/mL             Escalation of antibiotics                                          strongly encouraged.      Escalation of antibiotics        strongly encouraged.   Blood gas, venous ([facility], [facility], [facility])     Status: Abnormal   Collection Time: 03/12/17  8:17 PM  Result Value Ref Range   pH, Ven 7.45 (H) 7.250 - 7.430   pCO2, Ven 34 (L) 44.0 - 60.0 mmHg   pO2, Ven 36.0 32.0 - 45.0 mmHg   Bicarbonate 23.6 20.0 - 28.0 mmol/L   Acid-Base Excess 0.0 0.0 - 2.0 mmol/L   O2 Saturation 72.3 %   Patient temperature 37.0    Collection site RIGHT ANTECUBITAL    Sample type VENOUS   Protime-INR     Status: Abnormal  Collection Time: 03/12/17  8:17 PM  Result Value Ref Range   Prothrombin Time 15.9 (H) 11.4 - 15.2 seconds   INR 1.26   CBC WITH DIFFERENTIAL     Status: Abnormal   Collection Time: 03/12/17  8:17 PM  Result Value  Ref Range   WBC 11.9 (H) 3.6 - 11.0 K/uL   RBC 3.79 (L) 3.80 - 5.20 MIL/uL   Hemoglobin 11.4 (L) 12.0 - 16.0 g/dL   HCT 34.4 (L) 35.0 - 47.0 %   MCV 90.9 80.0 - 100.0 fL   MCH 30.0 26.0 - 34.0 pg   MCHC 33.0 32.0 - 36.0 g/dL   RDW 13.9 11.5 - 14.5 %   Platelets 396 150 - 440 K/uL   Neutrophils Relative % 76 %   Neutro Abs 9.1 (H) 1.4 - 6.5 K/uL   Lymphocytes Relative 10 %   Lymphs Abs 1.1 1.0 - 3.6 K/uL   Monocytes Relative 13 %   Monocytes Absolute 1.5 (H) 0.2 - 0.9 K/uL   Eosinophils Relative 1 %   Eosinophils Absolute 0.1 0 - 0.7 K/uL   Basophils Relative 0 %   Basophils Absolute 0.0 0 - 0.1 K/uL  Rapid HIV screen (HIV 1/2 Ab+Ag)     Status: None   Collection Time: 03/12/17  8:17 PM  Result Value Ref Range   HIV-1 P24 Antigen - HIV24 NON REACTIVE NON REACTIVE   HIV 1/2 Antibodies NON REACTIVE NON REACTIVE   Interpretation (HIV Ag Ab)      A non reactive test result means that HIV 1 or HIV 2 antibodies and HIV 1 p24 antigen were not detected in the specimen.  Blood culture (routine x 2)     Status: None (Preliminary result)   Collection Time: 03/12/17 10:18 PM  Result Value Ref Range   Specimen Description BLOOD R HAND    Special Requests BOTTLES DRAWN AEROBIC AND ANAEROBIC BCAV    Culture NO GROWTH < 12 HOURS    Report Status PENDING   Blood culture (routine x 2)     Status: None (Preliminary result)   Collection Time: 03/12/17 10:18 PM  Result Value Ref Range   Specimen Description BLOOD L AC    Special Requests BOTTLES DRAWN AEROBIC AND ANAEROBIC BCAV    Culture NO GROWTH < 12 HOURS    Report Status PENDING   Influenza panel by PCR (type A & B)     Status: None   Collection Time: 03/12/17 11:16 PM  Result Value Ref Range   Influenza A By PCR NEGATIVE NEGATIVE   Influenza B By PCR NEGATIVE NEGATIVE    Comment: (NOTE) The Xpert Xpress Flu assay is intended as an aid in the diagnosis of  influenza and should not be used as a sole basis for treatment.  This  assay is  FDA approved for nasopharyngeal swab specimens only. Nasal  washings and aspirates are unacceptable for Xpert Xpress Flu testing.   Lactic acid, plasma     Status: None   Collection Time: 03/13/17 12:34 AM  Result Value Ref Range   Lactic Acid, Venous 1.1 0.5 - 1.9 mmol/L  Urinalysis, Routine w reflex microscopic     Status: Abnormal   Collection Time: 03/13/17 12:34 AM  Result Value Ref Range   Color, Urine AMBER (A) YELLOW    Comment: BIOCHEMICALS MAY BE AFFECTED BY COLOR   APPearance CLEAR (A) CLEAR   Specific Gravity, Urine 1.034 (H) 1.005 - 1.030   pH 6.0 5.0 -  8.0   Glucose, UA NEGATIVE NEGATIVE mg/dL   Hgb urine dipstick NEGATIVE NEGATIVE   Bilirubin Urine NEGATIVE NEGATIVE   Ketones, ur NEGATIVE NEGATIVE mg/dL   Protein, ur NEGATIVE NEGATIVE mg/dL   Nitrite NEGATIVE NEGATIVE   Leukocytes, UA NEGATIVE NEGATIVE  Basic metabolic panel     Status: Abnormal   Collection Time: 03/13/17  3:17 AM  Result Value Ref Range   Sodium 136 135 - 145 mmol/L   Potassium 3.3 (L) 3.5 - 5.1 mmol/L   Chloride 105 101 - 111 mmol/L   CO2 22 22 - 32 mmol/L   Glucose, Bld 89 65 - 99 mg/dL   BUN 8 6 - 20 mg/dL   Creatinine, Ser 0.40 (L) 0.44 - 1.00 mg/dL   Calcium 9.3 8.9 - 10.3 mg/dL   GFR calc non Af Amer >60 >60 mL/min   GFR calc Af Amer >60 >60 mL/min    Comment: (NOTE) The eGFR has been calculated using the CKD EPI equation. This calculation has not been validated in all clinical situations. eGFR's persistently <60 mL/min signify possible Chronic Kidney Disease.    Anion gap 9 5 - 15  CBC     Status: Abnormal   Collection Time: 03/13/17  3:17 AM  Result Value Ref Range   WBC 11.2 (H) 3.6 - 11.0 K/uL   RBC 2.99 (L) 3.80 - 5.20 MIL/uL   Hemoglobin 8.9 (L) 12.0 - 16.0 g/dL   HCT 27.4 (L) 35.0 - 47.0 %   MCV 91.6 80.0 - 100.0 fL   MCH 29.8 26.0 - 34.0 pg   MCHC 32.5 32.0 - 36.0 g/dL   RDW 13.7 11.5 - 14.5 %   Platelets 363 150 - 440 K/uL  TSH     Status: Abnormal   Collection  Time: 03/13/17  1:07 PM  Result Value Ref Range   TSH 0.013 (L) 0.350 - 4.500 uIU/mL    Comment: Performed by a 3rd Generation assay with a functional sensitivity of <=0.01 uIU/mL.  T4, free     Status: Abnormal   Collection Time: 03/13/17  1:07 PM  Result Value Ref Range   Free T4 2.34 (H) 0.61 - 1.12 ng/dL    Comment: (NOTE) Biotin ingestion may interfere with free T4 tests. If the results are inconsistent with the TSH level, previous test results, or the clinical presentation, then consider biotin interference. If needed, order repeat testing after stopping biotin.   T3 uptake     Status: Abnormal   Collection Time: 03/13/17  1:07 PM  Result Value Ref Range   T3 Uptake Ratio 41 (H) 24 - 39 %    Comment: (NOTE) Performed At: [facility] [place], [place] [id number] Lindon Romp MD PQ:[id number]   MRSA PCR Screening     Status: None   Collection Time: 03/13/17  4:58 PM  Result Value Ref Range   MRSA by PCR NEGATIVE NEGATIVE    Comment:        The GeneXpert MRSA Assay (FDA approved for NASAL specimens only), is one component of a comprehensive MRSA colonization surveillance program. It is not intended to diagnose MRSA infection nor to guide or monitor treatment for MRSA infections.   Creatinine, serum     Status: Abnormal   Collection Time: 03/14/17  4:22 AM  Result Value Ref Range   Creatinine, Ser 0.42 (L) 0.44 - 1.00 mg/dL   GFR calc non Af Amer >60 >60 mL/min   GFR calc Af Amer >  60 >60 mL/min    Comment: (NOTE) The eGFR has been calculated using the CKD EPI equation. This calculation has not been validated in all clinical situations. eGFR's persistently <60 mL/min signify possible Chronic Kidney Disease.    Ct Chest W Contrast  Result Date: 03/12/2017 CLINICAL DATA:  Generalized weakness tachycardia EXAM: CT CHEST WITH CONTRAST TECHNIQUE: Multidetector CT imaging of the chest was performed during intravenous contrast administration.  CONTRAST:  70m ISOVUE-300 IOPAMIDOL (ISOVUE-300) INJECTION 61% COMPARISON:  Chest x-ray 03/12/2017, PET-CT 01/29/2017, CT chest 07/08/2017 FINDINGS: Cardiovascular: Non aneurysmal aorta. Heart size normal. No large pericardial effusion. Mediastinum/Nodes: Thyroid within normal limits. Midline trachea. No gross enlarged mediastinal nodes. Right hilar adenopathy up to 11 mm. Esophagus within normal limits. Lungs/Pleura: Bronchiectasis and stellate densities in the left upper lobe are unchanged and most consistent with post treatment change. Interim finding of ground-glass density in the subpleural left lower lobe ; at the center of this is a cavitary lesion measuring 2.6 x 2.2 cm. Emphysematous disease is present. No pleural effusion. Additional subpleural focus of ground-glass density in the left lower lobe, series 3, image number 62. Upper Abdomen: Partially visualized right crural adenopathy measuring 11 mm. Partially visualized left adrenal gland nodularity. Musculoskeletal: Partially calcified nodule within the slightly lower inner right breast unchanged. No acute or suspicious bone lesion. IMPRESSION: 1. 2.6 cm cavitary lesion in the subpleural left lower lobe with surrounding ground-glass density. Additional left posterior subpleural foci of ground-glass density. Differential considerations include septic embolus, cavitary neoplasm or cavitary metastatic focus, and cavitary infectious process to include mycobacterial etiology and atypical entities especially immune compromised. 2. Bronchiectasis and multifocal stellate densities in the left upper lobe favor post treatment age. Emphysematous disease. 3. Mild right hilar adenopathy. Partially visualized right crural adenopathy and left adrenal gland nodularity. 4. Oval partially calcified right breast mass unchanged, probably a fibroadenoma Electronically Signed   By: KDonavan FoilM.D.   On: 03/12/2017 23:35   Dg Chest Port 1 View  Result Date:  03/12/2017 CLINICAL DATA:  Acute onset of generalized weakness and sepsis. Initial encounter. EXAM: PORTABLE CHEST 1 VIEW COMPARISON:  Chest radiograph performed 03/05/2017 FINDINGS: A 3.0 cm opacity is noted at the left midlung zone, with central lucency, concerning for central necrosis. Given interval development since the recent prior study, this is most likely infectious in nature. Atypical infections such as Actinomyces or Nocardia could have such an appearance. Tuberculosis cannot be excluded. There is elevation of the left hemidiaphragm. No pleural effusion or pneumothorax is seen. IMPRESSION: Enlarging 3.0 cm opacity at the left midlung zone, with apparent central necrosis. Given interval development since the recent prior study, this is most likely infectious in nature. Atypical infections such as Actinomyces or Nocardia could have such an appearance. Tuberculosis cannot be excluded. These results were called by telephone at the time of interpretation on 03/12/2017 at 8:39 pm to the ER team, who verbally acknowledged these results. Electronically Signed   By: JGarald BaldingM.D.   On: 03/12/2017 20:44    Assessment:  The patient is a 56y.o. woman with with metastatic poorly differentiated lung cancer.  She was diagnosed in 09/2014.  She received concurrent chemotherapy and radiation followed by maintenance [facility] (last 10/31/2016).  She has not received further chemotherapy secondary to missed appointments and hospitalizations.  PET scan on 01/29/2017 revealed abnormal small volume hypermetabolic adenopathy in the neck, chest, and upper abdomen with probable involvement of the left adrenal gland, suspicious for recurrent metastatic lung cancer.  There was stable sub solid nodularity in the left upper lobe with a new partially solid 7 mm nodule in the left lower lobe.  She declined cervical biopsy to confirm disease represented her lung cancer rather than a second malignancy (lymphoma).  She was  admitted to the hospital with fever, chills, weakness and a new cavitary lesion in the left lung.  Etiology is likely infectious given the rapid development and her systemic symptoms.  Plan:   1.  Oncology:  Patient has metastatic lung cancer.  PET scan in 01/2017 revealed low volume disease.  Recent outpatient request for Hospice.  Patient does not appear to be in agreement with Hospice services.  Will need to a family meeting with the patient, patient's daughter, and her medical power of attorney (her brother).  Patient still considering future treatment.   2.  Pulmonary:  New cavitary lesion in left lung.  Suspect infectious etiology given tempo of progression noted on imaging.  Studies pending.  Consideration for possible bronchoscopy (patient has been reluctant to have procedures in the past).  3.  Infectious disease:  Blood cultures, AFB, MTB PCR, urine histoplasma, serum fungal serologies and cryptococcal antigen have been sent.  She is currently on Unasyn.  4.  Endocrinology:  Patient with myxedema coma in 01/2017.  Severe hypothyroidism.  Will need endocrine follow-up as outpatient.  Thank you for allowing me to participate in Azie Mcconahy 's care.  I will follow her closely with you while hospitalized and after discharge in the outpatient department.   Lequita Asal, MD  03/14/2017, 5:45 AM

## 2017-03-14 NOTE — Progress Notes (Signed)
Pt becoming increasingly agitated, swearing, refusing to stay in room. Pt family contacted, they are unable to remain at bedside at this time. Tele sitter discontinued, is causing increased confusion to patient, she thinks voices are coming from the ceiling and people are out to get her. Requests nursing staff to call police because someone is trying to kill her, states she is being held against her will. Security paged to assist with situation, however patient became increasingly agitated with more people in room. 1:1 sitter at bedside, able to redirect patient. Will continue to monitor. Pt's family member may come in overnight to assist as needed.

## 2017-03-15 DIAGNOSIS — R531 Weakness: Secondary | ICD-10-CM

## 2017-03-15 DIAGNOSIS — R630 Anorexia: Secondary | ICD-10-CM

## 2017-03-15 LAB — BASIC METABOLIC PANEL
ANION GAP: 8 (ref 5–15)
CALCIUM: 9.4 mg/dL (ref 8.9–10.3)
CO2: 24 mmol/L (ref 22–32)
CREATININE: 0.46 mg/dL (ref 0.44–1.00)
Chloride: 101 mmol/L (ref 101–111)
GFR calc Af Amer: >60 mL/min (ref >60)
GLUCOSE: 78 mg/dL (ref 65–99)
Potassium: 3.1 mmol/L — ABNORMAL LOW (ref 3.5–5.1)
Sodium: 133 mmol/L — ABNORMAL LOW (ref 135–145)

## 2017-03-15 LAB — CBC
HCT: 26.2 % — ABNORMAL LOW (ref 35.0–47.0)
HEMOGLOBIN: 8.7 g/dL — AB (ref 12.0–16.0)
MCH: 30.1 pg (ref 26.0–34.0)
MCHC: 33.1 g/dL (ref 32.0–36.0)
MCV: 91 fL (ref 80.0–100.0)
PLATELETS: 391 10*3/uL (ref 150–440)
RBC: 2.88 MIL/uL — ABNORMAL LOW (ref 3.80–5.20)
RDW: 13.9 % (ref 11.5–14.5)
WBC: 7.5 10*3/uL (ref 3.6–11.0)

## 2017-03-15 LAB — ACID FAST SMEAR (AFB, MYCOBACTERIA): Acid Fast Smear: NEGATIVE

## 2017-03-15 LAB — ACID FAST SMEAR (AFB)

## 2017-03-15 NOTE — Progress Notes (Signed)
[place] [facility] [employer] NAME: Audrey Mcgee    MR#:  [id number]  DATE OF BIRTH:  May 30, 1961  SUBJECTIVE:  Patient wants to go home  Has sitter at night   REVIEW OF SYSTEMS:    Review of Systems  Constitutional: Positive for malaise/fatigue and weight loss. Negative for chills and fever.  HENT: Negative.  Negative for ear discharge, ear pain, hearing loss, nosebleeds and sore throat.   Eyes: Negative.  Negative for blurred vision and pain.  Respiratory: Positive for cough and shortness of breath. Negative for hemoptysis and wheezing.   Cardiovascular: Negative.  Negative for chest pain, palpitations and leg swelling.  Gastrointestinal: Negative.  Negative for abdominal pain, blood in stool, diarrhea, nausea and vomiting.  Genitourinary: Negative.  Negative for dysuria.  Musculoskeletal: Negative.  Negative for back pain.  Skin: Negative.   Neurological: Positive for weakness. Negative for dizziness, tremors, speech change, focal weakness, seizures and headaches.  Endo/Heme/Allergies: Negative.  Does not bruise/bleed easily.  Psychiatric/Behavioral: Negative.  Negative for depression, hallucinations and suicidal ideas.    Tolerating Diet:yes      DRUG ALLERGIES:   Allergies  Allergen Reactions  . No Known Allergies     VITALS:  Blood pressure (!) 150/88, pulse (!) 114, temperature 98.1 F (36.7 C), temperature source Oral, resp. rate 17, height '4\' 11"'$  (1.499 m), weight 54.6 kg (120 lb 6.4 oz), last menstrual period 01/30/2008, SpO2 100 %.  PHYSICAL EXAMINATION:   Physical Exam  Constitutional: She is oriented to person, place, and time. No distress.  Frail thin  HENT:  Head: Normocephalic.  Eyes: No scleral icterus.  Neck: Normal range of motion. Neck supple. No JVD present. No tracheal deviation present.  Cardiovascular: Normal rate, regular rhythm and normal heart sounds.  Exam reveals no gallop and no friction rub.   No  murmur heard. Pulmonary/Chest: Effort normal and breath sounds normal. No respiratory distress. She has no wheezes. She has no rales. She exhibits no tenderness.  Abdominal: Soft. Bowel sounds are normal. She exhibits no distension and no mass. There is no tenderness. There is no rebound and no guarding.  Musculoskeletal: Normal range of motion. She exhibits no edema.  Neurological: She is alert and oriented to person, place, and time.  Skin: Skin is warm. No rash noted. No erythema.  Psychiatric: Affect and judgment normal.      LABORATORY PANEL:   CBC  Recent Labs Lab 03/15/17 0335  WBC 7.5  HGB 8.7*  HCT 26.2*  PLT 391   ------------------------------------------------------------------------------------------------------------------  Chemistries   Recent Labs Lab 03/12/17 2017  03/15/17 0335  NA 135  < > 133*  K 2.9*  < > 3.1*  CL 97*  < > 101  CO2 26  < > 24  GLUCOSE 129*  < > 78  BUN 12  < > <5*  CREATININE 0.63  < > 0.46  CALCIUM 10.2  < > 9.4  AST 24  --   --   ALT 9*  --   --   ALKPHOS 67  --   --   BILITOT 1.4*  --   --   < > = values in this interval not displayed. ------------------------------------------------------------------------------------------------------------------  Cardiac Enzymes  Recent Labs Lab 03/12/17 2017  TROPONINI <0.03   ------------------------------------------------------------------------------------------------------------------  RADIOLOGY:  No results found.   ASSESSMENT AND PLAN:    56 year old female with history of metastatic poorly differentiated adenocarcinoma of lung followed by Dr Mike Gip here  with weakness and fevers.   1. Sepsis: Patient presents with leukocytosis and tachycardia. Sepsis is presumed to be due to pneumonia. Due to CT scan showing possible cavitary lesion she is being evaluated for tuberculosis with AFBs. Follow up on AFB cultures  Follow-up on fungal and blood cultures  Follow-up  urine histoplasma , crypto antigen and fungal serologies as per requested ID  Continue Unasyn  Consider bronchoscopy next week if laboratories are unrevealing.  Appreciate ID consult   2. Hypokalemia: replete and recheck in am  3. COPD without exacerbation: Continue inhalers  4. GERD: Continue PPI  5. Essential hypertension: Continue metoprolol  6. History of metastatic poorly differentiated adenocarcinoma of lung: appreciate ONCOLOGY eval Consider out of care consult. Patient was referred to hospice at one point   7. Moderate/severe protein calorie malnutrition: Dietary consult appreciated Continue Megace and feeding supplement  8. Hyperthyroidism: Continue Tapazole Thyroid function tests consistent with hyperthyroidism  Management plans discussed with the patient and boyfriend and she is in agreement.  CODE STATUS: full  TOTAL TIME TAKING CARE OF THIS PATIENT: 23 minutes.     POSSIBLE D/C 3-5 days, DEPENDING ON CLINICAL CONDITION.   Mica Releford M.D on 03/15/2017 at 9:53 AM  Between 7am to 6pm - Pager - [phone] After 6pm go to [email or web address] - password [email or web address] [facility]  Office  [phone]  CC: Primary care physician; Donnie Coffin, MD  Note: This dictation was prepared with Dragon dictation along with smaller phrase technology. Any transcriptional errors that result from this process are unintentional.

## 2017-03-15 NOTE — Progress Notes (Signed)
[facility] Hematology/Oncology Progress Note  Date of admission: 03/12/2017  Hospital day:  03/15/2017  Chief Complaint: Audrey Mcgee is a 56 y.o. female with stage IV lung cancer who was admitted with sepsis and a cavitary lung lesion.  Subjective:  Tired.  Denies any shortness of breath or change in cough.  No hemoptysis.  Appetite is poor.  Social History: The patient is accompanied by a Actuary and later by her boyfriend, [name], today.  Allergies:  Allergies  Allergen Reactions  . No Known Allergies     Scheduled Medications: . ampicillin-sulbactam (UNASYN) IV  3 g Intravenous Q6H  . enoxaparin (LOVENOX) injection  40 mg Subcutaneous Q24H  . feeding supplement (ENSURE ENLIVE)  237 mL Oral BID BM  . folic acid  1 mg Oral Daily  . Influenza vac split quadrivalent PF  0.5 mL Intramuscular Tomorrow-1000  . ipratropium-albuterol  3 mL Nebulization Q6H  . mouth rinse  15 mL Mouth Rinse BID  . megestrol  400 mg Oral BID  . methimazole  5 mg Oral Daily  . metoprolol  25 mg Oral BID  . pantoprazole  40 mg Oral Daily  . pneumococcal 23 valent vaccine  0.5 mL Intramuscular Tomorrow-1000  . thiamine  100 mg Oral Daily  . tiotropium  18 mcg Inhalation Daily    Review of Systems: GENERAL:  Feels tired.  No fevers.  Chills.  Weight down 2 pounds in past month. PERFORMANCE STATUS (ECOG):  2 HEENT:  No visual changes, runny nose, sore throat, mouth sores or tenderness. Lungs: No shortness of breath.  Chronic cough.  No hemoptysis. Cardiac:  No chest pain, palpitations, orthopnea, or PND. GI:  Appetite poor.  No nausea, vomiting, diarrhea, constipation, melena or hematochezia. GU:  No urgency, frequency, dysuria, or hematuria. Musculoskeletal:  Upper back and head discomfort (chronic).  No joint pain.  No muscle tenderness. Extremities:  No pain or swelling. Skin:  No rashes or skin changes. Neuro:  General weakness.  No headache, numbness or weakness,  balance or coordination issues. Endocrine:  No diabetes.  Hyperthyroidism than hypothyroidism.  No hot flashes or night sweats. Psych:  "Moody".  No depression or anxiety. Pain:  Mild chronic pain. Review of systems:  All other systems reviewed and found to be negative.  Physical Exam: Blood pressure (!) 150/88, pulse (!) 114, temperature 98.1 F (36.7 C), temperature source Oral, resp. rate 17, height 4' 11"  (1.499 m), weight 120 lb 6.4 oz (54.6 kg), last menstrual period 01/30/2008, SpO2 100 %.  GENERAL:  Chronically ill appearing woman lying comfortably on the medical unit in no acute distress.   MENTAL STATUS:  Alert and oriented to person and place. HEAD:  Brown hair.  Normocephalic, atraumatic, face symmetric, no Cushingoid features. EYES:  Brown eyes.  Pupils equal round and reactive to light and accomodation.  No conjunctivitis or scleral icterus. ENT:  Oropharynx clear without lesion.  Tongue normal.  Poor dentition.  Mucous membranes moist.  RESPIRATORY:  No rales, wheezes or rhonchi. CARDIOVASCULAR:  Regular rate and rhythm without murmur, rub or gallop. ABDOMEN:  Soft, non-tender, with active bowel sounds, and no hepatosplenomegaly.  No masses. SKIN:  No rashes, ulcers or lesions. EXTREMITIES: Thin.  No edema, no skin discoloration or tenderness.  No palpable cords. LYMPH NODES: No palpable cervical, supraclavicular, axillary or inguinal adenopathy  NEUROLOGICAL: Unremarkable. PSYCH:  Appropriate.   Results for orders placed or performed during the hospital encounter of 03/12/17 (from the past 48  hour(s))  Cryptococcus Antigen, Serum     Status: None   Collection Time: 03/13/17  3:16 PM  Result Value Ref Range   Cryptococcus Antigen, Serum Negative Negative    Comment: (NOTE) Performed At: [facility] [place], [place] [id number] Lindon Romp MD LK:[id number]   MRSA PCR Screening     Status: None   Collection Time: 03/13/17  4:58 PM  Result  Value Ref Range   MRSA by PCR NEGATIVE NEGATIVE    Comment:        The GeneXpert MRSA Assay (FDA approved for NASAL specimens only), is one component of a comprehensive MRSA colonization surveillance program. It is not intended to diagnose MRSA infection nor to guide or monitor treatment for MRSA infections.   Creatinine, serum     Status: Abnormal   Collection Time: 03/14/17  4:22 AM  Result Value Ref Range   Creatinine, Ser 0.42 (L) 0.44 - 1.00 mg/dL   GFR calc non Af Amer >60 >60 mL/min   GFR calc Af Amer >60 >60 mL/min    Comment: (NOTE) The eGFR has been calculated using the CKD EPI equation. This calculation has not been validated in all clinical situations. eGFR's persistently <60 mL/min signify possible Chronic Kidney Disease.   Basic metabolic panel     Status: Abnormal   Collection Time: 03/15/17  3:35 AM  Result Value Ref Range   Sodium 133 (L) 135 - 145 mmol/L   Potassium 3.1 (L) 3.5 - 5.1 mmol/L   Chloride 101 101 - 111 mmol/L   CO2 24 22 - 32 mmol/L   Glucose, Bld 78 65 - 99 mg/dL   BUN <5 (L) 6 - 20 mg/dL   Creatinine, Ser 0.46 0.44 - 1.00 mg/dL   Calcium 9.4 8.9 - 10.3 mg/dL   GFR calc non Af Amer >60 >60 mL/min   GFR calc Af Amer >60 >60 mL/min    Comment: (NOTE) The eGFR has been calculated using the CKD EPI equation. This calculation has not been validated in all clinical situations. eGFR's persistently <60 mL/min signify possible Chronic Kidney Disease.    Anion gap 8 5 - 15  CBC     Status: Abnormal   Collection Time: 03/15/17  3:35 AM  Result Value Ref Range   WBC 7.5 3.6 - 11.0 K/uL   RBC 2.88 (L) 3.80 - 5.20 MIL/uL   Hemoglobin 8.7 (L) 12.0 - 16.0 g/dL   HCT 26.2 (L) 35.0 - 47.0 %   MCV 91.0 80.0 - 100.0 fL   MCH 30.1 26.0 - 34.0 pg   MCHC 33.1 32.0 - 36.0 g/dL   RDW 13.9 11.5 - 14.5 %   Platelets 391 150 - 440 K/uL   No results found.  Assessment:  Audrey Mcgee is a 56 y.o. female with metastatic poorly differentiated lung  cancer. She was diagnosed in 09/2014. She received concurrent chemotherapy and radiation followed by maintenance [facility] (last 10/31/2016). She has not received further chemotherapy secondary to missed appointments and hospitalizations.  PET scanon 01/29/2017 revealed abnormal small volumehypermetabolic adenopathyin the neck, chest, and upper abdomen with probable involvement of the left adrenal gland, suspicious for recurrent metastatic lung cancer. There was stable sub solid nodularity in the left upper lobe with a new partially solid 7 mm nodule in the left lower lobe. She declined cervical biopsy to confirm disease represented her lung cancer rather than a second malignancy (lymphoma).  She was admitted to the hospital with fever,  chills, weakness and a new cavitary lesion in the left lung.  Etiology is likely infectious given the rapid development and her systemic symptoms.  Plan:   1.  Oncology:  Patient has metastatic lung cancer.  PET scan in 01/2017 revealed low volume disease.  Recent outpatient request for Hospice.  Patient adamant today that she does NOT want Hospice.  She wishes to continue chemotherapy when she is discharged.  We will need to a family meeting with the patient, patient's daughter, and her medical power of attorney (her brother).    2.  Pulmonary:  New cavitary lesion in left lung.  Suspect infectious etiology given tempo of progression noted on imaging.  Multiple studies pending.  Consideration for possible bronchoscopy (patient has been reluctant to have procedures in the past).  3.  Infectious disease:  Blood cultures, AFB, MTB PCR, urine histoplasma, serum fungal serologies and cryptococcal antigen are pending.  She is currently on Unasyn.  4.  Endocrinology:  Patient with myxedema coma in 01/2017.  Severe hypothyroidism.  Will need endocrine follow-up as outpatient.   Lequita Asal, MD  03/15/2017, 2:23 PM

## 2017-03-15 NOTE — Progress Notes (Signed)
svn given. No wheezing or rales noted. Lungs clear. Saturation on room air 100% RR 24. Tolerated svn treatment well

## 2017-03-15 NOTE — Progress Notes (Signed)
Patient feels short of breath. Oxygen 100 % on room air, HR 112. Respiratory called for a breathing treatment.

## 2017-03-15 NOTE — Progress Notes (Signed)
No IV access. This nurse attempted, [facility] attempted, no success. Dr Benjie Karvonen paged, waiting on callback.

## 2017-03-15 NOTE — Progress Notes (Signed)
Pt is alert and oriented to self mostly. She refused to take  Her medication. Her IV is out, needs to be restarted but pt refuses. Pt thinks we are trying to kill her

## 2017-03-16 DIAGNOSIS — J984 Other disorders of lung: Secondary | ICD-10-CM

## 2017-03-16 MED ORDER — POTASSIUM CHLORIDE CRYS ER 20 MEQ PO TBCR
20.0000 meq | EXTENDED_RELEASE_TABLET | Freq: Two times a day (BID) | ORAL | Status: DC
  Administered 2017-03-16 – 2017-03-18 (×4): 20 meq via ORAL
  Filled 2017-03-16 (×5): qty 1

## 2017-03-16 NOTE — Plan of Care (Signed)
Problem: Pain Management: Goal: Expressions of feelings of enhanced comfort will increase Outcome: Progressing Pt is able to relate some aspects of her plan of care to this writer but overall remains confused. Test results remain pending, pt continues with 1:1 coverage and reports improved appetite.

## 2017-03-16 NOTE — Progress Notes (Signed)
Strong non productive cough post nebulizer treatment. Pt states unable to bring up anything today even after nebulizer treatments.

## 2017-03-16 NOTE — Consult Note (Signed)
Pharmacy Antibiotic Note  Audrey Mcgee is a 56 y.o. female admitted on 03/12/2017 with aspiration PNA.  Pharmacy consulted for unasyn dosing.  Plan: unasyn 3g q 6 hours   Height: '4\' 11"'$  (149.9 cm) Weight: 120 lb 6.4 oz (54.6 kg) IBW/kg (Calculated) : 43.2  Temp (24hrs), Avg:98 F (36.7 C), Min:97.9 F (36.6 C), Max:98.1 F (36.7 C)   Recent Labs Lab 03/12/17 2017 03/13/17 0034 03/13/17 0317 03/14/17 0422 03/15/17 0335  WBC 11.9*  --  11.2*  --  7.5  CREATININE 0.63  --  0.40* 0.42* 0.46  LATICACIDVEN 2.5* 1.1  --   --   --     Estimated Creatinine Clearance: 60.7 mL/min (by C-G formula based on SCr of 0.46 mg/dL).    Allergies  Allergen Reactions  . No Known Allergies     Antimicrobials this admission: vancomycin 3/22 >> 3/23 Cefepime 3/22 x1 >> 3/23 unasyn 3/23>>>  Dose adjustments this admission:   Microbiology results: Recent Results (from the past 240 hour(s))  Blood culture (routine x 2)     Status: None (Preliminary result)   Collection Time: 03/12/17 10:18 PM  Result Value Ref Range Status   Specimen Description BLOOD R HAND  Final   Special Requests BOTTLES DRAWN AEROBIC AND ANAEROBIC BCAV  Final   Culture NO GROWTH 4 DAYS  Final   Report Status PENDING  Incomplete  Blood culture (routine x 2)     Status: None (Preliminary result)   Collection Time: 03/12/17 10:18 PM  Result Value Ref Range Status   Specimen Description BLOOD L AC  Final   Special Requests BOTTLES DRAWN AEROBIC AND ANAEROBIC BCAV  Final   Culture NO GROWTH 4 DAYS  Final   Report Status PENDING  Incomplete  Acid Fast Smear (AFB)     Status: None   Collection Time: 03/12/17 11:16 PM  Result Value Ref Range Status   AFB Specimen Processing Concentration  Final   Acid Fast Smear Negative  Final    Comment: (NOTE) Performed At: [facility] [place], [place] [id number] Lindon Romp MD [id number]    Source (AFB) SPU  Final  MRSA PCR  Screening     Status: None   Collection Time: 03/13/17  4:58 PM  Result Value Ref Range Status   MRSA by PCR NEGATIVE NEGATIVE Final    Comment:        The GeneXpert MRSA Assay (FDA approved for NASAL specimens only), is one component of a comprehensive MRSA colonization surveillance program. It is not intended to diagnose MRSA infection nor to guide or monitor treatment for MRSA infections.      Thank you for allowing pharmacy to be a part of this patient's care.  Larene Beach, PharmD  Clinical Pharmacist  03/16/2017 8:23 AM

## 2017-03-16 NOTE — Progress Notes (Signed)
[facility] INFECTIOUS DISE[facility] PROGRESS NOTE Date of Admission:  03/12/2017     ID: Audrey Mcgee is a 56 y.o. female with cavitary lung lesion  Principal Problem:   Sepsis ([place]) Active Problems:   Adenocarcinoma, lung (HCC)   Hypothyroidism   Hypokalemia   COPD (chronic obstructive pulmonary disease) ([place])   HCAP (healthcare-associated pneumonia)   Subjective: No fevers since admit, still with cough but not very productive. Brother at bedside  ROS  Eleven systems are reviewed and negative except per hpi  Medications:  Antibiotics Given (last 72 hours)    Date/Time Action Medication Dose Rate   03/13/17 1623 Given   Ampicillin-Sulbactam (UNASYN) 3 g in sodium chloride 0.9 % 100 mL IVPB 3 g 200 mL/hr   03/14/17 0010 Given   Ampicillin-Sulbactam (UNASYN) 3 g in sodium chloride 0.9 % 100 mL IVPB 3 g 200 mL/hr   03/14/17 0532 Given   Ampicillin-Sulbactam (UNASYN) 3 g in sodium chloride 0.9 % 100 mL IVPB 3 g 200 mL/hr   03/14/17 1222 Given   Ampicillin-Sulbactam (UNASYN) 3 g in sodium chloride 0.9 % 100 mL IVPB 3 g 200 mL/hr   03/14/17 1842 Given   Ampicillin-Sulbactam (UNASYN) 3 g in sodium chloride 0.9 % 100 mL IVPB 3 g 200 mL/hr   03/14/17 2331 Given   Ampicillin-Sulbactam (UNASYN) 3 g in sodium chloride 0.9 % 100 mL IVPB 3 g 200 mL/hr   03/15/17 1455 Given   Ampicillin-Sulbactam (UNASYN) 3 g in sodium chloride 0.9 % 100 mL IVPB 3 g 200 mL/hr   03/15/17 2128 Given   Ampicillin-Sulbactam (UNASYN) 3 g in sodium chloride 0.9 % 100 mL IVPB 3 g 200 mL/hr   03/16/17 0403 Given   Ampicillin-Sulbactam (UNASYN) 3 g in sodium chloride 0.9 % 100 mL IVPB 3 g 200 mL/hr   03/16/17 0729 Given   Ampicillin-Sulbactam (UNASYN) 3 g in sodium chloride 0.9 % 100 mL IVPB 3 g 200 mL/hr   03/16/17 1458 Given   Ampicillin-Sulbactam (UNASYN) 3 g in sodium chloride 0.9 % 100 mL IVPB 3 g 200 mL/hr     . ampicillin-sulbactam (UNASYN) IV  3 g Intravenous Q6H  . enoxaparin (LOVENOX)  injection  40 mg Subcutaneous Q24H  . feeding supplement (ENSURE ENLIVE)  237 mL Oral BID BM  . folic acid  1 mg Oral Daily  . Influenza vac split quadrivalent PF  0.5 mL Intramuscular Tomorrow-1000  . ipratropium-albuterol  3 mL Nebulization Q6H  . mouth rinse  15 mL Mouth Rinse BID  . megestrol  400 mg Oral BID  . methimazole  5 mg Oral Daily  . metoprolol  25 mg Oral BID  . pantoprazole  40 mg Oral Daily  . pneumococcal 23 valent vaccine  0.5 mL Intramuscular Tomorrow-1000  . potassium chloride  20 mEq Oral BID  . thiamine  100 mg Oral Daily  . tiotropium  18 mcg Inhalation Daily    Objective: Vital signs in last 24 hours: Temp:  [98 F (36.7 C)-98.1 F (36.7 C)] 98 F (36.7 C) (03/26 0730) Pulse Rate:  [104-123] 119 (03/26 0730) Resp:  [18-23] 18 (03/26 0730) BP: (115-136)/(69-91) 115/91 (03/26 0730) SpO2:  [100 %] 100 % (03/26 0730) Constitutional:  Awake and interactive but slowed mentation. Thin.  HENT: [place]/AT, PERRLA, no scleral icterus Mouth/Throat: Oropharynx is clear and dry, poor dentition. No oropharyngeal exudate.  Cardiovascular: Normal rate, regular rhythm and normal heart sounds.  Pulmonary/Chest: poor air movement Neck = supple, no nuchal  rigidity Abdominal: Soft. Bowel sounds are normal.  exhibits no distension. There is no tenderness.  Lymphadenopathy: no cervical adenopathy. No axillary adenopathy Neurological: alert and oriented to person, place, and time.  Skin: Skin is warm and dry. No rash noted. No erythema.  Ext no edema, does have clubbing Psychiatric: slowed mentation,  Flat affect  Lab Results  Recent Labs  03/14/17 0422 03/15/17 0335  WBC  --  7.5  HGB  --  8.7*  HCT  --  26.2*  NA  --  133*  K  --  3.1*  CL  --  101  CO2  --  24  BUN  --  <5*  CREATININE 0.42* 0.46    Microbiology: Results for orders placed or performed during the hospital encounter of 03/12/17  Blood culture (routine x 2)     Status: None (Preliminary result)    Collection Time: 03/12/17 10:18 PM  Result Value Ref Range Status   Specimen Description BLOOD R HAND  Final   Special Requests BOTTLES DRAWN AEROBIC AND ANAEROBIC BCAV  Final   Culture NO GROWTH 4 DAYS  Final   Report Status PENDING  Incomplete  Blood culture (routine x 2)     Status: None (Preliminary result)   Collection Time: 03/12/17 10:18 PM  Result Value Ref Range Status   Specimen Description BLOOD L AC  Final   Special Requests BOTTLES DRAWN AEROBIC AND ANAEROBIC BCAV  Final   Culture NO GROWTH 4 DAYS  Final   Report Status PENDING  Incomplete  Acid Fast Smear (AFB)     Status: None   Collection Time: 03/12/17 11:16 PM  Result Value Ref Range Status   AFB Specimen Processing Concentration  Final   Acid Fast Smear Negative  Final    Comment: (NOTE) Performed At: [facility] [place], [place] [id number] Lindon Romp MD OV:[id number]    Source (AFB) SPU  Final  MRSA PCR Screening     Status: None   Collection Time: 03/13/17  4:58 PM  Result Value Ref Range Status   MRSA by PCR NEGATIVE NEGATIVE Final    Comment:        The GeneXpert MRSA Assay (FDA approved for NASAL specimens only), is one component of a comprehensive MRSA colonization surveillance program. It is not intended to diagnose MRSA infection nor to guide or monitor treatment for MRSA infections.     Studies/Results:  Dg Chest 1 View  Result Date: 03/05/2017 CLINICAL DATA:  Stage IV lung malignancy. Bitten by something in the mid back today with subsequent syncopal episode striking the back of the head. Recently discharged hospital with hypertension and tachycardia. Current smoker. EXAM: CHEST 1 VIEW COMPARISON:  Portable chest x-ray of March 02, 2017 FINDINGS: The lungs are adequately inflated. The left hemidiaphragm is chronically elevated. There is subtle increased density peripherally in the left mid lung. The heart and pulmonary vascularity are normal. There is  calcification in the wall of the aortic arch. The mediastinum is normal in width. There is no pleural effusion. The observed bony thorax exhibits no acute abnormality. IMPRESSION: Hazy increased density peripherally in the left mid thorax. This does not appear to be secondary to overlap of normal structures. No abnormal uptake was noted here on the January 29, 2017 PET CT study. This could reflect a pulmonary contusion or a focus of subsegmental atelectasis or early pneumonia. Malignancy is felt less likely. Followup PA and lateral chest X-ray is recommended in  3-4 weeks following trial of antibiotic therapy to ensure resolution and exclude underlying malignancy. Thoracic aortic atherosclerosis. Electronically Signed   By: Uel Davidow  [place] M.D.   On: 03/05/2017 15:41   Dg Chest 1 View  Result Date: 03/02/2017 CLINICAL DATA:  56 year old female with shortness of breath. History of COPD, and lung cancer. EXAM: CHEST 1 VIEW COMPARISON:  Chest radiograph dated 02/06/2017 and CT dated 07/08/2016 FINDINGS: There is stable mild elevation of the left hemidiaphragm. The lungs are clear. There is no pleural effusion or pneumothorax. The cardiac silhouette is within normal limits. No acute osseous pathology identified. IMPRESSION: No active disease.  No interval change. Electronically Signed   By: Anner Crete M.D.   On: 03/02/2017 20:14   Ct Head Wo Contrast  Result Date: 03/05/2017 CLINICAL DATA:  Syncope with short-term memory loss. History of lung carcinoma EXAM: CT HEAD WITHOUT CONTRAST TECHNIQUE: Contiguous axial images were obtained from the base of the skull through the vertex without intravenous contrast. COMPARISON:  Head CT February 06, 2017 and brain MRI February 10, 2017 FINDINGS: Brain: Mild diffuse atrophy is stable. There is no intracranial mass, hemorrhage, extra-axial fluid collection, or midline shift. There is mild patchy small vessel disease in the centra semiovale bilaterally. Elsewhere  gray-white compartments appear normal. No acute infarct is demonstrable. Vascular: There is no hyperdense vessel. There is calcification in each carotid siphon region. Skull: Bony calvarium appears intact. There are no appreciable blastic or lytic bone lesions. Sinuses/Orbits: Visualized paranasal sinuses are clear. Visualized orbits appear symmetric bilaterally. Other: There is stable opacification and several inferior posterior mastoid air cells. Other visualized mastoid air cells are clear. There is debris in the left external auditory canal, unchanged from prior study. IMPRESSION: Atrophy with mild periventricular small vessel disease. No intracranial mass, hemorrhage, or extra-axial fluid collection. Carotid siphon calcification noted. Chronic appearing mastoid disease on the left inferiorly. Electronically Signed   By: Lowella Grip III M.D.   On: 03/05/2017 15:30   Ct Chest W Contrast  Result Date: 03/12/2017 CLINICAL DATA:  Generalized weakness tachycardia EXAM: CT CHEST WITH CONTRAST TECHNIQUE: Multidetector CT imaging of the chest was performed during intravenous contrast administration. CONTRAST:  74m ISOVUE-300 IOPAMIDOL (ISOVUE-300) INJECTION 61% COMPARISON:  Chest x-ray 03/12/2017, PET-CT 01/29/2017, CT chest 07/08/2017 FINDINGS: Cardiovascular: Non aneurysmal aorta. Heart size normal. No large pericardial effusion. Mediastinum/Nodes: Thyroid within normal limits. Midline trachea. No gross enlarged mediastinal nodes. Right hilar adenopathy up to 11 mm. Esophagus within normal limits. Lungs/Pleura: Bronchiectasis and stellate densities in the left upper lobe are unchanged and most consistent with post treatment change. Interim finding of ground-glass density in the subpleural left lower lobe ; at the center of this is a cavitary lesion measuring 2.6 x 2.2 cm. Emphysematous disease is present. No pleural effusion. Additional subpleural focus of ground-glass density in the left lower lobe, series  3, image number 62. Upper Abdomen: Partially visualized right crural adenopathy measuring 11 mm. Partially visualized left adrenal gland nodularity. Musculoskeletal: Partially calcified nodule within the slightly lower inner right breast unchanged. No acute or suspicious bone lesion. IMPRESSION: 1. 2.6 cm cavitary lesion in the subpleural left lower lobe with surrounding ground-glass density. Additional left posterior subpleural foci of ground-glass density. Differential considerations include septic embolus, cavitary neoplasm or cavitary metastatic focus, and cavitary infectious process to include mycobacterial etiology and atypical entities especially immune compromised. 2. Bronchiectasis and multifocal stellate densities in the left upper lobe favor post treatment age. Emphysematous disease. 3. Mild right hilar adenopathy.  Partially visualized right crural adenopathy and left adrenal gland nodularity. 4. 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   On: 03/12/2017 23:35   Dg Chest Port 1 View  Result Date: 03/12/2017 CLINICAL DATA:  Acute onset of generalized weakness and sepsis. Initial encounter. EXAM: PORTABLE CHEST 1 VIEW COMPARISON:  Chest radiograph performed 03/05/2017 FINDINGS: A 3.0 cm opacity is noted at the left midlung zone, with central lucency, concerning for central necrosis. Given interval development since the recent prior study, this is most likely infectious in nature. Atypical infections such as Actinomyces or Nocardia could have such an appearance. Tuberculosis cannot be excluded. There is elevation of the left hemidiaphragm. No pleural effusion or pneumothorax is seen. IMPRESSION: Enlarging 3.0 cm opacity at the left midlung zone, with apparent central necrosis. Given interval development since the recent prior study, this is most likely infectious in nature. Atypical infections such as Actinomyces or  Nocardia could have such an appearance. Tuberculosis cannot be excluded. These results were called by telephone at the time of interpretation on 03/12/2017 at 8:39 pm to the ER team, who verbally acknowledged these results. Electronically Signed   By: Garald Balding M.D.   On: 03/12/2017 20:44    Assessment/Plan: Audrey Mcgee is a 56 y.o. female with Stage IV lung cancer not currently on treatment since Nov 2017, with  PET scan 2/8 showing abnormal activity in neck chest and abd as well as  LUL nodularity. She is admitted for the third time in a month now with fevers and chills.  She has progressive decline with wt loss and had CT showing LLL cavitary opacity.  She is at risk of multiple infections including TB, atypical fungal pathogens and routine bacterial infections however since she is not on any chemo at this point I think risk of opportunistic infections are less. HIV negative.  BCX neg, Crypto neg, one AFB negative.  I agree with Dr Alva Garnet, most likely routine aspiration bacterial PNA and unasyn should suffice.  Rec I have asked resp to try to induce sputum - most importantly for MTB PCR  Continue unasyn for now Could consider bronch but she is hesitant to do this. Discussed empiric treatment with unasyn and then augmentin with serial follow up.  Thank you very much for the consult. Will follow with you.  Audrey Mcgee   03/16/2017, 3:53 PM

## 2017-03-16 NOTE — Progress Notes (Signed)
Shift assessment completed at 0730. Pt remains on airborne precautions with sitter present due to confusion. Pt is alert to self. Skin is warm and dry, lungs are decreased to l side, pt is on room air, respirations are unlabored. Telebox intact with ST noted. Abdomen is soft, bs heard. Ppp, no edema noted. Pt has midline catheter intact to her RUA, site is free of redness and swelling. Pt gave vague answer when asked if in pain. Since assessment, pt has been cooperative with meds but remains confused.

## 2017-03-16 NOTE — Consult Note (Signed)
PULMONARY CONSULT NOTE  Requesting MD/Service: Anselm Jungling Date of initial consultation: 03/16/17 Reason for consultation: cavitary lesion in LLL  PT PROFILE: 56 y.o. female with several year history of metastatic adenocarcinoma, generalized failure to thrive with several recent hospitalizations, newly discovered cavitary lesion in superior segment of left lower lobe  HPI:  As above. Patient is unable to provide much meaningful history. He does note that on admission, she was coughing with subjective fevers and rattling mucus. She denies pleuritic chest pain and hemoptysis. She does not feel that she is any better than on admission which was 4 days prior to this evaluation. She notes progressive weakness and poor appetite. Review of prior records indicates a history of dysphagia and she was to undergo speech swallow evaluation. It is not claar to me that this has been completed  Past Medical History:  Diagnosis Date  . COPD (chronic obstructive pulmonary disease) ([place])   . Dementia   . Hypertension   . Lung cancer ([place])   . Thyroid disease     Past Surgical History:  Procedure Laterality Date  . CESAREAN SECTION CLASSICAL    . STOMACH SURGERY     Pt reports for a tumor    MEDICATIONS: I have reviewed all medications and confirmed regimen as documented  Social History   Social History  . Marital status: Single    Spouse name: N/A  . Number of children: N/A  . Years of education: N/A   Occupational History  . Not on file.   Social History Main Topics  . Smoking status: Current Every Day Smoker    Years: 15.00    Types: Cigarettes  . Smokeless tobacco: Never Used  . Alcohol use 1.2 oz/week    2 Cans of beer per week  . Drug use: No  . Sexual activity: Not on file   Other Topics Concern  . Not on file   Social History Narrative  . No narrative on file    Family History  Problem Relation Age of Onset  . Hypertension Father   . Cancer Father   . Diabetes Father    . Cancer Maternal Aunt     ROS: No myalgias/arthralgias No new focal weakness or sensory deficits No otalgia, hearing loss, visual changes, nasal and sinus symptoms, mouth and throat problems No neck pain or adenopathy No abdominal pain, N/V/D, diarrhea, change in bowel pattern No dysuria, change in urinary pattern   Vitals:   03/15/17 2044 03/15/17 2334 03/16/17 0359 03/16/17 0730  BP: 136/76 117/69 125/87 (!) 115/91  Pulse: (!) 112 (!) 123 (!) 104 (!) 119  Resp: '20 19 20 18  '$ Temp: 98.1 F (36.7 C) 98 F (36.7 C) 98 F (36.7 C) 98 F (36.7 C)  TempSrc: Oral Oral Oral Oral  SpO2: 100% 100% 100% 100%  Weight:      Height:         EXAM:  Gen: No overt distress HEENT: NCAT, sclera white, oropharynx normal Neck: Supple without LAN, thyromegaly, JVD Lungs: breath sounds full, percussion normal, adventitious sounds: rhonchi on L Cardiovascular: Reg, no murmurs noted Abdomen: Soft, nontender, normal BS Ext: without clubbing, cyanosis, edema Neuro: CNs grossly intact, motor and sensory intact Skin: Limited exam, no lesions noted  DATA:   BMP Latest Ref Rng & Units 03/15/2017 03/14/2017 03/13/2017  Glucose 65 - 99 mg/dL 78 - 89  BUN 6 - 20 mg/dL <5(L) - 8  Creatinine 0.44 - 1.00 mg/dL 0.46 0.42(L) 0.40(L)  Sodium 135 -  145 mmol/L 133(L) - 136  Potassium 3.5 - 5.1 mmol/L 3.1(L) - 3.3(L)  Chloride 101 - 111 mmol/L 101 - 105  CO2 22 - 32 mmol/L 24 - 22  Calcium 8.9 - 10.3 mg/dL 9.4 - 9.3    CBC Latest Ref Rng & Units 03/15/2017 03/13/2017 03/12/2017  WBC 3.6 - 11.0 K/uL 7.5 11.2(H) 11.9(H)  Hemoglobin 12.0 - 16.0 g/dL 8.7(L) 8.9(L) 11.4(L)  Hematocrit 35.0 - 47.0 % 26.2(L) 27.4(L) 34.4(L)  Platelets 150 - 440 K/uL 391 363 396    CXR:  Cavitary lesion in L mid lung zone peripherally CT chest: ill-defined, patchy LUL infiltrates, NSC from previously. New finding of a 2.5 cm thick-walled cavitary lesion in the superior segment of the left lower lobe with surrounding  inflammatory changes  IMPRESSION:   Left lower lobe cavitary lesion - the differential diagnosis includes malignancy, cavitary bacterial infection, mycobacterial infection, other atypical infections including fungus. We have the advantage of a CT-PET scan from 01/29/17 which did not reveal this lesion. Therefore, I think it is unlikely to be a new malignant lesion. Likewise, mycobacterial infection seems unlikely. This is not the usual location for reactivation tuberculosis. However, it is a classic location for aspiration pneumonia. Given her dysphagia, I think that is the most likely diagnosis. She is presently being treated with Unasyn which should adequately cover aspiration pneumonia.   PLAN:  Under the direction of the Dr. Ola Spurr a QuantiFERON gold and AFB smears have been sent. Suggest that we await the results of the studies to decide whether further diagnostic workup for possible tuberculosis is indicated. At the present time, I am inclined to recommend that we treat her for aspiration pneumonia and follow-up with imaging studies in 3-4 weeks. However, if placement is going to be an issue, she might require that tuberculosis be absolutely ruled out before she can be placed in a skilled nursing facility. In that case, we can undertake bronchoscopy. If the QuantiFERON gold is positive, bronchoscopy will be revisited as an option.  Merton Border, MD PCCM service Mobile [phone] Pager [phone] 03/16/2017

## 2017-03-16 NOTE — Progress Notes (Signed)
[facility] at [place] NAME: Audrey Mcgee    MR#:  [id number]  DATE OF BIRTH:  01-Oct-1961  SUBJECTIVE:  Pt is confused some, but calm. Has sitter in room.  REVIEW OF SYSTEMS:    Review of Systems  Constitutional: Positive for malaise/fatigue and weight loss. Negative for chills and fever.  HENT: Negative.  Negative for ear discharge, ear pain, hearing loss, nosebleeds and sore throat.   Eyes: Negative.  Negative for blurred vision and pain.  Respiratory: Positive for cough and shortness of breath. Negative for hemoptysis and wheezing.   Cardiovascular: Negative.  Negative for chest pain, palpitations and leg swelling.  Gastrointestinal: Negative.  Negative for abdominal pain, blood in stool, diarrhea, nausea and vomiting.  Genitourinary: Negative.  Negative for dysuria.  Musculoskeletal: Negative.  Negative for back pain.  Skin: Negative.   Neurological: Positive for weakness. Negative for dizziness, tremors, speech change, focal weakness, seizures and headaches.  Endo/Heme/Allergies: Negative.  Does not bruise/bleed easily.  Psychiatric/Behavioral: Negative.  Negative for depression, hallucinations and suicidal ideas.    Tolerating Diet:yes   DRUG ALLERGIES:   Allergies  Allergen Reactions  . No Known Allergies     VITALS:  Blood pressure (!) 115/91, pulse (!) 119, temperature 98 F (36.7 C), temperature source Oral, resp. rate 18, height '4\' 11"'$  (1.499 m), weight 54.6 kg (120 lb 6.4 oz), last menstrual period 01/30/2008, SpO2 100 %.  PHYSICAL EXAMINATION:   Physical Exam  Constitutional: She is oriented to person, place, and time. No distress.  Frail thin  HENT:  Head: Normocephalic.  Eyes: No scleral icterus.  Neck: Normal range of motion. Neck supple. No JVD present. No tracheal deviation present.  Cardiovascular: Normal rate, regular rhythm and normal heart sounds.  Exam reveals no gallop and no friction rub.   No  murmur heard. Pulmonary/Chest: Effort normal and breath sounds normal. No respiratory distress. She has no wheezes. She has no rales. She exhibits no tenderness.  Abdominal: Soft. Bowel sounds are normal. She exhibits no distension and no mass. There is no tenderness. There is no rebound and no guarding.  Musculoskeletal: Normal range of motion. She exhibits no edema.  Neurological: She is alert and oriented to person, place, and time.  Skin: Skin is warm. No rash noted. No erythema.  Psychiatric: Affect and judgment normal.     LABORATORY PANEL:   CBC  Recent Labs Lab 03/15/17 0335  WBC 7.5  HGB 8.7*  HCT 26.2*  PLT 391   ------------------------------------------------------------------------------------------------------------------  Chemistries   Recent Labs Lab 03/12/17 2017  03/15/17 0335  NA 135  < > 133*  K 2.9*  < > 3.1*  CL 97*  < > 101  CO2 26  < > 24  GLUCOSE 129*  < > 78  BUN 12  < > <5*  CREATININE 0.63  < > 0.46  CALCIUM 10.2  < > 9.4  AST 24  --   --   ALT 9*  --   --   ALKPHOS 67  --   --   BILITOT 1.4*  --   --   < > = values in this interval not displayed. ------------------------------------------------------------------------------------------------------------------  Cardiac Enzymes  Recent Labs Lab 03/12/17 2017  TROPONINI <0.03   ------------------------------------------------------------------------------------------------------------------  RADIOLOGY:  No results found.   ASSESSMENT AND PLAN:    56 year old female with history of metastatic poorly differentiated adenocarcinoma of lung followed by Dr Mike Gip here with weakness and fevers.  1. Sepsis: Patient presents with leukocytosis and tachycardia. Sepsis is presumed to be due to pneumonia. Due to CT scan showing possible cavitary lesion she is being evaluated for tuberculosis with AFBs. Follow up on AFB cultures  Follow-up on fungal and blood cultures  Follow-up urine  histoplasma , crypto antigen and fungal serologies as per requested ID  Continue Unasyn  Consider bronchoscopy  if laboratories are unrevealing.  Appreciate ID consult   HIV negative.  2. Hypokalemia: replete and rechecked in am still low, replace again.  3. COPD without exacerbation: Continue inhalers  4. GERD: Continue PPI  5. Essential hypertension: Continue metoprolol  6. History of metastatic poorly differentiated adenocarcinoma of lung: appreciate ONCOLOGY eval Consider palliative care consult. Patient was referred to hospice at one point I tried to talk to her family on phone, but left a voice mail to get more details about this.  7. Moderate/severe protein calorie malnutrition: Dietary consult appreciated Continue Megace and feeding supplement  8. Hyperthyroidism: Continue Tapazole Thyroid function tests consistent with hyperthyroidism  Management plans discussed with the patient and boyfriend and she is in agreement.  CODE STATUS: full  TOTAL TIME TAKING CARE OF THIS PATIENT: 30 minutes.    POSSIBLE D/C 3-5 days, DEPENDING ON CLINICAL CONDITION.   [name] M.D on 03/16/2017 at 9:01 AM  Between 7am to 6pm - Pager - [phone] After 6pm go to [email or web address] - password [email or web address] [facility]  Office  [phone]  CC: Primary care physician; Donnie Coffin, MD  Note: This dictation was prepared with Dragon dictation along with smaller phrase technology. Any transcriptional errors that result from this process are unintentional.

## 2017-03-16 NOTE — Progress Notes (Signed)
Pt. Refused all HS medicine, offered several times

## 2017-03-17 ENCOUNTER — Inpatient Hospital Stay: Payer: Medicaid Other

## 2017-03-17 LAB — CULTURE, BLOOD (ROUTINE X 2)
CULTURE: NO GROWTH
Culture: NO GROWTH

## 2017-03-17 LAB — QUANTIFERON IN TUBE
QFT TB AG MINUS NIL VALUE: 0.01 [IU]/mL
QUANTIFERON MITOGEN VALUE: 0.29 IU/mL
QUANTIFERON NIL VALUE: 0.03 [IU]/mL
QUANTIFERON TB AG VALUE: 0.04 [IU]/mL
QUANTIFERON TB GOLD: UNDETERMINED

## 2017-03-17 LAB — FUNGAL ANTIBODIES PANEL, ID-BLOOD
ASPERGILLUS NIGER: NEGATIVE
Aspergillus flavus: NEGATIVE
Aspergillus fumigatus, IgG: NEGATIVE
BLASTOMYCES ABS, QN, DID: NEGATIVE
HISTOPLASMA AB ID: NEGATIVE

## 2017-03-17 LAB — BASIC METABOLIC PANEL
Anion gap: 6 (ref 5–15)
BUN: <5 mg/dL — ABNORMAL LOW (ref 6–20)
CHLORIDE: 108 mmol/L (ref 101–111)
CO2: 25 mmol/L (ref 22–32)
Calcium: 9.2 mg/dL (ref 8.9–10.3)
Creatinine, Ser: 0.51 mg/dL (ref 0.44–1.00)
GFR calc non Af Amer: >60 mL/min (ref >60)
Glucose, Bld: 76 mg/dL (ref 65–99)
POTASSIUM: 3.5 mmol/L (ref 3.5–5.1)
Sodium: 139 mmol/L (ref 135–145)

## 2017-03-17 LAB — QUANTIFERON TB GOLD ASSAY (BLOOD)

## 2017-03-17 LAB — HISTOPLASMA ANTIGEN, URINE

## 2017-03-17 NOTE — Progress Notes (Signed)
Speech Therapy Note: received order; consulted NSG re: pt's status. Pt has taken a more dysphagia diet of level 1-2 for easier intake during past admission. Diet adjusted slightly until BSE can be performed; Pills to be given in Puree. NSG updated and agreed.  ST services will f/u in the morning.     Orinda Kenner, [place], CCC-SLP

## 2017-03-17 NOTE — Evaluation (Signed)
Physical Therapy Evaluation Patient Details Name: Audrey Mcgee MRN: [id number] DOB: 1961-07-13 Today's Date: 03/17/2017   History of Present Illness  Pt is a 56 y.o. female presenting to hospital with weakness and shortness of breath, admitted with sepsis/shortness of breath.  PMH includes COPD, metastatic stage 4 lung CA, htn, h/o chemotherapy.  Clinical Impression  Pt generally weak, but able to do most of what was asked of her.  She did some minimal in-room walking but was very fatigued with the effort.  She showed some unsteadiness and occasionally used PT's hand for assist, but she was able to do some minimal walking w/o direct assist though she lacked confidence and had some unsteadiness.  Plan is likely home with hospice, could benefit from HHPT as well (apparently she has been having HHPT coming to the hotel?)    Follow Up Recommendations Home health PT    Equipment Recommendations       Recommendations for Other Services       Precautions / Restrictions Precautions Precautions: Fall Precaution Comments: droplet isolation Restrictions Weight Bearing Restrictions: No      Mobility  Bed Mobility Overal bed mobility: Modified Independent Bed Mobility: Supine to Sit;Sit to Supine     Supine to sit: Min guard Sit to supine: Min guard   General bed mobility comments: Pt slow but independent getting to from sitting EOB  Transfers Overall transfer level: Modified independent Equipment used: 1 person hand held assist Transfers: Sit to/from Stand Sit to Stand: Min guard         General transfer comment: PT hesistant on first standing up, able to maintain balance but with some unsteadiness and does better with holding PT's hand  Ambulation/Gait Ambulation/Gait assistance: Min guard Ambulation Distance (Feet): 15 Feet Assistive device: 1 person hand held assist       General Gait Details: Pt very fatigued with minimal in-room ambulation.  Reports she could  try more tomorrow but is too tired today to do more.   Stairs            Wheelchair Mobility    Modified Rankin (Stroke Patients Only)       Balance Overall balance assessment: Needs assistance                                           Pertinent Vitals/Pain Pain Assessment:  (baseline unrated chronic pain, minimal chest pain)    Home Living Family/patient expects to be discharged to:: Other (Comment) (lives in hotel) Living Arrangements: Children Available Help at Discharge: Available 24 hours/day                  Prior Function Level of Independence: Needs assistance   Gait / Transfers Assistance Needed: Ambulating without AD but h/o falls in past 12 months           Hand Dominance        Extremity/Trunk Assessment   Upper Extremity Assessment Upper Extremity Assessment: Generalized weakness    Lower Extremity Assessment Lower Extremity Assessment: Generalized weakness;Overall [facility] for tasks assessed       Communication   Communication:  (soft voice)  Cognition Arousal/Alertness: Awake/alert Behavior During Therapy: Flat affect Overall Cognitive Status: Within Functional Limits for tasks assessed  General Comments      Exercises     Assessment/Plan    PT Assessment Patient needs continued PT services  PT Problem List Decreased strength;Decreased activity tolerance;Decreased balance;Decreased mobility;Decreased knowledge of use of DME       PT Treatment Interventions DME instruction;Gait training;Stair training;Functional mobility training;Therapeutic activities;Therapeutic exercise;Balance training;Patient/family education    PT Goals (Current goals can be found in the Care Plan section)  Acute Rehab PT Goals Patient Stated Goal: To return home PT Goal Formulation: With patient Time For Goal Achievement: 03/31/17 Potential to Achieve Goals: Fair     Frequency Min 2X/week   Barriers to discharge        Co-evaluation               End of Session Equipment Utilized During Treatment: Gait belt Activity Tolerance: Patient limited by fatigue Patient left: in bed;with call bell/phone within reach;with nursing/sitter in room   PT Visit Diagnosis: Muscle weakness (generalized) (M62.81);History of falling (Z91.81);Other abnormalities of gait and mobility (R26.89)    Time: 8676-7209 PT Time Calculation (min) (ACUTE ONLY): 17 min   Charges:   PT Evaluation $PT Eval Low Complexity: 1 Procedure     PT G Codes:          Kreg Shropshire, DPT 03/17/2017, 3:31 PM

## 2017-03-17 NOTE — Care Management Note (Signed)
Case Management Note  Patient Details  Name: Audrey Mcgee MRN: [id number] Date of Birth: 1961/05/17  Subjective/Objective: TC to [facility], regarding discharge planning.  Audrey Mcgee states she and her mother, the patient,  live at the [facility]. They were living with the patients other daughter but moved out due to conflict with the home environment and patient wandering. [name] wants to take her mother back to the [facility] lodge where she can care for her one on one.  She wants [facility] [place] to care for her mother. Dr. Anselm Jungling notified of daughter wishes. Consult for hospice services initiated. [name] with [facility] notified. Daughter denies any DME needs at this time              Action/Plan:   Expected Discharge Date:                  Expected Discharge Plan:  Home w Hospice Care  In-House Referral:     Discharge planning Services  CM Consult  Post Acute Care Choice:    Choice offered to:  Adult Children  DME Arranged:    DME Agency:     HH Arranged:  Disease Management [place] Agency:  [facility]  Status of Service:  In process, will continue to follow  If discussed at Long Length of Stay Meetings, dates discussed:    Additional Comments:  Jolly Mango, RN 03/17/2017, 12:12 PM

## 2017-03-17 NOTE — Progress Notes (Signed)
[place] at [place] NAME: Audrey Mcgee    MR#:  [id number]  DATE OF BIRTH:  03/27/1961  SUBJECTIVE:  Pt is confused some, but calm. Has sitter in room.  Not able to produce much sputum.  REVIEW OF SYSTEMS:    Review of Systems  Constitutional: Positive for malaise/fatigue and weight loss. Negative for chills and fever.  HENT: Negative.  Negative for ear discharge, ear pain, hearing loss, nosebleeds and sore throat.   Eyes: Negative.  Negative for blurred vision and pain.  Respiratory: Positive for cough and shortness of breath. Negative for hemoptysis and wheezing.   Cardiovascular: Negative.  Negative for chest pain, palpitations and leg swelling.  Gastrointestinal: Negative.  Negative for abdominal pain, blood in stool, diarrhea, nausea and vomiting.  Genitourinary: Negative.  Negative for dysuria.  Musculoskeletal: Negative.  Negative for back pain.  Skin: Negative.   Neurological: Positive for weakness. Negative for dizziness, tremors, speech change, focal weakness, seizures and headaches.  Endo/Heme/Allergies: Negative.  Does not bruise/bleed easily.  Psychiatric/Behavioral: Negative.  Negative for depression, hallucinations and suicidal ideas.    Tolerating Diet:yes   DRUG ALLERGIES:   Allergies  Allergen Reactions  . No Known Allergies     VITALS:  Blood pressure 117/72, pulse (!) 108, temperature 97.8 F (36.6 C), temperature source Oral, resp. rate (!) 22, height '4\' 11"'$  (1.499 m), weight 54.6 kg (120 lb 6.4 oz), last menstrual period 01/30/2008, SpO2 100 %.  PHYSICAL EXAMINATION:   Physical Exam  Constitutional: She is oriented to person, place, and time. No distress.  Frail thin  HENT:  Head: Normocephalic.  Eyes: No scleral icterus.  Neck: Normal range of motion. Neck supple. No JVD present. No tracheal deviation present.  Cardiovascular: Normal rate, regular rhythm and normal heart sounds.  Exam reveals no  gallop and no friction rub.   No murmur heard. Pulmonary/Chest: Effort normal and breath sounds normal. No respiratory distress. She has no wheezes. She has no rales. She exhibits no tenderness.  Abdominal: Soft. Bowel sounds are normal. She exhibits no distension and no mass. There is no tenderness. There is no rebound and no guarding.  Musculoskeletal: Normal range of motion. She exhibits no edema.  Neurological: She is alert and oriented to person, place, and time.  Skin: Skin is warm. No rash noted. No erythema.  Psychiatric: Affect and judgment normal.     LABORATORY PANEL:   CBC  Recent Labs Lab 03/15/17 0335  WBC 7.5  HGB 8.7*  HCT 26.2*  PLT 391   ------------------------------------------------------------------------------------------------------------------  Chemistries   Recent Labs Lab 03/12/17 2017  03/17/17 0345  NA 135  < > 139  K 2.9*  < > 3.5  CL 97*  < > 108  CO2 26  < > 25  GLUCOSE 129*  < > 76  BUN 12  < > <5*  CREATININE 0.63  < > 0.51  CALCIUM 10.2  < > 9.2  AST 24  --   --   ALT 9*  --   --   ALKPHOS 67  --   --   BILITOT 1.4*  --   --   < > = values in this interval not displayed. ------------------------------------------------------------------------------------------------------------------  Cardiac Enzymes  Recent Labs Lab 03/12/17 2017  TROPONINI <0.03   ------------------------------------------------------------------------------------------------------------------  RADIOLOGY:  Dg Chest Port 1 View  Result Date: 03/17/2017 CLINICAL DATA:  Metastatic disease. EXAM: PORTABLE CHEST 1 VIEW COMPARISON:  CT 03/12/2017. FINDINGS: Mediastinum  and hilar structures are normal. Heart size normal. Left upper lobe infiltrate noted consistent with pneumonia. Stable elevation left hemidiaphragm. No pneumothorax. Catheter is noted over the right axilla. IMPRESSION: 1. Persistent left mid lung infiltrate noted. Previous identified cavitation  within the infiltrate is less obvious on today's exam. 2. Small catheter noted with tip projected over the right axilla, question clinical significance. Electronically Signed   By: Marcello Moores  Register   On: 03/17/2017 12:44     ASSESSMENT AND PLAN:    56 year old female with history of metastatic poorly differentiated adenocarcinoma of lung followed by Dr Mike Gip here with weakness and fevers.  1. Sepsis: Patient presents with leukocytosis and tachycardia. Sepsis is presumed to be due to pneumonia. Due to CT scan showing cavitary lesion she is being evaluated for tuberculosis with AFBs. Follow up on AFB cultures - one sputum is negative- not able to get another sample. Follow-up on fungal and blood cultures  Follow-up urine histoplasma , crypto antigen and fungal serologies as per requested ID  Continue Unasyn  Consider bronchoscopy  if laboratories are unrevealing.  Appreciate ID consult   HIV negative.   Also called Pulm consult- may not need further work ups.  2. Hypokalemia: replete and rechecked in am still low, replace again.   Corrected now,  3. COPD without exacerbation: Continue inhalers  4. GERD: Continue PPI  5. Essential hypertension: Continue metoprolol  6. History of metastatic poorly differentiated adenocarcinoma of lung: appreciate ONCOLOGY eval Consider palliative care consult. Patient was referred to hospice at one point  7. Moderate/severe protein calorie malnutrition: Dietary consult appreciated Continue Megace and feeding supplement  8. Hyperthyroidism: Continue Tapazole Thyroid function tests consistent with hyperthyroidism  Management plans discussed with the patient and boyfriend and she is in agreement.  CODE STATUS: full  TOTAL TIME TAKING CARE OF THIS PATIENT: 30 minutes.  Will get SLP and PT eval as a part of discharge planning.  POSSIBLE D/C 1-2 days, DEPENDING ON CLINICAL CONDITION.   [name] M.D on 03/17/2017 at 9:28  PM  Between 7am to 6pm - Pager - [phone] After 6pm go to [email or web address] - password [email or web address] [place] [place] [facility]  Office  [phone]  CC: Primary care physician; Donnie Coffin, MD  Note: This dictation was prepared with Dragon dictation along with smaller phrase technology. Any transcriptional errors that result from this process are unintentional.

## 2017-03-17 NOTE — Progress Notes (Signed)
[facility] INFECTI[facility] PROGRESS NOTE Date of Admission:  03/12/2017     ID: Audrey Mcgee is a 56 y.o. 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 Brother at bedside  ROS  Eleven systems are reviewed and negative except per hpi  Medications:  Antibiotics Given (last 72 hours)    Date/Time Action Medication Dose Rate   03/14/17 1842 Given   Ampicillin-Sulbactam (UNASYN) 3 g in sodium chloride 0.9 % 100 mL IVPB 3 g 200 mL/hr   03/14/17 2331 Given   Ampicillin-Sulbactam (UNASYN) 3 g in sodium chloride 0.9 % 100 mL IVPB 3 g 200 mL/hr   03/15/17 1455 Given   Ampicillin-Sulbactam (UNASYN) 3 g in sodium chloride 0.9 % 100 mL IVPB 3 g 200 mL/hr   03/15/17 2128 Given   Ampicillin-Sulbactam (UNASYN) 3 g in sodium chloride 0.9 % 100 mL IVPB 3 g 200 mL/hr   03/16/17 0403 Given   Ampicillin-Sulbactam (UNASYN) 3 g in sodium chloride 0.9 % 100 mL IVPB 3 g 200 mL/hr   03/16/17 0729 Given   Ampicillin-Sulbactam (UNASYN) 3 g in sodium chloride 0.9 % 100 mL IVPB 3 g 200 mL/hr   03/16/17 1458 Given   Ampicillin-Sulbactam (UNASYN) 3 g in sodium chloride 0.9 % 100 mL IVPB 3 g 200 mL/hr   03/16/17 2117 Given   Ampicillin-Sulbactam (UNASYN) 3 g in sodium chloride 0.9 % 100 mL IVPB 3 g 200 mL/hr   03/17/17 0959 Given   Ampicillin-Sulbactam (UNASYN) 3 g in sodium chloride 0.9 % 100 mL IVPB 3 g 200 mL/hr     . ampicillin-sulbactam (UNASYN) IV  3 g Intravenous Q6H  . enoxaparin (LOVENOX) injection  40 mg Subcutaneous Q24H  . feeding supplement (ENSURE ENLIVE)  237 mL Oral BID BM  . folic acid  1 mg Oral Daily  . Influenza vac split quadrivalent PF  0.5 mL Intramuscular Tomorrow-1000  . ipratropium-albuterol   3 mL Nebulization Q6H  . mouth rinse  15 mL Mouth Rinse BID  . megestrol  400 mg Oral BID  . methimazole  5 mg Oral Daily  . metoprolol  25 mg Oral BID  . pantoprazole  40 mg Oral Daily  . pneumococcal 23 valent vaccine  0.5 mL Intramuscular Tomorrow-1000  . potassium chloride  20 mEq Oral BID  . thiamine  100 mg Oral Daily  . tiotropium  18 mcg Inhalation Daily    Objective: Vital signs in last 24 hours: Temp:  [98.1 F (36.7 C)-98.5 F (36.9 C)] 98.5 F (36.9 C) (03/27 0928) Pulse Rate:  [96-106] 96 (03/27 0928) Resp:  [16-18] 18 (03/27 0928) BP: (125-150)/(74-79) 150/76 (03/27 0928) SpO2:  [100 %] 100 % (03/27 0928) Constitutional:  Awake and interactive but slowed mentation. Thin.  HENT: /AT, PERRLA, no scleral icterus Mouth/Throat: Oropharynx is clear and dry, poor dentition. No oropharyngeal exudate.  Cardiovascular: Normal rate, regular rhythm and normal heart sounds.  Pulmonary/Chest: poor air movement Neck = supple, no nuchal rigidity Abdominal: Soft. Bowel sounds are normal.  exhibits no distension. There is no tenderness.  Lymphadenopathy: no cervical adenopathy. No axillary adenopathy Neurological: alert and oriented to person, place, and time.  Skin: Skin is warm and dry. No rash noted. No erythema.  Ext  no edema, does have clubbing Psychiatric: slowed mentation,  Flat affect  Lab Results  Recent Labs  03/15/17 0335 03/17/17 0345  WBC 7.5  --   HGB 8.7*  --   HCT 26.2*  --   NA 133* 139  K 3.1* 3.5  CL 101 108  CO2 24 25  BUN <5* <5*  CREATININE 0.46 0.51    Microbiology: Results for orders placed or performed during the hospital encounter of 03/12/17  Blood culture (routine x 2)     Status: None   Collection Time: 03/12/17 10:18 PM  Result Value Ref Range Status   Specimen Description BLOOD R HAND  Final   Special Requests BOTTLES DRAWN AEROBIC AND ANAEROBIC BCAV  Final   Culture NO GROWTH 5 DAYS  Final   Report Status 03/17/2017 FINAL   Final  Blood culture (routine x 2)     Status: None   Collection Time: 03/12/17 10:18 PM  Result Value Ref Range Status   Specimen Description BLOOD L AC  Final   Special Requests BOTTLES DRAWN AEROBIC AND ANAEROBIC BCAV  Final   Culture NO GROWTH 5 DAYS  Final   Report Status 03/17/2017 FINAL  Final  Acid Fast Smear (AFB)     Status: None   Collection Time: 03/12/17 11:16 PM  Result Value Ref Range Status   AFB Specimen Processing Concentration  Final   Acid Fast Smear Negative  Final    Comment: (NOTE) Performed At: [facility] [place], [place] [id number] Lindon Romp MD [id number]    Source (AFB) SPU  Final  MRSA PCR Screening     Status: None   Collection Time: 03/13/17  4:58 PM  Result Value Ref Range Status   MRSA by PCR NEGATIVE NEGATIVE Final    Comment:        The GeneXpert MRSA Assay (FDA approved for NASAL specimens only), is one component of a comprehensive MRSA colonization surveillance program. It is not intended to diagnose MRSA infection nor to guide or monitor treatment for MRSA infections.     Studies/Results:  Dg Chest 1 View  Result Date: 03/05/2017 CLINICAL DATA:  Stage IV lung malignancy. Bitten by something in the mid back today with subsequent syncopal episode striking the back of the head. Recently discharged hospital with hypertension and tachycardia. Current smoker. EXAM: CHEST 1 VIEW COMPARISON:  Portable chest x-ray of March 02, 2017 FINDINGS: The lungs are adequately inflated. The left hemidiaphragm is chronically elevated. There is subtle increased density peripherally in the left mid lung. The heart and pulmonary vascularity are normal. There is calcification in the wall of the aortic arch. The mediastinum is normal in width. There is no pleural effusion. The observed bony thorax exhibits no acute abnormality. IMPRESSION: Hazy increased density peripherally in the left mid thorax. This does not appear to be  secondary to overlap of normal structures. No abnormal uptake was noted here on the January 29, 2017 PET CT study. This could reflect a pulmonary contusion or a focus of subsegmental atelectasis or early pneumonia. Malignancy is felt less likely. Followup PA and lateral chest X-ray is recommended in 3-4 weeks following trial of antibiotic therapy to ensure resolution and exclude underlying malignancy. Thoracic aortic atherosclerosis. Electronically Signed   By: Gracen Ringwald  [place] M.D.   On: 03/05/2017 15:41   Dg Chest 1 View  Result Date: 03/02/2017 CLINICAL DATA:  56 year old female with shortness of breath. History of COPD, and lung cancer. EXAM: CHEST  1 VIEW COMPARISON:  Chest radiograph dated 02/06/2017 and CT dated 07/08/2016 FINDINGS: There is stable mild elevation of the left hemidiaphragm. The lungs are clear. There is no pleural effusion or pneumothorax. The cardiac silhouette is within normal limits. No acute osseous pathology identified. IMPRESSION: No active disease.  No interval change. Electronically Signed   By: Anner Crete M.D.   On: 03/02/2017 20:14   Ct Head Wo Contrast  Result Date: 03/05/2017 CLINICAL DATA:  Syncope with short-term memory loss. History of lung carcinoma EXAM: CT HEAD WITHOUT CONTRAST TECHNIQUE: Contiguous axial images were obtained from the base of the skull through the vertex without intravenous contrast. COMPARISON:  Head CT February 06, 2017 and brain MRI February 10, 2017 FINDINGS: Brain: Mild diffuse atrophy is stable. There is no intracranial mass, hemorrhage, extra-axial fluid collection, or midline shift. There is mild patchy small vessel disease in the centra semiovale bilaterally. Elsewhere gray-white compartments appear normal. No acute infarct is demonstrable. Vascular: There is no hyperdense vessel. There is calcification in each carotid siphon region. Skull: Bony calvarium appears intact. There are no appreciable blastic or lytic bone lesions.  Sinuses/Orbits: Visualized paranasal sinuses are clear. Visualized orbits appear symmetric bilaterally. Other: There is stable opacification and several inferior posterior mastoid air cells. Other visualized mastoid air cells are clear. There is debris in the left external auditory canal, unchanged from prior study. IMPRESSION: Atrophy with mild periventricular small vessel disease. No intracranial mass, hemorrhage, or extra-axial fluid collection. Carotid siphon calcification noted. Chronic appearing mastoid disease on the left inferiorly. Electronically Signed   By: Lowella Grip III M.D.   On: 03/05/2017 15:30   Ct Chest W Contrast  Result Date: 03/12/2017 CLINICAL DATA:  Generalized weakness tachycardia EXAM: CT CHEST WITH CONTRAST TECHNIQUE: Multidetector CT imaging of the chest was performed during intravenous contrast administration. CONTRAST:  70m ISOVUE-300 IOPAMIDOL (ISOVUE-300) INJECTION 61% COMPARISON:  Chest x-ray 03/12/2017, PET-CT 01/29/2017, CT chest 07/08/2017 FINDINGS: Cardiovascular: Non aneurysmal aorta. Heart size normal. No large pericardial effusion. Mediastinum/Nodes: Thyroid within normal limits. Midline trachea. No gross enlarged mediastinal nodes. Right hilar adenopathy up to 11 mm. Esophagus within normal limits. Lungs/Pleura: Bronchiectasis and stellate densities in the left upper lobe are unchanged and most consistent with post treatment change. Interim finding of ground-glass density in the subpleural left lower lobe ; at the center of this is a cavitary lesion measuring 2.6 x 2.2 cm. Emphysematous disease is present. No pleural effusion. Additional subpleural focus of ground-glass density in the left lower lobe, series 3, image number 62. Upper Abdomen: Partially visualized right crural adenopathy measuring 11 mm. Partially visualized left adrenal gland nodularity. Musculoskeletal: Partially calcified nodule within the slightly lower inner right breast unchanged. No acute or  suspicious bone lesion. IMPRESSION: 1. 2.6 cm cavitary lesion in the subpleural left lower lobe with surrounding ground-glass density. Additional left posterior subpleural foci of ground-glass density. Differential considerations include septic embolus, cavitary neoplasm or cavitary metastatic focus, and cavitary infectious process to include mycobacterial etiology and atypical entities especially immune compromised. 2. Bronchiectasis and multifocal stellate densities in the left upper lobe favor post treatment age. Emphysematous disease. 3. Mild right hilar adenopathy. Partially visualized right crural adenopathy and left adrenal gland nodularity. 4. Oval partially calcified right breast mass unchanged, probably a fibroadenoma Electronically Signed   By: KDonavan FoilM.D.   On: 03/12/2017 23:35   Dg Chest Port 1 View  Result Date: 03/17/2017 CLINICAL DATA:  Metastatic disease. EXAM: PORTABLE CHEST 1 VIEW COMPARISON:  CT 03/12/2017. FINDINGS: Mediastinum and hilar structures are normal. Heart size normal. Left upper lobe infiltrate noted consistent with pneumonia. Stable elevation left hemidiaphragm. No pneumothorax. Catheter is noted over the right axilla. IMPRESSION: 1. Persistent left mid lung infiltrate noted. Previous identified cavitation within the infiltrate is less obvious on today's exam. 2. Small catheter noted with tip projected over the right axilla, question clinical significance. Electronically Signed   By: Marcello Moores  Register   On: 03/17/2017 12:44   Dg Chest Port 1 View  Result Date: 03/12/2017 CLINICAL DATA:  Acute onset of generalized weakness and sepsis. Initial encounter. EXAM: PORTABLE CHEST 1 VIEW COMPARISON:  Chest radiograph performed 03/05/2017 FINDINGS: A 3.0 cm opacity is noted at the left midlung zone, with central lucency, concerning for central necrosis. Given interval development since the recent prior study, this is most likely infectious in nature. Atypical infections such as  Actinomyces or Nocardia could have such an appearance. Tuberculosis cannot be excluded. There is elevation of the left hemidiaphragm. No pleural effusion or pneumothorax is seen. IMPRESSION: Enlarging 3.0 cm opacity at the left midlung zone, with apparent central necrosis. Given interval development since the recent prior study, this is most likely infectious in nature. Atypical infections such as Actinomyces or Nocardia could have such an appearance. Tuberculosis cannot be excluded. These results were called by telephone at the time of interpretation on 03/12/2017 at 8:39 pm to the ER team, who verbally acknowledged these results. Electronically Signed   By: Garald Balding M.D.   On: 03/12/2017 20:44    Assessment/Plan: Audrey Mcgee is a 56 y.o. female with Stage IV lung cancer not currently on treatment since Nov 2017, with  PET scan 2/8 showing abnormal activity in neck chest and abd as well as  LUL nodularity. She is admitted for the third time in a month now with fevers and chills.  She has progressive decline with wt loss and had CT showing LLL cavitary opacity.  She is at risk of multiple infections including TB, atypical fungal pathogens and routine bacterial infections however since she is not on any chemo at this point I think risk of opportunistic infections are less.  She seems to have a relatively unstable social situation - her and her daughter live at [place].  HIV negative.  BCX neg, Crypto neg, urine histo negative, one AFB negative.  I agree with Dr Alva Garnet, most likely routine aspiration bacterial PNA and unasyn should suffice. Repeat CXR today shows perhaps less cavitation She has been unable to produce much sputum.  Rec Can continue to try to induce sputum - most importantly for MTB PCR - but I dont  However if she cannot produce further sputum we do not have to keep her inpatient and she could be discharged with outpt followup and repeat imaging in 3-4 weeks to eval for  clearance. However if QFG is positive could consider bronch prior to DC Continue unasyn for now but can change to augmentin when stable for DC  Thank you very much for the consult. Will follow with you.  Kaylanie Capili P   03/17/2017, 3:11 PM

## 2017-03-18 ENCOUNTER — Telehealth: Payer: Self-pay | Admitting: Pulmonary Disease

## 2017-03-18 DIAGNOSIS — J189 Pneumonia, unspecified organism: Secondary | ICD-10-CM

## 2017-03-18 MED ORDER — AMOXICILLIN-POT CLAVULANATE 600-42.9 MG/5ML PO SUSR: 600.0000 mg | Freq: Two times a day (BID) | ORAL | 0 refills | Status: AC

## 2017-03-18 MED ORDER — METHIMAZOLE 5 MG PO TABS: 5.0000 mg | ORAL_TABLET | Freq: Every day | ORAL | 0 refills | Status: AC

## 2017-03-18 NOTE — Discharge Summary (Signed)
[facility] [place] NAME: Audrey Mcgee    MR#:  [id number]  DATE OF BIRTH:  [birth year]  DATE OF ADMISSION:  03/12/2017 ADMITTING PHYSICIAN: Lance Coon, MD  DATE OF DISCHARGE: 03/18/2017  PRIMARY CARE PHYSICIAN: Donnie Coffin, MD    ADMISSION DIAGNOSIS:  Pneumonia of left upper lobe due to infectious organism ([place]) [J18.1]  DISCHARGE DIAGNOSIS:  Principal Problem:   Sepsis ([place]) Active Problems:   Adenocarcinoma, lung ([place])   Hypothyroidism   Hypokalemia   COPD (chronic obstructive pulmonary disease) ([place])   HCAP (healthcare-associated pneumonia)   SECONDARY DIAGNOSIS:   Past Medical History:  Diagnosis Date  . COPD (chronic obstructive pulmonary disease) ([place])   . Dementia   . Hypertension   . Lung cancer ([place])   . Thyroid disease     HOSPITAL COURSE:   1. Sepsis: Patient presents with leukocytosis and tachycardia. Sepsis is presumed to be due to pneumonia. Due to CT scan showing cavitary lesion she is being evaluated for tuberculosis with AFBs. Follow up on AFB cultures - one sputum is negative- not able to get another sample. Quantiferone is intrermediate. Follow-up on fungal and blood cultures  Follow-up urine histoplasma , crypto antigen and fungal serologies as per requested ID  Continue Unasyn  Consider bronchoscopy  if laboratories are unrevealing.  Appreciate ID consult   HIV negative.   Also called Pulm consult- may not need further work ups.   Spoke to both Pulm and ID- suggest to give oral augmentin for 14 days and follow up xray chest.  2. Hypokalemia: replete and rechecked in am still low, replace again.   Corrected now,  3. COPD without exacerbation: Continue inhalers  4. GERD: Continue PPI  5. Essential hypertension: Continue metoprolol  6. History of metastatic poorly differentiated adenocarcinoma of lung: appreciate ONCOLOGY eval  palliative care consult. Patient was referred to  hospice at home.  7. Moderate/severe protein calorie malnutrition: Dietary consult appreciated Continue Megace and feeding supplement  8. Hyperthyroidism: Continue Tapazole Thyroid function tests consistent with hyperthyroidism  DISCHARGE CONDITIONS:   Stable.  CONSULTS OBTAINED:  Treatment Team:  Leonel Ramsay, MD Lequita Asal, MD [name], MD  DRUG ALLERGIES:   Allergies  Allergen Reactions  . No Known Allergies     DISCHARGE MEDICATIONS:   Current Discharge Medication List    START taking these medications   Details  amoxicillin-clavulanate (AUGMENTIN ES-600) 600-42.9 MG/5ML suspension Take 5 mLs (600 mg total) by mouth 2 (two) times daily. Qty: 200 mL, Refills: 0    methimazole (TAPAZOLE) 5 MG tablet Take 1 tablet (5 mg total) by mouth daily. Qty: 30 tablet, Refills: 0      CONTINUE these medications which have NOT CHANGED   Details  ALPRAZolam (XANAX) 0.25 MG tablet Take 1 tablet (0.25 mg total) by mouth 3 (three) times daily as needed for anxiety. Qty: 30 tablet, Refills: 0    docusate sodium (COLACE) 100 MG capsule Take 100 mg by mouth 2 (two) times daily as needed for mild constipation.    feeding supplement (BOOST / RESOURCE BREEZE) LIQD Take 1 Container by mouth 2 (two) times daily between meals. Qty: 90 Container, Refills: 6    folic acid (FOLVITE) 350 MCG tablet Take 800 mcg by mouth daily.     ipratropium-albuterol (DUONEB) 0.5-2.5 (3) MG/3ML SOLN Take 3 mLs by nebulization every 4 (four) hours. Qty: 360 mL, Refills: 5    lidocaine (XYLOCAINE) 2 %  solution Use as directed 10 mLs in the mouth or throat every 4 (four) hours as needed for mouth pain.    magnesium oxide (MAG-OX) 400 MG tablet Take 1 tablet (400 mg total) by mouth 2 (two) times daily. Qty: 30 tablet, Refills: 0    megestrol (MEGACE) 40 MG/ML suspension Take 10 mLs (400 mg total) by mouth 2 (two) times daily. Qty: 300 mL, Refills: 1    metoprolol  (LOPRESSOR) 50 MG tablet Take 0.5 tablets (25 mg total) by mouth 2 (two) times daily. Qty: 30 tablet, Refills: 0    Morphine Sulfate (MORPHINE CONCENTRATE) 10 mg / 0.5 ml concentrated solution Take 0.5 mLs (10 mg total) by mouth every 4 (four) hours as needed for severe pain. Qty: 30 mL, Refills: 0    omeprazole (PRILOSEC) 20 MG capsule Take 1 capsule (20 mg total) by mouth daily. Qty: 30 capsule, Refills: 3    ondansetron (ZOFRAN) 4 MG tablet Take 1 tablet (4 mg total) by mouth every 6 (six) hours as needed for nausea. Qty: 20 tablet, Refills: 0    potassium chloride 20 MEQ TBCR Take 1 tab twice a day Qty: 60 tablet, Refills: 0    RA VITAMIN B-1 100 MG TABS Take 100 mg by mouth daily. Refills: 0    senna (SENOKOT) 8.6 MG TABS tablet Take 1 tablet (8.6 mg total) by mouth daily. Qty: 120 each, Refills: 0    thiamine (VITAMIN B-1) 100 MG tablet Take 1 tablet (100 mg total) by mouth daily. Qty: 30 tablet, Refills: 1    tiotropium (SPIRIVA) 18 MCG inhalation capsule Place 1 capsule (18 mcg total) into inhaler and inhale daily. Qty: 30 capsule, Refills: 1    traZODone (DESYREL) 50 MG tablet Take 1 tablet (50 mg total) by mouth at bedtime as needed for sleep. Qty: 30 tablet, Refills: 1      STOP taking these medications     dexamethasone (DECADRON) 4 MG tablet      Oxycodone HCl 10 MG TABS          DISCHARGE INSTRUCTIONS:    Followed by Hospice services at home.  If you experience worsening of your admission symptoms, develop shortness of breath, life threatening emergency, suicidal or homicidal thoughts you must seek medical attention immediately by calling 911 or calling your MD immediately  if symptoms less severe.  You Must read complete instructions/literature along with all the possible adverse reactions/side effects for all the Medicines you take and that have been prescribed to you. Take any new Medicines after you have completely understood and accept all the  possible adverse reactions/side effects.   Please note  You were cared for by a hospitalist during your hospital stay. If you have any questions about your discharge medications or the care you received while you were in the hospital after you are discharged, you can call the unit and asked to speak with the hospitalist on call if the hospitalist that took care of you is not available. Once you are discharged, your primary care physician will handle any further medical issues. Please note that NO REFILLS for any discharge medications will be authorized once you are discharged, as it is imperative that you return to your primary care physician (or establish a relationship with a primary care physician if you do not have one) for your aftercare needs so that they can reassess your need for medications and monitor your lab values.    Today   CHIEF COMPLAINT:  Chief Complaint  Patient presents with  . Weakness    HISTORY OF PRESENT ILLNESS:  Audrey Mcgee  is a 56 y.o. female presents with Fevers and chills at home, weakness, malaise. Here in the ED she was found to meet sepsis criteria and had a lesion on her chest x-ray with some central necrosis. Concern is for an infectious process. Hospitalists were called for admission and further evaluation  VITAL SIGNS:  Blood pressure 140/78, pulse (!) 137, temperature 98.3 F (36.8 C), temperature source Oral, resp. rate (!) 22, height '4\' 11"'$  (1.499 m), weight 54.6 kg (120 lb 6.4 oz), last menstrual period 01/30/2008, SpO2 100 %.  I/O:   Intake/Output Summary (Last 24 hours) at 03/18/17 1314 Last data filed at 03/18/17 1040  Gross per 24 hour  Intake              100 ml  Output              351 ml  Net             -251 ml    PHYSICAL EXAMINATION:   Constitutional: She is oriented to person, place, and time. No distress.  Frail thin  HENT:  Head: Normocephalic.  Eyes: No scleral icterus.  Neck: Normal range of motion. Neck supple. No  JVD present. No tracheal deviation present.  Cardiovascular: Normal rate, regular rhythm and normal heart sounds.  Exam reveals no gallop and no friction rub.   No murmur heard. Pulmonary/Chest: Effort normal and breath sounds normal. No respiratory distress. She has no wheezes. She has no rales. She exhibits no tenderness.  Abdominal: Soft. Bowel sounds are normal. She exhibits no distension and no mass. There is no tenderness. There is no rebound and no guarding.  Musculoskeletal: Normal range of motion. She exhibits no edema.  Neurological: She is alert and not well oriented to person, place, and time.  Skin: Skin is warm. No rash noted. No erythema.  Psychiatric: Affect and judgment normal.    DATA REVIEW:   CBC  Recent Labs Lab 03/15/17 0335  WBC 7.5  HGB 8.7*  HCT 26.2*  PLT 391    Chemistries   Recent Labs Lab 03/12/17 2017  03/17/17 0345  NA 135  < > 139  K 2.9*  < > 3.5  CL 97*  < > 108  CO2 26  < > 25  GLUCOSE 129*  < > 76  BUN 12  < > <5*  CREATININE 0.63  < > 0.51  CALCIUM 10.2  < > 9.2  AST 24  --   --   ALT 9*  --   --   ALKPHOS 67  --   --   BILITOT 1.4*  --   --   < > = values in this interval not displayed.  Cardiac Enzymes  Recent Labs Lab 03/12/17 2017  TROPONINI <0.03    Microbiology Results  Results for orders placed or performed during the hospital encounter of 03/12/17  Blood culture (routine x 2)     Status: None   Collection Time: 03/12/17 10:18 PM  Result Value Ref Range Status   Specimen Description BLOOD R HAND  Final   Special Requests BOTTLES DRAWN AEROBIC AND ANAEROBIC BCAV  Final   Culture NO GROWTH 5 DAYS  Final   Report Status 03/17/2017 FINAL  Final  Blood culture (routine x 2)     Status: None   Collection Time: 03/12/17 10:18 PM  Result Value  Ref Range Status   Specimen Description BLOOD L AC  Final   Special Requests BOTTLES DRAWN AEROBIC AND ANAEROBIC BCAV  Final   Culture NO GROWTH 5 DAYS  Final   Report  Status 03/17/2017 FINAL  Final  Acid Fast Smear (AFB)     Status: None   Collection Time: 03/12/17 11:16 PM  Result Value Ref Range Status   AFB Specimen Processing Concentration  Final   Acid Fast Smear Negative  Final    Comment: (NOTE) Performed At: [facility] [place], [place] [id number] Lindon Romp MD UX:[id number]    Source (AFB) SPU  Final  MRSA PCR Screening     Status: None   Collection Time: 03/13/17  4:58 PM  Result Value Ref Range Status   MRSA by PCR NEGATIVE NEGATIVE Final    Comment:        The GeneXpert MRSA Assay (FDA approved for NASAL specimens only), is one component of a comprehensive MRSA colonization surveillance program. It is not intended to diagnose MRSA infection nor to guide or monitor treatment for MRSA infections.     RADIOLOGY:  Dg Chest Port 1 View  Result Date: 03/17/2017 CLINICAL DATA:  Metastatic disease. EXAM: PORTABLE CHEST 1 VIEW COMPARISON:  CT 03/12/2017. FINDINGS: Mediastinum and hilar structures are normal. Heart size normal. Left upper lobe infiltrate noted consistent with pneumonia. Stable elevation left hemidiaphragm. No pneumothorax. Catheter is noted over the right axilla. IMPRESSION: 1. Persistent left mid lung infiltrate noted. Previous identified cavitation within the infiltrate is less obvious on today's exam. 2. Small catheter noted with tip projected over the right axilla, question clinical significance. Electronically Signed   By: Marcello Moores  Register   On: 03/17/2017 12:44    EKG:   Orders placed or performed during the hospital encounter of 03/12/17  . EKG 12-Lead  . EKG 12-Lead      Management plans discussed with the patient, family and they are in agreement.  CODE STATUS:     Code Status Orders        Start     Ordered   03/13/17 0216  Full code  Continuous     03/13/17 0215    Code Status History    Date Active Date Inactive Code Status Order ID Comments User Context    03/02/2017 11:01 PM 03/04/2017  3:20 PM Full Code [id number]  Lance Coon, MD Inpatient   02/07/2017 10:26 AM 02/21/2017  8:14 PM Full Code [id number]  Saundra Shelling, MD Inpatient   11/07/2016  6:18 AM 11/11/2016  2:39 PM Full Code [id number]  Harrie Foreman, MD Inpatient   06/21/2016 11:52 AM 06/22/2016  3:07 PM Full Code [id number]  [name], MD Inpatient      TOTAL TIME TAKING CARE OF THIS PATIENT: 35 minutes.    [name] M.D on 03/18/2017 at 1:14 PM  Between 7am to 6pm - Pager - [phone]  After 6pm go to [email or web address] - password [email or web address] [facility]  Office  [phone]  CC: Primary care physician; Donnie Coffin, MD   Note: This dictation was prepared with Dragon dictation along with smaller phrase technology. Any transcriptional errors that result from this process are unintentional.

## 2017-03-18 NOTE — Progress Notes (Signed)
Patient is alert to self and states no pain at this time. Vital signs stable, IV access removed. Patient's family at bedside. All discharge instructions gone over at this time. Hard scripts given to family. Family member verbalized understanding of instructions, follow up care and where to pick up medications. Family will transport patient home at this time.

## 2017-03-18 NOTE — Discharge Instructions (Signed)
To be followed by Hospice at home.

## 2017-03-18 NOTE — Evaluation (Addendum)
Clinical/Bedside Swallow Evaluation Patient Details  Name: Audrey Mcgee MRN: [id number] Date of Birth: 1961/09/16  Today's Date: 03/18/2017 Time: SLP Start Time (ACUTE ONLY): 0845 SLP Stop Time (ACUTE ONLY): 0945 SLP Time Calculation (min) (ACUTE ONLY): 60 min  Past Medical History:  Past Medical History:  Diagnosis Date  . COPD (chronic obstructive pulmonary disease) ([place])   . Dementia   . Hypertension   . Lung cancer ([place])   . Thyroid disease    Past Surgical History:  Past Surgical History:  Procedure Laterality Date  . CESAREAN SECTION CLASSICAL    . STOMACH SURGERY     Pt reports for a tumor   HPI:  Pt is a 56 year-old female with known stage IV adenocarcinoma the lung, COPD, Dementia who has had recent admitted to the hospital with increasing weakness, fatigue, and confusion. She has a poor appetite and minimal PO intake then. She was recently evaluated by oncology on December 06, 2016, but did not receive chemotherapy at this time. Only IV fluids. Patient is a poor historian and review of systems is difficult. She has no neurologic complaints. She complains of nausea, fevers and chills at home, weakness, malaise. Here in the ED she was found to meet sepsis criteria and had a lesion on her chest x-ray with some central necrosis; ID is r/o TB. Patient is generally feels terrible, but offers no further specific complaints. Recently, an abnormal PET scan 01-23-17 suggestive of advanced cancer in neck, chest and upper abdomen who admitted for failure to thrive., dehydration and poor appetite. No specific reports of difficulty swallowing; previously some solids gave her discomfort at the level of sternal notch. She usually only accepts bites and sips; has much outside foods/drinks from [place] in the room from last night.    Assessment / Plan / Recommendation Clinical Impression  Pt appears at reduced risk for aspiration when following general aspiration precautions. Pt consumed  (several)trials of thin liquids via straw and puree-type foods w/ 1 trial of solids(bacon) w/ no immediate, overt s/s of aspiration noted - clear vocal quality noted post trials and no decline in respiratory status noted w/ trials. Pt exhibited adequate bolus management of these trial consistencies w/ adequate oral clearing. However, pt did not accept many trials of foods stating "they must have done something to this Oatmeal and food because of the way it tastes". Unsure if any Esophageal phase discomfort or dysmotility d/t reduced solid trials intake, however, pt did not c/o or exhibit any Esophageal deficits; moderate belching at prior evaluations. Pt has a baseline of reduced oral intake for some time now. Noted reports of Cancer and lack of ongoing treatment per Oncology. Unsure if pt's lack of intake is d/t this or a physiological swallowing deficit. Pt's desire for po's and taste buds may be impacted by extended illness. Pt appears at her baseline w/ regard to swallowing as long as she follows general aspiration precautions including sitting upright w/ any po's and taking small bites/sips. Recommend continue the oral diet w/ thin liquids w/ general aspiration precautions as ordered; foods of pt's preference and broken down/moist for easier swallowing. Recommend Dietician f/u for nutritional supplement; Pills given in Puree for easier swallowing/Esophageal clearing as needed. No further skilled ST services indicated at this time. Pt agreed; NSG updated.  SLP Visit Diagnosis: Dysphagia, unspecified (R13.10) No noted oropharyngeal phase dysphagia noted at this assessment.    Aspiration Risk   (reduced following general precautions(sitting up w/ po's))    Diet  Recommendation  Regular - cut meats, moistened foods; Thin liquids. General aspiration precautions including sitting fully upright and taking small bites/sip during meals. General REFLUX precautions.  Medication Administration: Whole meds with puree  (for easier clearing d/t Esophageal dysmotility in past)    Other  Recommendations Recommended Consults: Consider GI evaluation Oral Care Recommendations: Oral care BID;Patient independent with oral care   Follow up Recommendations None      Frequency and Duration            Prognosis Prognosis for Safe Diet Advancement: Good Barriers to Reach Goals: Cognitive deficits;Severity of deficits      Swallow Study   General Date of Onset: 03/12/17 HPI: Pt is a 56 year-old female with known stage IV adenocarcinoma the lung, COPD, Dementia who has had recent admitted to the hospital with increasing weakness, fatigue, and confusion. She has a poor appetite and minimal PO intake then. She was recently evaluated by oncology on December 06, 2016, but did not receive chemotherapy at this time. Only IV fluids. Patient is a poor historian and review of systems is difficult. She has no neurologic complaints. She complains of nausea, fevers and chills at home, weakness, malaise. Here in the ED she was found to meet sepsis criteria and had a lesion on her chest x-ray with some central necrosis; ID is r/o TB. Patient is generally feels terrible, but offers no further specific complaints. Recently, an abnormal PET scan 01-23-17 suggestive of advanced cancer in neck, chest and upper abdomen who admitted for failure to thrive., dehydration and poor appetite. No specific reports of difficulty swallowing; previously some solids gave her discomfort at the level of sternal notch. She usually only accepts bites and sips; has much outside foods/drinks from [place] in the room from last night.  Type of Study: Bedside Swallow Evaluation Previous Swallow Assessment: during the 02/06/17 admission but remained on a mostly regular diet consistency - meats cut Diet Prior to this Study: Dysphagia 3 (soft);Regular;Thin liquids Temperature Spikes Noted: No (wbc 7.5; down from 11) Respiratory Status: Room air History of Recent  Intubation: No Behavior/Cognition: Alert;Cooperative;Agitated;Distractible;Requires cueing Oral Cavity Assessment:  (did not participate in d/t eating/drinking) Oral Care Completed by SLP: Recent completion by staff Oral Cavity - Dentition: Adequate natural dentition Vision: Functional for self-feeding Self-Feeding Abilities: Able to feed self Patient Positioning: Upright in bed (needed cues to sit fully upright) Baseline Vocal Quality: Normal Volitional Cough:  (did not participate) Volitional Swallow: Able to elicit    Oral/Motor/Sensory Function Overall Oral Motor/Sensory Function: Within functional limits (appeared wfl during speech and bolus management)   Ice Chips Ice chips: Not tested   Thin Liquid Thin Liquid: Within functional limits Presentation: Self Fed;Straw (all drinks had straws in them)    Nectar Thick Nectar Thick Liquid: Not tested   Honey Thick Honey Thick Liquid: Not tested   Puree Puree: Within functional limits Presentation: Self Fed;Spoon (oatmeal; 6 trials)   Solid   GO   Solid: Within functional limits Presentation: Self Fed (trial of bacon) Other Comments: "the bacon is too tough and doesn't taste good"         Orinda Kenner, MS, CCC-SLP Caidyn Blossom 03/18/2017,10:11 AM

## 2017-03-18 NOTE — Progress Notes (Signed)
Referral for [facility] services at home received from [facility]. Patient was to be opened to hospice services on 3/23 but was hospitalized at [facility] prior to admission. Patient was receiving [facility] home [facility] prior to hospice referral. Updated patient information and discharge summary faxed to referral. Writer spoke via telephone to patient's daughter [name], who confirmed discharge address and no current DME needs. She also confirmed that patient will be discharging home via car today. Thank you. [name] RN, BSN, [facility] [place], hospital Liaison [phone] c

## 2017-03-18 NOTE — Care Management Note (Signed)
Case Management Note  Patient Details  Name: Dolce Sylvia MRN: [id number] Date of Birth: 01-08-1961  Subjective/Objective:   Spoke with Morene Crocker regarding discharge. Patient will be transported home by car. Notified [name] with [place] of discharge. She has patients correct address and will call daughter soon.                  Action/Plan:   Expected Discharge Date:  03/18/17               Expected Discharge Plan:  Home w Hospice Care  In-House Referral:     Discharge planning Services  CM Consult  Post Acute Care Choice:  Hospice Choice offered to:  Adult Children  DME Arranged:    DME Agency:     HH Arranged:  Disease Management [place] Agency:  [facility]  Status of Service:  Completed, signed off  If discussed at [place] of Stay Meetings, dates discussed:    Additional Comments:  Jolly Mango, RN 03/18/2017, 2:08 PM

## 2017-03-18 NOTE — Progress Notes (Signed)
PULMONARY CONSULT NOTE  Requesting MD/Service: Anselm Jungling Date of initial consultation: 03/16/17 Reason for consultation: cavitary lesion in LLL  PT PROFILE: 56 y.o. female with several year history of metastatic adenocarcinoma, generalized failure to thrive with several recent hospitalizations, newly discovered cavitary lesion in superior segment of left lower lobe  SUBJ: No new complaints. No distress  Vitals:   03/17/17 2331 03/18/17 0327 03/18/17 0742 03/18/17 1033  BP: 113/81 132/80 140/78   Pulse: (!) 103 100 (!) 102 (!) 137  Resp: (!) '22 20 18 '$ (!) 22  Temp: 97.9 F (36.6 C) 98.4 F (36.9 C) 98.3 F (36.8 C)   TempSrc: Oral Oral Oral   SpO2: 99% 100% 100% 100%  Weight:      Height:         EXAM:  Gen: No overt distress HEENT: WNL Neck: Supple without LAN, thyromegaly, JVD Lungs: breath sounds full, no adventitious sounds Cardiovascular: Reg, no murmurs noted Abdomen: Soft, nontender, normal BS Ext: without clubbing, cyanosis, edema Neuro: grossly intact  DATA:   BMP Latest Ref Rng & Units 03/17/2017 03/15/2017 03/14/2017  Glucose 65 - 99 mg/dL 76 78 -  BUN 6 - 20 mg/dL <5(L) <5(L) -  Creatinine 0.44 - 1.00 mg/dL 0.51 0.46 0.42(L)  Sodium 135 - 145 mmol/L 139 133(L) -  Potassium 3.5 - 5.1 mmol/L 3.5 3.1(L) -  Chloride 101 - 111 mmol/L 108 101 -  CO2 22 - 32 mmol/L 25 24 -  Calcium 8.9 - 10.3 mg/dL 9.2 9.4 -    CBC Latest Ref Rng & Units 03/15/2017 03/13/2017 03/12/2017  WBC 3.6 - 11.0 K/uL 7.5 11.2(H) 11.9(H)  Hemoglobin 12.0 - 16.0 g/dL 8.7(L) 8.9(L) 11.4(L)  Hematocrit 35.0 - 47.0 % 26.2(L) 27.4(L) 34.4(L)  Platelets 150 - 440 K/uL 391 363 396  QFG: indeterminate  CXR (03/17/17):  Vague L midlung opacity without obvious cavitation   IMPRESSION:   Left lower lobe opacity/cavitation - low likelihood of TB, I think but the QFG is indeterminate.   PLAN:  I concur with Dr Blane Ohara plan to complete a course of abx for presumed aspiration PNA with  cavitation - would treat a full 10-14 days. OK to change to Augmentin @ any time from my perspective. She will need repeat CXR in 3-4 weeks and possible repeat Ct chest depeding on results of CXR   PCCM will sign off. Please call if we can be of further assistance   Merton Border, MD PCCM service Mobile [phone] Pager [phone] 03/18/2017

## 2017-03-18 NOTE — Telephone Encounter (Signed)
Please schedule pt in 4-5 weeks with a CXR 1 hour prior to appt and then come over here for appt. CXR ordered.

## 2017-03-18 NOTE — Progress Notes (Signed)
Pt. Refused all her medication throughout the night.

## 2017-03-18 NOTE — Telephone Encounter (Signed)
Pt needs hospital f/u. Please advise where to schedule.

## 2017-03-18 NOTE — Progress Notes (Signed)
Nutrition Follow-up  DOCUMENTATION CODES:   Severe malnutrition in context of chronic illness  INTERVENTION:  1. Chocolate Milk with all trays 2. [place] sandwich, Grilled cheese ordered between meals. 3. D/C CIB, EE  NUTRITION DIAGNOSIS:   Malnutrition (Severe) related to chronic illness (stage IV lung cancer) as evidenced by severe depletion of body fat, severe depletion of muscle mass. -ongoing  GOAL:   Patient will meet greater than or equal to 90% of their needs -not meeting  MONITOR:   PO intake, Supplement acceptance, Labs, Weight trends, I & O's  REASON FOR ASSESSMENT:   Malnutrition Screening Tool, Consult Assessment of nutrition requirement/status  ASSESSMENT:   56 year old female with PMHx of metastatic stage IV lung cancer, COPD, HTN, Dementia, who was followed by [facility] at home presents with fevers, chills, weakness, malaise found to meet sepsis criteria and also found with lesion on chest x-ray with central necrosis.  Eating bites, only really ate her oatmeal this morning. Little interest in food. Only 25% documented meal completion thus far - patient on airborne precautions. She stated she would like sandwiches, grilled cheese. Will monitor to see how she does with those snacks. Labs and medications reviewed.  Diet Order:  Diet regular Room service appropriate? Yes with Assist; Fluid consistency: Thin  Skin:  Reviewed, no issues  Last BM:  03/12/2017  Height:   Ht Readings from Last 1 Encounters:  03/12/17 '4\' 11"'$  (1.499 m)    Weight:   Wt Readings from Last 1 Encounters:  03/13/17 120 lb 6.4 oz (54.6 kg)    Ideal Body Weight:  54.5 kg  BMI:  Body mass index is 24.32 kg/m.  Estimated Nutritional Needs:   Kcal:  1640-1910 (30-35 kcal/kg)  Protein:  80-100 grams (1.5-1.8 grams/kg)  Fluid:  1.6 L/day (30 ml/kg)  EDUCATION NEEDS:   Education needs no appropriate at this time  Audrey Mcgee. Srah Ake, MS, RD LDN Inpatient Clinical  Dietitian Pager [phone]

## 2017-03-19 ENCOUNTER — Encounter: Payer: Self-pay | Admitting: Emergency Medicine

## 2017-03-19 ENCOUNTER — Emergency Department: Payer: Medicaid Other

## 2017-03-19 ENCOUNTER — Emergency Department
Admission: EM | Admit: 2017-03-19 | Discharge: 2017-03-19 | Disposition: A | Discharge status: Home / Self Care | Payer: Medicaid Other | Attending: Emergency Medicine | Admitting: Emergency Medicine

## 2017-03-19 DIAGNOSIS — R55 Syncope and collapse: Secondary | ICD-10-CM | POA: Diagnosis not present

## 2017-03-19 DIAGNOSIS — J449 Chronic obstructive pulmonary disease, unspecified: Secondary | ICD-10-CM | POA: Diagnosis not present

## 2017-03-19 DIAGNOSIS — Z85118 Personal history of other malignant neoplasm of bronchus and lung: Secondary | ICD-10-CM | POA: Diagnosis not present

## 2017-03-19 DIAGNOSIS — I10 Essential (primary) hypertension: Secondary | ICD-10-CM | POA: Insufficient documentation

## 2017-03-19 DIAGNOSIS — F17210 Nicotine dependence, cigarettes, uncomplicated: Secondary | ICD-10-CM | POA: Diagnosis not present

## 2017-03-19 DIAGNOSIS — R42 Dizziness and giddiness: Secondary | ICD-10-CM | POA: Diagnosis present

## 2017-03-19 DIAGNOSIS — E039 Hypothyroidism, unspecified: Secondary | ICD-10-CM | POA: Insufficient documentation

## 2017-03-19 DIAGNOSIS — Z79899 Other long term (current) drug therapy: Secondary | ICD-10-CM | POA: Insufficient documentation

## 2017-03-19 LAB — BASIC METABOLIC PANEL
ANION GAP: 10 (ref 5–15)
BUN: 6 mg/dL (ref 6–20)
CHLORIDE: 103 mmol/L (ref 101–111)
CO2: 24 mmol/L (ref 22–32)
Calcium: 9.3 mg/dL (ref 8.9–10.3)
Creatinine, Ser: 0.47 mg/dL (ref 0.44–1.00)
GFR calc Af Amer: >60 mL/min (ref >60)
Glucose, Bld: 90 mg/dL (ref 65–99)
POTASSIUM: 3.3 mmol/L — AB (ref 3.5–5.1)
SODIUM: 137 mmol/L (ref 135–145)

## 2017-03-19 LAB — CBC
HEMATOCRIT: 31.7 % — AB (ref 35.0–47.0)
HEMOGLOBIN: 10.8 g/dL — AB (ref 12.0–16.0)
MCH: 30.7 pg (ref 26.0–34.0)
MCHC: 34.1 g/dL (ref 32.0–36.0)
MCV: 90.1 fL (ref 80.0–100.0)
Platelets: 444 10*3/uL — ABNORMAL HIGH (ref 150–440)
RBC: 3.52 MIL/uL — AB (ref 3.80–5.20)
RDW: 14.6 % — ABNORMAL HIGH (ref 11.5–14.5)
WBC: 13.1 10*3/uL — AB (ref 3.6–11.0)

## 2017-03-19 LAB — TROPONIN I: Troponin I: <0.03 ng/mL (ref <0.03)

## 2017-03-19 MED ORDER — SODIUM CHLORIDE 0.9 % IV BOLUS (SEPSIS)
1000.0000 mL | Freq: Once | INTRAVENOUS | Status: AC
  Administered 2017-03-19: 1000 mL via INTRAVENOUS

## 2017-03-19 NOTE — ED Notes (Signed)

## 2017-03-19 NOTE — ED Notes (Signed)
Patient transported to X-ray 

## 2017-03-19 NOTE — ED Provider Notes (Signed)
[facility] Emergency Department Provider Note  Time seen: 3:27 PM  I have reviewed the triage vital signs and the nursing notes.   HISTORY  Chief Complaint Shortness of Breath and Palpitations    HPI Audrey Mcgee is a 56 y.o. female with a past medical history of COPD, dementia, hypertension, presents to the emergency department with lightheadedness. According to the patient and record review the patient was discharged from the hospital yesterday afternoon admission for a left-sided cavitary lung lesion. This appears to have been extensively worked up and ultimately thought most likely to be infectious. Patient was discharged on Augmentin. Patient states she was doing well but today while walking she became lightheaded and almost fell to the ground. She states someone at the apartment building called EMS who brought her to the emergency department. Denies any increased shortness of breath.  Denies any chest pain.  Patient states she feels normal currently and does not want to be here and is wishing to go home. Of note the patient does have a pulse rate of 126 while lying comfortably in bed.  Past Medical History:  Diagnosis Date  . COPD (chronic obstructive pulmonary disease) ([place])   . Dementia   . Hypertension   . Lung cancer ([place])   . Thyroid disease     Patient Active Problem List   Diagnosis Date Noted  . HCAP (healthcare-associated pneumonia) 03/12/2017  . Hyperthyroidism 03/02/2017  . COPD (chronic obstructive pulmonary disease) ([place]) 03/02/2017  . Subacute delirium 02/18/2017  . Altered mental status 02/12/2017  . Cannabis abuse 02/12/2017  . Nausea 02/12/2017  . Failure to thrive in adult 02/12/2017  . Hypokalemia 02/12/2017  . Generalized weakness 02/12/2017  . Lung cancer ([place]) 02/12/2017  . Hypothyroidism 02/11/2017  . Malignancy ([place])   . Palliative care by specialist   . Goals of care, counseling/discussion   . DNR (do not  resuscitate) discussion   . Depressive disorder   . Dehydration 02/07/2017  . Weight loss 02/07/2017  . Encounter for antineoplastic chemotherapy 02/01/2017  . 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 Sepsis ([place]) 11/07/2016  . Protein-calorie malnutrition, severe 11/07/2016  . Acute respiratory distress 06/21/2016  . COPD exacerbation ([place]) 06/21/2016  . Sinus tachycardia 06/21/2016  . Recurrent aspiration bronchitis/pneumonia ([place]) 06/21/2016  . Dysphagia 06/21/2016  . Nausea and vomiting 06/21/2016  . Dental abscess 05/10/2016  . Swallowing difficulty 03/28/2016  . Hypomagnesemia 02/29/2016  . Hoarseness 02/18/2016  . Cancer related pain 07/08/2015  . Adenocarcinoma, lung ([place]) 03/26/2015  . 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 Ear ache 10/03/2014  . Abscess of buccal cavity 10/03/2014  . Lump in neck 10/03/2014  . Laryngeal pain 10/03/2014    Past Surgical History:  Procedure Laterality Date  . CESAREAN SECTION CLASSICAL    . STOMACH SURGERY     Pt reports for a tumor    Prior to Admission medications   Medication Sig Start Date End Date Taking? Authorizing Provider  ALPRAZolam (XANAX) 0.25 MG tablet Take 1 tablet (0.25 mg total) by mouth 3 (three) times daily as needed for anxiety. 03/05/17   [name], MD  amoxicillin-clavulanate (AUGMENTIN ES-600) 600-42.9 MG/5ML suspension Take 5 mLs (600 mg total) by mouth 2 (two) times daily. 03/18/17 04/01/17  [name], MD  docusate sodium (COLACE) 100 MG capsule Take 100 mg by mouth 2 (two) times daily as needed for mild constipation.    Historical Provider, MD  feeding supplement (BOOST /  RESOURCE BREEZE) LIQD Take 1 Container by mouth 2 (two) times daily between meals. 02/12/17   [name], MD  folic acid (FOLVITE) 967 MCG tablet Take 800 mcg by mouth daily.     Historical Provider, MD  ipratropium-albuterol (DUONEB) 0.5-2.5 (3) MG/3ML SOLN Take 3 mLs by nebulization every 4  (four) hours. 02/12/17   [name], MD  lidocaine (XYLOCAINE) 2 % solution Use as directed 10 mLs in the mouth or throat every 4 (four) hours as needed for mouth pain.    Historical Provider, MD  magnesium oxide (MAG-OX) 400 MG tablet Take 1 tablet (400 mg total) by mouth 2 (two) times daily. 03/04/17   [name], MD  megestrol (MEGACE) 40 MG/ML suspension Take 10 mLs (400 mg total) by mouth 2 (two) times daily. 02/02/17   Lequita Asal, MD  methimazole (TAPAZOLE) 5 MG tablet Take 1 tablet (5 mg total) by mouth daily. 03/19/17   [name], MD  metoprolol (LOPRESSOR) 50 MG tablet Take 0.5 tablets (25 mg total) by mouth 2 (two) times daily. 03/04/17   [name], MD  Morphine Sulfate (MORPHINE CONCENTRATE) 10 mg / 0.5 ml concentrated solution Take 0.5 mLs (10 mg total) by mouth every 4 (four) hours as needed for severe pain. 03/05/17   [name], MD  omeprazole (PRILOSEC) 20 MG capsule Take 1 capsule (20 mg total) by mouth daily. 06/20/16   Lequita Asal, MD  ondansetron (ZOFRAN) 4 MG tablet Take 1 tablet (4 mg total) by mouth every 6 (six) hours as needed for nausea. 02/12/17   [name], MD  potassium chloride 20 MEQ TBCR Take 1 tab twice a day 03/04/17   [name], MD  RA VITAMIN B-1 100 MG TABS Take 100 mg by mouth daily. 02/23/17   Historical Provider, MD  senna (SENOKOT) 8.6 MG TABS tablet Take 1 tablet (8.6 mg total) by mouth daily. 02/13/17   [name], MD  thiamine (VITAMIN B-1) 100 MG tablet Take 1 tablet (100 mg total) by mouth daily. 02/21/17   [name], MD  tiotropium (SPIRIVA) 18 MCG inhalation capsule Place 1 capsule (18 mcg total) into inhaler and inhale daily. 06/22/16   [name], MD  traZODone (DESYREL) 50 MG tablet Take 1 tablet (50 mg total) by mouth at bedtime as needed for sleep. 02/21/17   [name], MD    Allergies  Allergen Reactions  . No Known Allergies     Family History  Problem Relation Age of Onset  . Hypertension  Father   . Cancer Father   . Diabetes Father   . Cancer Maternal Aunt     Social History Social History  Substance Use Topics  . Smoking status: Current Every Day Smoker    Packs/day: 0.25    Years: 15.00    Types: Cigarettes  . Smokeless tobacco: Never Used  . Alcohol use No    Review of Systems Constitutional: Negative for fever Cardiovascular: Negative for chest pain. Respiratory: States mild shortness breath although unchanged for the past several weeks. Gastrointestinal: Negative for abdominal pain Neurological: Negative for headache 10-point ROS otherwise negative.  ____________________________________________   PHYSICAL EXAM:  VITAL SIGNS: ED Triage Vitals  Enc Vitals Group     BP 03/19/17 1406 (!) 123/99     Pulse Rate 03/19/17 1406 (!) 126     Resp 03/19/17 1406 16     Temp 03/19/17 1406 98.5 F (36.9 C)     Temp Source 03/19/17  1406 Oral     SpO2 03/19/17 1406 100 %     Weight 03/19/17 1407 115 lb (52.2 kg)     Height 03/19/17 1407 '5\' 1"'$  (1.549 m)     Head Circumference --      Peak Flow --      Pain Score 03/19/17 1405 8     Pain Loc --      Pain Edu? --      Excl. in [place]? --     Constitutional: Alert and oriented. Well appearing and in no distress. Eyes: Normal exam ENT   Head: Normocephalic and atraumatic.   Mouth/Throat: Mucous membranes are moist. Cardiovascular: Regular rhythm with a rate around 120 bpm. No murmur Respiratory: Normal respiratory effort without tachypnea nor retractions. Breath sounds are clear and equal bilaterally. Gastrointestinal: Soft and nontender. No distention. Musculoskeletal: Nontender with normal range of motion in all extremities. No lower extremity tenderness or edema. Neurologic:  Normal speech and language. No gross focal neurologic deficits Skin:  Skin is warm, dry and intact.  Psychiatric: Mood and affect are normal.   ____________________________________________    EKG  EKG reviewed and  interpreted by myself shows sinus tachycardia 117 bpm, narrow QRS, normal axis, normal intervals, nonspecific ST changes.  ____________________________________________    RADIOLOGY  IMPRESSION: Stable appearance of LEFT midlung zone cavitary mass.  ____________________________________________   INITIAL IMPRESSION / ASSESSMENT AND PLAN / ED COURSE  Pertinent labs & imaging results that were available during my care of the patient were reviewed by me and considered in my medical decision making (see chart for details).  Patient presents to the emergency department after a near syncopal episode. Currently the patient appears well, no distress however she is tachycardic from 120 bpm. Denies any chest pain. Denies any pleuritic pain. Denies any leg pain or swelling. Patient was recently diagnosed with a left-sided cavitary lung lesion ultimately thought to be infectious. Negative TB workup. We will check labs, IV hydrate and closely monitor. Patient's EKG shows tachycardia but otherwise no concerning findings. Chest x-ray is unchanged.  Patient's labs are largely within normal limits besides a mild leukocytosis.  Patient's daughter is now here as well as the hospice nurse. Patient was placed into hospice care earlier today. Daughter states they have been doing well while the hospice nurse was visiting however as soon as the hospice nurse left she states the patient had a panic attack and felt dizzy so she called EMS. The hospice nurse is here and has talked to the patient and the daughter about various resources including calling the hospice nurse back for an evaluation. Currently the patient appears well, continues asked to be discharged home. Patient's labs show a mild leukocytosis are otherwise unchanged. X-ray is unchanged. Patient receiving IV fluids, but her heart rate is declining with IV fluids. Denies any shortness of breath at this time. Denies any chest pain at any point.  Patient's  daughter is here. They are asking to be discharged home. Patient's labs show a mild increase in white blood cell count otherwise unchanged. We will discharge the patient home. The hospice nurse will check on the patient tomorrow. ____________________________________________   FINAL CLINICAL IMPRESSION(S) / ED DIAGNOSES  near syncope    Harvest Dark, MD 03/19/17 1712

## 2017-03-19 NOTE — ED Triage Notes (Signed)
Pt to ED c/o palpitations, chest pain and SOB last week.  Pt brought in by EMS from home.  EMS states 130 HR, 100% RA, 140/93.  Pt with hx of lung cancer, met with hospice today.

## 2017-03-19 NOTE — ED Notes (Signed)
Unable to print labels from [facility]; getting an error message that says "Lock Failed: patient is being processed"; called Lab to inform them and get an ok to send blood with white labels.

## 2017-03-19 NOTE — Progress Notes (Signed)
ED visit made. Patient was just opened to [facility], services this afternoon with a hospice diagnosis of lung cancer. She is a FULL code. She was brought to the ED via EMS for evaluation of dyspnea.  Patient seen sitting up on the ED stretcher, alert, oriented to self and familiars. She reports she "fell and her cousin saw her from across the street". She wants to go home.  Writer spoke to patient's daughter and primary caregiver Yohanna Tow [phone]) who reports that "right after the hospice nurse left Momma had a panic attack and wanted to go to the ER". She stated she did not know if she could call the hospice nurse right back. Writer assured her that yes, the hospice nurse could come back. Writer also asked about her mother's antianxiety medications as she was just discharged from [facility] yesterday evening and does take xanax. [name] stated that "I do try to give her all of her medications both scheduled and as needed, but sometimes when she gets like that she wont take anything " . Writer provided reassurance and support regarding the difficulty of care giving. [name] is on her way to the ED and plans to take her mother back home via [place]. Discussed with attending physician Dr. Kerman Passey, plan is for patient to discharge. Lab results pending. No acute changes noted in CXR. Hospice team updated. Patient requested a drink and was given sprite, she was calm and appreciative. [name] RN, BSN, [place] [facility] [place], [facility] [phone] c

## 2017-03-19 NOTE — ED Notes (Signed)
Received a call from lab stating blood needs to be redrawn; blood was hemolyzed and clotted; calling lab to see if they can come and draw blood; Lab confirmed that phlebotomist will come and draw blood from patient.

## 2017-03-20 NOTE — Telephone Encounter (Signed)
Patient asleep per family.  LM to call office .

## 2017-03-23 ENCOUNTER — Encounter: Payer: Self-pay | Admitting: Internal Medicine

## 2017-03-23 NOTE — Telephone Encounter (Signed)
No ans no vm   Mailed appointment reminder letter with instructions to [facility] mall prior to appt for cxr.

## 2017-03-25 ENCOUNTER — Emergency Department: Payer: Medicaid Other

## 2017-03-25 ENCOUNTER — Emergency Department
Admission: EM | Admit: 2017-03-25 | Discharge: 2017-03-26 | Disposition: A | Discharge status: Home / Self Care | Payer: Medicaid Other | Attending: Student in an Organized Health Care Education/Training Program | Admitting: Student in an Organized Health Care Education/Training Program

## 2017-03-25 ENCOUNTER — Encounter: Payer: Self-pay | Admitting: Emergency Medicine

## 2017-03-25 DIAGNOSIS — F17210 Nicotine dependence, cigarettes, uncomplicated: Secondary | ICD-10-CM | POA: Insufficient documentation

## 2017-03-25 DIAGNOSIS — I10 Essential (primary) hypertension: Secondary | ICD-10-CM | POA: Insufficient documentation

## 2017-03-25 DIAGNOSIS — Z79899 Other long term (current) drug therapy: Secondary | ICD-10-CM | POA: Insufficient documentation

## 2017-03-25 DIAGNOSIS — J449 Chronic obstructive pulmonary disease, unspecified: Secondary | ICD-10-CM | POA: Insufficient documentation

## 2017-03-25 DIAGNOSIS — Z85118 Personal history of other malignant neoplasm of bronchus and lung: Secondary | ICD-10-CM | POA: Insufficient documentation

## 2017-03-25 DIAGNOSIS — F419 Anxiety disorder, unspecified: Secondary | ICD-10-CM | POA: Diagnosis not present

## 2017-03-25 DIAGNOSIS — R0602 Shortness of breath: Secondary | ICD-10-CM

## 2017-03-25 LAB — BASIC METABOLIC PANEL
ANION GAP: 11 (ref 5–15)
BUN: 6 mg/dL (ref 6–20)
CO2: 26 mmol/L (ref 22–32)
Calcium: 9.7 mg/dL (ref 8.9–10.3)
Chloride: 100 mmol/L — ABNORMAL LOW (ref 101–111)
Creatinine, Ser: 0.47 mg/dL (ref 0.44–1.00)
GFR calc Af Amer: >60 mL/min (ref >60)
GLUCOSE: 94 mg/dL (ref 65–99)
POTASSIUM: 3.1 mmol/L — AB (ref 3.5–5.1)
Sodium: 137 mmol/L (ref 135–145)

## 2017-03-25 LAB — CBC WITH DIFFERENTIAL/PLATELET
BASOS ABS: 0 10*3/uL (ref 0–0.1)
Basophils Relative: 0 %
EOS PCT: 3 %
Eosinophils Absolute: 0.3 10*3/uL (ref 0–0.7)
HEMATOCRIT: 33.2 % — AB (ref 35.0–47.0)
HEMOGLOBIN: 11.1 g/dL — AB (ref 12.0–16.0)
LYMPHS PCT: 14 %
Lymphs Abs: 1.5 10*3/uL (ref 1.0–3.6)
MCH: 29.5 pg (ref 26.0–34.0)
MCHC: 33.4 g/dL (ref 32.0–36.0)
MCV: 88.3 fL (ref 80.0–100.0)
Monocytes Absolute: 1 10*3/uL — ABNORMAL HIGH (ref 0.2–0.9)
Monocytes Relative: 10 %
NEUTROS ABS: 7.6 10*3/uL — AB (ref 1.4–6.5)
NEUTROS PCT: 73 %
PLATELETS: 486 10*3/uL — AB (ref 150–440)
RBC: 3.76 MIL/uL — AB (ref 3.80–5.20)
RDW: 14.7 % — ABNORMAL HIGH (ref 11.5–14.5)
WBC: 10.5 10*3/uL (ref 3.6–11.0)

## 2017-03-25 LAB — TROPONIN I: Troponin I: <0.03 ng/mL (ref <0.03)

## 2017-03-25 MED ORDER — POTASSIUM CHLORIDE CRYS ER 20 MEQ PO TBCR
40.0000 meq | EXTENDED_RELEASE_TABLET | Freq: Once | ORAL | Status: AC
Start: 2017-03-25 — End: 2017-03-25
  Administered 2017-03-25: 40 meq via ORAL
  Filled 2017-03-25: qty 2

## 2017-03-25 MED ORDER — SODIUM CHLORIDE 0.9 % IV BOLUS (SEPSIS)
1000.0000 mL | Freq: Once | INTRAVENOUS | Status: AC
  Administered 2017-03-25: 1000 mL via INTRAVENOUS

## 2017-03-25 MED ORDER — SODIUM CHLORIDE 0.9 % IV BOLUS (SEPSIS): 500.0000 mL | Freq: Once | INTRAVENOUS | Status: DC

## 2017-03-25 MED ORDER — ONDANSETRON HCL 4 MG/2ML IJ SOLN: 4.0000 mg | Freq: Once | INTRAMUSCULAR | Status: DC

## 2017-03-25 MED ORDER — MORPHINE SULFATE (PF) 4 MG/ML IV SOLN: 4.0000 mg | INTRAVENOUS | Status: DC | PRN

## 2017-03-25 MED ORDER — LORAZEPAM 1 MG PO TABS
1.0000 mg | ORAL_TABLET | Freq: Once | ORAL | Status: AC
  Administered 2017-03-25: 1 mg via ORAL
  Filled 2017-03-25: qty 1

## 2017-03-25 MED ORDER — KETOROLAC TROMETHAMINE 30 MG/ML IJ SOLN: 15.0000 mg | Freq: Once | INTRAMUSCULAR | Status: DC

## 2017-03-25 NOTE — ED Provider Notes (Addendum)
[facility] Emergency Department Provider Note    None    (approximate)  I have reviewed the triage vital signs and the nursing notes.   HISTORY  Chief Complaint Shortness of Breath and Anxiety    HPI Audrey Mcgee is a 56 y.o. female with end-stage COPD lung cancer and recent diagnosis with pneumonia presents to the ER with 1 day of shortness of breath and jittery feeling. Patient without any fevers. No worsening shortness of breath. Denies any chest pain. Has been taking her medications as directed. States that she's felt this way before. Patient was recently placed on hospice. Denies any other complaints.   Past Medical History:  Diagnosis Date  . COPD (chronic obstructive pulmonary disease) ([place])   . Dementia   . Hypertension   . Lung cancer ([place])   . Thyroid disease    Family History  Problem Relation Age of Onset  . Hypertension Father   . Cancer Father   . Diabetes Father   . Cancer Maternal Aunt    Past Surgical History:  Procedure Laterality Date  . CESAREAN SECTION CLASSICAL    . STOMACH SURGERY     Pt reports for a tumor   Patient Active Problem List   Diagnosis Date Noted  . HCAP (healthcare-associated pneumonia) 03/12/2017  . Hyperthyroidism 03/02/2017  . COPD (chronic obstructive pulmonary disease) ([place]) 03/02/2017  . Subacute delirium 02/18/2017  . Altered mental status 02/12/2017  . Cannabis abuse 02/12/2017  . Nausea 02/12/2017  . Failure to thrive in adult 02/12/2017  . Hypokalemia 02/12/2017  . Generalized weakness 02/12/2017  . Lung cancer ([place]) 02/12/2017  . Hypothyroidism 02/11/2017  . Malignancy ([place])   . Palliative care by specialist   . Goals of care, counseling/discussion   . DNR (do not resuscitate) discussion   . Depressive disorder   . Dehydration 02/07/2017  . Weight loss 02/07/2017  . Encounter for antineoplastic chemotherapy 02/01/2017  . 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  Sepsis ([place]) 11/07/2016  . Protein-calorie malnutrition, severe 11/07/2016  . Acute respiratory distress 06/21/2016  . COPD exacerbation ([place]) 06/21/2016  . Sinus tachycardia 06/21/2016  . Recurrent aspiration bronchitis/pneumonia ([place]) 06/21/2016  . Dysphagia 06/21/2016  . Nausea and vomiting 06/21/2016  . Dental abscess 05/10/2016  . Swallowing difficulty 03/28/2016  . Hypomagnesemia 02/29/2016  . Hoarseness 02/18/2016  . Cancer related pain 07/08/2015  . Adenocarcinoma, lung ([place]) 03/26/2015  . 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 Ear ache 10/03/2014  . Abscess of buccal cavity 10/03/2014  . Lump in neck 10/03/2014  . Laryngeal pain 10/03/2014      Prior to Admission medications   Medication Sig Start Date End Date Taking? Authorizing Provider  ALPRAZolam (XANAX) 0.25 MG tablet Take 1 tablet (0.25 mg total) by mouth 3 (three) times daily as needed for anxiety. 03/05/17  Yes [name], MD  amoxicillin-clavulanate (AUGMENTIN ES-600) 600-42.9 MG/5ML suspension Take 5 mLs (600 mg total) by mouth 2 (two) times daily. 03/18/17 04/01/17 Yes [name], MD  docusate sodium (COLACE) 100 MG capsule Take 100 mg by mouth 2 (two) times daily as needed for mild constipation.   Yes Historical Provider, MD  feeding supplement (BOOST / RESOURCE BREEZE) LIQD Take 1 Container by mouth 2 (two) times daily between meals. 02/12/17  Yes [name], MD  folic acid (FOLVITE) 595 MCG tablet Take 800 mcg by mouth daily.    Yes Historical Provider, MD  ipratropium-albuterol (DUONEB) 0.5-2.5 (  3) MG/3ML SOLN Take 3 mLs by nebulization every 4 (four) hours. 02/12/17  Yes [name], MD  levothyroxine (SYNTHROID, LEVOTHROID) 100 MCG tablet Take 100 mcg by mouth daily before breakfast.  02/24/17  Yes Historical Provider, MD  lidocaine (XYLOCAINE) 2 % solution Use as directed 10 mLs in the mouth or throat every 4 (four) hours as needed for mouth pain.   Yes Historical  Provider, MD  magnesium oxide (MAG-OX) 400 MG tablet Take 1 tablet (400 mg total) by mouth 2 (two) times daily. 03/04/17  Yes [name], MD  megestrol (MEGACE) 40 MG/ML suspension Take 10 mLs (400 mg total) by mouth 2 (two) times daily. 02/02/17  Yes Lequita Asal, MD  methimazole (TAPAZOLE) 5 MG tablet Take 1 tablet (5 mg total) by mouth daily. 03/19/17  Yes [name], MD  metoprolol (LOPRESSOR) 50 MG tablet Take 0.5 tablets (25 mg total) by mouth 2 (two) times daily. 03/04/17  Yes [name], MD  Morphine Sulfate (MORPHINE CONCENTRATE) 10 mg / 0.5 ml concentrated solution Take 0.5 mLs (10 mg total) by mouth every 4 (four) hours as needed for severe pain. 03/05/17  Yes [name], MD  omeprazole (PRILOSEC) 20 MG capsule Take 1 capsule (20 mg total) by mouth daily. 06/20/16  Yes Lequita Asal, MD  ondansetron (ZOFRAN) 4 MG tablet Take 1 tablet (4 mg total) by mouth every 6 (six) hours as needed for nausea. 02/12/17  Yes [name], MD  potassium chloride 20 MEQ TBCR Take 1 tab twice a day 03/04/17  Yes [name], MD  RA VITAMIN B-1 100 MG TABS Take 100 mg by mouth daily. 02/23/17  Yes Historical Provider, MD  senna (SENOKOT) 8.6 MG TABS tablet Take 1 tablet (8.6 mg total) by mouth daily. 02/13/17  Yes [name], MD  thiamine (VITAMIN B-1) 100 MG tablet Take 1 tablet (100 mg total) by mouth daily. 02/21/17  Yes [name], MD  tiotropium (SPIRIVA) 18 MCG inhalation capsule Place 1 capsule (18 mcg total) into inhaler and inhale daily. 06/22/16  Yes [name], MD  traZODone (DESYREL) 50 MG tablet Take 1 tablet (50 mg total) by mouth at bedtime as needed for sleep. 02/21/17  Yes [name], MD    Allergies No known allergies    Social History Social History  Substance Use Topics  . Smoking status: Current Every Day Smoker    Packs/day: 0.25    Years: 15.00    Types: Cigarettes  . Smokeless tobacco: Never Used  . Alcohol use No    Review of  Systems Patient denies headaches, rhinorrhea, blurry vision, numbness, shortness of breath, chest pain, edema, cough, abdominal pain, nausea, vomiting, diarrhea, dysuria, fevers, rashes or hallucinations unless otherwise stated above in HPI. ____________________________________________   PHYSICAL EXAM:  VITAL SIGNS: Vitals:   03/25/17 2130 03/25/17 2200  BP: (!) 139/97 (!) 142/100  Pulse: (!) 111 (!) 108  Resp: (!) 21 (!) 22  Temp:      Constitutional: Alert chronically ill appearing but in NAD.  Eyes: Conjunctivae are normal. PERRL. EOMI. Head: Atraumatic. Nose: No congestion/rhinnorhea. Mouth/Throat: Mucous membranes are moist.  Oropharynx non-erythematous. Neck: No stridor. Painless ROM. No cervical spine tenderness to palpation Hematological/Lymphatic/Immunilogical: No cervical lymphadenopathy. Cardiovascular: mildly tachycardic rate, regular rhythm. Grossly normal heart sounds.  Good peripheral circulation. Respiratory: Normal respiratory effort.  No retractions. Lungs with coarse breathsounds throughout Gastrointestinal: Soft and nontender. No distention. No abdominal bruits. No CVA tenderness. Musculoskeletal: No lower extremity  tenderness nor edema.  No joint effusions. Neurologic:  Normal speech and language. No gross focal neurologic deficits are appreciated. No gait instability. Skin:  Skin is warm, dry and intact. No rash noted. Psychiatric: very anxious appearing  ____________________________________________   LABS (all labs ordered are listed, but only abnormal results are displayed)  Results for orders placed or performed during the hospital encounter of 03/25/17 (from the past 24 hour(s))  CBC with Differential/Platelet     Status: Abnormal   Collection Time: 03/25/17  8:51 PM  Result Value Ref Range   WBC 10.5 3.6 - 11.0 K/uL   RBC 3.76 (L) 3.80 - 5.20 MIL/uL   Hemoglobin 11.1 (L) 12.0 - 16.0 g/dL   HCT 33.2 (L) 35.0 - 47.0 %   MCV 88.3 80.0 - 100.0 fL    MCH 29.5 26.0 - 34.0 pg   MCHC 33.4 32.0 - 36.0 g/dL   RDW 14.7 (H) 11.5 - 14.5 %   Platelets 486 (H) 150 - 440 K/uL   Neutrophils Relative % 73 %   Neutro Abs 7.6 (H) 1.4 - 6.5 K/uL   Lymphocytes Relative 14 %   Lymphs Abs 1.5 1.0 - 3.6 K/uL   Monocytes Relative 10 %   Monocytes Absolute 1.0 (H) 0.2 - 0.9 K/uL   Eosinophils Relative 3 %   Eosinophils Absolute 0.3 0 - 0.7 K/uL   Basophils Relative 0 %   Basophils Absolute 0.0 0 - 0.1 K/uL  Basic metabolic panel     Status: Abnormal   Collection Time: 03/25/17  8:51 PM  Result Value Ref Range   Sodium 137 135 - 145 mmol/L   Potassium 3.1 (L) 3.5 - 5.1 mmol/L   Chloride 100 (L) 101 - 111 mmol/L   CO2 26 22 - 32 mmol/L   Glucose, Bld 94 65 - 99 mg/dL   BUN 6 6 - 20 mg/dL   Creatinine, Ser 0.47 0.44 - 1.00 mg/dL   Calcium 9.7 8.9 - 10.3 mg/dL   GFR calc non Af Amer >60 >60 mL/min   GFR calc Af Amer >60 >60 mL/min   Anion gap 11 5 - 15  Troponin I     Status: None   Collection Time: 03/25/17  8:51 PM  Result Value Ref Range   Troponin I <0.03 <0.03 ng/mL   ____________________________________________  EKG My review and personal interpretation at Time: 20:33   Indication: weakness,   Rate: 115  Rhythm: sinus Axis: normal Other: nonspecific st changes, no acute ischemia ____________________________________________  RADIOLOGY  I personally reviewed all radiographic images ordered to evaluate for the above acute complaints and reviewed radiology reports and findings.  These findings were personally discussed with the patient.  Please see medical record for radiology report.  ____________________________________________   PROCEDURES  Procedure(s) performed:  Procedures    Critical Care performed: no ____________________________________________   INITIAL IMPRESSION / ASSESSMENT AND PLAN / ED COURSE  Pertinent labs & imaging results that were available during my care of the patient were reviewed by me and considered  in my medical decision making (see chart for details).  DDX: Asthma, copd, CHF, pna, ptx, malignancy, anemia   Desiraye Rolfson is a 57 y.o. who presents with shortness of breath and what appears to be an anxiety attack. EKG shows a mild tachycardia but no acute ischemic changes. Chest x-ray without any acute abnormalities. That it appears that her pneumonia is resolving. Blood work reassuring with exception of mild hypokalemia. Do not feel additional  antibiotics indicated. She has no hypoxia. Her troponin is negative. Symptoms improved with Ativan. Patient is on hospice and has adequate follow-up. Do not feel patient requires admission to hospital at this time.  Have discussed with the patient and available family all diagnostics and treatments performed thus far and all questions were answered to the best of my ability. The patient demonstrates understanding and agreement with plan.      ____________________________________________   FINAL CLINICAL IMPRESSION(S) / ED DIAGNOSES  Final diagnoses:  Shortness of breath  Anxiety      NEW MEDICATIONS STARTED DURING THIS VISIT:  New Prescriptions   No medications on file     Note:  This document was prepared using Dragon voice recognition software and may include unintentional dictation errors.    Merlyn Lot, MD 03/25/17 [id number]    Merlyn Lot, MD 03/25/17 [phone]

## 2017-03-25 NOTE — ED Triage Notes (Signed)
Pt presents to ED with c/o SOB and anxiety all day today.

## 2017-03-27 ENCOUNTER — Emergency Department
Admission: EM | Admit: 2017-03-27 | Discharge: 2017-03-27 | Disposition: A | Discharge status: Home / Self Care | Payer: Medicaid Other | Attending: Emergency Medicine | Admitting: Emergency Medicine

## 2017-03-27 ENCOUNTER — Encounter: Payer: Self-pay | Admitting: Emergency Medicine

## 2017-03-27 ENCOUNTER — Emergency Department: Payer: Medicaid Other

## 2017-03-27 DIAGNOSIS — F17210 Nicotine dependence, cigarettes, uncomplicated: Secondary | ICD-10-CM | POA: Diagnosis not present

## 2017-03-27 DIAGNOSIS — C3490 Malignant neoplasm of unspecified part of unspecified bronchus or lung: Secondary | ICD-10-CM | POA: Insufficient documentation

## 2017-03-27 DIAGNOSIS — I10 Essential (primary) hypertension: Secondary | ICD-10-CM | POA: Insufficient documentation

## 2017-03-27 DIAGNOSIS — M546 Pain in thoracic spine: Secondary | ICD-10-CM | POA: Diagnosis not present

## 2017-03-27 DIAGNOSIS — J449 Chronic obstructive pulmonary disease, unspecified: Secondary | ICD-10-CM | POA: Insufficient documentation

## 2017-03-27 DIAGNOSIS — E039 Hypothyroidism, unspecified: Secondary | ICD-10-CM | POA: Diagnosis not present

## 2017-03-27 LAB — CBC
HEMATOCRIT: 36.6 % (ref 35.0–47.0)
Hemoglobin: 12.1 g/dL (ref 12.0–16.0)
MCH: 29.6 pg (ref 26.0–34.0)
MCHC: 33.1 g/dL (ref 32.0–36.0)
MCV: 89.5 fL (ref 80.0–100.0)
PLATELETS: 454 10*3/uL — AB (ref 150–440)
RBC: 4.1 MIL/uL (ref 3.80–5.20)
RDW: 14.7 % — AB (ref 11.5–14.5)
WBC: 8.6 10*3/uL (ref 3.6–11.0)

## 2017-03-27 LAB — BASIC METABOLIC PANEL
Anion gap: 11 (ref 5–15)
BUN: 6 mg/dL (ref 6–20)
CO2: 25 mmol/L (ref 22–32)
CREATININE: 0.55 mg/dL (ref 0.44–1.00)
Calcium: 9.8 mg/dL (ref 8.9–10.3)
Chloride: 103 mmol/L (ref 101–111)
GFR calc Af Amer: >60 mL/min (ref >60)
Glucose, Bld: 90 mg/dL (ref 65–99)
POTASSIUM: 3.6 mmol/L (ref 3.5–5.1)
SODIUM: 139 mmol/L (ref 135–145)

## 2017-03-27 LAB — TROPONIN I: Troponin I: <0.03 ng/mL (ref <0.03)

## 2017-03-27 MED ORDER — OXYCODONE-ACETAMINOPHEN 5-325 MG PO TABS
1.0000 | ORAL_TABLET | Freq: Once | ORAL | Status: DC
  Filled 2017-03-27: qty 1

## 2017-03-27 NOTE — ED Triage Notes (Signed)
Pt to ed via ems from home with c/o back pain that started after she awoke from nap,  Pt reports center of upper back pain worse with movement and palpation.

## 2017-03-27 NOTE — ED Provider Notes (Signed)
[facility] Emergency Department Provider Note  ____________________________________________   First MD Initiated Contact with Patient 03/27/17 1519     (approximate)  I have reviewed the triage vital signs and the nursing notes.   HISTORY  Chief Complaint Back Pain   HPI Audrey Mcgee is a 56 y.o. female with a history of COPD, lung cancer on hospice who is presenting to the emergency department today with upper back pain. She said that the back pain started when she woke up at about 3 AM this morning. She says it is an 8 out of 10 and worse with movements. She denies any injury. Is also planning upper chest pain but says is a constant chest pain that she has with her COPD and cancer and is unchanged. It is mild amount of cramping quality. She denies any increase in her chest or back pain with deep breathing.   Past Medical History:  Diagnosis Date  . COPD (chronic obstructive pulmonary disease) ([place])   . Dementia   . Hypertension   . Lung cancer ([place])   . Thyroid disease     Patient Active Problem List   Diagnosis Date Noted  . HCAP (healthcare-associated pneumonia) 03/12/2017  . Hyperthyroidism 03/02/2017  . COPD (chronic obstructive pulmonary disease) ([place]) 03/02/2017  . Subacute delirium 02/18/2017  . Altered mental status 02/12/2017  . Cannabis abuse 02/12/2017  . Nausea 02/12/2017  . Failure to thrive in adult 02/12/2017  . Hypokalemia 02/12/2017  . Generalized weakness 02/12/2017  . Lung cancer ([place]) 02/12/2017  . Hypothyroidism 02/11/2017  . Malignancy ([place])   . Palliative care by specialist   . Goals of care, counseling/discussion   . DNR (do not resuscitate) discussion   . Depressive disorder   . Dehydration 02/07/2017  . Weight loss 02/07/2017  . Encounter for antineoplastic chemotherapy 02/01/2017  . 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 Sepsis ([place]) 11/07/2016  . Protein-calorie malnutrition, severe  11/07/2016  . Acute respiratory distress 06/21/2016  . COPD exacerbation ([place]) 06/21/2016  . Sinus tachycardia 06/21/2016  . Recurrent aspiration bronchitis/pneumonia ([place]) 06/21/2016  . Dysphagia 06/21/2016  . Nausea and vomiting 06/21/2016  . Dental abscess 05/10/2016  . Swallowing difficulty 03/28/2016  . Hypomagnesemia 02/29/2016  . Hoarseness 02/18/2016  . Cancer related pain 07/08/2015  . Adenocarcinoma, lung ([place]) 03/26/2015  . 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 Ear ache 10/03/2014  . Abscess of buccal cavity 10/03/2014  . Lump in neck 10/03/2014  . Laryngeal pain 10/03/2014    Past Surgical History:  Procedure Laterality Date  . CESAREAN SECTION CLASSICAL    . STOMACH SURGERY     Pt reports for a tumor    Prior to Admission medications   Medication Sig Start Date End Date Taking? Authorizing Provider  ALPRAZolam (XANAX) 0.25 MG tablet Take 1 tablet (0.25 mg total) by mouth 3 (three) times daily as needed for anxiety. 03/05/17   [name], MD  amoxicillin-clavulanate (AUGMENTIN ES-600) 600-42.9 MG/5ML suspension Take 5 mLs (600 mg total) by mouth 2 (two) times daily. 03/18/17 04/01/17  [name], MD  docusate sodium (COLACE) 100 MG capsule Take 100 mg by mouth 2 (two) times daily as needed for mild constipation.    Historical Provider, MD  feeding supplement (BOOST / RESOURCE BREEZE) LIQD Take 1 Container by mouth 2 (two) times daily between meals. 02/12/17   [name], MD  folic acid (FOLVITE) 951 MCG tablet Take 800 mcg by mouth  daily.     Historical Provider, MD  ipratropium-albuterol (DUONEB) 0.5-2.5 (3) MG/3ML SOLN Take 3 mLs by nebulization every 4 (four) hours. 02/12/17   [name], MD  levothyroxine (SYNTHROID, LEVOTHROID) 100 MCG tablet Take 100 mcg by mouth daily before breakfast.  02/24/17   Historical Provider, MD  lidocaine (XYLOCAINE) 2 % solution Use as directed 10 mLs in the mouth or throat every 4 (four) hours as  needed for mouth pain.    Historical Provider, MD  magnesium oxide (MAG-OX) 400 MG tablet Take 1 tablet (400 mg total) by mouth 2 (two) times daily. 03/04/17   [name], MD  megestrol (MEGACE) 40 MG/ML suspension Take 10 mLs (400 mg total) by mouth 2 (two) times daily. 02/02/17   Lequita Asal, MD  methimazole (TAPAZOLE) 5 MG tablet Take 1 tablet (5 mg total) by mouth daily. 03/19/17   [name], MD  metoprolol (LOPRESSOR) 50 MG tablet Take 0.5 tablets (25 mg total) by mouth 2 (two) times daily. 03/04/17   [name], MD  Morphine Sulfate (MORPHINE CONCENTRATE) 10 mg / 0.5 ml concentrated solution Take 0.5 mLs (10 mg total) by mouth every 4 (four) hours as needed for severe pain. 03/05/17   [name], MD  omeprazole (PRILOSEC) 20 MG capsule Take 1 capsule (20 mg total) by mouth daily. 06/20/16   Lequita Asal, MD  ondansetron (ZOFRAN) 4 MG tablet Take 1 tablet (4 mg total) by mouth every 6 (six) hours as needed for nausea. 02/12/17   [name], MD  potassium chloride 20 MEQ TBCR Take 1 tab twice a day 03/04/17   [name], MD  RA VITAMIN B-1 100 MG TABS Take 100 mg by mouth daily. 02/23/17   Historical Provider, MD  senna (SENOKOT) 8.6 MG TABS tablet Take 1 tablet (8.6 mg total) by mouth daily. 02/13/17   [name], MD  thiamine (VITAMIN B-1) 100 MG tablet Take 1 tablet (100 mg total) by mouth daily. 02/21/17   [name], MD  tiotropium (SPIRIVA) 18 MCG inhalation capsule Place 1 capsule (18 mcg total) into inhaler and inhale daily. 06/22/16   [name], MD  traZODone (DESYREL) 50 MG tablet Take 1 tablet (50 mg total) by mouth at bedtime as needed for sleep. 02/21/17   [name], MD    Allergies No known allergies  Family History  Problem Relation Age of Onset  . Hypertension Father   . Cancer Father   . Diabetes Father   . Cancer Maternal Aunt     Social History Social History  Substance Use Topics  . Smoking status: Current Every Day Smoker      Packs/day: 0.25    Years: 15.00    Types: Cigarettes  . Smokeless tobacco: Never Used  . Alcohol use No    Review of Systems Constitutional: No fever/chills Eyes: No visual changes. ENT: No sore throat. Cardiovascular: as above Respiratory: Denies shortness of breath. Gastrointestinal: No abdominal pain.  No nausea, no vomiting.   Genitourinary: Negative for dysuria. Musculoskeletal: as above Skin: Negative for rash. Neurological: Negative for headaches, focal weakness or numbness.  10-point ROS otherwise negative.  ____________________________________________   PHYSICAL EXAM:  VITAL SIGNS: ED Triage Vitals  Enc Vitals Group     BP 03/27/17 1444 (!) 144/104     Pulse Rate 03/27/17 1444 (!) 127     Resp 03/27/17 1444 18     Temp 03/27/17 1444 98 F (36.7 C)  Temp Source 03/27/17 1444 Oral     SpO2 03/27/17 1444 100 %     Weight 03/27/17 1434 115 lb (52.2 kg)     Height --      Head Circumference --      Peak Flow --      Pain Score 03/27/17 1433 8     Pain Loc --      Pain Edu? --      Excl. in [place]? --     Constitutional: Alert and oriented. Well appearing and in no acute distress. Eyes: Conjunctivae are normal. PERRL. EOMI. Head: Atraumatic. Nose: No congestion/rhinnorhea. Mouth/Throat: Mucous membranes are moist.   Neck: No stridor.   Cardiovascular:tachycardic, regular rhythm. Grossly normal heart sounds.   Respiratory: Normal respiratory effort.  No retractions. Lungs CTAB. Gastrointestinal: Soft and nontender. No distention.  Musculoskeletal: No lower extremity tenderness nor edema.  No joint effusions. No deformity or step-off to midline thoracic or lumbar spines. Tenderness palpation to the right parasternal muscle groups. No induration, erythema or pus. No deformity. No crepitus or ecchymosis. Neurologic:  Normal speech and language. No gross focal neurologic deficits are appreciated.  Skin:  Skin is warm, dry and intact. No rash  noted. Psychiatric: Mood and affect are normal. Speech and behavior are normal.  ____________________________________________   LABS (all labs ordered are listed, but only abnormal results are displayed)  Labs Reviewed  CBC - Abnormal; Notable for the following:       Result Value   RDW 14.7 (*)    Platelets 454 (*)    All other components within normal limits  BASIC METABOLIC PANEL  TROPONIN I   ____________________________________________  EKG  ED ECG REPORT I, Aroura Vasudevan,  Youlanda Roys, the attending physician, personally viewed and interpreted this ECG.   Date: 03/27/2017  EKG Time: 1451  Rate: 131  Rhythm: sinus tachycardia  Axis: normal  Intervals:none  ST&T Change: No ST segment elevation or depression. No abnormal T-wave inversion.  ____________________________________________  [id number]  DG Chest 1 View (Final result)  Result time 03/27/17 16:40:26  Final result by Orlean Patten, MD (03/27/17 16:40:26)           Narrative:   CLINICAL DATA: Back pain which began today after a nap. No known injury.  EXAM: CHEST 1 VIEW  COMPARISON: PA and lateral chest 03/25/2017 and 03/19/2017. CT chest 03/12/2017.  FINDINGS: Small cavitary lesion in the left upper lobe is again seen. No consolidative process, pneumothorax or effusion. Heart size is normal. Asymmetric elevation of the left hemidiaphragm relative to the right is seen.  IMPRESSION: No acute disease.  Small cavitary lesion left upper lobe as seen on prior exams.   Electronically Signed By: Inge Rise M.D. On: 03/27/2017 16:40            ____________________________________________   PROCEDURES  Procedure(s) performed:   Procedures  Critical Care performed:  ____________________________________________   INITIAL IMPRESSION / ASSESSMENT AND PLAN / ED COURSE  Pertinent labs & imaging results that were available during my care of the patient were reviewed by me and  considered in my medical decision making (see chart for details).  Evaluated by hospice representative, Santiago Glad.  Agrees with likely discharge to home. Suspicion is musculoskeletal pain. Patient with chronic tachycardia which is unchanged. Pain is highly reproducible to palpation and is not pleuritic. Unlikely to be PE.    ----------------------------------------- 5:52 PM on 03/27/2017 -----------------------------------------  Patient resting comfortably at this time. Heart rate in the 1 teens but  this appears to be her baseline. Exam largely points to musculoskeletal etiology. Patient has pain medication at home. Reassuring lab work as well as chest x-ray.  Explained the plan and the patient and she is understanding of bleeding to comply.  ____________________________________________   FINAL CLINICAL IMPRESSION(S) / ED DIAGNOSES  Back pain.    NEW MEDICATIONS STARTED DURING THIS VISIT:  New Prescriptions   No medications on file     Note:  This document was prepared using Dragon voice recognition software and may include unintentional dictation errors.    Orbie Pyo, MD 03/27/17 [phone]

## 2017-03-27 NOTE — ED Notes (Signed)
Pt verbalized understanding of discharge instructions. NAD at this time. 

## 2017-03-27 NOTE — Progress Notes (Signed)
ED visit made.Patient is currently followed by [facility] with a hospice diagnosis of Lung Cancer. She is a Full Code. She was sent to the ED via EMS today for evaluation of back pain. Patient seen sitting up on the ED stretcher, tearful requesting to go home. She reports she does not know "why I am here, EMS just came and brought me here". Patient requested a Sprite and wanted to go to the cafeteria to get something to drink. She said she was cold. Writer provided a warm blanket and a glass of Sprite. Attending physician Dr Clearnce Hasten made aware that patient is a current hospice patient. She is cared for by her daughter [name]. No family at bedside a during visit. Hospice team updated. Per conversation with Dr. Clearnce Hasten patient will most likely discharged back home as there are currently no acute findings. Thank you. [name] RN, BSN, [facility], [facility] [phone] c

## 2017-04-09 ENCOUNTER — Emergency Department: Payer: Medicaid Other

## 2017-04-09 ENCOUNTER — Emergency Department
Admission: EM | Admit: 2017-04-09 | Discharge: 2017-04-10 | Disposition: A | Discharge status: Home / Self Care | Payer: Medicaid Other | Attending: Emergency Medicine | Admitting: Emergency Medicine

## 2017-04-09 DIAGNOSIS — F17210 Nicotine dependence, cigarettes, uncomplicated: Secondary | ICD-10-CM | POA: Insufficient documentation

## 2017-04-09 DIAGNOSIS — M546 Pain in thoracic spine: Secondary | ICD-10-CM | POA: Diagnosis not present

## 2017-04-09 DIAGNOSIS — R079 Chest pain, unspecified: Secondary | ICD-10-CM | POA: Diagnosis not present

## 2017-04-09 DIAGNOSIS — J449 Chronic obstructive pulmonary disease, unspecified: Secondary | ICD-10-CM | POA: Insufficient documentation

## 2017-04-09 DIAGNOSIS — I10 Essential (primary) hypertension: Secondary | ICD-10-CM | POA: Insufficient documentation

## 2017-04-09 DIAGNOSIS — Z85118 Personal history of other malignant neoplasm of bronchus and lung: Secondary | ICD-10-CM | POA: Diagnosis not present

## 2017-04-09 LAB — CBC
HCT: 39.4 % (ref 35.0–47.0)
HEMOGLOBIN: 13.2 g/dL (ref 12.0–16.0)
MCH: 28.5 pg (ref 26.0–34.0)
MCHC: 33.6 g/dL (ref 32.0–36.0)
MCV: 84.9 fL (ref 80.0–100.0)
PLATELETS: 297 10*3/uL (ref 150–440)
RBC: 4.64 MIL/uL (ref 3.80–5.20)
RDW: 14.5 % (ref 11.5–14.5)
WBC: 8 10*3/uL (ref 3.6–11.0)

## 2017-04-09 LAB — BASIC METABOLIC PANEL
ANION GAP: 15 (ref 5–15)
BUN: 12 mg/dL (ref 6–20)
CHLORIDE: 97 mmol/L — AB (ref 101–111)
CO2: 27 mmol/L (ref 22–32)
CREATININE: 0.62 mg/dL (ref 0.44–1.00)
Calcium: 10.3 mg/dL (ref 8.9–10.3)
GFR calc non Af Amer: >60 mL/min (ref >60)
Glucose, Bld: 90 mg/dL (ref 65–99)
POTASSIUM: 3 mmol/L — AB (ref 3.5–5.1)
SODIUM: 139 mmol/L (ref 135–145)

## 2017-04-09 LAB — TROPONIN I: Troponin I: <0.03 ng/mL (ref <0.03)

## 2017-04-09 MED ORDER — POTASSIUM CHLORIDE CRYS ER 20 MEQ PO TBCR
40.0000 meq | EXTENDED_RELEASE_TABLET | Freq: Once | ORAL | Status: AC
  Administered 2017-04-09: 40 meq via ORAL
  Filled 2017-04-09: qty 2

## 2017-04-09 NOTE — ED Notes (Signed)
Pt stating "I wanna go home" and crying

## 2017-04-09 NOTE — ED Notes (Signed)
Pt stating that she wants to go home. Sister in law at bedside. Pt stating that she wants to go home with sister in law. Sister in law agrees. Dr. Clearnce Hasten updated and stated that when labs and xray come back and look good pt can go home with sister in law.

## 2017-04-09 NOTE — ED Provider Notes (Signed)
[facility] Emergency Department Provider Note  ____________________________________________   First MD Initiated Contact with Patient 04/09/17 2019     (approximate)  I have reviewed the triage vital signs and the nursing notes.   HISTORY  Chief Complaint Shortness of Breath and Back Pain   HPI Audrey Mcgee is a 56 y.o. female with a history of lung cancer as well as [facility] hospice who is presenting to the emergency department with 1 day of intermittent back pain radiating through to her chest. She says the pain is not brought on by any movement or exertion.  Associated with shortness of breath. Multiple visits in the past for similar complaints. Says the pain is to her bilateral back and radiating through to the center of her chest. Says she is currently in pain.    Past Medical History:  Diagnosis Date  . COPD (chronic obstructive pulmonary disease) ([place])   . Dementia   . Hypertension   . Lung cancer ([place])   . Thyroid disease     Patient Active Problem List   Diagnosis Date Noted  . HCAP (healthcare-associated pneumonia) 03/12/2017  . Hyperthyroidism 03/02/2017  . COPD (chronic obstructive pulmonary disease) ([place]) 03/02/2017  . Subacute delirium 02/18/2017  . Altered mental status 02/12/2017  . Cannabis abuse 02/12/2017  . Nausea 02/12/2017  . Failure to thrive in adult 02/12/2017  . Hypokalemia 02/12/2017  . Generalized weakness 02/12/2017  . Lung cancer ([place]) 02/12/2017  . Hypothyroidism 02/11/2017  . Malignancy ([place])   . Palliative care by specialist   . Goals of care, counseling/discussion   . DNR (do not resuscitate) discussion   . Depressive disorder   . Dehydration 02/07/2017  . Weight loss 02/07/2017  . Encounter for antineoplastic chemotherapy 02/01/2017  . 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 Sepsis ([place]) 11/07/2016  . Protein-calorie malnutrition, severe 11/07/2016  . Acute respiratory distress  06/21/2016  . COPD exacerbation ([place]) 06/21/2016  . Sinus tachycardia 06/21/2016  . Recurrent aspiration bronchitis/pneumonia ([place]) 06/21/2016  . Dysphagia 06/21/2016  . Nausea and vomiting 06/21/2016  . Dental abscess 05/10/2016  . Swallowing difficulty 03/28/2016  . Hypomagnesemia 02/29/2016  . Hoarseness 02/18/2016  . Cancer related pain 07/08/2015  . Adenocarcinoma, lung ([place]) 03/26/2015  . 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 Ear ache 10/03/2014  . Abscess of buccal cavity 10/03/2014  . Lump in neck 10/03/2014  . Laryngeal pain 10/03/2014    Past Surgical History:  Procedure Laterality Date  . CESAREAN SECTION CLASSICAL    . STOMACH SURGERY     Pt reports for a tumor    Prior to Admission medications   Medication Sig Start Date End Date Taking? Authorizing Provider  ALPRAZolam (XANAX) 0.25 MG tablet Take 1 tablet (0.25 mg total) by mouth 3 (three) times daily as needed for anxiety. 03/05/17   [name], MD  docusate sodium (COLACE) 100 MG capsule Take 100 mg by mouth 2 (two) times daily as needed for mild constipation.    Historical Provider, MD  feeding supplement (BOOST / RESOURCE BREEZE) LIQD Take 1 Container by mouth 2 (two) times daily between meals. 02/12/17   [name], MD  folic acid (FOLVITE) 592 MCG tablet Take 800 mcg by mouth daily.     Historical Provider, MD  ipratropium-albuterol (DUONEB) 0.5-2.5 (3) MG/3ML SOLN Take 3 mLs by nebulization every 4 (four) hours. 02/12/17   [name], MD  levothyroxine (SYNTHROID, LEVOTHROID) 100 MCG tablet Take 100  mcg by mouth daily before breakfast.  02/24/17   Historical Provider, MD  lidocaine (XYLOCAINE) 2 % solution Use as directed 10 mLs in the mouth or throat every 4 (four) hours as needed for mouth pain.    Historical Provider, MD  magnesium oxide (MAG-OX) 400 MG tablet Take 1 tablet (400 mg total) by mouth 2 (two) times daily. 03/04/17   [name], MD  megestrol (MEGACE) 40 MG/ML  suspension Take 10 mLs (400 mg total) by mouth 2 (two) times daily. 02/02/17   Lequita Asal, MD  methimazole (TAPAZOLE) 5 MG tablet Take 1 tablet (5 mg total) by mouth daily. 03/19/17   [name], MD  metoprolol (LOPRESSOR) 50 MG tablet Take 0.5 tablets (25 mg total) by mouth 2 (two) times daily. 03/04/17   [name], MD  Morphine Sulfate (MORPHINE CONCENTRATE) 10 mg / 0.5 ml concentrated solution Take 0.5 mLs (10 mg total) by mouth every 4 (four) hours as needed for severe pain. 03/05/17   [name], MD  omeprazole (PRILOSEC) 20 MG capsule Take 1 capsule (20 mg total) by mouth daily. 06/20/16   Lequita Asal, MD  ondansetron (ZOFRAN) 4 MG tablet Take 1 tablet (4 mg total) by mouth every 6 (six) hours as needed for nausea. 02/12/17   [name], MD  potassium chloride 20 MEQ TBCR Take 1 tab twice a day 03/04/17   [name], MD  RA VITAMIN B-1 100 MG TABS Take 100 mg by mouth daily. 02/23/17   Historical Provider, MD  senna (SENOKOT) 8.6 MG TABS tablet Take 1 tablet (8.6 mg total) by mouth daily. 02/13/17   [name], MD  thiamine (VITAMIN B-1) 100 MG tablet Take 1 tablet (100 mg total) by mouth daily. 02/21/17   [name], MD  tiotropium (SPIRIVA) 18 MCG inhalation capsule Place 1 capsule (18 mcg total) into inhaler and inhale daily. 06/22/16   [name], MD  traZODone (DESYREL) 50 MG tablet Take 1 tablet (50 mg total) by mouth at bedtime as needed for sleep. 02/21/17   [name], MD    Allergies No known allergies  Family History  Problem Relation Age of Onset  . Hypertension Father   . Cancer Father   . Diabetes Father   . Cancer Maternal Aunt     Social History Social History  Substance Use Topics  . Smoking status: Current Every Day Smoker    Packs/day: 0.25    Years: 15.00    Types: Cigarettes  . Smokeless tobacco: Never Used  . Alcohol use No    Review of Systems Constitutional: No fever/chills Eyes: No visual changes. ENT: No  sore throat. Cardiovascular: as above Respiratory: as above Gastrointestinal: No abdominal pain.  No nausea, no vomiting.  No diarrhea.  No constipation. Genitourinary: Negative for dysuria. Musculoskeletal: Negative for back pain. Skin: Negative for rash. Neurological: Negative for headaches, focal weakness or numbness.  10-point ROS otherwise negative.  ____________________________________________   PHYSICAL EXAM:  VITAL SIGNS: ED Triage Vitals  Enc Vitals Group     BP 04/09/17 2019 (!) 136/106     Pulse Rate 04/09/17 2019 (!) 131     Resp 04/09/17 2019 17     Temp 04/09/17 2019 97.5 F (36.4 C)     Temp src --      SpO2 04/09/17 2019 100 %     Weight 04/09/17 2017 108 lb 12.8 oz (49.4 kg)     Height 04/09/17 2017 '5\' 1"'$  (1.549  m)     Head Circumference --      Peak Flow --      Pain Score 04/09/17 2016 9     Pain Loc --      Pain Edu? --      Excl. in [place]? --     Constitutional: Alert and oriented. Ill appearing Eyes: Conjunctivae are normal. PERRL. EOMI. Head: Atraumatic. Nose: No congestion/rhinnorhea. Mouth/Throat: Dry membranes are moist.   Neck: No stridor.   Cardiovascular: Tachycardic, regular rhythm. Grossly normal heart sounds.  Good peripheral circulation with equal and bilateral radial as well as dorsalis pedis pulses. Chest pain as well as back pain are reproducible, highly, to palpation over the sternum as well as the bilateral, lateral thoracic back. Respiratory: Normal respiratory effort.  No retractions. Lungs CTAB. Gastrointestinal: Soft and nontender. No distention.  Musculoskeletal: No lower extremity tenderness nor edema.  No joint effusions. Neurologic:  Normal speech and language. No gross focal neurologic deficits are appreciated.  Skin:  Skin is warm, dry and intact. No rash noted. Psychiatric: Mood and affect are normal. Speech and behavior are normal.  ____________________________________________   LABS (all labs ordered are listed, but  only abnormal results are displayed)  Labs Reviewed  BASIC METABOLIC PANEL - Abnormal; Notable for the following:       Result Value   Potassium 3.0 (*)    Chloride 97 (*)    All other components within normal limits  CBC  TROPONIN I   ____________________________________________  EKG  ED ECG REPORT I, Doran Stabler, the attending physician, personally viewed and interpreted this ECG.   Date: 04/09/2017  EKG Time: 2025  Rate: 143  Rhythm: sinus tachycardia  Axis: Normal  Intervals:none  ST&T Change: No ST segment elevation or depression. No abnormal T-wave inversion.  ____________________________________________  [id number]  DG Chest 2 View (Final result)  Result time 04/09/17 21:13:11  Final result by Sabino Dick, MD (04/09/17 21:13:11)           Narrative:   CLINICAL DATA: Shortness of breath.  EXAM: CHEST 2 VIEW  COMPARISON: Radiograph of March 27, 2017.  FINDINGS: The heart size and mediastinal contours are within normal limits. No pneumothorax or pleural effusion is noted. Right lung is clear. Stable thin walled cavitary abnormality is seen in left midlung. The visualized skeletal structures are unremarkable.  IMPRESSION: Stable cavitary abnormality seen in left midlung. No other abnormality seen.   Electronically Signed By: Marijo Conception, M.D. On: 04/09/2017 21:13            ____________________________________________   PROCEDURES  Procedure(s) performed:   Procedures  Critical Care performed:   ____________________________________________   INITIAL IMPRESSION / ASSESSMENT AND PLAN / ED COURSE  Pertinent labs & imaging results that were available during my care of the patient were reviewed by me and considered in my medical decision making (see chart for details).  Patient with baseline tachycardia. We'll check x-ray and labs to make sure there are no acute changes.      ----------------------------------------- 9:26 PM on 04/09/2017 -----------------------------------------  Patient resting comfortable at this time. Without complaints. Reassuring lab work as well as chest x-ray. Heart rate of 117. Will be discharged to home with family. Patient is understanding of this plan and willing to comply. Complaints appear chronic and unchanged from previous.  ____________________________________________   FINAL CLINICAL IMPRESSION(S) / ED DIAGNOSES  Chest pain. Back pain.    NEW MEDICATIONS STARTED DURING THIS VISIT:  New Prescriptions  No medications on file     Note:  This document was prepared using Dragon voice recognition software and may include unintentional dictation errors.    Orbie Pyo, MD 04/09/17 2127

## 2017-04-09 NOTE — ED Notes (Signed)
This RN called pt brother, Vicente Serene, asking about Audrey Mcgee his wife who was visiting pt earlier. Audrey Mcgee has gone home per [place]. Vicente Serene stated he would try to find pt a ride home.   Pt requested to use phone to call daughter [name]. This RN dialed number and once it was ringing gave it to pt.

## 2017-04-09 NOTE — ED Notes (Signed)
Pt still unable to find ride. IV still in L arm, this RN hooked pt back up to cardiac monitor.

## 2017-04-09 NOTE — ED Notes (Signed)
Pt taken to X-ray via stretcher.

## 2017-04-09 NOTE — ED Triage Notes (Signed)
Pt presents from home via [facility]. Pt appears anxious. Pt is tachy on monitor. Pt lips and mucuous membranes dry. Pt states not feeling well past few days. Pt states some nausea, SOB, difficulty breathing x 2 days. states she has been lying in bed. Decreased appetite. Hx copd, asthma, stage 4 lung cancer. Pt states she lives with her daughter but gets very upset when she talks about daughter. States she wants to go live with brother. Pt tearful. Pt breathing heavy.

## 2017-04-10 MED ORDER — METOPROLOL TARTRATE 5 MG/5ML IV SOLN
5.0000 mg | Freq: Once | INTRAVENOUS | Status: AC
  Administered 2017-04-10: 5 mg via INTRAVENOUS

## 2017-04-10 MED ORDER — METOPROLOL TARTRATE 5 MG/5ML IV SOLN
INTRAVENOUS | Status: AC
  Administered 2017-04-10: 5 mg via INTRAVENOUS
  Filled 2017-04-10: qty 5

## 2017-04-10 NOTE — ED Notes (Signed)
Pts daughter arrived to ED to pickup pt. Pt agrees to have daughter in [place] while this RN explains AMA form and MD explains risk and benefits of AMA. Pt provides verbal understanding of AMA.

## 2017-04-10 NOTE — ED Notes (Signed)
Pt asking to go home. Pt asking for phone to call brother. Phone provided to pt.

## 2017-04-10 NOTE — ED Notes (Signed)
Not able to reach family for pt discharge. Call made to daughter, left message for call back.

## 2017-04-10 NOTE — ED Notes (Signed)
Pt refusing blood work at this time. MD made aware.

## 2017-04-10 NOTE — ED Notes (Signed)
Pt in hallway, has taken all hospital clothing off, removed heart leads, BP cuff, IV and fluids out of left arm. Bleeding controled at this time with band aid. Pt asking for daughter to come get her because we are putting "harmful things" in arm. Pt refusing treatment at this time. Pt asking to sign AMA. MD made aware.

## 2017-04-10 NOTE — ED Notes (Signed)
Pts heart monitor shows SVT on monitor, leads checked, MD at bedside. EKG printed

## 2017-04-14 ENCOUNTER — Encounter: Payer: Self-pay | Admitting: Intensive Care

## 2017-04-14 ENCOUNTER — Emergency Department: Payer: Medicaid Other

## 2017-04-14 ENCOUNTER — Inpatient Hospital Stay
Admission: EM | Admit: 2017-04-14 | Discharge: 2017-04-18 | DRG: 640 | Disposition: A | Discharge status: Home Health | Payer: Medicaid Other | Attending: Internal Medicine | Admitting: Internal Medicine

## 2017-04-14 DIAGNOSIS — C3411 Malignant neoplasm of upper lobe, right bronchus or lung: Secondary | ICD-10-CM | POA: Diagnosis not present

## 2017-04-14 DIAGNOSIS — F0390 Unspecified dementia without behavioral disturbance: Secondary | ICD-10-CM | POA: Diagnosis present

## 2017-04-14 DIAGNOSIS — R5383 Other fatigue: Secondary | ICD-10-CM | POA: Diagnosis not present

## 2017-04-14 DIAGNOSIS — C7972 Secondary malignant neoplasm of left adrenal gland: Secondary | ICD-10-CM | POA: Diagnosis not present

## 2017-04-14 DIAGNOSIS — F4323 Adjustment disorder with mixed anxiety and depressed mood: Secondary | ICD-10-CM

## 2017-04-14 DIAGNOSIS — E43 Unspecified severe protein-calorie malnutrition: Secondary | ICD-10-CM | POA: Diagnosis present

## 2017-04-14 DIAGNOSIS — C7800 Secondary malignant neoplasm of unspecified lung: Secondary | ICD-10-CM | POA: Diagnosis not present

## 2017-04-14 DIAGNOSIS — Z923 Personal history of irradiation: Secondary | ICD-10-CM | POA: Diagnosis not present

## 2017-04-14 DIAGNOSIS — I10 Essential (primary) hypertension: Secondary | ICD-10-CM | POA: Diagnosis present

## 2017-04-14 DIAGNOSIS — Z803 Family history of malignant neoplasm of breast: Secondary | ICD-10-CM | POA: Diagnosis not present

## 2017-04-14 DIAGNOSIS — K219 Gastro-esophageal reflux disease without esophagitis: Secondary | ICD-10-CM | POA: Diagnosis present

## 2017-04-14 DIAGNOSIS — Z809 Family history of malignant neoplasm, unspecified: Secondary | ICD-10-CM

## 2017-04-14 DIAGNOSIS — E0590 Thyrotoxicosis, unspecified without thyrotoxic crisis or storm: Secondary | ICD-10-CM | POA: Diagnosis present

## 2017-04-14 DIAGNOSIS — R0602 Shortness of breath: Secondary | ICD-10-CM

## 2017-04-14 DIAGNOSIS — C3490 Malignant neoplasm of unspecified part of unspecified bronchus or lung: Secondary | ICD-10-CM | POA: Diagnosis present

## 2017-04-14 DIAGNOSIS — E079 Disorder of thyroid, unspecified: Secondary | ICD-10-CM | POA: Diagnosis not present

## 2017-04-14 DIAGNOSIS — J449 Chronic obstructive pulmonary disease, unspecified: Secondary | ICD-10-CM | POA: Diagnosis present

## 2017-04-14 DIAGNOSIS — E46 Unspecified protein-calorie malnutrition: Secondary | ICD-10-CM

## 2017-04-14 DIAGNOSIS — Z9221 Personal history of antineoplastic chemotherapy: Secondary | ICD-10-CM | POA: Diagnosis not present

## 2017-04-14 DIAGNOSIS — R112 Nausea with vomiting, unspecified: Secondary | ICD-10-CM | POA: Diagnosis not present

## 2017-04-14 DIAGNOSIS — F17210 Nicotine dependence, cigarettes, uncomplicated: Secondary | ICD-10-CM | POA: Diagnosis present

## 2017-04-14 DIAGNOSIS — R627 Adult failure to thrive: Secondary | ICD-10-CM | POA: Diagnosis present

## 2017-04-14 DIAGNOSIS — Z515 Encounter for palliative care: Secondary | ICD-10-CM | POA: Diagnosis not present

## 2017-04-14 DIAGNOSIS — Z6822 Body mass index (BMI) 22.0-22.9, adult: Secondary | ICD-10-CM

## 2017-04-14 DIAGNOSIS — Z79899 Other long term (current) drug therapy: Secondary | ICD-10-CM | POA: Diagnosis not present

## 2017-04-14 DIAGNOSIS — Z7189 Other specified counseling: Secondary | ICD-10-CM | POA: Diagnosis not present

## 2017-04-14 DIAGNOSIS — E876 Hypokalemia: Principal | ICD-10-CM | POA: Diagnosis present

## 2017-04-14 LAB — BASIC METABOLIC PANEL
ANION GAP: 12 (ref 5–15)
BUN: 7 mg/dL (ref 6–20)
CO2: 24 mmol/L (ref 22–32)
Calcium: 9.2 mg/dL (ref 8.9–10.3)
Chloride: 99 mmol/L — ABNORMAL LOW (ref 101–111)
Creatinine, Ser: 0.68 mg/dL (ref 0.44–1.00)
GFR calc Af Amer: >60 mL/min (ref >60)
Glucose, Bld: 115 mg/dL — ABNORMAL HIGH (ref 65–99)
POTASSIUM: 2.3 mmol/L — AB (ref 3.5–5.1)
SODIUM: 135 mmol/L (ref 135–145)

## 2017-04-14 LAB — TROPONIN I: Troponin I: <0.03 ng/mL (ref <0.03)

## 2017-04-14 LAB — CBC WITH DIFFERENTIAL/PLATELET
BASOS PCT: 1 %
Basophils Absolute: 0 10*3/uL (ref 0–0.1)
Eosinophils Absolute: 0.4 10*3/uL (ref 0–0.7)
Eosinophils Relative: 7 %
HEMATOCRIT: 36.8 % (ref 35.0–47.0)
HEMOGLOBIN: 12.1 g/dL (ref 12.0–16.0)
LYMPHS ABS: 1.2 10*3/uL (ref 1.0–3.6)
LYMPHS PCT: 21 %
MCH: 28.3 pg (ref 26.0–34.0)
MCHC: 32.9 g/dL (ref 32.0–36.0)
MCV: 85.8 fL (ref 80.0–100.0)
MONO ABS: 0.7 10*3/uL (ref 0.2–0.9)
MONOS PCT: 12 %
NEUTROS ABS: 3.3 10*3/uL (ref 1.4–6.5)
NEUTROS PCT: 59 %
Platelets: 262 10*3/uL (ref 150–440)
RBC: 4.3 MIL/uL (ref 3.80–5.20)
RDW: 14.5 % (ref 11.5–14.5)
WBC: 5.5 10*3/uL (ref 3.6–11.0)

## 2017-04-14 LAB — POTASSIUM: POTASSIUM: 3.4 mmol/L — AB (ref 3.5–5.1)

## 2017-04-14 LAB — T4, FREE: Free T4: 1.96 ng/dL — ABNORMAL HIGH (ref 0.61–1.12)

## 2017-04-14 LAB — TSH

## 2017-04-14 LAB — MAGNESIUM: Magnesium: 1.2 mg/dL — ABNORMAL LOW (ref 1.7–2.4)

## 2017-04-14 MED ORDER — MORPHINE SULFATE (CONCENTRATE) 10 MG/0.5ML PO SOLN
10.0000 mg | ORAL | Status: DC | PRN
  Administered 2017-04-15: 10 mg via ORAL
  Filled 2017-04-14: qty 0.5
  Filled 2017-04-14: qty 1

## 2017-04-14 MED ORDER — MEGESTROL ACETATE 40 MG/ML PO SUSP
400.0000 mg | Freq: Two times a day (BID) | ORAL | Status: DC
  Filled 2017-04-14: qty 10

## 2017-04-14 MED ORDER — HYDROCODONE-ACETAMINOPHEN 5-325 MG PO TABS
1.0000 | ORAL_TABLET | ORAL | Status: DC | PRN
  Administered 2017-04-15 – 2017-04-16 (×5): 1 via ORAL
  Administered 2017-04-17: 14:00:00 2 via ORAL
  Administered 2017-04-17 (×2): 1 via ORAL
  Administered 2017-04-18: 2 via ORAL
  Filled 2017-04-14: qty 2
  Filled 2017-04-14 (×7): qty 1
  Filled 2017-04-14: qty 2

## 2017-04-14 MED ORDER — BOOST / RESOURCE BREEZE PO LIQD
1.0000 | Freq: Two times a day (BID) | ORAL | Status: DC
  Administered 2017-04-14 – 2017-04-18 (×6): 1 via ORAL

## 2017-04-14 MED ORDER — ACETAMINOPHEN 325 MG PO TABS
650.0000 mg | ORAL_TABLET | Freq: Four times a day (QID) | ORAL | Status: DC | PRN
  Administered 2017-04-18: 650 mg via ORAL
  Filled 2017-04-14: qty 2

## 2017-04-14 MED ORDER — METHIMAZOLE 10 MG PO TABS
5.0000 mg | ORAL_TABLET | Freq: Every day | ORAL | Status: DC
  Administered 2017-04-14 – 2017-04-18 (×5): 5 mg via ORAL
  Filled 2017-04-14 (×5): qty 1

## 2017-04-14 MED ORDER — SENNA 8.6 MG PO TABS
1.0000 | ORAL_TABLET | Freq: Every day | ORAL | Status: DC
  Administered 2017-04-14 – 2017-04-18 (×5): 8.6 mg via ORAL
  Filled 2017-04-14 (×5): qty 1

## 2017-04-14 MED ORDER — SODIUM CHLORIDE 0.9% FLUSH
3.0000 mL | Freq: Two times a day (BID) | INTRAVENOUS | Status: DC
  Administered 2017-04-14 – 2017-04-17 (×6): 3 mL via INTRAVENOUS

## 2017-04-14 MED ORDER — PANTOPRAZOLE SODIUM 40 MG PO TBEC
40.0000 mg | DELAYED_RELEASE_TABLET | Freq: Every day | ORAL | Status: DC
  Administered 2017-04-14 – 2017-04-18 (×5): 40 mg via ORAL
  Filled 2017-04-14 (×5): qty 1

## 2017-04-14 MED ORDER — LIDOCAINE VISCOUS 2 % MT SOLN: 10.0000 mL | OROMUCOSAL | Status: DC | PRN

## 2017-04-14 MED ORDER — TRAZODONE HCL 50 MG PO TABS
50.0000 mg | ORAL_TABLET | Freq: Every evening | ORAL | Status: DC | PRN
  Administered 2017-04-14 – 2017-04-17 (×4): 50 mg via ORAL
  Filled 2017-04-14 (×4): qty 1

## 2017-04-14 MED ORDER — LIDOCAINE VISCOUS 2 % MT SOLN
10.0000 mL | OROMUCOSAL | Status: DC | PRN
  Administered 2017-04-15 (×2): 10 mL via OROMUCOSAL
  Filled 2017-04-14 (×3): qty 15

## 2017-04-14 MED ORDER — ALPRAZOLAM 0.5 MG PO TABS
0.2500 mg | ORAL_TABLET | Freq: Three times a day (TID) | ORAL | Status: DC | PRN
  Administered 2017-04-15 – 2017-04-17 (×5): 0.25 mg via ORAL
  Filled 2017-04-14 (×5): qty 1

## 2017-04-14 MED ORDER — ENOXAPARIN SODIUM 40 MG/0.4ML ~~LOC~~ SOLN
40.0000 mg | SUBCUTANEOUS | Status: DC
  Administered 2017-04-14 – 2017-04-17 (×3): 40 mg via SUBCUTANEOUS
  Filled 2017-04-14 (×3): qty 0.4

## 2017-04-14 MED ORDER — BISACODYL 5 MG PO TBEC
5.0000 mg | DELAYED_RELEASE_TABLET | Freq: Every day | ORAL | Status: DC | PRN
Start: 2017-04-14 — End: 2017-04-18

## 2017-04-14 MED ORDER — TIOTROPIUM BROMIDE MONOHYDRATE 18 MCG IN CAPS
18.0000 ug | ORAL_CAPSULE | Freq: Every day | RESPIRATORY_TRACT | Status: DC
  Administered 2017-04-14 – 2017-04-18 (×5): 18 ug via RESPIRATORY_TRACT
  Filled 2017-04-14: qty 5

## 2017-04-14 MED ORDER — VITAMIN B-1 100 MG PO TABS
100.0000 mg | ORAL_TABLET | Freq: Every day | ORAL | Status: DC
  Administered 2017-04-14 – 2017-04-18 (×5): 100 mg via ORAL
  Filled 2017-04-14 (×5): qty 1

## 2017-04-14 MED ORDER — MEGESTROL ACETATE 400 MG/10ML PO SUSP
400.0000 mg | Freq: Two times a day (BID) | ORAL | Status: DC
  Administered 2017-04-14 – 2017-04-18 (×7): 400 mg via ORAL
  Filled 2017-04-14 (×9): qty 10

## 2017-04-14 MED ORDER — ONDANSETRON HCL 4 MG PO TABS
4.0000 mg | ORAL_TABLET | Freq: Four times a day (QID) | ORAL | Status: DC | PRN
  Administered 2017-04-15: 10:00:00 4 mg via ORAL
  Filled 2017-04-14: qty 1

## 2017-04-14 MED ORDER — ACETAMINOPHEN 650 MG RE SUPP: 650.0000 mg | Freq: Four times a day (QID) | RECTAL | Status: DC | PRN

## 2017-04-14 MED ORDER — POTASSIUM CHLORIDE IN NACL 20-0.9 MEQ/L-% IV SOLN
INTRAVENOUS | Status: DC
  Administered 2017-04-14 – 2017-04-16 (×5): via INTRAVENOUS
  Filled 2017-04-14 (×7): qty 1000

## 2017-04-14 MED ORDER — METOPROLOL TARTRATE 25 MG PO TABS
25.0000 mg | ORAL_TABLET | Freq: Two times a day (BID) | ORAL | Status: DC
  Administered 2017-04-14: 25 mg via ORAL
  Filled 2017-04-14 (×2): qty 1

## 2017-04-14 MED ORDER — POTASSIUM CHLORIDE CRYS ER 20 MEQ PO TBCR
40.0000 meq | EXTENDED_RELEASE_TABLET | Freq: Once | ORAL | Status: AC
  Administered 2017-04-14: 40 meq via ORAL
  Filled 2017-04-14: qty 2

## 2017-04-14 MED ORDER — IPRATROPIUM-ALBUTEROL 0.5-2.5 (3) MG/3ML IN SOLN
3.0000 mL | RESPIRATORY_TRACT | Status: DC
  Administered 2017-04-14 – 2017-04-15 (×5): 3 mL via RESPIRATORY_TRACT
  Filled 2017-04-14 (×5): qty 3

## 2017-04-14 MED ORDER — SODIUM CHLORIDE 0.9 % IV SOLN
30.0000 meq | Freq: Once | INTRAVENOUS | Status: AC
  Administered 2017-04-14: 30 meq via INTRAVENOUS
  Filled 2017-04-14: qty 15

## 2017-04-14 MED ORDER — MAGNESIUM OXIDE 400 (241.3 MG) MG PO TABS
400.0000 mg | ORAL_TABLET | Freq: Two times a day (BID) | ORAL | Status: DC
  Administered 2017-04-14 – 2017-04-18 (×8): 400 mg via ORAL
  Filled 2017-04-14 (×8): qty 1

## 2017-04-14 MED ORDER — POTASSIUM CHLORIDE CRYS ER 20 MEQ PO TBCR
40.0000 meq | EXTENDED_RELEASE_TABLET | Freq: Once | ORAL | Status: AC
  Administered 2017-04-14: 20:00:00 40 meq via ORAL
  Filled 2017-04-14: qty 2

## 2017-04-14 MED ORDER — FOLIC ACID 1 MG PO TABS
1000.0000 ug | ORAL_TABLET | Freq: Every day | ORAL | Status: DC
  Administered 2017-04-14 – 2017-04-18 (×5): 1 mg via ORAL
  Filled 2017-04-14 (×5): qty 1

## 2017-04-14 MED ORDER — ONDANSETRON HCL 4 MG/2ML IJ SOLN
4.0000 mg | Freq: Four times a day (QID) | INTRAMUSCULAR | Status: DC | PRN
  Administered 2017-04-15 – 2017-04-16 (×2): 4 mg via INTRAVENOUS
  Filled 2017-04-14 (×2): qty 2

## 2017-04-14 MED ORDER — MAGNESIUM SULFATE 4 GM/100ML IV SOLN
4.0000 g | Freq: Once | INTRAVENOUS | Status: AC
  Administered 2017-04-14: 4 g via INTRAVENOUS
  Filled 2017-04-14: qty 100

## 2017-04-14 MED ORDER — ONDANSETRON HCL 4 MG PO TABS: 4.0000 mg | ORAL_TABLET | Freq: Four times a day (QID) | ORAL | Status: DC | PRN

## 2017-04-14 NOTE — Progress Notes (Signed)
MEDICATION RELATED CONSULT NOTE - INITIAL   Pharmacy Consult for electrolyte monitoring Indication: hypokalemia, hypomagnesemia   Allergies  Allergen Reactions  . No Known Allergies     Patient Measurements: Height: '5\' 1"'$  (154.9 cm) Weight: 105 lb 6 oz (47.8 kg) IBW/kg (Calculated) : 47.8  Vital Signs: Temp: 97.4 F (36.3 C) (04/24 1437) Temp Source: Oral (04/24 1437) BP: 119/76 (04/24 1437) Pulse Rate: 112 (04/24 1437) Intake/Output from previous day: No intake/output data recorded. Intake/Output from this shift: No intake/output data recorded.  Labs:  Recent Labs  04/14/17 1056 04/14/17 1546  WBC 5.5  --   HGB 12.1  --   HCT 36.8  --   PLT 262  --   CREATININE 0.68  --   MG  --  1.2*   Estimated Creatinine Clearance: 60.7 mL/min (by C-G formula based on SCr of 0.68 mg/dL).   Microbiology: No results found for this or any previous visit (from the past 720 hour(s)).  Medical History: Past Medical History:  Diagnosis Date  . COPD (chronic obstructive pulmonary disease) ([place])   . Dementia   . Hypertension   . Lung cancer ([place])   . Thyroid disease     Medications:  Scheduled:  . enoxaparin (LOVENOX) injection  40 mg Subcutaneous Q24H  . feeding supplement  1 Container Oral BID BM  . folic acid  7,972 mcg Oral Daily  . ipratropium-albuterol  3 mL Nebulization Q4H  . magnesium oxide  400 mg Oral BID  . megestrol  400 mg Oral BID  . methimazole  5 mg Oral Daily  . metoprolol  25 mg Oral BID  . pantoprazole  40 mg Oral Daily  . potassium chloride  40 mEq Oral Once  . senna  1 tablet Oral Daily  . sodium chloride flush  3 mL Intravenous Q12H  . thiamine  100 mg Oral Daily  . tiotropium  18 mcg Inhalation Daily   Infusions:  . 0.9 % NaCl with KCl 20 mEq / L 75 mL/hr at 04/14/17 1526  . magnesium sulfate 1 - 4 g bolus IVPB    . potassium chloride (KCL MULTIRUN) 30 mEq in 265 mL IVPB      Assessment: Pharmacy consulted to monitor and dose  electrolytes in this 5 yoF with metastatic non-small cell lung cancer presenting with SOB, weakness and n/v over the past week. Pt currently receiving chemotherapy for lung cancer.  Goal of Therapy:  Electrolytes WNL  Plan:  4/24 1100 K = 2.3 MD ordered 40 mEq potassium. Will order additional KCl IV 30 mEq x 1 and KCl 40 mEq PO x 1. Will check potassium 1 hour after infusion ends 4/24 1530 Mg = 1.2 Will order magnesium 4 g IV x 1 and recheck mag with am labs  Darrow Bussing, PharmD Pharmacy Resident 04/14/2017 4:41 PM

## 2017-04-14 NOTE — ED Provider Notes (Signed)
[facility] Emergency Department Provider Note ____________________________________________   I have reviewed the triage vital signs and the triage nursing note.  HISTORY  Chief Complaint Shortness of Breath   Historian Patient  HPI Audrey Mcgee is a 56 y.o. female with history of lung cancer, as well as a history of COPD and by report dementia and hypertension in her documented medical chart history, presents today with a complaint of shortness of breath. She states that she was talking with her daughter when she became short of breath and felt very anxious. Here she is tearful and denies shortness of breath. Denies coughing. Denies fevers. She does report feeling fatigued. Symptoms are mild.    Past Medical History:  Diagnosis Date  . COPD (chronic obstructive pulmonary disease) ([place])   . Dementia   . Hypertension   . Lung cancer ([place])   . Thyroid disease     Patient Active Problem List   Diagnosis Date Noted  . HCAP (healthcare-associated pneumonia) 03/12/2017  . Hyperthyroidism 03/02/2017  . COPD (chronic obstructive pulmonary disease) ([place]) 03/02/2017  . Subacute delirium 02/18/2017  . Altered mental status 02/12/2017  . Cannabis abuse 02/12/2017  . Nausea 02/12/2017  . Failure to thrive in adult 02/12/2017  . Hypokalemia 02/12/2017  . Generalized weakness 02/12/2017  . Lung cancer ([place]) 02/12/2017  . Hypothyroidism 02/11/2017  . Malignancy ([place])   . Palliative care by specialist   . Goals of care, counseling/discussion   . DNR (do not resuscitate) discussion   . Depressive disorder   . Dehydration 02/07/2017  . Weight loss 02/07/2017  . Encounter for antineoplastic chemotherapy 02/01/2017  . 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 Sepsis ([place]) 11/07/2016  . Protein-calorie malnutrition, severe 11/07/2016  . Acute respiratory distress 06/21/2016  . COPD exacerbation ([place]) 06/21/2016  . Sinus tachycardia 06/21/2016   . Recurrent aspiration bronchitis/pneumonia ([place]) 06/21/2016  . Dysphagia 06/21/2016  . Nausea and vomiting 06/21/2016  . Dental abscess 05/10/2016  . Swallowing difficulty 03/28/2016  . Hypomagnesemia 02/29/2016  . Hoarseness 02/18/2016  . Cancer related pain 07/08/2015  . Adenocarcinoma, lung ([place]) 03/26/2015  . 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 Ear ache 10/03/2014  . Abscess of buccal cavity 10/03/2014  . Lump in neck 10/03/2014  . Laryngeal pain 10/03/2014    Past Surgical History:  Procedure Laterality Date  . CESAREAN SECTION CLASSICAL    . STOMACH SURGERY     Pt reports for a tumor    Prior to Admission medications   Medication Sig Start Date End Date Taking? Authorizing Provider  ALPRAZolam (XANAX) 0.25 MG tablet Take 1 tablet (0.25 mg total) by mouth 3 (three) times daily as needed for anxiety. 03/05/17  Yes [name], MD  ipratropium-albuterol (DUONEB) 0.5-2.5 (3) MG/3ML SOLN Take 3 mLs by nebulization every 4 (four) hours. 02/12/17  Yes [name], MD  levothyroxine (SYNTHROID, LEVOTHROID) 100 MCG tablet Take 100 mcg by mouth daily before breakfast.  02/24/17  Yes Historical Provider, MD  magnesium oxide (MAG-OX) 400 MG tablet Take 1 tablet (400 mg total) by mouth 2 (two) times daily. 03/04/17  Yes [name], MD  methimazole (TAPAZOLE) 5 MG tablet Take 1 tablet (5 mg total) by mouth daily. 03/19/17  Yes [name], MD  metoprolol (LOPRESSOR) 50 MG tablet Take 0.5 tablets (25 mg total) by mouth 2 (two) times daily. 03/04/17  Yes [name], MD  Morphine Sulfate (MORPHINE CONCENTRATE) 10 mg / 0.5 ml concentrated solution  Take 0.5 mLs (10 mg total) by mouth every 4 (four) hours as needed for severe pain. 03/05/17  Yes [name], MD  omeprazole (PRILOSEC) 20 MG capsule Take 1 capsule (20 mg total) by mouth daily. 06/20/16  Yes Lequita Asal, MD  ondansetron (ZOFRAN) 4 MG tablet Take 1 tablet (4 mg total) by mouth every 6  (six) hours as needed for nausea. 02/12/17  Yes [name], MD  potassium chloride 20 MEQ TBCR Take 1 tab twice a day 03/04/17  Yes [name], MD  traZODone (DESYREL) 50 MG tablet Take 1 tablet (50 mg total) by mouth at bedtime as needed for sleep. 02/21/17  Yes [name], MD  docusate sodium (COLACE) 100 MG capsule Take 100 mg by mouth 2 (two) times daily as needed for mild constipation.    Historical Provider, MD  feeding supplement (BOOST / RESOURCE BREEZE) LIQD Take 1 Container by mouth 2 (two) times daily between meals. 02/12/17   [name], MD  folic acid (FOLVITE) 948 MCG tablet Take 800 mcg by mouth daily.     Historical Provider, MD  lidocaine (XYLOCAINE) 2 % solution Use as directed 10 mLs in the mouth or throat every 4 (four) hours as needed for mouth pain.    Historical Provider, MD  megestrol (MEGACE) 40 MG/ML suspension Take 10 mLs (400 mg total) by mouth 2 (two) times daily. Patient not taking: Reported on 04/14/2017 02/02/17   Lequita Asal, MD  senna (SENOKOT) 8.6 MG TABS tablet Take 1 tablet (8.6 mg total) by mouth daily. Patient not taking: Reported on 04/14/2017 02/13/17   [name], MD  thiamine (VITAMIN B-1) 100 MG tablet Take 1 tablet (100 mg total) by mouth daily. Patient not taking: Reported on 04/14/2017 02/21/17   [name], MD  tiotropium (SPIRIVA) 18 MCG inhalation capsule Place 1 capsule (18 mcg total) into inhaler and inhale daily. Patient not taking: Reported on 04/14/2017 06/22/16   [name], MD    Allergies  Allergen Reactions  . No Known Allergies     Family History  Problem Relation Age of Onset  . Hypertension Father   . Cancer Father   . Diabetes Father   . Cancer Maternal Aunt     Social History Social History  Substance Use Topics  . Smoking status: Current Every Day Smoker    Packs/day: 0.25    Years: 15.00    Types: Cigarettes  . Smokeless tobacco: Never Used  . Alcohol use No    Review of  Systems  Constitutional: Negative for fever. Eyes: Negative for visual changes. ENT: Negative for sore throat. Cardiovascular: Negative for chest pain. Respiratory: Positive for shortness of breath. Gastrointestinal: Negative for abdominal pain, vomiting and diarrhea. Genitourinary: Negative for dysuria. Musculoskeletal: Negative for back pain. Skin: Negative for rash. Neurological: Negative for headache. 10 point Review of Systems otherwise negative ____________________________________________   PHYSICAL EXAM:  VITAL SIGNS: ED Triage Vitals  Enc Vitals Group     BP 04/14/17 1043 (!) 122/104     Pulse Rate 04/14/17 1043 (!) 111     Resp 04/14/17 1043 (!) 27     Temp 04/14/17 1043 97.7 F (36.5 C)     Temp Source 04/14/17 1043 Oral     SpO2 04/14/17 1042 100 %     Weight 04/14/17 1043 108 lb (49 kg)     Height 04/14/17 1043 '5\' 1"'$  (1.549 m)     Head Circumference --  Peak Flow --      Pain Score --      Pain Loc --      Pain Edu? --      Excl. in [facility]? --      Constitutional: Alert and Cooperative, but a poor historian. Well appearing and in no distress. HEENT   Head: Normocephalic and atraumatic.      Eyes: Conjunctivae are normal. PERRL. Normal extraocular movements.      Ears:         Nose: No congestion/rhinnorhea.   Mouth/Throat: Mucous membranes are moist.   Neck: No stridor. Cardiovascular/Chest:Tachycardic, regular rhythm.  No murmurs, rubs, or gallops. Respiratory: Normal respiratory effort without tachypnea nor retractions. Breath sounds are clear and equal bilaterally. No wheezes/rales/rhonchi. Gastrointestinal: Soft. No distention, no guarding, no rebound. Nontender.    Genitourinary/rectal:Deferred Musculoskeletal: Nontender with normal range of motion in all extremities. No joint effusions.  No lower extremity tenderness.  No edema. Neurologic:  Normal speech and language. No gross or focal neurologic deficits are appreciated. Skin:  Skin is  warm, dry and intact. No rash noted. Psychiatric: Mood and affect are normal. Speech and behavior are normal. Patient exhibits appropriate insight and judgment.   ____________________________________________  LABS (pertinent positives/negatives)  Labs Reviewed  BASIC METABOLIC PANEL - Abnormal; Notable for the following:       Result Value   Potassium 2.3 (*)    Chloride 99 (*)    Glucose, Bld 115 (*)    All other components within normal limits  CBC WITH DIFFERENTIAL/PLATELET  TROPONIN I    ____________________________________________    EKG I, Lisa Roca, MD, the attending physician have personally viewed and interpreted all ECGs.  109 bpm.  sinus tachycardia. Narrow QRS. Normal axis. Normal ST and T-wave ____________________________________________  RADIOLOGY All Xrays were viewed by me. interpreted by me  CXR: No focal infiltrate or edema __________________________________________  PROCEDURES  Procedure(s) performed: None  Critical Care performed: None  ____________________________________________   ED COURSE / ASSESSMENT AND PLAN  Pertinent labs & imaging results that were available during my care of the patient were reviewed by me and considered in my medical decision making (see chart for details).   Ms. Cragin seems quite anxious, and states that her symptoms of dyspnea are now gone. Unclear to me whether or not she may have had a panic attack. In any case, she not complaining of any chest discomfort and I think ACS is unlikely. She isn't had any ongoing dyspnea or pleuritic chest discomfort nor hypoxia and I think PE is unlikely. She does not have any infectious symptoms.  On screening evaluation for the fatigue, laboratory studies shows significant severe hypokalemia. She was started on by mouth repletion, the hospitalist for hospital admission and IV repletion.    CONSULTATIONS:  Dr. Benjie Karvonen, hospitalist for admission.   Patient / Family /  Caregiver informed of clinical course, medical decision-making process, and agree with plan.    ___________________________________________   FINAL CLINICAL IMPRESSION(S) / ED DIAGNOSES   Final diagnoses:  SOB (shortness of breath)  Hypokalemia  Fatigue, unspecified type              Note: This dictation was prepared with Dragon dictation. Any transcriptional errors that result from this process are unintentional    Lisa Roca, MD 04/14/17 1257

## 2017-04-14 NOTE — ED Triage Notes (Signed)
Patient arrived by EMS from her daughters house where she reports living now. Patient has HX lung cancer Patient called out due to SOB. No respiratory distress noted upon arrival and lung sounds clear. Pt ambulatory from stretcher to bed with no problems. Does not wear O2 at home. O2 sats 100% RA upon arrival

## 2017-04-14 NOTE — H&P (Signed)
[place] [facility] [place] NAME: Audrey Mcgee    MR#:  [id number]  DATE OF BIRTH:  1961/02/12  DATE OF ADMISSION:  04/14/2017  PRIMARY CARE PHYSICIAN: Donnie Coffin, MD   REQUESTING/REFERRING PHYSICIAN: Dr Reita Cliche  CHIEF COMPLAINT:    SOB weakness HISTORY OF PRESENT ILLNESS:  Audrey Mcgee  is a 55 y.o. female with a known history of metastatic poorly differentiated non-small cell lung cancer And Hyperthyroidism who presented via EMS from her daughter's house due to shortness of breath. No respiratory distress was noted upon arrival and her oxygen saturation was 100% on room air. Chest x-ray shows no acute infiltrate. On lab [place] work potassium level is 2.3. Patient is not a very good historian however reports that she has had nausea and vomiting over the past week. She is currently receiving chemotherapy for her lung cancer.  Patient reports generalized weakness and fatigue. Patient denies chest pain. PAST MEDICAL HISTORY:   Past Medical History:  Diagnosis Date  . COPD (chronic obstructive pulmonary disease) ([place])   . Dementia   . Hypertension   . Lung cancer ([place])   . Thyroid disease     PAST SURGICAL HISTORY:   Past Surgical History:  Procedure Laterality Date  . CESAREAN SECTION CLASSICAL    . STOMACH SURGERY     Pt reports for a tumor    SOCIAL HISTORY:   Social History  Substance Use Topics  . Smoking status: Current Every Day Smoker    Packs/day: 0.25    Years: 15.00    Types: Cigarettes  . Smokeless tobacco: Never Used  . Alcohol use No    FAMILY HISTORY:   Family History  Problem Relation Age of Onset  . Hypertension Father   . Cancer Father   . Diabetes Father   . Cancer Maternal Aunt     DRUG ALLERGIES:   Allergies  Allergen Reactions  . No Known Allergies     REVIEW OF SYSTEMS:   Review of Systems  Constitutional: Positive for malaise/fatigue and weight loss. Negative for chills and fever.   HENT: Negative.  Negative for ear discharge, ear pain, hearing loss, nosebleeds and sore throat.   Eyes: Negative.  Negative for blurred vision and pain.  Respiratory: Positive for shortness of breath. Negative for cough, hemoptysis and wheezing.   Cardiovascular: Negative.  Negative for chest pain, palpitations and leg swelling.  Gastrointestinal: Negative.  Negative for abdominal pain, blood in stool, diarrhea, nausea and vomiting.  Genitourinary: Negative.  Negative for dysuria.  Musculoskeletal: Negative.  Negative for back pain.  Skin: Negative.   Neurological: Positive for weakness. Negative for dizziness, tremors, speech change, focal weakness, seizures and headaches.  Endo/Heme/Allergies: Negative.  Does not bruise/bleed easily.  Psychiatric/Behavioral: Positive for memory loss. Negative for depression, hallucinations and suicidal ideas.    MEDICATIONS AT HOME:   Prior to Admission medications   Medication Sig Start Date End Date Taking? Authorizing Provider  ALPRAZolam (XANAX) 0.25 MG tablet Take 1 tablet (0.25 mg total) by mouth 3 (three) times daily as needed for anxiety. 03/05/17  Yes [name], MD  ipratropium-albuterol (DUONEB) 0.5-2.5 (3) MG/3ML SOLN Take 3 mLs by nebulization every 4 (four) hours. 02/12/17  Yes [name], MD  levothyroxine (SYNTHROID, LEVOTHROID) 100 MCG tablet Take 100 mcg by mouth daily before breakfast.  02/24/17  Yes Historical Provider, MD  magnesium oxide (MAG-OX) 400 MG tablet Take 1 tablet (400 mg total) by mouth 2 (two)  times daily. 03/04/17  Yes [name], MD  methimazole (TAPAZOLE) 5 MG tablet Take 1 tablet (5 mg total) by mouth daily. 03/19/17  Yes [name], MD  metoprolol (LOPRESSOR) 50 MG tablet Take 0.5 tablets (25 mg total) by mouth 2 (two) times daily. 03/04/17  Yes [name], MD  Morphine Sulfate (MORPHINE CONCENTRATE) 10 mg / 0.5 ml concentrated solution Take 0.5 mLs (10 mg total) by mouth every 4 (four) hours as needed  for severe pain. 03/05/17  Yes [name], MD  omeprazole (PRILOSEC) 20 MG capsule Take 1 capsule (20 mg total) by mouth daily. 06/20/16  Yes Lequita Asal, MD  ondansetron (ZOFRAN) 4 MG tablet Take 1 tablet (4 mg total) by mouth every 6 (six) hours as needed for nausea. 02/12/17  Yes [name], MD  potassium chloride 20 MEQ TBCR Take 1 tab twice a day 03/04/17  Yes [name], MD  traZODone (DESYREL) 50 MG tablet Take 1 tablet (50 mg total) by mouth at bedtime as needed for sleep. 02/21/17  Yes [name], MD  docusate sodium (COLACE) 100 MG capsule Take 100 mg by mouth 2 (two) times daily as needed for mild constipation.    Historical Provider, MD  feeding supplement (BOOST / RESOURCE BREEZE) LIQD Take 1 Container by mouth 2 (two) times daily between meals. 02/12/17   [name], MD  folic acid (FOLVITE) 884 MCG tablet Take 800 mcg by mouth daily.     Historical Provider, MD  lidocaine (XYLOCAINE) 2 % solution Use as directed 10 mLs in the mouth or throat every 4 (four) hours as needed for mouth pain.    Historical Provider, MD  megestrol (MEGACE) 40 MG/ML suspension Take 10 mLs (400 mg total) by mouth 2 (two) times daily. Patient not taking: Reported on 04/14/2017 02/02/17   Lequita Asal, MD  senna (SENOKOT) 8.6 MG TABS tablet Take 1 tablet (8.6 mg total) by mouth daily. Patient not taking: Reported on 04/14/2017 02/13/17   [name], MD  thiamine (VITAMIN B-1) 100 MG tablet Take 1 tablet (100 mg total) by mouth daily. Patient not taking: Reported on 04/14/2017 02/21/17   [name], MD  tiotropium (SPIRIVA) 18 MCG inhalation capsule Place 1 capsule (18 mcg total) into inhaler and inhale daily. Patient not taking: Reported on 04/14/2017 06/22/16   [name], MD      VITAL SIGNS:  Blood pressure (!) 122/104, pulse (!) 111, temperature 97.7 F (36.5 C), temperature source Oral, resp. rate (!) 27, height '5\' 1"'$  (1.549 m), weight 49 kg (108 lb), last menstrual period  01/30/2008, SpO2 100 %.  PHYSICAL EXAMINATION:   Physical Exam  Constitutional: She is oriented to person, place, and time. No distress.  Thin frail   HENT:  Head: Normocephalic.  Eyes: No scleral icterus.  Neck: Normal range of motion. Neck supple. No JVD present. No tracheal deviation present.  Cardiovascular: Normal rate, regular rhythm and normal heart sounds.  Exam reveals no gallop and no friction rub.   No murmur heard. Pulmonary/Chest: Effort normal and breath sounds normal. No respiratory distress. She has no wheezes. She has no rales. She exhibits no tenderness.  Abdominal: Soft. Bowel sounds are normal. She exhibits no distension and no mass. There is no tenderness. There is no rebound and no guarding.  Musculoskeletal: Normal range of motion. She exhibits no edema.  Neurological: She is alert and oriented to person, place, and time.  Skin: Skin is warm. No rash noted.  No erythema.  Psychiatric: Affect normal.      LABORATORY PANEL:   CBC  Recent Labs Lab 04/14/17 1056  WBC 5.5  HGB 12.1  HCT 36.8  PLT 262   ------------------------------------------------------------------------------------------------------------------  Chemistries   Recent Labs Lab 04/14/17 1056  NA 135  K 2.3*  CL 99*  CO2 24  GLUCOSE 115*  BUN 7  CREATININE 0.68  CALCIUM 9.2   ------------------------------------------------------------------------------------------------------------------  Cardiac Enzymes  Recent Labs Lab 04/14/17 1056  TROPONINI <0.03   ------------------------------------------------------------------------------------------------------------------  RADIOLOGY:  No results found.  EKG:  Sinus tachycardia no ST elevation or depression  IMPRESSION AND PLAN:   56 year old female with metastatic poorly differentiated non-small cell lung cancer and hyperthyroidism who presents with shortness of breath and found to have severe hypokalemia.She is  currently followed by Hospice  1. Severe hypokalemia: Patient will need replacement of at least 120 mEq of potassium. Check magnesium level  2. Shortness of breath: Patient is 100% on room air and chest x-ray shows no acute infiltrate Continue to monitor  3. Hyperthyroidism: Check TSH, T3 and T4  4. History of metastatic poorly differentiated non-small lung cancer on hospice with adult failure to thrive Oncology consult Case management consult Continue morphine sulfate Continue boost supplements  5. Essential hypertension: Continue metoprolol  6. Tobacco dependence: Patient is encouraged to quit smoking. Counseling was provided for 4 minutes.      with ED provider. Management plans discussed with the patient and she in agreement  CODE STATUS: FULL  TOTAL TIME TAKING CARE OF THIS PATIENT: 40 minutes.    Yeimy Brabant M.D on 04/14/2017 at 12:59 PM  Between 7am to 6pm - Pager - [phone]  After 6pm go to [email or web address] - password [email or web address]  Office  [phone]  CC: Primary care physician; Donnie Coffin, MD

## 2017-04-14 NOTE — Progress Notes (Signed)
Pt states that she placed her [employer] ATM card and her food stamp card between the sheet and the mattress of the bed she was in in the ED.  She has gone through her purse, I've checked the linens that were on the ED gurney, checked the pockets of the coat.  I called the ED and the nurse Amber checked the room and the bed.  I called Security for them to check along the route from the ED to 1C and nothing has been found.

## 2017-04-14 NOTE — ED Notes (Signed)
IV team at bedside 

## 2017-04-15 LAB — BASIC METABOLIC PANEL
ANION GAP: 7 (ref 5–15)
BUN: 5 mg/dL — AB (ref 6–20)
CALCIUM: 8.6 mg/dL — AB (ref 8.9–10.3)
CO2: 21 mmol/L — ABNORMAL LOW (ref 22–32)
CREATININE: 0.71 mg/dL (ref 0.44–1.00)
Chloride: 109 mmol/L (ref 101–111)
GFR calc Af Amer: >60 mL/min (ref >60)
GLUCOSE: 89 mg/dL (ref 65–99)
Potassium: 3.2 mmol/L — ABNORMAL LOW (ref 3.5–5.1)
Sodium: 137 mmol/L (ref 135–145)

## 2017-04-15 LAB — T3: T3 TOTAL: 173 ng/dL (ref 71–180)

## 2017-04-15 LAB — CBC
HCT: 32 % — ABNORMAL LOW (ref 35.0–47.0)
HEMOGLOBIN: 10.4 g/dL — AB (ref 12.0–16.0)
MCH: 28.3 pg (ref 26.0–34.0)
MCHC: 32.5 g/dL (ref 32.0–36.0)
MCV: 87.1 fL (ref 80.0–100.0)
PLATELETS: 244 10*3/uL (ref 150–440)
RBC: 3.67 MIL/uL — ABNORMAL LOW (ref 3.80–5.20)
RDW: 14.6 % — AB (ref 11.5–14.5)
WBC: 5.5 10*3/uL (ref 3.6–11.0)

## 2017-04-15 LAB — MAGNESIUM: MAGNESIUM: 1.8 mg/dL (ref 1.7–2.4)

## 2017-04-15 MED ORDER — POTASSIUM CHLORIDE CRYS ER 20 MEQ PO TBCR
40.0000 meq | EXTENDED_RELEASE_TABLET | Freq: Once | ORAL | Status: AC
Start: 2017-04-15 — End: 2017-04-15
  Administered 2017-04-15: 10:00:00 40 meq via ORAL
  Filled 2017-04-15: qty 2

## 2017-04-15 MED ORDER — IPRATROPIUM-ALBUTEROL 0.5-2.5 (3) MG/3ML IN SOLN
3.0000 mL | Freq: Four times a day (QID) | RESPIRATORY_TRACT | Status: DC
  Administered 2017-04-15 – 2017-04-17 (×9): 3 mL via RESPIRATORY_TRACT
  Filled 2017-04-15 (×11): qty 3

## 2017-04-15 MED ORDER — SODIUM CHLORIDE 0.9 % IV BOLUS (SEPSIS)
500.0000 mL | Freq: Once | INTRAVENOUS | Status: AC
  Administered 2017-04-15: 500 mL via INTRAVENOUS

## 2017-04-15 NOTE — Progress Notes (Signed)
[facility] NAME: Audrey Mcgee    MR#:  [id number]  DATE OF BIRTH:  10/04/1961  SUBJECTIVE:  CHIEF COMPLAINT:   Chief Complaint  Patient presents with  . Shortness of Breath   -  Appears very malnourished. Hopeful that she can go home and live by herself. Thinks she got chemo 3 weeks ago- though evidence anywhere. Hospice signed off as outpatient due to her unsafe living conditions  REVIEW OF SYSTEMS:  Review of Systems  Constitutional: Positive for malaise/fatigue and weight loss. Negative for chills and fever.  HENT: Negative for congestion, ear discharge, hearing loss and nosebleeds.   Eyes: Negative for blurred vision and double vision.  Respiratory: Positive for shortness of breath. Negative for cough and wheezing.   Cardiovascular: Negative for chest pain, palpitations and leg swelling.  Gastrointestinal: Negative for abdominal pain, constipation, diarrhea, nausea and vomiting.  Genitourinary: Negative for dysuria.  Musculoskeletal: Negative for myalgias.  Neurological: Negative for dizziness, sensory change, speech change, focal weakness, seizures and headaches.  Psychiatric/Behavioral: Negative for depression.    DRUG ALLERGIES:   Allergies  Allergen Reactions  . No Known Allergies     VITALS:  Blood pressure (!) 94/55, pulse (!) 105, temperature 97.8 F (36.6 C), temperature source Oral, resp. rate 19, height '5\' 1"'$  (1.549 m), weight 50.3 kg (111 lb), last menstrual period 01/30/2008, SpO2 100 %.  PHYSICAL EXAMINATION:  Physical Exam  GENERAL:  56 y.o.-year-old malnourished patient lying in the bed with no acute distress.  EYES: Pupils equal, round, reactive to light and accommodation. No scleral icterus. Extraocular muscles intact.  HEENT: Head atraumatic, normocephalic. Oropharynx and nasopharynx clear.  NECK:  Supple, no jugular venous distention. No thyroid enlargement, no tenderness.  LUNGS: Normal  breath sounds bilaterally, no wheezing, rales,rhonchi or crepitation. No use of accessory muscles of respiration. Decreased bibasilar breath sounds. CARDIOVASCULAR: S1, S2 normal. No murmurs, rubs, or gallops.  ABDOMEN: Soft, nontender, nondistended. Bowel sounds present. No organomegaly or mass.  EXTREMITIES: No pedal edema, cyanosis, or clubbing.  NEUROLOGIC: Cranial nerves II through XII are intact. Muscle strength 5/5 in all extremities. Sensation intact. Gait not checked. Global weakness noted PSYCHIATRIC: The patient is alert and oriented x 2. Poor judgment  SKIN: No obvious rash, lesion, or ulcer.    LABORATORY PANEL:   CBC  Recent Labs Lab 04/14/17 1056  WBC 5.5  HGB 12.1  HCT 36.8  PLT 262   ------------------------------------------------------------------------------------------------------------------  Chemistries   Recent Labs Lab 04/15/17 0720  NA 137  K 3.2*  CL 109  CO2 21*  GLUCOSE 89  BUN 5*  CREATININE 0.71  CALCIUM 8.6*  MG 1.8   ------------------------------------------------------------------------------------------------------------------  Cardiac Enzymes  Recent Labs Lab 04/14/17 1056  TROPONINI <0.03   ------------------------------------------------------------------------------------------------------------------  RADIOLOGY:  Dg Chest 2 View  Result Date: 04/14/2017 CLINICAL DATA:  Short of breath EXAM: CHEST  2 VIEW COMPARISON:  04/09/2017 FINDINGS: Left hemidiaphragm remains elevated. Left upper lobe cavitary area is stable. Right lung is clear. Normal heart size. No pneumothorax. IMPRESSION: Stable left upper lobe cavitary abnormality. Electronically Signed   By: Marybelle Killings M.D.   On: 04/14/2017 13:22    EKG:   Orders placed or performed during the hospital encounter of 04/09/17  . ED EKG  . ED EKG  . EKG 12-Lead  . EKG 12-Lead    ASSESSMENT AND PLAN:   56 year old female with past medical history significant for COPD,  hypertension, hypothyroidism,  and no carcinoma of lung with recurrence and cavitary lesion now presents to the hospital secondary to shortness of breath and noted to be hypokalemic.  #1 electrolyte abnormalities-hypokalemia and hypomagnesemia-due to poor nutrition. -Being replaced aggressively. Appreciate pharmacy consult. Follow-up -Patient is high risk for refeeding syndrome. Monitor electrolytes.  #2 severe malnourishment-poor by mouth intake. Also says sometimes she gets depressed and doesn't eat food. -Dietitian consult. Start Megace  #3 shortness of breath-triggered by anxiety episodes. Currently comfortable on room air. Has poorly differentiated non-small cell lung cancer with cavitary lesion. No change on chest x-ray  #4 history of poorly differentiated non-small cell lung cancer-patient thinks she is getting chemotherapy, however no records of chemotherapy. -Oncology consult last month in the hospital recommending hospice. Patient is just discharged from hospice due to her unsafe living conditions. -No recent visits to [facility] as well. -Palliative care consulted. Might need to healthcare guardian and CODE STATUS changes.  #5 hyperthyroidism-continue methimazole  #6 hypertension-discontinue metoprolol as blood pressure is on low normal side.  #7 GERD-Protonix.  #8 DVT prophylaxis-on Lovenox  Physical therapy consult today     All the records are reviewed and case discussed with Care Management/Social Workerr. Management plans discussed with the patient, family and they are in agreement.  CODE STATUS: Full code  TOTAL TIME TAKING CARE OF THIS PATIENT: 38 minutes.   POSSIBLE D/C IN 2 DAYS, DEPENDING ON CLINICAL CONDITION.   Gladstone Lighter M.D on 04/15/2017 at 12:33 PM  Between 7am to 6pm - Pager - [phone]  After 6pm go to [email or web address] - password [email or web address]  Office  [phone]  CC: Primary care physician; Donnie Coffin, MD

## 2017-04-15 NOTE — Progress Notes (Signed)
PT Cancellation Note  Patient Details Name: Audrey Mcgee MRN: [id number] DOB: Sep 15, 1961   Cancelled Treatment:    Reason Eval/Treat Not Completed: Medical issues which prohibited therapy (BP 81/50 and about to receive bolus per RN).  Will continue to follow acutely.  Thank you for this referral.   Collie Siad PT, DPT 04/15/2017, 10:22 AM

## 2017-04-15 NOTE — Progress Notes (Signed)
Pt complaining of back pain and pain in mouth and teeth.  Gave pt Roxanol and oral lidocaine. Pt became extremely anxious and requesting that this RN stay in the room with her.  Stayed in room for 10 minutes then told pt I needed to go see my other patients.  Pt asking for someone to stay with her.  NT frequently visiting pt but unable to stay constantly with pt.  Pt refusing xanax or her evening meds at this time.  Dorna Bloom RN

## 2017-04-15 NOTE — Progress Notes (Signed)
Initial Nutrition Assessment  DOCUMENTATION CODES:   Severe malnutrition in context of chronic illness  INTERVENTION:  Provide Carnation Instant Breakfast in whole milk po TID, each serving provides 280 kcal and 13 grams of protein. Will provide patient with a handout on this supplement so she can get it from the grocery store. Reports she cannot regularly afford Ensure or Boost.  Provide snacks po BID between meals. Patient requesting sugar cookies. Will also send whole milk with those snacks for added calories/protein.  Monitor magnesium, potassium, and phosphorus daily for at least 3 days, MD to replete as needed, as pt is at risk for refeeding syndrome given severe malnutrition.  NUTRITION DIAGNOSIS:   Malnutrition (Severe) related to chronic illness (stage IV lung cancer) as evidenced by severe depletion of body fat, severe depletion of muscle mass.  GOAL:   Patient will meet greater than or equal to 90% of their needs  MONITOR:   PO intake, Supplement acceptance, Labs, Weight trends, I & O's  REASON FOR ASSESSMENT:   Malnutrition Screening Tool, Consult Assessment of nutrition requirement/status  ASSESSMENT:   56 year old female with PMHx of HTN, COPD, hyperthyroidism, dementia, stage IV metastatic poorly differentiated non-small cell lung cancer who presents with shortness of breath found to have severe hypokalemia. Patient followed by hospice at home.   -Per patient and HPI patient is currently receiving chemotherapy. Per last Oncology note on 3/25 patient has actually not received her Alimta since 10/31/2016 due to missed appointments and hospitalizations.  Spoke with patient at bedside. She is known to this RD from several previous admissions. She reports her appetite was poor for a few days prior to admission but it is picking back up now. She reports she has been attempting to eat three meals per day but she can't finish everything. For breakfast she tries to eat ham,  2 eggs, grits, with hot chocolate. For lunch she has a ham and cheese or fish sandwich with french fries or macaroni and cheese. For dinner she has chicken or fish with collard greens. Unable to report exactly how much of those meals she can finish. She reports she drinks Boost or Ensure when she has it, but is unable to afford it regularly. She reports ongoing nausea and she is experiencing approximately two episodes of emesis per week. She reports that she lives with her oldest daughter. Patient reports that her daughter will help her with shopping and preparing meals, but then also reported that she does everything herself and gets very short of breath/tired.  On previous admissions patient had always reported UBW was 127 lbs. She is now reporting it was 136 lbs. Per chart patient was 120.4 lbs on 03/13/2017 and has lost 9.4 lbs (7.8% body weight) over one month, which is significant for time frame. She reports she is losing weight because she is not eating well.  Medications reviewed and include: folic acid 9622 micrograms daily, magnesium oxide 400 mg BID, Megace 400 mg BID, pantoprazole, thiamine 100 mg daily, NS with KCl 20 mEq/L @ 75 ml/hr, Xanax PRN, Zofran PRN.  Labs reviewed: Potassium 3.2, CO2 21, BUN 5, Magnesium 1.8 after repletion (was 1.2 on admission).  Nutrition-Focused physical exam completed. Findings are severe fat depletion, severe muscle depletion, and no edema. Eyes appear jaundiced.  Discussed with RN.  Diet Order:  Diet regular Room service appropriate? Yes; Fluid consistency: Thin  Skin:  Reviewed, no issues  Last BM:  PTA (04/12/2017 per chart)  Height:   Ht  Readings from Last 1 Encounters:  04/14/17 '5\' 1"'$  (1.549 m)    Weight:   Wt Readings from Last 1 Encounters:  04/15/17 111 lb (50.3 kg)    Ideal Body Weight:  47.7 kg  BMI:  Body mass index is 20.97 kg/m.  Estimated Nutritional Needs:   Kcal:  1510-1760 (30-35 kcal/kg)  Protein:  75-90 grams  (1.5-1.8 grams/kg)  Fluid:  1.5-1.7 L/day   EDUCATION NEEDS:   No education needs identified at this time  Willey Blade, MS, RD, LDN Pager: [phone] After Hours Pager: [phone]

## 2017-04-15 NOTE — Progress Notes (Signed)
MEDICATION RELATED CONSULT NOTE - INITIAL   Pharmacy Consult for electrolyte monitoring Indication: hypokalemia, hypomagnesemia   Allergies  Allergen Reactions  . No Known Allergies     Patient Measurements: Height: '5\' 1"'$  (154.9 cm) Weight: 111 lb (50.3 kg) IBW/kg (Calculated) : 47.8  Vital Signs: Temp: 97.8 F (36.6 C) (04/25 0409) Temp Source: Oral (04/25 0409) BP: 81/50 (04/25 0944) Pulse Rate: 110 (04/25 0944) Intake/Output from previous day: 04/24 0701 - 04/25 0700 In: 678.8 [I.V.:578.8; IV Piggyback:100] Out: -  Intake/Output from this shift: Total I/O In: 240 [P.O.:240] Out: -   Labs: BMP Latest Ref Rng & Units 04/15/2017 04/14/2017 04/14/2017  Glucose 65 - 99 mg/dL 89 - 115(H)  BUN 6 - 20 mg/dL 5(L) - 7  Creatinine 0.44 - 1.00 mg/dL 0.71 - 0.68  Sodium 135 - 145 mmol/L 137 - 135  Potassium 3.5 - 5.1 mmol/L 3.2(L) 3.4(L) 2.3(LL)  Chloride 101 - 111 mmol/L 109 - 99(L)  CO2 22 - 32 mmol/L 21(L) - 24  Calcium 8.9 - 10.3 mg/dL 8.6(L) - 9.2     Estimated Creatinine Clearance: 60.7 mL/min (by C-G formula based on SCr of 0.71 mg/dL).   Microbiology: No results found for this or any previous visit (from the past 720 hour(s)).  Medical History: Past Medical History:  Diagnosis Date  . COPD (chronic obstructive pulmonary disease) ([place])   . Dementia   . Hypertension   . Lung cancer ([place])   . Thyroid disease     Assessment: Pharmacy consulted to monitor and dose electrolytes in this 45 yoF with metastatic non-small cell lung cancer presenting with SOB, weakness and n/v over the past week. Pt currently receiving chemotherapy for lung cancer.  4/25 Mag=1.8, K=3.2  Goal of Therapy:  Electrolytes WNL  Plan:  Will order KCl 40 mEq PO x one dose and follow up with AM BMET and Magnesium.  Audrey Mcgee, PharmD, BCPS 04/15/2017 10:48 AM

## 2017-04-15 NOTE — Progress Notes (Signed)
No family at bedside. Unable to leave voicemail on daughter, Maree Erie, phone. Palliative NP will attempt to contact family again tomorrow, 4/26 for [place] conversation.  NO CHARGE  Ihor Dow, FNP-C Palliative Medicine Team  Phone: [phone] Fax: [phone]

## 2017-04-16 DIAGNOSIS — R0602 Shortness of breath: Secondary | ICD-10-CM

## 2017-04-16 DIAGNOSIS — Z79899 Other long term (current) drug therapy: Secondary | ICD-10-CM

## 2017-04-16 DIAGNOSIS — C7800 Secondary malignant neoplasm of unspecified lung: Secondary | ICD-10-CM

## 2017-04-16 DIAGNOSIS — Z803 Family history of malignant neoplasm of breast: Secondary | ICD-10-CM

## 2017-04-16 DIAGNOSIS — C7972 Secondary malignant neoplasm of left adrenal gland: Secondary | ICD-10-CM

## 2017-04-16 DIAGNOSIS — Z7189 Other specified counseling: Secondary | ICD-10-CM

## 2017-04-16 DIAGNOSIS — F0390 Unspecified dementia without behavioral disturbance: Secondary | ICD-10-CM

## 2017-04-16 DIAGNOSIS — C3411 Malignant neoplasm of upper lobe, right bronchus or lung: Secondary | ICD-10-CM

## 2017-04-16 DIAGNOSIS — E46 Unspecified protein-calorie malnutrition: Secondary | ICD-10-CM

## 2017-04-16 DIAGNOSIS — E876 Hypokalemia: Principal | ICD-10-CM

## 2017-04-16 DIAGNOSIS — J449 Chronic obstructive pulmonary disease, unspecified: Secondary | ICD-10-CM

## 2017-04-16 DIAGNOSIS — R112 Nausea with vomiting, unspecified: Secondary | ICD-10-CM

## 2017-04-16 DIAGNOSIS — R5383 Other fatigue: Secondary | ICD-10-CM

## 2017-04-16 DIAGNOSIS — F17210 Nicotine dependence, cigarettes, uncomplicated: Secondary | ICD-10-CM

## 2017-04-16 DIAGNOSIS — E079 Disorder of thyroid, unspecified: Secondary | ICD-10-CM

## 2017-04-16 DIAGNOSIS — Z515 Encounter for palliative care: Secondary | ICD-10-CM

## 2017-04-16 DIAGNOSIS — F4323 Adjustment disorder with mixed anxiety and depressed mood: Secondary | ICD-10-CM

## 2017-04-16 LAB — BASIC METABOLIC PANEL
Anion gap: 1 — ABNORMAL LOW (ref 5–15)
CALCIUM: 8.7 mg/dL — AB (ref 8.9–10.3)
CO2: 23 mmol/L (ref 22–32)
CREATININE: 0.49 mg/dL (ref 0.44–1.00)
Chloride: 114 mmol/L — ABNORMAL HIGH (ref 101–111)
Glucose, Bld: 63 mg/dL — ABNORMAL LOW (ref 65–99)
Potassium: 4 mmol/L (ref 3.5–5.1)
SODIUM: 138 mmol/L (ref 135–145)

## 2017-04-16 LAB — MAGNESIUM: Magnesium: 1.5 mg/dL — ABNORMAL LOW (ref 1.7–2.4)

## 2017-04-16 LAB — PHOSPHORUS: Phosphorus: 2.6 mg/dL (ref 2.5–4.6)

## 2017-04-16 MED ORDER — MAGNESIUM SULFATE 2 GM/50ML IV SOLN
2.0000 g | Freq: Once | INTRAVENOUS | Status: AC
  Administered 2017-04-16: 09:00:00 2 g via INTRAVENOUS
  Filled 2017-04-16: qty 50
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
## 2017-04-16 NOTE — Progress Notes (Signed)
[place] [facility] [place] NAME: Audrey Mcgee    MR#:  [id number]  DATE OF BIRTH:  04/03/1961  SUBJECTIVE:  CHIEF COMPLAINT:   Chief Complaint  Patient presents with  . Shortness of Breath   -  Feels weak and tired today. - different versions of story to everybody without consistency. Not sure if able to make her own medical decisions. Palliative care trying to reach family  REVIEW OF SYSTEMS:  Review of Systems  Constitutional: Positive for malaise/fatigue and weight loss. Negative for chills and fever.  HENT: Negative for congestion, ear discharge, hearing loss and nosebleeds.   Eyes: Negative for blurred vision and double vision.  Respiratory: Positive for shortness of breath. Negative for cough and wheezing.   Cardiovascular: Negative for chest pain, palpitations and leg swelling.  Gastrointestinal: Negative for abdominal pain, constipation, diarrhea, nausea and vomiting.  Genitourinary: Negative for dysuria.  Musculoskeletal: Negative for myalgias.  Neurological: Negative for dizziness, sensory change, speech change, focal weakness, seizures and headaches.  Psychiatric/Behavioral: Negative for depression.    DRUG ALLERGIES:   Allergies  Allergen Reactions  . No Known Allergies     VITALS:  Blood pressure 106/85, pulse (!) 102, temperature 98 F (36.7 C), temperature source Oral, resp. rate 18, height '5\' 1"'$  (1.549 m), weight 53.7 kg (118 lb 4.8 oz), last menstrual period 01/30/2008, SpO2 100 %.  PHYSICAL EXAMINATION:  Physical Exam  GENERAL:  56 y.o.-year-old malnourished patient lying in the bed with no acute distress.  EYES: Pupils equal, round, reactive to light and accommodation. No scleral icterus. Extraocular muscles intact.  HEENT: Head atraumatic, normocephalic. Oropharynx and nasopharynx clear.  NECK:  Supple, no jugular venous distention. No thyroid enlargement, no tenderness.  LUNGS: Normal breath sounds  bilaterally, no wheezing, rales,rhonchi or crepitation. No use of accessory muscles of respiration. Decreased bibasilar breath sounds. CARDIOVASCULAR: S1, S2 normal. No murmurs, rubs, or gallops.  ABDOMEN: Soft, nontender, nondistended. Bowel sounds present. No organomegaly or mass.  EXTREMITIES: No pedal edema, cyanosis, or clubbing.  NEUROLOGIC: Cranial nerves II through XII are intact. Muscle strength 5/5 in all extremities. Sensation intact. Gait not checked. Global weakness noted PSYCHIATRIC: The patient is alert and oriented x 2. Poor judgment  SKIN: No obvious rash, lesion, or ulcer.    LABORATORY PANEL:   CBC  Recent Labs Lab 04/15/17 0720  WBC 5.5  HGB 10.4*  HCT 32.0*  PLT 244   ------------------------------------------------------------------------------------------------------------------  Chemistries   Recent Labs Lab 04/16/17 0607  NA 138  K 4.0  CL 114*  CO2 23  GLUCOSE 63*  BUN <5*  CREATININE 0.49  CALCIUM 8.7*  MG 1.5*   ------------------------------------------------------------------------------------------------------------------  Cardiac Enzymes  Recent Labs Lab 04/14/17 1056  TROPONINI <0.03   ------------------------------------------------------------------------------------------------------------------  RADIOLOGY:  No results found.  EKG:   Orders placed or performed during the hospital encounter of 04/09/17  . ED EKG  . ED EKG  . EKG 12-Lead  . EKG 12-Lead    ASSESSMENT AND PLAN:   56 year old female with past medical history significant for COPD, hypertension, hypothyroidism, and no carcinoma of lung with recurrence and cavitary lesion now presents to the hospital secondary to shortness of breath and noted to be hypokalemic.  #1 electrolyte abnormalities-hypokalemia and hypomagnesemia-due to poor nutrition. -Being replaced aggressively. Appreciate pharmacy consult. Follow-up -Patient is high risk for refeeding syndrome.  Monitor electrolytes.  #2 severe malnourishment-poor by mouth intake. Also says sometimes she gets depressed and doesn't eat  food. -Dietitian consult. On Megace  #3 shortness of breath-triggered by anxiety episodes. Currently comfortable on room air. Has poorly differentiated non-small cell lung cancer with cavitary lesion. No change on chest x-ray  #4 history of poorly differentiated non-small cell lung cancer-patient thinks she is getting chemotherapy, however no records of chemotherapy. -Oncology consulted. Patient is just discharged from hospice due to her unsafe living conditions. -No recent visits to [facility] as well. -Palliative care consulted. Might need to healthcare guardian and CODE STATUS changes.  #5 hyperthyroidism-continue methimazole  #6 hypertension-discontinue metoprolol as blood pressure is on low normal side.  #7 GERD-Protonix.  #8 DVT prophylaxis-on Lovenox  Physical therapy consulted. Awaiting palliative care to discuss with family. We'll consult psych to see if patient can make her own medical decisions Patient is now want to go to any rehabilitation center.     All the records are reviewed and case discussed with Care Management/Social Workerr. Management plans discussed with the patient, family and they are in agreement.  CODE STATUS: Full code  TOTAL TIME TAKING CARE OF THIS PATIENT: 37 minutes.   POSSIBLE D/C IN 1-2 DAYS, DEPENDING ON CLINICAL CONDITION.   Gladstone Lighter M.D on 04/16/2017 at 1:21 PM  Between 7am to 6pm - Pager - [phone]  After 6pm go to [email or web address] - password [facility]  Office  [phone]  CC: Primary care physician; Donnie Coffin, MD

## 2017-04-16 NOTE — Evaluation (Signed)
Physical Therapy Evaluation Patient Details Name: Audrey Mcgee MRN: [id number] DOB: [birth year] Today's Date: 04/16/2017   History of Present Illness  Pt is a 56 y/o F who presented with SOB.  Pt admitted for hypokalemia.  Pt's PMH includes metastatic lung cancer, dementia, thyroid disease, COPD.    Clinical Impression  Pt admitted with above diagnosis. Pt currently with functional limitations due to the deficits listed below (see PT Problem List). Audrey Mcgee is a poor historian and no family present to confirm pt's home layout or PLOF. She reports having ~1 fall each month.  She ambulates household distances with her RW at baseline and uses her WC for cooking.  She currently requires min assist for short distance ambulation and min guard assist for transfers.  Given pt's current mobility status, recommending SNF at d/c.  Pt will benefit from skilled PT to increase their independence and safety with mobility to allow discharge to the venue listed below.      Follow Up Recommendations SNF    Equipment Recommendations  None recommended by PT (if family can confirm pt has a RW)    Recommendations for Other Services       Precautions / Restrictions Precautions Precautions: Fall Restrictions Weight Bearing Restrictions: No      Mobility  Bed Mobility Overal bed mobility: Needs Assistance Bed Mobility: Supine to Sit     Supine to sit: Min guard;HOB elevated     General bed mobility comments: Increased time and effort with use of bed rail with HOB elevated  Transfers Overall transfer level: Needs assistance Equipment used: Rolling walker (2 wheeled) Transfers: Sit to/from Stand Sit to Stand: Min guard         General transfer comment: Close min guard as pt demonstrates instability with sit<>stand but no LOB.  Cues for proper hand placement.  Ambulation/Gait Ambulation/Gait assistance: Min assist Ambulation Distance (Feet): 50 Feet Assistive device: Rolling  walker (2 wheeled) Gait Pattern/deviations: Step-through pattern;Decreased stride length;Trunk flexed Gait velocity: decreased Gait velocity interpretation: <1.8 ft/sec, indicative of risk for recurrent falls General Gait Details: Pt pushes RW out too far ahead of her and despite max verbal cues for proper technique, she requires min assist to manage RW.  Pt turns with feet outside of BOS of RW.  She fatigues after ambulatng ~30 ft.  Stairs            Wheelchair Mobility    Modified Rankin (Stroke Patients Only)       Balance Overall balance assessment: Needs assistance;History of Falls Sitting-balance support: No upper extremity supported;Feet supported Sitting balance-Leahy Scale: Good     Standing balance support: Bilateral upper extremity supported;During functional activity Standing balance-Leahy Scale: Poor Standing balance comment: Relies on UE support for static and dynamic activities                             Pertinent Vitals/Pain Pain Assessment: 0-10 Pain Score: 7  Pain Location: R thigh (chronic), back Pain Descriptors / Indicators: Aching;Discomfort Pain Intervention(s): Limited activity within patient's tolerance;Monitored during session;Repositioned    Home Living Family/patient expects to be discharged to:: Private residence Living Arrangements: Children Available Help at Discharge: Family;Available 24 hours/day Type of Home: Apartment Home Access: Stairs to enter Entrance Stairs-Rails: None Entrance Stairs-Number of Steps: 3 Home Layout: One level Home Equipment: Walker - 2 wheels;Wheelchair - manual Additional Comments: Information provided by pt with no family present to confirm information.  Prior Function Level of Independence: Needs assistance   Gait / Transfers Assistance Needed: Reports that she ambulates with RW in her home and uses her WC when cooking. Reports ~1 fall/month.  ADL's / Homemaking Assistance Needed: Pt  requires assist for bathing, dressing, cleaning.          Hand Dominance        Extremity/Trunk Assessment   Upper Extremity Assessment Upper Extremity Assessment:  (Strength grossly 3+/5 BUEs)    Lower Extremity Assessment Lower Extremity Assessment:  (Strength grossly 3+/5 BLEs)    Cervical / Trunk Assessment Cervical / Trunk Assessment: Kyphotic  Communication   Communication: No difficulties  Cognition Arousal/Alertness: Awake/alert Behavior During Therapy: WFL for tasks assessed/performed Overall Cognitive Status: No family/caregiver present to determine baseline cognitive functioning                                 General Comments: Pt not oriented to time or situation.        General Comments General comments (skin integrity, edema, etc.): BP reports brief episode of lightheadedness with supine>sit but otherwise denies lightheadedness throughout session.    Exercises General Exercises - Upper Extremity Shoulder Flexion: AROM;Both;5 reps;Seated General Exercises - Lower Extremity Long Arc Quad: Both;10 reps;AROM;Seated   Assessment/Plan    PT Assessment Patient needs continued PT services  PT Problem List Decreased strength;Decreased activity tolerance;Decreased balance;Decreased cognition;Decreased knowledge of use of DME;Decreased safety awareness;Pain       PT Treatment Interventions DME instruction;Gait training;Stair training;Functional mobility training;Therapeutic activities;Therapeutic exercise;Balance training;Neuromuscular re-education;Cognitive remediation;Patient/family education;Wheelchair mobility training    PT Goals (Current goals can be found in the Care Plan section)  Acute Rehab PT Goals Patient Stated Goal: to go home PT Goal Formulation: With patient Time For Goal Achievement: 04/30/17 Potential to Achieve Goals: Fair    Frequency Min 2X/week   Barriers to discharge   Unsure of assist available from family      Co-evaluation               End of Session Equipment Utilized During Treatment: Gait belt Activity Tolerance: Patient limited by fatigue Patient left: in chair;with call bell/phone within reach;with chair alarm set Nurse Communication: Mobility status PT Visit Diagnosis: History of falling (Z91.81);Muscle weakness (generalized) (M62.81);Unsteadiness on feet (R26.81)    Time: 1610-9604 PT Time Calculation (min) (ACUTE ONLY): 28 min   Charges:   PT Evaluation $PT Eval Low Complexity: 1 Procedure PT Treatments $Gait Training: 8-22 mins   PT G Codes:        Collie Siad PT, DPT 04/16/2017, 3:07 PM

## 2017-04-16 NOTE — Progress Notes (Signed)
While rounding, Audrey Mcgee made initial visit to room 108. Pt presented in pain and was uncomfortable with a visit at this time. Audrey Mcgee agreed and will attempt a follow up on next rounding.    04/16/17 1300  Clinical Encounter Type  Visited With Patient  Visit Type Initial;Spiritual support  Consult/Referral To Chaplain  Spiritual Encounters  Spiritual Needs Emotional

## 2017-04-16 NOTE — Progress Notes (Signed)
MEDICATION RELATED CONSULT NOTE - INITIAL   Pharmacy Consult for electrolyte monitoring Indication: hypokalemia, hypomagnesemia   Allergies  Allergen Reactions  . No Known Allergies     Patient Measurements: Height: '5\' 1"'$  (154.9 cm) Weight: 118 lb 4.8 oz (53.7 kg) IBW/kg (Calculated) : 47.8  Vital Signs: Temp: 98 F (36.7 C) (04/26 0448) Temp Source: Oral (04/26 0448) BP: 106/85 (04/26 0849) Pulse Rate: 102 (04/26 0849) Intake/Output from previous day: 04/25 0701 - 04/26 0700 In: 2452 [P.O.:717; I.V.:1735] Out: -  Intake/Output from this shift: Total I/O In: 863.8 [P.O.:600; I.V.:213.8; IV Piggyback:50] Out: -   Labs: BMP Latest Ref Rng & Units 04/16/2017 04/15/2017 04/14/2017  Glucose 65 - 99 mg/dL 63(L) 89 -  BUN 6 - 20 mg/dL <5(L) 5(L) -  Creatinine 0.44 - 1.00 mg/dL 0.49 0.71 -  Sodium 135 - 145 mmol/L 138 137 -  Potassium 3.5 - 5.1 mmol/L 4.0 3.2(L) 3.4(L)  Chloride 101 - 111 mmol/L 114(H) 109 -  CO2 22 - 32 mmol/L 23 21(L) -  Calcium 8.9 - 10.3 mg/dL 8.7(L) 8.6(L) -     Estimated Creatinine Clearance: 60.7 mL/min (by C-G formula based on SCr of 0.49 mg/dL).   Microbiology: No results found for this or any previous visit (from the past 720 hour(s)).  Medical History: Past Medical History:  Diagnosis Date  . COPD (chronic obstructive pulmonary disease) ([place])   . Dementia   . Hypertension   . Lung cancer ([place])   . Thyroid disease     Assessment: Pharmacy consulted to monitor and dose electrolytes in this 76 yoF with metastatic non-small cell lung cancer presenting with SOB, weakness and n/v over the past week. Pt currently receiving chemotherapy for lung cancer.  4/25 Mag=1.5, K=4.0  Goal of Therapy:  Electrolytes WNL  Plan:  Patient has already received Mag Sulfate 2g IV times one dose this morning. Will follow up on AM labs  Paulina Fusi, PharmD, BCPS 04/16/2017 11:25 AM

## 2017-04-16 NOTE — Consult Note (Signed)
[facility] Face-to-Face Psychiatry Consult   Reason for Consult:  Consult for 56 year old woman with multiple medical problems including metastatic cancer. Question about capacity. Referring Physician:  Tressia Miners Patient Identification: Audrey Mcgee MRN:  [id number] Principal Diagnosis: Adjustment disorder with mixed anxiety and depressed mood Diagnosis:   Patient Active Problem List   Diagnosis Date Noted  . Adjustment disorder with mixed anxiety and depressed mood [F43.23] 04/16/2017  . SOB (shortness of breath) [R06.02]   . Protein-calorie malnutrition ([place]) [E46]   . HCAP (healthcare-associated pneumonia) [J18.9] 03/12/2017  . Hyperthyroidism [E05.90] 03/02/2017  . COPD (chronic obstructive pulmonary disease) ([place]) [J44.9] 03/02/2017  . Subacute delirium [F05] 02/18/2017  . Altered mental status [R41.82] 02/12/2017  . Cannabis abuse [F12.10] 02/12/2017  . Nausea [R11.0] 02/12/2017  . Failure to thrive in adult [R62.7] 02/12/2017  . Hypokalemia [E87.6] 02/12/2017  . Fatigue [R53.83] 02/12/2017  . Lung cancer ([place]) [C34.90] 02/12/2017  . Hypothyroidism [E03.9] 02/11/2017  . Malignancy ([place]) [C80.1]   . Palliative care by specialist [Z51.5]   . Goals of care, counseling/discussion [Z71.89]   . DNR (do not resuscitate) discussion [Z71.89]   . Depressive disorder [F32.9]   . Dehydration [E86.0] 02/07/2017  . Weight loss [R63.4] 02/07/2017  . Encounter for antineoplastic chemotherapy [Z51.11] 02/01/2017  . 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 Sepsis ([place]) [A41.9] 11/07/2016  . Protein-calorie malnutrition, severe [E43] 11/07/2016  . Acute respiratory distress [R06.03] 06/21/2016  . COPD exacerbation ([place]) [J44.1] 06/21/2016  . Sinus tachycardia [R00.0] 06/21/2016  . Recurrent aspiration bronchitis/pneumonia ([place]) [J69.0] 06/21/2016  . Dysphagia [R13.10] 06/21/2016  . Nausea and vomiting [R11.2] 06/21/2016  . Dental abscess [K04.7] 05/10/2016  .  Swallowing difficulty [R13.10] 03/28/2016  . Hypomagnesemia [E83.42] 02/29/2016  . Hoarseness [R49.0] 02/18/2016  . Cancer related pain [G89.3] 07/08/2015  . Adenocarcinoma, lung ([place]) [C34.90] 03/26/2015  . 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 Ear ache [H92.09] 10/03/2014  . Abscess of buccal cavity [K12.2] 10/03/2014  . Lump in neck [R22.1] 10/03/2014  . Laryngeal pain [R07.0] 10/03/2014    Total Time spent with patient: 1 hour  Subjective:   Audrey Mcgee is a 56 y.o. female patient admitted with "I know are not going to last long with this cancer".  HPI:  Patient interviewed chart reviewed. I had seen this patient once before the end of February as well. This is a 56 year old woman with metastatic cancer and multiple medical problems. Question evidently was raised about capacity to make decisions. I found the patient awake and alert. She was able to tell me that she came into the hospital because of pain in her back and her front. She is able to tell me that she has cancer in her lungs and in her bones. Patient is aware that her condition is terminal and that she is likely to die from her condition. Possibly in the near future. Patient states that the most important thing to her is to be at home and be around her grandchildren and children area patient is anxious and dysphoric but not hopeless. No suicidal ideation. No psychosis. Patient has some short-term memory impairment and a little bit of confusion but was basically oriented and able to have a reasonable conversation.  Social history: Patient says now that she plans to go stay with her daughter. She evidently has several children involved in her care. She repeats to me the same thing she told the palliative care team which is that her youngest daughter  will be her power of attorney.  Medical history: Patient has lost significant weight. Palliative care referral is been made. Advanced  cancer.  Substance abuse history: None  Past Psychiatric History: No past psychiatric history. When I saw this patient at the end of February it was essentially the same question.  Risk to Self: Is patient at risk for suicide?: No Risk to Others:   Prior Inpatient Therapy:   Prior Outpatient Therapy:    Past Medical History:  Past Medical History:  Diagnosis Date  . COPD (chronic obstructive pulmonary disease) ([place])   . Dementia   . Hypertension   . Lung cancer ([place])   . Thyroid disease     Past Surgical History:  Procedure Laterality Date  . CESAREAN SECTION CLASSICAL    . STOMACH SURGERY     Pt reports for a tumor   Family History:  Family History  Problem Relation Age of Onset  . Hypertension Father   . Cancer Father   . Diabetes Father   . Cancer Maternal Aunt    Family Psychiatric  History: None Social History:  History  Alcohol Use No     History  Drug Use No    Social History   Social History  . Marital status: Single    Spouse name: N/A  . Number of children: N/A  . Years of education: N/A   Social History Main Topics  . Smoking status: Current Every Day Smoker    Packs/day: 0.25    Years: 15.00    Types: Cigarettes  . Smokeless tobacco: Never Used  . Alcohol use No  . Drug use: No  . Sexual activity: Not Asked   Other Topics Concern  . None   Social History Narrative  . None   Additional Social History:    Allergies:   Allergies  Allergen Reactions  . No Known Allergies     Labs:  Results for orders placed or performed during the hospital encounter of 04/14/17 (from the past 48 hour(s))  Potassium     Status: Abnormal   Collection Time: 04/14/17  9:57 PM  Result Value Ref Range   Potassium 3.4 (L) 3.5 - 5.1 mmol/L  Basic metabolic panel     Status: Abnormal   Collection Time: 04/15/17  7:20 AM  Result Value Ref Range   Sodium 137 135 - 145 mmol/L   Potassium 3.2 (L) 3.5 - 5.1 mmol/L   Chloride 109 101 - 111 mmol/L   CO2  21 (L) 22 - 32 mmol/L   Glucose, Bld 89 65 - 99 mg/dL   BUN 5 (L) 6 - 20 mg/dL   Creatinine, Ser 0.71 0.44 - 1.00 mg/dL   Calcium 8.6 (L) 8.9 - 10.3 mg/dL   GFR calc non Af Amer >60 >60 mL/min   GFR calc Af Amer >60 >60 mL/min    Comment: (NOTE) The eGFR has been calculated using the CKD EPI equation. This calculation has not been validated in all clinical situations. eGFR's persistently <60 mL/min signify possible Chronic Kidney Disease.    Anion gap 7 5 - 15  CBC     Status: Abnormal   Collection Time: 04/15/17  7:20 AM  Result Value Ref Range   WBC 5.5 3.6 - 11.0 K/uL   RBC 3.67 (L) 3.80 - 5.20 MIL/uL   Hemoglobin 10.4 (L) 12.0 - 16.0 g/dL   HCT 32.0 (L) 35.0 - 47.0 %   MCV 87.1 80.0 - 100.0 fL   MCH  28.3 26.0 - 34.0 pg   MCHC 32.5 32.0 - 36.0 g/dL   RDW 14.6 (H) 11.5 - 14.5 %   Platelets 244 150 - 440 K/uL  Magnesium     Status: None   Collection Time: 04/15/17  7:20 AM  Result Value Ref Range   Magnesium 1.8 1.7 - 2.4 mg/dL  Basic metabolic panel     Status: Abnormal   Collection Time: 04/16/17  6:07 AM  Result Value Ref Range   Sodium 138 135 - 145 mmol/L    Comment: LYTES REPEATED SGD   Potassium 4.0 3.5 - 5.1 mmol/L   Chloride 114 (H) 101 - 111 mmol/L   CO2 23 22 - 32 mmol/L   Glucose, Bld 63 (L) 65 - 99 mg/dL   BUN <5 (L) 6 - 20 mg/dL   Creatinine, Ser 0.49 0.44 - 1.00 mg/dL   Calcium 8.7 (L) 8.9 - 10.3 mg/dL   GFR calc non Af Amer >60 >60 mL/min   GFR calc Af Amer >60 >60 mL/min    Comment: (NOTE) The eGFR has been calculated using the CKD EPI equation. This calculation has not been validated in all clinical situations. eGFR's persistently <60 mL/min signify possible Chronic Kidney Disease.    Anion gap 1 (L) 5 - 15  Magnesium     Status: Abnormal   Collection Time: 04/16/17  6:07 AM  Result Value Ref Range   Magnesium 1.5 (L) 1.7 - 2.4 mg/dL  Phosphorus     Status: None   Collection Time: 04/16/17  6:07 AM  Result Value Ref Range   Phosphorus 2.6  2.5 - 4.6 mg/dL    Current Facility-Administered Medications  Medication Dose Route Frequency Provider Last Rate Last Dose  . 0.9 % NaCl with KCl 20 mEq/ L  infusion   Intravenous Continuous Bettey Costa, MD 75 mL/hr at 04/16/17 1000    . acetaminophen (TYLENOL) tablet 650 mg  650 mg Oral Q6H PRN Bettey Costa, MD       Or  . acetaminophen (TYLENOL) suppository 650 mg  650 mg Rectal Q6H PRN Bettey Costa, MD      . ALPRAZolam Duanne Moron) tablet 0.25 mg  0.25 mg Oral TID PRN Bettey Costa, MD   0.25 mg at 04/16/17 0851  . bisacodyl (DULCOLAX) EC tablet 5 mg  5 mg Oral Daily PRN Bettey Costa, MD      . enoxaparin (LOVENOX) injection 40 mg  40 mg Subcutaneous Q24H Bettey Costa, MD   40 mg at 04/14/17 1954  . feeding supplement (BOOST / RESOURCE BREEZE) liquid 1 Container  1 Container Oral BID BM Bettey Costa, MD   1 Container at 04/16/17 0904  . folic acid (FOLVITE) tablet 1 mg  1,000 mcg Oral Daily Bettey Costa, MD   1 mg at 04/16/17 0852  . HYDROcodone-acetaminophen (NORCO/VICODIN) 5-325 MG per tablet 1-2 tablet  1-2 tablet Oral Q4H PRN Bettey Costa, MD   1 tablet at 04/16/17 1614  . ipratropium-albuterol (DUONEB) 0.5-2.5 (3) MG/3ML nebulizer solution 3 mL  3 mL Nebulization Q6H Gladstone Lighter, MD   3 mL at 04/16/17 0729  . lidocaine (XYLOCAINE) 2 % viscous mouth solution 10 mL  10 mL Mouth/Throat Q4H PRN Bettey Costa, MD   10 mL at 04/15/17 1941  . magnesium oxide (MAG-OX) tablet 400 mg  400 mg Oral BID Bettey Costa, MD   400 mg at 04/16/17 0852  . megestrol (MEGACE) 400 MG/10ML suspension 400 mg  400 mg Oral BID Sital  [name], MD   400 mg at 04/16/17 0859  . methimazole (TAPAZOLE) tablet 5 mg  5 mg Oral Daily Bettey Costa, MD   5 mg at 04/16/17 0852  . morphine CONCENTRATE 10 MG/0.5ML oral solution 10 mg  10 mg Oral Q4H PRN Bettey Costa, MD   10 mg at 04/15/17 1941  . ondansetron (ZOFRAN) tablet 4 mg  4 mg Oral Q6H PRN Bettey Costa, MD   4 mg at 04/15/17 1004   Or  . ondansetron (ZOFRAN) injection 4 mg  4 mg Intravenous Q6H  PRN Bettey Costa, MD   4 mg at 04/16/17 0303  . pantoprazole (PROTONIX) EC tablet 40 mg  40 mg Oral Daily Bettey Costa, MD   40 mg at 04/16/17 0852  . senna (SENOKOT) tablet 8.6 mg  1 tablet Oral Daily Bettey Costa, MD   8.6 mg at 04/16/17 0852  . sodium chloride flush (NS) 0.9 % injection 3 mL  3 mL Intravenous Q12H Bettey Costa, MD   3 mL at 04/15/17 2000  . thiamine (VITAMIN B-1) tablet 100 mg  100 mg Oral Daily Bettey Costa, MD   100 mg at 04/16/17 0852  . tiotropium (SPIRIVA) inhalation capsule 18 mcg  18 mcg Inhalation Daily Bettey Costa, MD   18 mcg at 04/16/17 0904  . traZODone (DESYREL) tablet 50 mg  50 mg Oral QHS PRN Bettey Costa, MD   50 mg at 04/16/17 0124    Musculoskeletal: Strength & Muscle Tone: decreased Gait & Station: ataxic Patient leans: N/A  Psychiatric Specialty Exam: Physical Exam  ROS  Blood pressure 93/61, pulse (!) 110, temperature 98.1 F (36.7 C), temperature source Oral, resp. rate 18, height 5' 1"  (1.549 m), weight 53.7 kg (118 lb 4.8 oz), last menstrual period 01/30/2008, SpO2 100 %.Body mass index is 22.35 kg/m.  General Appearance: Casual  Eye Contact:  Fair  Speech:  Slow  Volume:  Decreased  Mood:  Anxious  Affect:  Constricted  Thought Process:  Goal Directed  Orientation:  Other:  Patient thought she was in [place] at [facility] and was confused about the year but knew that she was in the hospital and understood the basic situation.  Thought Content:  Rumination  Suicidal Thoughts:  No  Homicidal Thoughts:  No  Memory:  Immediate;   Fair Recent;   Fair Remote;   Fair  Judgement:  Fair  Insight:  Fair  Psychomotor Activity:  Decreased  Concentration:  Concentration: Fair  Recall:  [employer] of Knowledge:  Fair  Language:  Fair  Akathisia:  No  Handed:  Right  AIMS (if indicated):     Assets:  Housing Social Support  ADL's:  Impaired  Cognition:  Impaired,  Mild  Sleep:        Treatment Plan Summary: Plan This is a 56 year old woman with  cancer multiple medical problems needs significant help. She was able to identify to me the major areas in which she needed assistance including bathing. She understood her condition is likely to worsen. Patient understands that a recommendation of been made for rehabilitation or other facility. She states she does not want to do this because she wants to spend as much time as possible around her grandchildren. Patient appears to have full capacity to make reasonable decisions about her care at this point. Supportive care done and counseling. No other psychiatric treatment needed.  Disposition: Patient does not meet criteria for psychiatric inpatient admission. Supportive therapy provided about ongoing stressors.  Alethia Berthold, MD 04/16/2017 6:53 PM

## 2017-04-16 NOTE — Consult Note (Signed)
Consultation Note Date: 04/16/2017   Patient Name: Audrey Mcgee  DOB: June 06, 1961  MRN: [id number]  Age / Sex: 56 y.o., female  PCP: Donnie Coffin, MD Referring Physician: Gladstone Lighter, MD  Reason for Consultation: Establishing goals of care  HPI/Patient Profile: 56 y.o. female  with past medical history of metastatic non-small lung cancer, hyperthyroidism, hypertension, dementia, and COPD admitted on 04/14/2017 with shortness of breath and weakness. In ED, oxygen saturations 100% on room air. Chest xray with no acute infiltrate. Potassium 2.3. Severe malnutrition-dietary consulted. Palliative medicine consultation for goals of care.   Clinical Assessment and Goals of Care: I have reviewed medical records, discussed with care team and met with patient at bedside to discuss diagnosis, GOC, EOL wishes, disposition and options. No family at bedside. Patient awake, alert, and sitting up in recliner eating lunch.   Introduced Palliative Medicine as specialized medical care for people living with serious illness. It focuses on providing relief from the symptoms and stress of a serious illness. The goal is to improve quality of life for both the patient and the family. Patient known to me from previous admissions.   The patient has had multiple admits in the last few months. She was previous receiving hospice services but was discharged due to unsafe living conditions. Patient states "[place] [name] tells me eldest daughter, Audrey Mcgee, lives and cares for her. I asked her if she felt safe in her environment. She states "no but [place] submitted paperwork for government housing" and "we haven't heard back." Paperwork is for [employer].   [name] tells me she gets around majority of the time without a walker. She speaks of "eating a lot  at home" but having days where she is not hungry. Dietary encouraged her to snack between meals and she is willing to do this. States she would not want a feeding tube if it came to this, but the patient is known to be inconsistent with her wishes from previous admissions.    Advanced directives and concepts specific to code status were considered and discussed. Patient states "I want everything." She speaks of surviving resuscitation and life support in the past and would want this attempted again if necessary. Educated on my recommendation for DNR/DNI with frailty, chronic co-morbidities, and underlying cancer.   Asked who she would want to make decisions for her if she was unable to make decisions for herself. Previously, she had told me brother, Vicente Serene. She now tells me she would want youngest daughter, Audrey Mcgee, to be HCPOA. "This is my baby girl and will always be there for me." Strongly encouraged Ms. Man (as I have multiple times on past admissions) to consider designating a HCPOA and completing advanced directive packet with spiritual services while she is at the hospital. She states "I will do it."   Discussed physical therapy recommending rehab on discharge. She refuses to go to a rehab facility but is agreeable to  work with physical therapy at the house when discharged.   Questions and concerns were addressed. Emotional support provided.   SUMMARY OF RECOMMENDATIONS    Patient insists on FULL code. Educated on my recommendation for DNR/DNI with frailty, chronic co-morbidities, and cancer.   Patient tells me she WOULD NOT want a feeding tube if it came to that--agree with RD recommendation for snacks between meals.  Strongly encouraged she consider and discuss completing advanced directives when family is available.   Refusing rehab. Family attempting to get her into [facility]. Will discuss with RN CM in AM.   Psych consult pending.   I have  called brother and two daughters. No return calls. I will attempt to reach them again tomorrow, 4/27.   PMT will continue to support patient/family through hospitalization.   Code Status/Advance Care Planning:  Full code   Symptom Management:   Per attending  Palliative Prophylaxis:   Aspiration, Delirium Protocol, Frequent Pain Assessment, Oral Care and Turn Reposition  Psycho-social/Spiritual:   Desire for further Chaplaincy support:yes  Additional Recommendations: Caregiving  Support/Resources and Education on Hospice  Prognosis:   Unable to determine  Discharge Planning: To Be Determined      Primary Diagnoses: Present on Admission: . Hypokalemia  I have reviewed the medical record, interviewed the patient and family, and examined the patient. The following aspects are pertinent.  Past Medical History:  Diagnosis Date  . COPD (chronic obstructive pulmonary disease) ([place])   . Dementia   . Hypertension   . Lung cancer ([place])   . Thyroid disease    Social History   Social History  . Marital status: Single    Spouse name: N/A  . Number of children: N/A  . Years of education: N/A   Social History Main Topics  . Smoking status: Current Every Day Smoker    Packs/day: 0.25    Years: 15.00    Types: Cigarettes  . Smokeless tobacco: Never Used  . Alcohol use No  . Drug use: No  . Sexual activity: Not Asked   Other Topics Concern  . None   Social History Narrative  . None   Family History  Problem Relation Age of Onset  . Hypertension Father   . Cancer Father   . Diabetes Father   . Cancer Maternal Aunt    Scheduled Meds: . enoxaparin (LOVENOX) injection  40 mg Subcutaneous Q24H  . feeding supplement  1 Container Oral BID BM  . folic acid  4,709 mcg Oral Daily  . ipratropium-albuterol  3 mL Nebulization Q6H  . magnesium oxide  400 mg Oral BID  . megestrol  400 mg Oral BID  . methimazole  5 mg Oral Daily  . pantoprazole  40 mg Oral Daily  .  senna  1 tablet Oral Daily  . sodium chloride flush  3 mL Intravenous Q12H  . thiamine  100 mg Oral Daily  . tiotropium  18 mcg Inhalation Daily   Continuous Infusions: . 0.9 % NaCl with KCl 20 mEq / L 75 mL/hr at 04/16/17 1000   PRN Meds:.acetaminophen **OR** acetaminophen, ALPRAZolam, bisacodyl, HYDROcodone-acetaminophen, lidocaine, morphine CONCENTRATE, ondansetron **OR** ondansetron (ZOFRAN) IV, traZODone Medications Prior to Admission:  Prior to Admission medications   Medication Sig Start Date End Date Taking? Authorizing Provider  ALPRAZolam (XANAX) 0.25 MG tablet Take 1 tablet (0.25 mg total) by mouth 3 (three) times daily as needed for anxiety. 03/05/17  Yes [name], MD  ipratropium-albuterol (DUONEB) 0.5-2.5 (  3) MG/3ML SOLN Take 3 mLs by nebulization every 4 (four) hours. 02/12/17  Yes [name], MD  levothyroxine (SYNTHROID, LEVOTHROID) 100 MCG tablet Take 100 mcg by mouth daily before breakfast.  02/24/17  Yes Historical Provider, MD  magnesium oxide (MAG-OX) 400 MG tablet Take 1 tablet (400 mg total) by mouth 2 (two) times daily. 03/04/17  Yes [name], MD  methimazole (TAPAZOLE) 5 MG tablet Take 1 tablet (5 mg total) by mouth daily. 03/19/17  Yes [name], MD  metoprolol (LOPRESSOR) 50 MG tablet Take 0.5 tablets (25 mg total) by mouth 2 (two) times daily. 03/04/17  Yes [name], MD  Morphine Sulfate (MORPHINE CONCENTRATE) 10 mg / 0.5 ml concentrated solution Take 0.5 mLs (10 mg total) by mouth every 4 (four) hours as needed for severe pain. 03/05/17  Yes [name], MD  omeprazole (PRILOSEC) 20 MG capsule Take 1 capsule (20 mg total) by mouth daily. 06/20/16  Yes Lequita Asal, MD  ondansetron (ZOFRAN) 4 MG tablet Take 1 tablet (4 mg total) by mouth every 6 (six) hours as needed for nausea. 02/12/17  Yes [name], MD  potassium chloride 20 MEQ TBCR Take 1 tab twice a day 03/04/17  Yes [name], MD  traZODone (DESYREL) 50 MG tablet Take 1 tablet  (50 mg total) by mouth at bedtime as needed for sleep. 02/21/17  Yes [name], MD  docusate sodium (COLACE) 100 MG capsule Take 100 mg by mouth 2 (two) times daily as needed for mild constipation.    Historical Provider, MD  feeding supplement (BOOST / RESOURCE BREEZE) LIQD Take 1 Container by mouth 2 (two) times daily between meals. 02/12/17   [name], MD  folic acid (FOLVITE) 161 MCG tablet Take 800 mcg by mouth daily.     Historical Provider, MD  lidocaine (XYLOCAINE) 2 % solution Use as directed 10 mLs in the mouth or throat every 4 (four) hours as needed for mouth pain.    Historical Provider, MD  megestrol (MEGACE) 40 MG/ML suspension Take 10 mLs (400 mg total) by mouth 2 (two) times daily. Patient not taking: Reported on 04/14/2017 02/02/17   Lequita Asal, MD  senna (SENOKOT) 8.6 MG TABS tablet Take 1 tablet (8.6 mg total) by mouth daily. Patient not taking: Reported on 04/14/2017 02/13/17   [name], MD  thiamine (VITAMIN B-1) 100 MG tablet Take 1 tablet (100 mg total) by mouth daily. Patient not taking: Reported on 04/14/2017 02/21/17   [name], MD  tiotropium (SPIRIVA) 18 MCG inhalation capsule Place 1 capsule (18 mcg total) into inhaler and inhale daily. Patient not taking: Reported on 04/14/2017 06/22/16   [name], MD   Allergies  Allergen Reactions  . No Known Allergies    Review of Systems  Respiratory: Positive for shortness of breath.   Neurological: Positive for weakness.   Physical Exam  Constitutional: She is oriented to person, place, and time. She appears cachectic. She is cooperative.  HENT:  Head: Normocephalic and atraumatic.  Cardiovascular: Regular rhythm.   Pulmonary/Chest: Effort normal.  Abdominal: Normal appearance.  Neurological: She is alert and oriented to person, place, and time.  Skin: Skin is warm and dry.  Psychiatric: She has a normal mood and affect. Her speech is normal and behavior is normal.  Nursing note and  vitals reviewed.   Vital Signs: BP 93/61 (BP Location: Left Arm)   Pulse (!) 110   Temp 98.1 F (36.7 C) (Oral)  Resp 18   Ht 5' 1"  (1.549 m)   Wt 53.7 kg (118 lb 4.8 oz)   LMP 01/30/2008 Comment: 9 yrs ago  SpO2 100%   BMI 22.35 kg/m  Pain Assessment: 0-10 POSS *See Group Information*: 1-Acceptable,Awake and alert Pain Score: Asleep   SpO2: SpO2: 100 % O2 Device:SpO2: 100 % O2 Flow Rate: .   IO: Intake/output summary:   Intake/Output Summary (Last 24 hours) at 04/16/17 1600 Last data filed at 04/16/17 1000  Gross per 24 hour  Intake          3075.75 ml  Output                0 ml  Net          3075.75 ml    LBM: Last BM Date: 04/15/17 Baseline Weight: Weight: 49 kg (108 lb) Most recent weight: Weight: 53.7 kg (118 lb 4.8 oz)     Palliative Assessment/Data: PPS 50%   Flowsheet Rows     Most Recent Value  Intake Tab  Referral Department  Hospitalist  Unit at Time of Referral  Oncology Unit  Palliative Care Primary Diagnosis  Pulmonary  Date Notified  04/15/17  Palliative Care Type  Return patient Palliative Care  Reason for referral  Clarify Goals of Care  Date of Admission  04/14/17  Date first seen by Palliative Care  04/16/17  # of days Palliative referral response time  1 Day(s)  # of days IP prior to Palliative referral  1  Clinical Assessment  Palliative Performance Scale Score  50%  Psychosocial & Spiritual Assessment  Palliative Care Outcomes  Patient/Family meeting held?  Yes  Who was at the meeting?  patient. family not available today  Palliative Care Outcomes  Clarified goals of care, Provided psychosocial or spiritual support, ACP counseling assistance, Counseled regarding hospice      Time In: 1450 Time Out: 1600 Time Total: 24mn Greater than 50%  of this time was spent counseling and coordinating care related to the above assessment and plan.  Signed by:  MIhor Dow FNP-C Palliative Medicine Team  Phone: [phone]:  [phone]  Please contact Palliative Medicine Team phone at [phone] questions and concerns.  For individual provider: See AShea Evans

## 2017-04-17 LAB — MAGNESIUM: MAGNESIUM: 1.4 mg/dL — AB (ref 1.7–2.4)

## 2017-04-17 LAB — BASIC METABOLIC PANEL
Anion gap: 6 (ref 5–15)
BUN: <5 mg/dL — ABNORMAL LOW (ref 6–20)
CHLORIDE: 116 mmol/L — AB (ref 101–111)
CO2: 18 mmol/L — AB (ref 22–32)
CREATININE: 0.58 mg/dL (ref 0.44–1.00)
Calcium: 8.6 mg/dL — ABNORMAL LOW (ref 8.9–10.3)
GFR calc non Af Amer: >60 mL/min (ref >60)
Glucose, Bld: 74 mg/dL (ref 65–99)
Potassium: 5 mmol/L (ref 3.5–5.1)
Sodium: 140 mmol/L (ref 135–145)

## 2017-04-17 LAB — PHOSPHORUS: Phosphorus: 2.4 mg/dL — ABNORMAL LOW (ref 2.5–4.6)

## 2017-04-17 LAB — GLUCOSE, CAPILLARY: GLUCOSE-CAPILLARY: 91 mg/dL (ref 65–99)

## 2017-04-17 MED ORDER — MORPHINE SULFATE (CONCENTRATE) 10 MG /0.5 ML PO SOLN: 10.0000 mg | ORAL | 0 refills | Status: DC | PRN

## 2017-04-17 MED ORDER — ALPRAZOLAM 0.25 MG PO TABS: 0.2500 mg | ORAL_TABLET | Freq: Three times a day (TID) | ORAL | 0 refills | Status: DC | PRN

## 2017-04-17 MED ORDER — MAGNESIUM SULFATE 2 GM/50ML IV SOLN
2.0000 g | Freq: Once | INTRAVENOUS | Status: AC
  Administered 2017-04-17: 10:00:00 2 g via INTRAVENOUS
  Filled 2017-04-17: qty 50

## 2017-04-17 NOTE — Clinical Social Work Note (Signed)
Clinical Social Work Assessment  Patient Details  Name: Audrey Mcgee MRN: [id number] Date of Birth: September 05, 1961  Date of referral:  04/17/17               Reason for consult:  Facility Placement, Discharge Planning                Permission sought to share information with:  Family Supports Permission granted to share information::  Yes, Verbal Permission Granted  Name::     Caroline Sauger  Relationship::  daughter  Contact Information:  [phone]  Housing/Transportation Living arrangements for the past 2 months:  Single Family Home Source of Information:  Patient Patient Interpreter Needed:  None Criminal Activity/Legal Involvement Pertinent to Current Situation/Hospitalization:  No - Comment as needed Significant Relationships:  Adult Children, Other Family Members, Significant Other Lives with:  Self Do you feel safe going back to the place where you live?  Yes Need for family participation in patient care:  No (Coment)  Care giving concerns:  Pt would benefit from long term care.   Social Worker assessment / plan:  CSW met with pt to address consult for New SNF. CSW introduced herself and explained role of social work. CSW also explained the process of discharging to SNF with Medicaid Only. Pt shared that she did not want to go to SNF, however did change her mind. However, pt shared that she could go home to her house with her daughter or another daughter. Pt was unclear if she would like to discharge to SNF or to home. If pt were to discharge home, pt would benefit from a home health social worker, if able by insurance. CSW updated MD of discharge plan. Pt has bed offers. CSW will continue to follow.   Employment status:  Retired Forensic scientist:  Medicaid In [place] PT Recommendations:  [place] / Referral to community resources:  [place]  Patient/Family's Response to care:  Pt was appreciative of CSW support.    Patient/Family's Understanding of and Emotional Response to Diagnosis, Current Treatment, and Prognosis:  Pt understands that she would benefit from placecment, however would like to spend time with family.   Emotional Assessment Appearance:  Appears stated age Attitude/Demeanor/Rapport:   (Appropriate) Affect (typically observed):  Accepting, Adaptable, Pleasant Orientation:  Oriented to Self, Oriented to Place, Oriented to  Time, Oriented to Situation Alcohol / Substance use:  Not Applicable Psych involvement (Current and /or in the community):  Yes (Comment) (Pt has decision making capacity)  Discharge Needs  Concerns to be addressed:  Adjustment to Illness Readmission within the last 30 days:   No Current discharge risk:  Chronically ill Barriers to Discharge:  Continued Medical Work up   Darden Dates, LCSW 04/17/2017, 5:31 PM

## 2017-04-17 NOTE — Progress Notes (Signed)
[facility] for electrolyte monitoring Indication: hypokalemia, hypomagnesemia   Allergies  Allergen Reactions  . No Known Allergies     Patient Measurements: Height: '5\' 1"'$  (154.9 cm) Weight: 115 lb 12.8 oz (52.5 kg) IBW/kg (Calculated) : 47.8  Vital Signs: Temp: 98.5 F (36.9 C) (04/27 0409) Temp Source: Oral (04/27 0409) BP: 141/92 (04/27 0527) Pulse Rate: 110 (04/27 0527) Intake/Output from previous day: 04/26 0701 - 04/27 0700 In: 3005.8 [P.O.:1317; I.V.:1638.8; IV Piggyback:50] Out: 250 [Urine:250] Intake/Output from this shift: No intake/output data recorded.  Labs: BMP Latest Ref Rng & Units 04/17/2017 04/16/2017 04/15/2017  Glucose 65 - 99 mg/dL 74 63(L) 89  BUN 6 - 20 mg/dL <5(L) <5(L) 5(L)  Creatinine 0.44 - 1.00 mg/dL 0.58 0.49 0.71  Sodium 135 - 145 mmol/L 140 138 137  Potassium 3.5 - 5.1 mmol/L 5.0 4.0 3.2(L)  Chloride 101 - 111 mmol/L 116(H) 114(H) 109  CO2 22 - 32 mmol/L 18(L) 23 21(L)  Calcium 8.9 - 10.3 mg/dL 8.6(L) 8.7(L) 8.6(L)     Estimated Creatinine Clearance: 60.7 mL/min (by C-G formula based on SCr of 0.58 mg/dL).   Microbiology: No results found for this or any previous visit (from the past 720 hour(s)).  Medical History: Past Medical History:  Diagnosis Date  . COPD (chronic obstructive pulmonary disease) ([place])   . Dementia   . Hypertension   . Lung cancer ([place])   . Thyroid disease     Assessment: Pharmacy consulted to monitor and dose electrolytes in this 23 yoF with metastatic non-small cell lung cancer presenting with SOB, weakness and n/v over the past week. Pt currently receiving chemotherapy for lung cancer.  4/25 Mag=1.4, K=5.0, Phos=2.4  Goal of Therapy:  Electrolytes WNL  Plan:  Will order Mag Sulfate 2g IV times one dose. Will follow up with MD about removing KCl from IV fluids. Will follow up on AM labs.  Paulina Fusi, PharmD, BCPS 04/17/2017 9:36 AM

## 2017-04-17 NOTE — Clinical Social Work Placement (Signed)
   CLINICAL SOCIAL WORK PLACEMENT  NOTE  Date:  04/17/2017  Patient Details  Name: Audrey Mcgee MRN: [id number] Date of Birth: 1961-10-15  Clinical Social Work is seeking post-discharge placement for this patient at the [place] level of care (*CSW will initial, date and re-position this form in  chart as items are completed):  Yes   Patient/family provided with [facility] Work Department's list of facilities offering this level of care within the geographic area requested by the patient (or if unable, by the patient's family).  Yes   Patient/family informed of their freedom to choose among providers that offer the needed level of care, that participate in Medicare, Medicaid or managed care program needed by the patient, have an available bed and are willing to accept the patient.  Yes   Patient/family informed of [facility]'s ownership interest in [facility] and [facility], as well as of the fact that they are under no obligation to receive care at these facilities.  PASRR submitted to EDS on       PASRR number received on       Existing PASRR number confirmed on 04/17/17     FL2 transmitted to all facilities in geographic area requested by pt/family on       FL2 transmitted to all facilities within larger geographic area on       Patient informed that his/her managed care company has contracts with or will negotiate with certain facilities, including the following:        Yes   Patient/family informed of bed offers received.  Patient chooses bed at       Physician recommends and patient chooses bed at      Patient to be transferred to   on  .  Patient to be transferred to facility by       Patient family notified on   of transfer.  Name of family member notified:        PHYSICIAN       Additional Comment:    _______________________________________________ Darden Dates, LCSW 04/17/2017, 5:25 PM

## 2017-04-17 NOTE — Progress Notes (Signed)
AM labs blood sugar 63.  Gave pt apple juice to drink..  Will recheck in 15 minutes. Dorna Bloom RN

## 2017-04-17 NOTE — Discharge Summary (Signed)
[place] [facility] [place] NAME: Audrey Mcgee    MR#:  [id number]  DATE OF BIRTH:  1961-01-03  DATE OF ADMISSION:  04/14/2017   ADMITTING PHYSICIAN: Bettey Costa, MD  DATE OF DISCHARGE: 04/17/2017  PRIMARY CARE PHYSICIAN: Donnie Coffin, MD   ADMISSION DIAGNOSIS:   Hypokalemia [E87.6] SOB (shortness of breath) [R06.02] Fatigue, unspecified type [R53.83]  DISCHARGE DIAGNOSIS:   Principal Problem:   Adjustment disorder with mixed anxiety and depressed mood Active Problems:   Hypokalemia   SOB (shortness of breath)   Protein-calorie malnutrition ([place])   SECONDARY DIAGNOSIS:   Past Medical History:  Diagnosis Date  . COPD (chronic obstructive pulmonary disease) ([place])   . Dementia   . Hypertension   . Lung cancer ([place])   . Thyroid disease     HOSPITAL COURSE:   56 year old female with past medical history significant for COPD, hypertension, hypothyroidism, and no carcinoma of lung with recurrence and cavitary lesion now presents to the hospital secondary to shortness of breath and noted to be hypokalemic.  #1 electrolyte abnormalities-hypokalemia and hypomagnesemia-due to poor nutrition. -replaced aggressively. Appreciate pharmacy consult. Much improved electrolytes -Patient is high risk for refeeding syndrome. Monitor electrolytes.  #2 severe malnourishment-poor by mouth intake. Also says sometimes she gets depressed and doesn't eat food. -Dietitian consult. On Megace -Improved nutrition while in the hospital  #3 shortness of breath-triggered by anxiety episodes. Currently comfortable on room air. Has poorly differentiated non-small cell lung cancer with cavitary lesion. No change on chest x-ray  #4 history of poorly differentiated non-small cell lung cancer-  patient has not been getting any chemotherapy due to generalized weakness and poor health. -Oncology consulted. Patient is just discharged from hospice due to her  unsafe living conditions. -No recent visits to [facility] as well. -Palliative care consulted. Might need to healthcare guardian and CODE STATUS changes. -Patient deemed capable to make her own medical conditions according to psych. She does not want hospice at this time and wants to remain full code.  #5 hyperthyroidism-continue methimazole  #6 hypertension-discontinue metoprolol as blood pressure is on low normal side.  #7 GERD-Protonix.   Physical therapy consulted. Recommended rehabilitation. Patient has adamantly refused rehabilitation in the past. She was seen ambulating to the bathroom with 1 person assist today. -Hospice signed off. And patient does not want hospice at this time. On palliative care unable to reach family. Patient wants to be full code. -Psych has deemed that patient is capable to make her own medical decisions. -Plan to discharge her today however patient said that she wants to go to rehabilitation at this time. Social worker consulted  DISCHARGE CONDITIONS:   Guarded  CONSULTS OBTAINED:   Treatment Team:  Lequita Asal, MD Gonzella Lex, MD  DRUG ALLERGIES:   Allergies  Allergen Reactions  . No Known Allergies    DISCHARGE MEDICATIONS:   Allergies as of 04/17/2017      Reactions   No Known Allergies       Medication List    STOP taking these medications   senna 8.6 MG Tabs tablet Commonly known as:  SENOKOT     TAKE these medications   ALPRAZolam 0.25 MG tablet Commonly known as:  XANAX Take 1 tablet (0.25 mg total) by mouth 3 (three) times daily as needed for anxiety.   docusate sodium 100 MG capsule Commonly known as:  COLACE Take 100 mg by mouth 2 (two) times daily as needed for  mild constipation.   feeding supplement Liqd Take 1 Container by mouth 2 (two) times daily between meals.   folic acid 237 MCG tablet Commonly known as:  FOLVITE Take 800 mcg by mouth daily.   ipratropium-albuterol 0.5-2.5 (3) MG/3ML  Soln Commonly known as:  DUONEB Take 3 mLs by nebulization every 4 (four) hours.   levothyroxine 100 MCG tablet Commonly known as:  SYNTHROID, LEVOTHROID Take 100 mcg by mouth daily before breakfast.   lidocaine 2 % solution Commonly known as:  XYLOCAINE Use as directed 10 mLs in the mouth or throat every 4 (four) hours as needed for mouth pain.   magnesium oxide 400 MG tablet Commonly known as:  MAG-OX Take 1 tablet (400 mg total) by mouth 2 (two) times daily.   megestrol 40 MG/ML suspension Commonly known as:  MEGACE Take 10 mLs (400 mg total) by mouth 2 (two) times daily.   methimazole 5 MG tablet Commonly known as:  TAPAZOLE Take 1 tablet (5 mg total) by mouth daily.   metoprolol 50 MG tablet Commonly known as:  LOPRESSOR Take 0.5 tablets (25 mg total) by mouth 2 (two) times daily.   morphine CONCENTRATE 10 mg / 0.5 ml concentrated solution Take 0.5 mLs (10 mg total) by mouth every 4 (four) hours as needed for severe pain.   omeprazole 20 MG capsule Commonly known as:  PRILOSEC Take 1 capsule (20 mg total) by mouth daily.   ondansetron 4 MG tablet Commonly known as:  ZOFRAN Take 1 tablet (4 mg total) by mouth every 6 (six) hours as needed for nausea.   Potassium Chloride ER 20 MEQ Tbcr Take 1 tab twice a day   thiamine 100 MG tablet Commonly known as:  VITAMIN B-1 Take 1 tablet (100 mg total) by mouth daily.   tiotropium 18 MCG inhalation capsule Commonly known as:  SPIRIVA Place 1 capsule (18 mcg total) into inhaler and inhale daily.   traZODone 50 MG tablet Commonly known as:  DESYREL Take 1 tablet (50 mg total) by mouth at bedtime as needed for sleep.        DISCHARGE INSTRUCTIONS:   1. PCP f/u in 1-2 weeks  DIET:   Regular diet  ACTIVITY:   Activity as tolerated  OXYGEN:   Home Oxygen: No.  Oxygen Delivery: room air  DISCHARGE LOCATION:   home   If you experience worsening of your admission symptoms, develop shortness of breath,  life threatening emergency, suicidal or homicidal thoughts you must seek medical attention immediately by calling 911 or calling your MD immediately  if symptoms less severe.  You Must read complete instructions/literature along with all the possible adverse reactions/side effects for all the Medicines you take and that have been prescribed to you. Take any new Medicines after you have completely understood and accpet all the possible adverse reactions/side effects.   Please note  You were cared for by a hospitalist during your hospital stay. If you have any questions about your discharge medications or the care you received while you were in the hospital after you are discharged, you can call the unit and asked to speak with the hospitalist on call if the hospitalist that took care of you is not available. Once you are discharged, your primary care physician will handle any further medical issues. Please note that NO REFILLS for any discharge medications will be authorized once you are discharged, as it is imperative that you return to your primary care physician (or establish a relationship  with a primary care physician if you do not have one) for your aftercare needs so that they can reassess your need for medications and monitor your lab values.    On the day of Discharge:  VITAL SIGNS:   Blood pressure (!) 141/92, pulse (!) 110, temperature 98.5 F (36.9 C), temperature source Oral, resp. rate 18, height '5\' 1"'$  (1.549 m), weight 52.5 kg (115 lb 12.8 oz), last menstrual period 01/30/2008, SpO2 100 %.  PHYSICAL EXAMINATION:   GENERAL:  56 y.o.-year-old malnourished patient lying in the bed with no acute distress.  EYES: Pupils equal, round, reactive to light and accommodation. No scleral icterus. Extraocular muscles intact.  HEENT: Head atraumatic, normocephalic. Oropharynx and nasopharynx clear.  NECK:  Supple, no jugular venous distention. No thyroid enlargement, no tenderness.  LUNGS:  Normal breath sounds bilaterally, no wheezing, rales,rhonchi or crepitation. No use of accessory muscles of respiration. Decreased bibasilar breath sounds. CARDIOVASCULAR: S1, S2 normal. No murmurs, rubs, or gallops.  ABDOMEN: Soft, nontender, nondistended. Bowel sounds present. No organomegaly or mass.  EXTREMITIES: No pedal edema, cyanosis, or clubbing.  NEUROLOGIC: Cranial nerves II through XII are intact. Muscle strength 5/5 in all extremities. Sensation intact. Gait not checked. Global weakness noted PSYCHIATRIC: The patient is alert and oriented x 3. Poor judgment  SKIN: No obvious rash, lesion, or ulcer.   DATA REVIEW:   CBC  Recent Labs Lab 04/15/17 0720  WBC 5.5  HGB 10.4*  HCT 32.0*  PLT 244    Chemistries   Recent Labs Lab 04/17/17 0604  NA 140  K 5.0  CL 116*  CO2 18*  GLUCOSE 74  BUN <5*  CREATININE 0.58  CALCIUM 8.6*  MG 1.4*     Microbiology Results  Results for orders placed or performed during the hospital encounter of 03/12/17  Blood culture (routine x 2)     Status: None   Collection Time: 03/12/17 10:18 PM  Result Value Ref Range Status   Specimen Description BLOOD R HAND  Final   Special Requests BOTTLES DRAWN AEROBIC AND ANAEROBIC BCAV  Final   Culture NO GROWTH 5 DAYS  Final   Report Status 03/17/2017 FINAL  Final  Blood culture (routine x 2)     Status: None   Collection Time: 03/12/17 10:18 PM  Result Value Ref Range Status   Specimen Description BLOOD L AC  Final   Special Requests BOTTLES DRAWN AEROBIC AND ANAEROBIC BCAV  Final   Culture NO GROWTH 5 DAYS  Final   Report Status 03/17/2017 FINAL  Final  Acid Fast Smear (AFB)     Status: None   Collection Time: 03/12/17 11:16 PM  Result Value Ref Range Status   AFB Specimen Processing Concentration  Final   Acid Fast Smear Negative  Final    Comment: (NOTE) Performed At: [facility] [place], [place] [id number] Lindon Romp MD AT:[id number]    Source  (AFB) SPU  Final  MRSA PCR Screening     Status: None   Collection Time: 03/13/17  4:58 PM  Result Value Ref Range Status   MRSA by PCR NEGATIVE NEGATIVE Final    Comment:        The GeneXpert MRSA Assay (FDA approved for NASAL specimens only), is one component of a comprehensive MRSA colonization surveillance program. It is not intended to diagnose MRSA infection nor to guide or monitor treatment for MRSA infections.     RADIOLOGY:  No results found.   Management  plans discussed with the patient, family and they are in agreement.  CODE STATUS:     Code Status Orders        Start     Ordered   04/14/17 1455  Full code  Continuous     04/14/17 1454    Code Status History    Date Active Date Inactive Code Status Order ID Comments User Context   03/13/2017  2:15 AM 03/18/2017  6:52 PM Full Code [id number]  Lance Coon, MD Inpatient   03/02/2017 11:01 PM 03/04/2017  3:20 PM Full Code [id number]  Lance Coon, MD Inpatient   02/07/2017 10:26 AM 02/21/2017  8:14 PM Full Code [id number]  Saundra Shelling, MD Inpatient   11/07/2016  6:18 AM 11/11/2016  2:39 PM Full Code [id number]  Harrie Foreman, MD Inpatient   06/21/2016 11:52 AM 06/22/2016  3:07 PM Full Code [id number]  [name], MD Inpatient      TOTAL TIME TAKING CARE OF THIS PATIENT: 38 minutes.    Gladstone Lighter M.D on 04/17/2017 at 2:25 PM  Between 7am to 6pm - Pager - [phone]  After 6pm go to [email or web address] - Proofreader  [facility]  Office  [phone]  CC: Primary care physician; Donnie Coffin, MD   Note: This dictation was prepared with Dragon dictation along with smaller phrase technology. Any transcriptional errors that result from this process are unintentional.

## 2017-04-17 NOTE — NC FL2 (Signed)
[place] LEVEL OF CARE SCREENING TOOL     IDENTIFICATION  Patient Name: Audrey Mcgee Birthdate: [birth year] Sex: female Admission Date (Current Location): 04/14/2017  [place] and [place] Number:  Engineering geologist and Address:  [facility], [address], [place], [place] [address]      Provider Number: [id number]  Attending Physician Name and Address:  Gladstone Lighter, MD  Relative Name and Phone Number:       Current Level of Care: Hospital Recommended Level of Care: [place] Prior Approval Number:    Date Approved/Denied:   PASRR Number: [id number]  Discharge Plan: SNF    Current Diagnoses: Patient Active Problem List   Diagnosis Date Noted  . Adjustment disorder with mixed anxiety and depressed mood 04/16/2017  . SOB (shortness of breath)   . Protein-calorie malnutrition ([place])   . HCAP (healthcare-associated pneumonia) 03/12/2017  . Hyperthyroidism 03/02/2017  . COPD (chronic obstructive pulmonary disease) ([place]) 03/02/2017  . Subacute delirium 02/18/2017  . Altered mental status 02/12/2017  . Cannabis abuse 02/12/2017  . Nausea 02/12/2017  . Failure to thrive in adult 02/12/2017  . Hypokalemia 02/12/2017  . Fatigue 02/12/2017  . Lung cancer ([place]) 02/12/2017  . Hypothyroidism 02/11/2017  . Malignancy ([place])   . Palliative care by specialist   . Goals of care, counseling/discussion   . DNR (do not resuscitate) discussion   . Depressive disorder   . Dehydration 02/07/2017  . Weight loss 02/07/2017  . Encounter for antineoplastic chemotherapy 02/01/2017  . 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 Sepsis ([place]) 11/07/2016  . Protein-calorie malnutrition, severe 11/07/2016  . Acute respiratory distress 06/21/2016  . COPD exacerbation ([place]) 06/21/2016  . Sinus tachycardia 06/21/2016  . Recurrent aspiration bronchitis/pneumonia ([place]) 06/21/2016  . Dysphagia 06/21/2016  . Nausea and  vomiting 06/21/2016  . Dental abscess 05/10/2016  . Swallowing difficulty 03/28/2016  . Hypomagnesemia 02/29/2016  . Hoarseness 02/18/2016  . Cancer related pain 07/08/2015  . Adenocarcinoma, lung ([place]) 03/26/2015  . 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 Ear ache 10/03/2014  . Abscess of buccal cavity 10/03/2014  . Lump in neck 10/03/2014  . Laryngeal pain 10/03/2014    Orientation RESPIRATION BLADDER Height & Weight     Self, Time, Situation, Place  Normal Continent Weight: 115 lb 12.8 oz (52.5 kg) Height:  '5\' 1"'$  (154.9 cm)  BEHAVIORAL SYMPTOMS/MOOD NEUROLOGICAL BOWEL NUTRITION STATUS      Continent Diet (Regular Diet)  AMBULATORY STATUS COMMUNICATION OF NEEDS Skin   Limited Assist Verbally Normal                       Personal Care Assistance Level of Assistance  Bathing, Feeding, Dressing Bathing Assistance: Limited assistance Feeding assistance: Independent Dressing Assistance: Limited assistance     Functional Limitations Info  Sight, Hearing, Speech Sight Info: Adequate Hearing Info: Adequate Speech Info: Adequate    SPECIAL CARE FACTORS FREQUENCY  PT (By licensed PT)                    Contractures Contractures Info: Not present    Additional Factors Info  Code Status, Allergies Code Status Info: Full Code Allergies Info: No known allergies           Current Medications (04/17/2017):  This is the current hospital active medication list Current Facility-Administered Medications  Medication Dose Route Frequency Provider Last Rate Last Dose  . acetaminophen (TYLENOL) tablet  650 mg  650 mg Oral Q6H PRN Bettey Costa, MD       Or  . acetaminophen (TYLENOL) suppository 650 mg  650 mg Rectal Q6H PRN Bettey Costa, MD      . ALPRAZolam Duanne Moron) tablet 0.25 mg  0.25 mg Oral TID PRN Bettey Costa, MD   0.25 mg at 04/17/17 0539  . bisacodyl (DULCOLAX) EC tablet 5 mg  5 mg Oral Daily PRN Bettey Costa, MD      . enoxaparin (LOVENOX)  injection 40 mg  40 mg Subcutaneous Q24H Bettey Costa, MD   40 mg at 04/16/17 2234  . feeding supplement (BOOST / RESOURCE BREEZE) liquid 1 Container  1 Container Oral BID BM Bettey Costa, MD   1 Container at 04/17/17 1340  . folic acid (FOLVITE) tablet 1 mg  1,000 mcg Oral Daily Bettey Costa, MD   1 mg at 04/17/17 1013  . HYDROcodone-acetaminophen (NORCO/VICODIN) 5-325 MG per tablet 1-2 tablet  1-2 tablet Oral Q4H PRN Bettey Costa, MD   1 tablet at 04/17/17 1013  . ipratropium-albuterol (DUONEB) 0.5-2.5 (3) MG/3ML nebulizer solution 3 mL  3 mL Nebulization Q6H Gladstone Lighter, MD   3 mL at 04/17/17 1338  . lidocaine (XYLOCAINE) 2 % viscous mouth solution 10 mL  10 mL Mouth/Throat Q4H PRN Bettey Costa, MD   10 mL at 04/15/17 1941  . magnesium oxide (MAG-OX) tablet 400 mg  400 mg Oral BID Bettey Costa, MD   400 mg at 04/17/17 1013  . megestrol (MEGACE) 400 MG/10ML suspension 400 mg  400 mg Oral BID Bettey Costa, MD   400 mg at 04/17/17 1013  . methimazole (TAPAZOLE) tablet 5 mg  5 mg Oral Daily Bettey Costa, MD   5 mg at 04/17/17 1013  . morphine CONCENTRATE 10 MG/0.5ML oral solution 10 mg  10 mg Oral Q4H PRN Bettey Costa, MD   10 mg at 04/15/17 1941  . ondansetron (ZOFRAN) tablet 4 mg  4 mg Oral Q6H PRN Bettey Costa, MD   4 mg at 04/15/17 1004   Or  . ondansetron (ZOFRAN) injection 4 mg  4 mg Intravenous Q6H PRN Bettey Costa, MD   4 mg at 04/16/17 0303  . pantoprazole (PROTONIX) EC tablet 40 mg  40 mg Oral Daily Bettey Costa, MD   40 mg at 04/17/17 1015  . senna (SENOKOT) tablet 8.6 mg  1 tablet Oral Daily Sital [name], MD   8.6 mg at 04/17/17 1013  . sodium chloride flush (NS) 0.9 % injection 3 mL  3 mL Intravenous Q12H Sital [name], MD   3 mL at 04/17/17 1025  . thiamine (VITAMIN B-1) tablet 100 mg  100 mg Oral Daily Sital [name], MD   100 mg at 04/17/17 1013  . tiotropium (SPIRIVA) inhalation capsule 18 mcg  18 mcg Inhalation Daily Bettey Costa, MD   18 mcg at 04/17/17 1013  . traZODone (DESYREL) tablet 50 mg  50 mg Oral QHS  PRN Bettey Costa, MD   50 mg at 04/16/17 2341     Discharge Medications: Please see discharge summary for a list of discharge medications.  Relevant Imaging Results:  Relevant Lab Results:   Additional Information SSN:  [id number]  Darden Dates, LCSW

## 2017-04-17 NOTE — Progress Notes (Signed)
[place] [facility] [place] NAME: Audrey Mcgee    MR#:  [id number]  DATE OF BIRTH:  Oct 16, 1961  SUBJECTIVE:  CHIEF COMPLAINT:   Chief Complaint  Patient presents with  . Shortness of Breath   -  Eating better in the hospital. Hospital discharge today. -Patient wants to look into rehabilitation options.  REVIEW OF SYSTEMS:  Review of Systems  Constitutional: Positive for malaise/fatigue and weight loss. Negative for chills and fever.  HENT: Negative for congestion, ear discharge, hearing loss and nosebleeds.   Eyes: Negative for blurred vision and double vision.  Respiratory: Negative for cough, shortness of breath and wheezing.   Cardiovascular: Negative for chest pain, palpitations and leg swelling.  Gastrointestinal: Negative for abdominal pain, constipation, diarrhea, nausea and vomiting.  Genitourinary: Negative for dysuria.  Musculoskeletal: Negative for myalgias.  Neurological: Negative for dizziness, sensory change, speech change, focal weakness, seizures and headaches.  Psychiatric/Behavioral: Negative for depression.    DRUG ALLERGIES:   Allergies  Allergen Reactions  . No Known Allergies     VITALS:  Blood pressure (!) 141/92, pulse (!) 110, temperature 98.5 F (36.9 C), temperature source Oral, resp. rate 18, height '5\' 1"'$  (1.549 m), weight 52.5 kg (115 lb 12.8 oz), last menstrual period 01/30/2008, SpO2 100 %.  PHYSICAL EXAMINATION:  Physical Exam  GENERAL:  56 y.o.-year-old malnourished patient lying in the bed with no acute distress.  EYES: Pupils equal, round, reactive to light and accommodation. No scleral icterus. Extraocular muscles intact.  HEENT: Head atraumatic, normocephalic. Oropharynx and nasopharynx clear.  NECK:  Supple, no jugular venous distention. No thyroid enlargement, no tenderness.  LUNGS: Normal breath sounds bilaterally, no wheezing, rales,rhonchi or crepitation. No use of accessory muscles of  respiration. Decreased bibasilar breath sounds. CARDIOVASCULAR: S1, S2 normal. No murmurs, rubs, or gallops.  ABDOMEN: Soft, nontender, nondistended. Bowel sounds present. No organomegaly or mass.  EXTREMITIES: No pedal edema, cyanosis, or clubbing.  NEUROLOGIC: Cranial nerves II through XII are intact. Muscle strength 5/5 in all extremities. Sensation intact. Gait not checked. Global weakness noted PSYCHIATRIC: The patient is alert and oriented x 3. Poor judgment  SKIN: No obvious rash, lesion, or ulcer.    LABORATORY PANEL:   CBC  Recent Labs Lab 04/15/17 0720  WBC 5.5  HGB 10.4*  HCT 32.0*  PLT 244   ------------------------------------------------------------------------------------------------------------------  Chemistries   Recent Labs Lab 04/17/17 0604  NA 140  K 5.0  CL 116*  CO2 18*  GLUCOSE 74  BUN <5*  CREATININE 0.58  CALCIUM 8.6*  MG 1.4*   ------------------------------------------------------------------------------------------------------------------  Cardiac Enzymes  Recent Labs Lab 04/14/17 1056  TROPONINI <0.03   ------------------------------------------------------------------------------------------------------------------  RADIOLOGY:  No results found.  EKG:   Orders placed or performed during the hospital encounter of 04/09/17  . ED EKG  . ED EKG  . EKG 12-Lead  . EKG 12-Lead    ASSESSMENT AND PLAN:   56 year old female with past medical history significant for COPD, hypertension, hypothyroidism, and no carcinoma of lung with recurrence and cavitary lesion now presents to the hospital secondary to shortness of breath and noted to be hypokalemic.  #1 electrolyte abnormalities-hypokalemia and hypomagnesemia-due to poor nutrition. -replaced aggressively. Appreciate pharmacy consult. Much improved electrolytes -Patient is high risk for refeeding syndrome. Monitor electrolytes.  #2 severe malnourishment-poor by mouth intake.  Also says sometimes she gets depressed and doesn't eat food. -Dietitian consult. On Megace -Improved nutrition while in the hospital  #3 shortness of  breath-triggered by anxiety episodes. Currently comfortable on room air. Has poorly differentiated non-small cell lung cancer with cavitary lesion. No change on chest x-ray  #4 history of poorly differentiated non-small cell lung cancer-  patient has not been getting any chemotherapy due to generalized weakness and poor health. -Oncology consulted. Patient is just discharged from hospice due to her unsafe living conditions. -No recent visits to [facility] as well. -Palliative care consulted. Might need to healthcare guardian and CODE STATUS changes. -Patient deemed capable to make her own medical conditions according to psych. She does not want hospice at this time and wants to remain full code.  #5 hyperthyroidism-continue methimazole  #6 hypertension-discontinue metoprolol as blood pressure is on low normal side.  #7 GERD-Protonix.  #8 DVT prophylaxis-on Lovenox  Physical therapy consulted. Recommended rehabilitation. Patient has adamantly refused rehabilitation in the past. She was seen ambulating to the bathroom with 1 person assist today. -Hospice signed off. And patient does not want hospice at this time. On palliative care unable to reach family. Patient wants to be full code. -Psych has deemed that patient is capable to make her own medical decisions. -Plan to discharge her today however patient said that she wants to go to rehabilitation at this time. Social worker consulted     All the records are reviewed and case discussed with Care Management/Social Workerr. Management plans discussed with the patient, family and they are in agreement.  CODE STATUS: Full code  TOTAL TIME TAKING CARE OF THIS PATIENT: 37 minutes.   POSSIBLE D/C IN 1-2 DAYS, DEPENDING ON CLINICAL CONDITION.   Gladstone Lighter M.D on 04/17/2017 at 2:15  PM  Between 7am to 6pm - Pager - [phone]  After 6pm go to [email or web address] - password [place] [facility]  Office  [phone]  CC: Primary care physician; Donnie Coffin, MD
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
## 2017-04-18 LAB — BASIC METABOLIC PANEL
Anion gap: 4 — ABNORMAL LOW (ref 5–15)
BUN: <5 mg/dL — ABNORMAL LOW (ref 6–20)
CALCIUM: 8.9 mg/dL (ref 8.9–10.3)
CHLORIDE: 108 mmol/L (ref 101–111)
CO2: 24 mmol/L (ref 22–32)
Creatinine, Ser: 0.49 mg/dL (ref 0.44–1.00)
GFR calc Af Amer: >60 mL/min (ref >60)
GLUCOSE: 95 mg/dL (ref 65–99)
POTASSIUM: 3.9 mmol/L (ref 3.5–5.1)
Sodium: 136 mmol/L (ref 135–145)

## 2017-04-18 LAB — MAGNESIUM: Magnesium: 1.4 mg/dL — ABNORMAL LOW (ref 1.7–2.4)

## 2017-04-18 LAB — PHOSPHORUS: Phosphorus: 2.4 mg/dL — ABNORMAL LOW (ref 2.5–4.6)

## 2017-04-18 MED ORDER — ALPRAZOLAM 0.25 MG PO TABS: 0.2500 mg | ORAL_TABLET | Freq: Three times a day (TID) | ORAL | 0 refills | Status: DC | PRN

## 2017-04-18 MED ORDER — MAGNESIUM SULFATE 4 GM/100ML IV SOLN
4.0000 g | Freq: Once | INTRAVENOUS | Status: AC
  Administered 2017-04-18: 10:00:00 4 g via INTRAVENOUS
  Filled 2017-04-18: qty 100

## 2017-04-18 MED ORDER — MORPHINE SULFATE (CONCENTRATE) 10 MG /0.5 ML PO SOLN: 10.0000 mg | ORAL | 0 refills | Status: DC | PRN

## 2017-04-18 NOTE — Progress Notes (Signed)
[facility] for electrolyte monitoring Indication: hypokalemia, hypomagnesemia   Allergies  Allergen Reactions  . No Known Allergies     Patient Measurements: Height: '5\' 1"'$  (154.9 cm) Weight: 113 lb 3.2 oz (51.3 kg) IBW/kg (Calculated) : 47.8  Vital Signs: Temp: 98.5 F (36.9 C) (04/28 0353) Temp Source: Oral (04/28 0353) BP: 128/90 (04/28 0353) Pulse Rate: 106 (04/28 0353) Intake/Output from previous day: 04/27 0701 - 04/28 0700 In: 1040 [P.O.:1040] Out: 550 [Urine:550] Intake/Output from this shift: No intake/output data recorded.  Labs: BMP Latest Ref Rng & Units 04/18/2017 04/17/2017 04/16/2017  Glucose 65 - 99 mg/dL 95 74 63(L)  BUN 6 - 20 mg/dL <5(L) <5(L) <5(L)  Creatinine 0.44 - 1.00 mg/dL 0.49 0.58 0.49  Sodium 135 - 145 mmol/L 136 140 138  Potassium 3.5 - 5.1 mmol/L 3.9 5.0 4.0  Chloride 101 - 111 mmol/L 108 116(H) 114(H)  CO2 22 - 32 mmol/L 24 18(L) 23  Calcium 8.9 - 10.3 mg/dL 8.9 8.6(L) 8.7(L)     Estimated Creatinine Clearance: 60.7 mL/min (by C-G formula based on SCr of 0.49 mg/dL).   Microbiology: No results found for this or any previous visit (from the past 720 hour(s)).  Medical History: Past Medical History:  Diagnosis Date  . COPD (chronic obstructive pulmonary disease) ([place])   . Dementia   . Hypertension   . Lung cancer ([place])   . Thyroid disease     Assessment: Pharmacy consulted to monitor and dose electrolytes in this 11 yoF with metastatic non-small cell lung cancer presenting with SOB, weakness and n/v over the past week. Pt currently receiving chemotherapy for lung cancer.  Mg: 1.4, K: 3.9, Phos: 2.4  Goal of Therapy:  Electrolytes WNL  Plan:  Will give Magnesium 4gm IV x 1, patient planning for discharge today. No additional orders at this time.  Rexene Edison, PharmD, BCPS Clinical Pharmacist  04/18/2017 9:19 AM

## 2017-04-18 NOTE — Care Management Note (Signed)
Case Management Note  Patient Details  Name: Bellamarie Pflug MRN: [id number] Date of Birth: May 26, 1961  Subjective/Objective:      Referral for HH-PT, RN, Aide, SWer called to [place] at [facility]. Ms Batdorf already has all DME equipment at home.              Action/Plan:   Expected Discharge Date:  04/18/17               Expected Discharge Plan:     In-House Referral:     Discharge planning Services     Post Acute Care Choice:    Choice offered to:     DME Arranged:    DME Agency:     HH Arranged:    HH Agency:     Status of Service:     If discussed at [employer], dates discussed:    Additional Comments:  Triston Lisanti A, RN 04/18/2017, 10:11 AM

## 2017-04-18 NOTE — Progress Notes (Signed)
Observed pt packing food/drinks in pt belongings bag. Alert, but forgetful Ask repeated questions with same information given. Pt after deciding to go to SNF, she then decided she would go home with dgt. MD notified with order modified to discharge home with family care. Oral and written AVS instructions given to family and pt. Family member verbalized understanding; pt unable to verbalize correctly and ask repeatedly about her prescriptions. Prescriptions for xanax and morphine given to family member.  Pt ready for discharge home with family care.

## 2017-04-18 NOTE — Discharge Summary (Signed)
[facility] NAME: Audrey Mcgee    MR#:  [id number]  DATE OF BIRTH:  06-21-1961  DATE OF ADMISSION:  04/14/2017 ADMITTING PHYSICIAN: Bettey [facility], MD  DATE OF DISCHARGE: 04/18/2017  PRIMARY CARE PHYSICIAN: Donnie Coffin, MD    ADMISSION DIAGNOSIS:  Hypokalemia [E87.6] SOB (shortness of breath) [R06.02] Fatigue, unspecified type [R53.83]  DISCHARGE DIAGNOSIS:  Principal Problem:   Adjustment disorder with mixed anxiety and depressed mood Active Problems:   Hypokalemia   SOB (shortness of breath)   Protein-calorie malnutrition ([place])   SECONDARY DIAGNOSIS:   Past Medical History:  Diagnosis Date  . COPD (chronic obstructive pulmonary disease) ([place])   . Dementia   . Hypertension   . Lung cancer ([place])   . Thyroid disease     HOSPITAL COURSE:   56 year old female with past medical history significant for COPD, hypertension, hypothyroidism, and no carcinoma of lung with recurrence and cavitary lesion now presents to the hospital secondary to shortness of breath and noted to be hypokalemic.  1. Hypokalemia and hypomagnesemia due to poor nutrition These were repleted  2. Adult failure to thrive with severe protein calorie malnutrition: Patient will continue Megace  3. History of poorly differentiated non-small cell lung cancer with shortness of breath: Patient cannot receive chemotherapy due to generalized weakness and overall poor health. Patient was evaluated by oncology while in the hospital. Patient will have referral to outpatient palliative care at discharge. Patient has been dismissed from hospice at this time. Psychiatry saw the patient while in the hospital for medical decision capacity. They state that she is able to make medical decisions.  4. Hyperthyroidism: Continue methimazole  5. Essential hypertension: Blood pressure was low and therefore metoprolol has been discontinued  DISCHARGE CONDITIONS AND DIET:    Stable for discharge on regular diet  CONSULTS OBTAINED:  Treatment Team:  Lequita Asal, MD Gonzella Lex, MD  DRUG ALLERGIES:   Allergies  Allergen Reactions  . No Known Allergies     DISCHARGE MEDICATIONS:   Current Discharge Medication List    CONTINUE these medications which have CHANGED   Details  ALPRAZolam (XANAX) 0.25 MG tablet Take 1 tablet (0.25 mg total) by mouth 3 (three) times daily as needed for anxiety. Qty: 20 tablet, Refills: 0    Morphine Sulfate (MORPHINE CONCENTRATE) 10 mg / 0.5 ml concentrated solution Take 0.5 mLs (10 mg total) by mouth every 4 (four) hours as needed for severe pain. Qty: 30 mL, Refills: 0      CONTINUE these medications which have NOT CHANGED   Details  ipratropium-albuterol (DUONEB) 0.5-2.5 (3) MG/3ML SOLN Take 3 mLs by nebulization every 4 (four) hours. Qty: 360 mL, Refills: 5    levothyroxine (SYNTHROID, LEVOTHROID) 100 MCG tablet Take 100 mcg by mouth daily before breakfast.  Refills: 0    magnesium oxide (MAG-OX) 400 MG tablet Take 1 tablet (400 mg total) by mouth 2 (two) times daily. Qty: 30 tablet, Refills: 0    methimazole (TAPAZOLE) 5 MG tablet Take 1 tablet (5 mg total) by mouth daily. Qty: 30 tablet, Refills: 0    metoprolol (LOPRESSOR) 50 MG tablet Take 0.5 tablets (25 mg total) by mouth 2 (two) times daily. Qty: 30 tablet, Refills: 0    omeprazole (PRILOSEC) 20 MG capsule Take 1 capsule (20 mg total) by mouth daily. Qty: 30 capsule, Refills: 3    ondansetron (ZOFRAN) 4 MG tablet Take 1 tablet (4 mg total) by  mouth every 6 (six) hours as needed for nausea. Qty: 20 tablet, Refills: 0    potassium chloride 20 MEQ TBCR Take 1 tab twice a day Qty: 60 tablet, Refills: 0    traZODone (DESYREL) 50 MG tablet Take 1 tablet (50 mg total) by mouth at bedtime as needed for sleep. Qty: 30 tablet, Refills: 1    docusate sodium (COLACE) 100 MG capsule Take 100 mg by mouth 2 (two) times daily as needed for mild  constipation.    feeding supplement (BOOST / RESOURCE BREEZE) LIQD Take 1 Container by mouth 2 (two) times daily between meals. Qty: 90 Container, Refills: 6    folic acid (FOLVITE) 503 MCG tablet Take 800 mcg by mouth daily.     lidocaine (XYLOCAINE) 2 % solution Use as directed 10 mLs in the mouth or throat every 4 (four) hours as needed for mouth pain.    megestrol (MEGACE) 40 MG/ML suspension Take 10 mLs (400 mg total) by mouth 2 (two) times daily. Qty: 300 mL, Refills: 1    thiamine (VITAMIN B-1) 100 MG tablet Take 1 tablet (100 mg total) by mouth daily. Qty: 30 tablet, Refills: 1    tiotropium (SPIRIVA) 18 MCG inhalation capsule Place 1 capsule (18 mcg total) into inhaler and inhale daily. Qty: 30 capsule, Refills: 1      STOP taking these medications     senna (SENOKOT) 8.6 MG TABS tablet           Today   CHIEF COMPLAINT:   No acute events overnight   VITAL SIGNS:  Blood pressure 128/90, pulse (!) 106, temperature 98.5 F (36.9 C), temperature source Oral, resp. rate 20, height '5\' 1"'$  (1.549 m), weight 51.3 kg (113 lb 3.2 oz), last menstrual period 01/30/2008, SpO2 100 %.   REVIEW OF SYSTEMS:  Review of Systems  Constitutional: Negative for chills, fever and malaise/fatigue.  HENT: Negative.  Negative for ear discharge, ear pain, hearing loss, nosebleeds and sore throat.   Eyes: Negative.  Negative for blurred vision and pain.  Respiratory: Negative.  Negative for cough, hemoptysis, shortness of breath and wheezing.   Cardiovascular: Negative.  Negative for chest pain, palpitations and leg swelling.  Gastrointestinal: Negative.  Negative for abdominal pain, blood in stool, diarrhea, nausea and vomiting.  Genitourinary: Negative.  Negative for dysuria.  Musculoskeletal: Negative.  Negative for back pain.  Skin: Negative.   Neurological: Positive for weakness. Negative for dizziness, tremors, speech change, focal weakness, seizures and headaches.   Endo/Heme/Allergies: Negative.  Does not bruise/bleed easily.  Psychiatric/Behavioral: Negative.  Negative for depression, hallucinations and suicidal ideas.     PHYSICAL EXAMINATION:  GENERAL:  56 y.o.-year-old patient lying in the bed with no acute distress. Frail thin ill appearing NECK:  Supple, no jugular venous distention. No thyroid enlargement, no tenderness.  LUNGS: Normal breath sounds bilaterally, no wheezing, rales,rhonchi  No use of accessory muscles of respiration.  CARDIOVASCULAR: S1, S2 normal. No murmurs, rubs, or gallops.  ABDOMEN: Soft, non-tender, non-distended. Bowel sounds present. No organomegaly or mass.  EXTREMITIES: No pedal edema, cyanosis, or clubbing.  PSYCHIATRIC: The patient is alert and oriented x 3.  SKIN: No obvious rash, lesion, or ulcer.   DATA REVIEW:   CBC  Recent Labs Lab 04/15/17 0720  WBC 5.5  HGB 10.4*  HCT 32.0*  PLT 244    Chemistries   Recent Labs Lab 04/18/17 0419  NA 136  K 3.9  CL 108  CO2 24  GLUCOSE 95  BUN <5*  CREATININE 0.49  CALCIUM 8.9  MG 1.4*    Cardiac Enzymes  Recent Labs Lab 04/14/17 1056  TROPONINI <0.03    Microbiology Results  '@[id number]'$ @  RADIOLOGY:  No results found.    Current Discharge Medication List    CONTINUE these medications which have CHANGED   Details  ALPRAZolam (XANAX) 0.25 MG tablet Take 1 tablet (0.25 mg total) by mouth 3 (three) times daily as needed for anxiety. Qty: 20 tablet, Refills: 0    Morphine Sulfate (MORPHINE CONCENTRATE) 10 mg / 0.5 ml concentrated solution Take 0.5 mLs (10 mg total) by mouth every 4 (four) hours as needed for severe pain. Qty: 30 mL, Refills: 0      CONTINUE these medications which have NOT CHANGED   Details  ipratropium-albuterol (DUONEB) 0.5-2.5 (3) MG/3ML SOLN Take 3 mLs by nebulization every 4 (four) hours. Qty: 360 mL, Refills: 5    levothyroxine (SYNTHROID, LEVOTHROID) 100 MCG tablet Take 100 mcg by mouth daily before  breakfast.  Refills: 0    magnesium oxide (MAG-OX) 400 MG tablet Take 1 tablet (400 mg total) by mouth 2 (two) times daily. Qty: 30 tablet, Refills: 0    methimazole (TAPAZOLE) 5 MG tablet Take 1 tablet (5 mg total) by mouth daily. Qty: 30 tablet, Refills: 0    metoprolol (LOPRESSOR) 50 MG tablet Take 0.5 tablets (25 mg total) by mouth 2 (two) times daily. Qty: 30 tablet, Refills: 0    omeprazole (PRILOSEC) 20 MG capsule Take 1 capsule (20 mg total) by mouth daily. Qty: 30 capsule, Refills: 3    ondansetron (ZOFRAN) 4 MG tablet Take 1 tablet (4 mg total) by mouth every 6 (six) hours as needed for nausea. Qty: 20 tablet, Refills: 0    potassium chloride 20 MEQ TBCR Take 1 tab twice a day Qty: 60 tablet, Refills: 0    traZODone (DESYREL) 50 MG tablet Take 1 tablet (50 mg total) by mouth at bedtime as needed for sleep. Qty: 30 tablet, Refills: 1    docusate sodium (COLACE) 100 MG capsule Take 100 mg by mouth 2 (two) times daily as needed for mild constipation.    feeding supplement (BOOST / RESOURCE BREEZE) LIQD Take 1 Container by mouth 2 (two) times daily between meals. Qty: 90 Container, Refills: 6    folic acid (FOLVITE) 314 MCG tablet Take 800 mcg by mouth daily.     lidocaine (XYLOCAINE) 2 % solution Use as directed 10 mLs in the mouth or throat every 4 (four) hours as needed for mouth pain.    megestrol (MEGACE) 40 MG/ML suspension Take 10 mLs (400 mg total) by mouth 2 (two) times daily. Qty: 300 mL, Refills: 1    thiamine (VITAMIN B-1) 100 MG tablet Take 1 tablet (100 mg total) by mouth daily. Qty: 30 tablet, Refills: 1    tiotropium (SPIRIVA) 18 MCG inhalation capsule Place 1 capsule (18 mcg total) into inhaler and inhale daily. Qty: 30 capsule, Refills: 1      STOP taking these medications     senna (SENOKOT) 8.6 MG TABS tablet            Management plans discussed with the patient and she is in agreement. Stable for discharge   Patient should follow up  with dr Alvia Grove  CODE STATUS:     Code Status Orders        Start     Ordered   04/14/17 1455  Full code  Continuous  04/14/17 1454    Code Status History    Date Active Date Inactive Code Status Order ID Comments User Context   03/13/2017  2:15 AM 03/18/2017  6:52 PM Full Code [id number]  Lance Coon, MD Inpatient   03/02/2017 11:01 PM 03/04/2017  3:20 PM Full Code [id number]  Lance Coon, MD Inpatient   02/07/2017 10:26 AM 02/21/2017  8:14 PM Full Code [id number]  Saundra Shelling, MD Inpatient   11/07/2016  6:18 AM 11/11/2016  2:39 PM Full Code [id number]  Harrie Foreman, MD Inpatient   06/21/2016 11:52 AM 06/22/2016  3:07 PM Full Code [id number]  [name], MD Inpatient      TOTAL TIME TAKING CARE OF THIS PATIENT: 37 minutes.    Note: This dictation was prepared with Dragon dictation along with smaller phrase technology. Any transcriptional errors that result from this process are unintentional.  Dillon Mcreynolds M.D on 04/18/2017 at 7:29 AM  Between 7am to 6pm - Pager - [phone] After 6pm go to [email or web address] - password [email or web address]  Office  [phone]  CC: Primary care physician; Donnie Coffin, MD

## 2017-04-18 NOTE — Clinical Social Work Note (Signed)
CSW spoke with patient about dc planning for today. The patient has declined SNF placement and requested HHPT. RNCM is aware. CSW will con't to follow in case patient changes her mind.   Santiago Bumpers, MSW, Latanya Presser [phone]

## 2017-04-18 NOTE — Progress Notes (Signed)
Pt would not eat lunch; she packed it in bags to take home with her. Boyfriend assisted pt with packing. Transported pt in transport chair to private vehicle accompanied by boyfriend with discharge home with home services/family care.

## 2017-04-20 ENCOUNTER — Observation Stay
Admission: EM | Admit: 2017-04-20 | Discharge: 2017-04-21 | Disposition: A | Discharge status: Home / Self Care | Payer: Medicaid Other | Attending: Internal Medicine | Admitting: Internal Medicine

## 2017-04-20 ENCOUNTER — Emergency Department: Payer: Medicaid Other

## 2017-04-20 DIAGNOSIS — R627 Adult failure to thrive: Secondary | ICD-10-CM | POA: Insufficient documentation

## 2017-04-20 DIAGNOSIS — J441 Chronic obstructive pulmonary disease with (acute) exacerbation: Secondary | ICD-10-CM | POA: Insufficient documentation

## 2017-04-20 DIAGNOSIS — I10 Essential (primary) hypertension: Secondary | ICD-10-CM | POA: Diagnosis not present

## 2017-04-20 DIAGNOSIS — C3490 Malignant neoplasm of unspecified part of unspecified bronchus or lung: Secondary | ICD-10-CM | POA: Diagnosis not present

## 2017-04-20 DIAGNOSIS — R0602 Shortness of breath: Secondary | ICD-10-CM | POA: Diagnosis not present

## 2017-04-20 DIAGNOSIS — Z6821 Body mass index (BMI) 21.0-21.9, adult: Secondary | ICD-10-CM | POA: Diagnosis not present

## 2017-04-20 DIAGNOSIS — E079 Disorder of thyroid, unspecified: Secondary | ICD-10-CM | POA: Insufficient documentation

## 2017-04-20 DIAGNOSIS — F0390 Unspecified dementia without behavioral disturbance: Secondary | ICD-10-CM | POA: Diagnosis not present

## 2017-04-20 DIAGNOSIS — E46 Unspecified protein-calorie malnutrition: Secondary | ICD-10-CM | POA: Diagnosis not present

## 2017-04-20 DIAGNOSIS — Z79899 Other long term (current) drug therapy: Secondary | ICD-10-CM | POA: Insufficient documentation

## 2017-04-20 DIAGNOSIS — E860 Dehydration: Secondary | ICD-10-CM | POA: Diagnosis not present

## 2017-04-20 DIAGNOSIS — F17210 Nicotine dependence, cigarettes, uncomplicated: Secondary | ICD-10-CM | POA: Diagnosis not present

## 2017-04-20 DIAGNOSIS — R6251 Failure to thrive (child): Secondary | ICD-10-CM | POA: Diagnosis present

## 2017-04-20 LAB — CBC WITH DIFFERENTIAL/PLATELET
BASOS ABS: 0 10*3/uL (ref 0–0.1)
Basophils Relative: 1 %
EOS ABS: 0.3 10*3/uL (ref 0–0.7)
EOS PCT: 5 %
HCT: 48.3 % — ABNORMAL HIGH (ref 35.0–47.0)
Hemoglobin: 15.6 g/dL (ref 12.0–16.0)
LYMPHS PCT: 39 %
Lymphs Abs: 2.3 10*3/uL (ref 1.0–3.6)
MCH: 27.8 pg (ref 26.0–34.0)
MCHC: 32.2 g/dL (ref 32.0–36.0)
MCV: 86.5 fL (ref 80.0–100.0)
Monocytes Absolute: 0.8 10*3/uL (ref 0.2–0.9)
Monocytes Relative: 13 %
Neutro Abs: 2.6 10*3/uL (ref 1.4–6.5)
Neutrophils Relative %: 42 %
PLATELETS: 306 10*3/uL (ref 150–440)
RBC: 5.59 MIL/uL — ABNORMAL HIGH (ref 3.80–5.20)
RDW: 15.2 % — ABNORMAL HIGH (ref 11.5–14.5)
WBC: 6 10*3/uL (ref 3.6–11.0)

## 2017-04-20 LAB — COMPREHENSIVE METABOLIC PANEL
ALBUMIN: 3.7 g/dL (ref 3.5–5.0)
ALK PHOS: 96 U/L (ref 38–126)
ALT: 10 U/L — ABNORMAL LOW (ref 14–54)
ANION GAP: 13 (ref 5–15)
AST: 30 U/L (ref 15–41)
BUN: 7 mg/dL (ref 6–20)
CO2: 23 mmol/L (ref 22–32)
Calcium: 10.1 mg/dL (ref 8.9–10.3)
Chloride: 100 mmol/L — ABNORMAL LOW (ref 101–111)
Creatinine, Ser: 0.75 mg/dL (ref 0.44–1.00)
GFR calc non Af Amer: >60 mL/min (ref >60)
Glucose, Bld: 66 mg/dL (ref 65–99)
Potassium: 4.4 mmol/L (ref 3.5–5.1)
SODIUM: 136 mmol/L (ref 135–145)
Total Bilirubin: 0.9 mg/dL (ref 0.3–1.2)
Total Protein: 8.5 g/dL — ABNORMAL HIGH (ref 6.5–8.1)

## 2017-04-20 LAB — PROTIME-INR
INR: 1.27
PROTHROMBIN TIME: 16 s — AB (ref 11.4–15.2)

## 2017-04-20 LAB — MAGNESIUM: MAGNESIUM: 1.3 mg/dL — AB (ref 1.7–2.4)

## 2017-04-20 LAB — TROPONIN I

## 2017-04-20 LAB — LIPASE, BLOOD: Lipase: 14 U/L (ref 11–51)

## 2017-04-20 LAB — PHOSPHORUS: PHOSPHORUS: 4.4 mg/dL (ref 2.5–4.6)

## 2017-04-20 LAB — LACTIC ACID, PLASMA: LACTIC ACID, VENOUS: 2.7 mmol/L — AB (ref 0.5–1.9)

## 2017-04-20 MED ORDER — MAGNESIUM SULFATE 2 GM/50ML IV SOLN
2.0000 g | Freq: Once | INTRAVENOUS | Status: AC
  Administered 2017-04-20: 2 g via INTRAVENOUS
  Filled 2017-04-20: qty 50

## 2017-04-20 MED ORDER — MORPHINE SULFATE (PF) 2 MG/ML IV SOLN: 2.0000 mg | Freq: Once | INTRAVENOUS | Status: DC

## 2017-04-20 MED ORDER — SODIUM CHLORIDE 0.9 % IV BOLUS (SEPSIS)
1000.0000 mL | Freq: Once | INTRAVENOUS | Status: AC
  Administered 2017-04-20: 1000 mL via INTRAVENOUS

## 2017-04-20 MED ORDER — LEVOFLOXACIN IN D5W 750 MG/150ML IV SOLN
750.0000 mg | Freq: Once | INTRAVENOUS | Status: AC
  Administered 2017-04-21: 750 mg via INTRAVENOUS
  Filled 2017-04-20: qty 150

## 2017-04-20 MED ORDER — LEVOFLOXACIN 750 MG PO TABS: 750.0000 mg | ORAL_TABLET | Freq: Once | ORAL | Status: DC

## 2017-04-20 MED ORDER — MORPHINE SULFATE (PF) 2 MG/ML IV SOLN
2.0000 mg | Freq: Once | INTRAVENOUS | Status: AC
  Administered 2017-04-20: 2 mg via INTRAVENOUS
  Filled 2017-04-20: qty 1

## 2017-04-20 NOTE — ED Provider Notes (Signed)
[facility] Emergency Department Provider Note  ____________________________________________   First MD Initiated Contact with Patient 04/20/17 2157     (approximate)  I have reviewed the triage vital signs and the nursing notes.   HISTORY  Chief Complaint Shortness of Breath    HPI Audrey Mcgee is a 56 y.o. female who comes to the emergency department with acute shortness of breath that began about an hour or 2 prior to arrival. She has a complex past medical history including terminal stage IV lung cancer for which she is not currently receiving treatment. She was recently discharged from her hospital 2 days ago for dehydration and metabolic derangements as well as shortness of breath. At that time she was evaluated by psychiatry who felt she had capacity, I oncology who encouraged a social work consult, and social work who helped arrange home health. The patient was not amenable to an SNF placement at that time, however she says now that she would be. She has intermittently been living in [place] and been staying with her daughter. She says that she is no longer comfortable staying with her daughter because she does not feel like she is getting the care she needs. Illness. She does report some cough that is nonproductive.   Past Medical History:  Diagnosis Date  . COPD (chronic obstructive pulmonary disease) ([place])   . Dementia   . Hypertension   . Lung cancer ([place])   . Thyroid disease     Patient Active Problem List   Diagnosis Date Noted  . Adjustment disorder with mixed anxiety and depressed mood 04/16/2017  . SOB (shortness of breath)   . Protein-calorie malnutrition ([place])   . HCAP (healthcare-associated pneumonia) 03/12/2017  . Hyperthyroidism 03/02/2017  . COPD (chronic obstructive pulmonary disease) ([place]) 03/02/2017  . Subacute delirium 02/18/2017  . Altered mental status 02/12/2017  . Cannabis abuse 02/12/2017  . Nausea 02/12/2017   . Failure to thrive in adult 02/12/2017  . Hypokalemia 02/12/2017  . Fatigue 02/12/2017  . Lung cancer ([place]) 02/12/2017  . Hypothyroidism 02/11/2017  . Malignancy ([place])   . Palliative care by specialist   . Goals of care, counseling/discussion   . DNR (do not resuscitate) discussion   . Depressive disorder   . Dehydration 02/07/2017  . Weight loss 02/07/2017  . Encounter for antineoplastic chemotherapy 02/01/2017  . 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 Sepsis ([place]) 11/07/2016  . Protein-calorie malnutrition, severe 11/07/2016  . Acute respiratory distress 06/21/2016  . COPD exacerbation ([place]) 06/21/2016  . Sinus tachycardia 06/21/2016  . Recurrent aspiration bronchitis/pneumonia ([place]) 06/21/2016  . Dysphagia 06/21/2016  . Nausea and vomiting 06/21/2016  . Dental abscess 05/10/2016  . Swallowing difficulty 03/28/2016  . Hypomagnesemia 02/29/2016  . Hoarseness 02/18/2016  . Cancer related pain 07/08/2015  . Adenocarcinoma, lung ([place]) 03/26/2015  . 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 Ear ache 10/03/2014  . Abscess of buccal cavity 10/03/2014  . Lump in neck 10/03/2014  . Laryngeal pain 10/03/2014    Past Surgical History:  Procedure Laterality Date  . CESAREAN SECTION CLASSICAL    . STOMACH SURGERY     Pt reports for a tumor    Prior to Admission medications   Medication Sig Start Date End Date Taking? Authorizing Provider  ALPRAZolam (XANAX) 0.25 MG tablet Take 1 tablet (0.25 mg total) by mouth 3 (three) times daily as needed for anxiety. 04/18/17   Bettey Costa, MD  docusate sodium (COLACE) 100  MG capsule Take 100 mg by mouth 2 (two) times daily as needed for mild constipation.    Historical Provider, MD  feeding supplement (BOOST / RESOURCE BREEZE) LIQD Take 1 Container by mouth 2 (two) times daily between meals. 02/12/17   [name], MD  folic acid (FOLVITE) 272 MCG tablet Take 800 mcg by mouth daily.     Historical Provider, MD    ipratropium-albuterol (DUONEB) 0.5-2.5 (3) MG/3ML SOLN Take 3 mLs by nebulization every 4 (four) hours. 02/12/17   [name], MD  levothyroxine (SYNTHROID, LEVOTHROID) 100 MCG tablet Take 100 mcg by mouth daily before breakfast.  02/24/17   Historical Provider, MD  lidocaine (XYLOCAINE) 2 % solution Use as directed 10 mLs in the mouth or throat every 4 (four) hours as needed for mouth pain.    Historical Provider, MD  magnesium oxide (MAG-OX) 400 MG tablet Take 1 tablet (400 mg total) by mouth 2 (two) times daily. 03/04/17   [name], MD  megestrol (MEGACE) 40 MG/ML suspension Take 10 mLs (400 mg total) by mouth 2 (two) times daily. Patient not taking: Reported on 04/14/2017 02/02/17   Lequita Asal, MD  methimazole (TAPAZOLE) 5 MG tablet Take 1 tablet (5 mg total) by mouth daily. 03/19/17   [name], MD  metoprolol (LOPRESSOR) 50 MG tablet Take 0.5 tablets (25 mg total) by mouth 2 (two) times daily. 03/04/17   [name], MD  Morphine Sulfate (MORPHINE CONCENTRATE) 10 mg / 0.5 ml concentrated solution Take 0.5 mLs (10 mg total) by mouth every 4 (four) hours as needed for severe pain. 04/18/17   Bettey Costa, MD  omeprazole (PRILOSEC) 20 MG capsule Take 1 capsule (20 mg total) by mouth daily. 06/20/16   Lequita Asal, MD  ondansetron (ZOFRAN) 4 MG tablet Take 1 tablet (4 mg total) by mouth every 6 (six) hours as needed for nausea. 02/12/17   [name], MD  potassium chloride 20 MEQ TBCR Take 1 tab twice a day 03/04/17   [name], MD  thiamine (VITAMIN B-1) 100 MG tablet Take 1 tablet (100 mg total) by mouth daily. Patient not taking: Reported on 04/14/2017 02/21/17   [name], MD  tiotropium (SPIRIVA) 18 MCG inhalation capsule Place 1 capsule (18 mcg total) into inhaler and inhale daily. Patient not taking: Reported on 04/14/2017 06/22/16   [name], MD  traZODone (DESYREL) 50 MG tablet Take 1 tablet (50 mg total) by mouth at bedtime as needed for sleep. 02/21/17    [name], MD    Allergies No known allergies  Family History  Problem Relation Age of Onset  . Hypertension Father   . Cancer Father   . Diabetes Father   . Cancer Maternal Aunt     Social History Social History  Substance Use Topics  . Smoking status: Current Every Day Smoker    Packs/day: 0.25    Years: 15.00    Types: Cigarettes  . Smokeless tobacco: Never Used  . Alcohol use No    Review of Systems Constitutional: No fever/chills Eyes: No visual changes. ENT: No sore throat. Cardiovascular: Positive chest pain. Respiratory: Positive shortness of breath. Gastrointestinal: No abdominal pain.  No nausea, no vomiting.  No diarrhea.  No constipation. Genitourinary: Negative for dysuria. Musculoskeletal: Negative for back pain. Skin: Negative for rash. Neurological: Negative for headaches, focal weakness or numbness.  10-point ROS otherwise negative.  ____________________________________________   PHYSICAL EXAM:  VITAL SIGNS: ED Triage Vitals [04/20/17 2156]  Enc  Vitals Group     BP      Pulse      Resp      Temp      Temp src      SpO2      Weight      Height      Head Circumference      Peak Flow      Pain Score 7     Pain Loc      Pain Edu?      Excl. in [place]?     Constitutional: Cachectic chronically ill-appearing elevated respiratory rate Eyes: PERRL EOMI. Head: Atraumatic. Nose: No congestion/rhinnorhea. Mouth/Throat: No trismus Neck: No stridor.   Cardiovascular: Tachycardic rate, regular rhythm. Grossly normal heart sounds.  Good peripheral circulation. Respiratory: Elevated respiratory rate although lungs are grossly clear Gastrointestinal: Soft nondistended nontender no rebound no guarding no peritonitis Musculoskeletal: No lower extremity edema   Neurologic:  Normal speech and language. No gross focal neurologic deficits are appreciated. Skin:  Skin is warm, dry and intact. No rash noted. Psychiatric: Mood and affect are normal.  Speech and behavior are normal.    ____________________________________________   DIFFERENTIAL  Metabolic arrangement, pneumonia, pulmonary embolism, progressive metastatic disease, failure to thrive, dehydration ____________________________________________   LABS (all labs ordered are listed, but only abnormal results are displayed)  Labs Reviewed  LACTIC ACID, PLASMA - Abnormal; Notable for the following:       Result Value   Lactic Acid, Venous 2.7 (*)    All other components within normal limits  COMPREHENSIVE METABOLIC PANEL - Abnormal; Notable for the following:    Chloride 100 (*)    Total Protein 8.5 (*)    ALT 10 (*)    All other components within normal limits  CBC WITH DIFFERENTIAL/PLATELET - Abnormal; Notable for the following:    RBC 5.59 (*)    HCT 48.3 (*)    RDW 15.2 (*)    All other components within normal limits  PROTIME-INR - Abnormal; Notable for the following:    Prothrombin Time 16.0 (*)    All other components within normal limits  MAGNESIUM - Abnormal; Notable for the following:    Magnesium 1.3 (*)    All other components within normal limits  CULTURE, BLOOD (ROUTINE X 2)  CULTURE, BLOOD (ROUTINE X 2)  URINE CULTURE  LIPASE, BLOOD  TROPONIN I  PHOSPHORUS  LACTIC ACID, PLASMA  PROCALCITONIN  URINALYSIS, ROUTINE W REFLEX MICROSCOPIC  PREALBUMIN    Elevated lactate suggestive under resuscitation, low magnesium suggestive of starvation __________________________________________  EKG  ED ECG REPORT I, Darel Hong, the attending physician, personally viewed and interpreted this ECG.  Date: 04/20/2017 Rate: 140 Rhythm: Sinus tachycardia QRS Axis: normal Intervals: normal ST/T Wave abnormalities: normal Conduction Disturbances: none Narrative Interpretation: Abnormal  ____________________________________________  RADIOLOGY  Chest x-ray abnormal but unchanged from 6 days  ago ____________________________________________   PROCEDURES  Procedure(s) performed: no  Procedures  Critical Care performed: no  Observation: no ____________________________________________   INITIAL IMPRESSION / ASSESSMENT AND PLAN / ED COURSE  Pertinent labs & imaging results that were available during my care of the patient were reviewed by me and considered in my medical decision making (see chart for details).  On arrival the patient is tachycardic into [place] with clear lungs. Her past medical history is complex. I have concern for dehydration, pneumonia, pulmonary embolism, etc. I will give her some IV fluids and check broad labs. Disposition is pending.     -----------------------------------------  11:23 PM on 04/20/2017 -----------------------------------------  The patient is tachycardic, 2, cachectic, ill appearing. She does not have a safe place to stay and is unable to care for herself appropriately. Fortunately her chest x-ray is unchanged from 6 days ago, however she has an elevated lactate, signs of dehydration, and has failed outpatient management. She said that at this time she is amenable to palliative care or think she requires inpatient admission for IV fluids, electrolyte repletion, palliative care consult, and likely SNF placement. She says she is currently amenable to placement. ____________________________________________   FINAL CLINICAL IMPRESSION(S) / ED DIAGNOSES  Final diagnoses:  Dehydration  Shortness of breath  Failure to thrive in adult  Hypomagnesemia      NEW MEDICATIONS STARTED DURING THIS VISIT:  New Prescriptions   No medications on file     Note:  This document was prepared using Dragon voice recognition software and may include unintentional dictation errors.     Darel Hong, MD 04/20/17 2330

## 2017-04-20 NOTE — ED Triage Notes (Signed)
Pt arrives via [facility] from home per EMS. Per EMS pt was taking a nap and woke up SOB. No distress noted. No difficulty breathing noted, no increased work effort noted. Pt tearful.

## 2017-04-20 NOTE — ED Notes (Signed)
Pt c/o of being cold, provided 2 warm blankets

## 2017-04-20 NOTE — ED Notes (Signed)
Date and time results received: 04/20/17 10:45pm   Test: Lactic Acid Critical Value: 2.7  Name of Provider Notified: Dr. Mable Paris

## 2017-04-21 ENCOUNTER — Encounter: Payer: Self-pay | Admitting: Internal Medicine

## 2017-04-21 LAB — CBC
HEMATOCRIT: 39.8 % (ref 35.0–47.0)
HEMOGLOBIN: 13.1 g/dL (ref 12.0–16.0)
MCH: 28.2 pg (ref 26.0–34.0)
MCHC: 32.9 g/dL (ref 32.0–36.0)
MCV: 85.6 fL (ref 80.0–100.0)
Platelets: 300 10*3/uL (ref 150–440)
RBC: 4.66 MIL/uL (ref 3.80–5.20)
RDW: 14.9 % — ABNORMAL HIGH (ref 11.5–14.5)
WBC: 6.7 10*3/uL (ref 3.6–11.0)

## 2017-04-21 LAB — BASIC METABOLIC PANEL
ANION GAP: 11 (ref 5–15)
BUN: 6 mg/dL (ref 6–20)
CHLORIDE: 105 mmol/L (ref 101–111)
CO2: 21 mmol/L — ABNORMAL LOW (ref 22–32)
Calcium: 9.1 mg/dL (ref 8.9–10.3)
Creatinine, Ser: 0.53 mg/dL (ref 0.44–1.00)
Glucose, Bld: 77 mg/dL (ref 65–99)
POTASSIUM: 3.6 mmol/L (ref 3.5–5.1)
SODIUM: 137 mmol/L (ref 135–145)

## 2017-04-21 LAB — LACTIC ACID, PLASMA: Lactic Acid, Venous: 1.8 mmol/L (ref 0.5–1.9)

## 2017-04-21 LAB — PROCALCITONIN

## 2017-04-21 LAB — PREALBUMIN: Prealbumin: 10.4 mg/dL — ABNORMAL LOW (ref 18–38)

## 2017-04-21 MED ORDER — ONDANSETRON HCL 4 MG PO TABS: 4.0000 mg | ORAL_TABLET | Freq: Four times a day (QID) | ORAL | Status: DC | PRN

## 2017-04-21 MED ORDER — LEVOTHYROXINE SODIUM 100 MCG PO TABS
100.0000 ug | ORAL_TABLET | Freq: Every day | ORAL | Status: DC
  Administered 2017-04-21: 100 ug via ORAL
  Filled 2017-04-21: qty 1

## 2017-04-21 MED ORDER — FOLIC ACID 800 MCG PO TABS: 800.0000 ug | ORAL_TABLET | Freq: Every day | ORAL | Status: DC

## 2017-04-21 MED ORDER — METOPROLOL TARTRATE 25 MG PO TABS
25.0000 mg | ORAL_TABLET | Freq: Two times a day (BID) | ORAL | Status: DC
  Administered 2017-04-21: 25 mg via ORAL
  Filled 2017-04-21 (×2): qty 1

## 2017-04-21 MED ORDER — ENOXAPARIN SODIUM 40 MG/0.4ML ~~LOC~~ SOLN: 40.0000 mg | SUBCUTANEOUS | Status: DC

## 2017-04-21 MED ORDER — SENNOSIDES-DOCUSATE SODIUM 8.6-50 MG PO TABS: 1.0000 | ORAL_TABLET | Freq: Every evening | ORAL | Status: DC | PRN

## 2017-04-21 MED ORDER — TRAZODONE HCL 50 MG PO TABS: 50.0000 mg | ORAL_TABLET | Freq: Every evening | ORAL | Status: DC | PRN

## 2017-04-21 MED ORDER — LIDOCAINE VISCOUS 2 % MT SOLN
10.0000 mL | OROMUCOSAL | Status: DC | PRN
  Filled 2017-04-21: qty 10

## 2017-04-21 MED ORDER — METHIMAZOLE 10 MG PO TABS
5.0000 mg | ORAL_TABLET | Freq: Every day | ORAL | Status: DC
  Administered 2017-04-21: 09:00:00 5 mg via ORAL
  Filled 2017-04-21: qty 1

## 2017-04-21 MED ORDER — MEGESTROL ACETATE 400 MG/10ML PO SUSP
400.0000 mg | Freq: Two times a day (BID) | ORAL | Status: DC
  Administered 2017-04-21: 400 mg via ORAL
  Filled 2017-04-21 (×3): qty 10

## 2017-04-21 MED ORDER — MORPHINE SULFATE (CONCENTRATE) 10 MG/0.5ML PO SOLN
10.0000 mg | ORAL | Status: DC | PRN
  Administered 2017-04-21: 10 mg via ORAL
  Filled 2017-04-21: qty 1

## 2017-04-21 MED ORDER — MAGNESIUM OXIDE 400 (241.3 MG) MG PO TABS
400.0000 mg | ORAL_TABLET | Freq: Two times a day (BID) | ORAL | Status: DC
  Administered 2017-04-21: 400 mg via ORAL
  Filled 2017-04-21 (×2): qty 1

## 2017-04-21 MED ORDER — SODIUM CHLORIDE 0.9 % IV SOLN
INTRAVENOUS | Status: DC
  Administered 2017-04-21: 03:00:00 via INTRAVENOUS

## 2017-04-21 MED ORDER — TIOTROPIUM BROMIDE MONOHYDRATE 18 MCG IN CAPS
18.0000 ug | ORAL_CAPSULE | Freq: Every day | RESPIRATORY_TRACT | Status: DC
  Administered 2017-04-21: 09:00:00 18 ug via RESPIRATORY_TRACT
  Filled 2017-04-21: qty 5

## 2017-04-21 MED ORDER — BOOST / RESOURCE BREEZE PO LIQD
1.0000 | Freq: Two times a day (BID) | ORAL | Status: DC
  Administered 2017-04-21: 1 via ORAL

## 2017-04-21 MED ORDER — FOLIC ACID 1 MG PO TABS
1.0000 mg | ORAL_TABLET | Freq: Every day | ORAL | Status: DC
  Administered 2017-04-21: 1 mg via ORAL
  Filled 2017-04-21: qty 1

## 2017-04-21 MED ORDER — IPRATROPIUM-ALBUTEROL 0.5-2.5 (3) MG/3ML IN SOLN
3.0000 mL | RESPIRATORY_TRACT | Status: DC
  Administered 2017-04-21 (×2): 3 mL via RESPIRATORY_TRACT
  Filled 2017-04-21 (×2): qty 3

## 2017-04-21 MED ORDER — ALPRAZOLAM 0.5 MG PO TABS
0.2500 mg | ORAL_TABLET | Freq: Three times a day (TID) | ORAL | Status: DC | PRN
  Administered 2017-04-21: 11:00:00 0.25 mg via ORAL
  Filled 2017-04-21: qty 1

## 2017-04-21 MED ORDER — SODIUM CHLORIDE 0.9% FLUSH
3.0000 mL | Freq: Two times a day (BID) | INTRAVENOUS | Status: DC
  Administered 2017-04-21: 03:00:00 3 mL via INTRAVENOUS

## 2017-04-21 MED ORDER — ACETAMINOPHEN 650 MG RE SUPP: 650.0000 mg | Freq: Four times a day (QID) | RECTAL | Status: DC | PRN

## 2017-04-21 MED ORDER — PANTOPRAZOLE SODIUM 40 MG PO TBEC
40.0000 mg | DELAYED_RELEASE_TABLET | Freq: Every day | ORAL | Status: DC
  Administered 2017-04-21: 40 mg via ORAL
  Filled 2017-04-21: qty 1

## 2017-04-21 MED ORDER — VITAMIN B-1 100 MG PO TABS
100.0000 mg | ORAL_TABLET | Freq: Every day | ORAL | Status: DC
  Administered 2017-04-21: 09:00:00 100 mg via ORAL
  Filled 2017-04-21: qty 1

## 2017-04-21 MED ORDER — IPRATROPIUM-ALBUTEROL 0.5-2.5 (3) MG/3ML IN SOLN
3.0000 mL | RESPIRATORY_TRACT | Status: DC
  Administered 2017-04-21: 3 mL via RESPIRATORY_TRACT
  Filled 2017-04-21: qty 3

## 2017-04-21 MED ORDER — DOCUSATE SODIUM 100 MG PO CAPS: 100.0000 mg | ORAL_CAPSULE | Freq: Two times a day (BID) | ORAL | Status: DC | PRN

## 2017-04-21 MED ORDER — ACETAMINOPHEN 325 MG PO TABS
650.0000 mg | ORAL_TABLET | Freq: Four times a day (QID) | ORAL | Status: DC | PRN
  Administered 2017-04-21: 650 mg via ORAL
  Filled 2017-04-21: qty 2

## 2017-04-21 NOTE — H&P (Signed)
[facility] [place] NAME: Audrey Mcgee    MR#:  [id number]  DATE OF BIRTH:  03/31/1961  DATE OF ADMISSION:  04/20/2017  PRIMARY CARE PHYSICIAN: Donnie Coffin, MD   REQUESTING/REFERRING PHYSICIAN:   CHIEF COMPLAINT:   Chief Complaint  Patient presents with  . Shortness of Breath    HISTORY OF PRESENT ILLNESS: Audrey Mcgee  is a 56 y.o. female with a known history of Lung cancer, COPD, hypertension, thyroid disease presented to the emergency room with generalized weakness and an episode of shortness of breath. Patient was recently discharged from our hospital on 04/18/2017. She was treated for shortness of breath protein calorie malnutrition and electrolyte disturbance. She patient does not want home hospice and she was tired and fatigued at home. She pointed back to the emergency room has generalized weakness and poor appetite. She was evaluated in the emergency room lactate level was elevated but WBC count was normal and no new changes on the chest x-ray. Patient appears dry and dehydrated. She was given IV fluid bolus in the emergency room and she felt better and hospitalist service was consulted for further care of the patient. Patient seen and evaluated on 04/20/2017.  PAST MEDICAL HISTORY:   Past Medical History:  Diagnosis Date  . COPD (chronic obstructive pulmonary disease) ([place])   . Dementia   . Hypertension   . Lung cancer ([place])   . Thyroid disease     PAST SURGICAL HISTORY: Past Surgical History:  Procedure Laterality Date  . CESAREAN SECTION CLASSICAL    . STOMACH SURGERY     Pt reports for a tumor    SOCIAL HISTORY:  Social History  Substance Use Topics  . Smoking status: Current Every Day Smoker    Packs/day: 0.25    Years: 15.00    Types: Cigarettes  . Smokeless tobacco: Never Used  . Alcohol use No    FAMILY HISTORY:  Family History  Problem Relation Age of Onset  . Hypertension Father   .  Cancer Father   . Diabetes Father   . Cancer Maternal Aunt   . Cancer Mother     DRUG ALLERGIES:  Allergies  Allergen Reactions  . No Known Allergies     REVIEW OF SYSTEMS:   CONSTITUTIONAL: No fever, has weakness.  EYES: No blurred or double vision.  EARS, NOSE, AND THROAT: No tinnitus or ear pain.  RESPIRATORY: occasional cough, has shortness of breath,  No wheezing or hemoptysis.  CARDIOVASCULAR: No chest pain, orthopnea, edema.  GASTROINTESTINAL: No nausea, vomiting, diarrhea or abdominal pain.  GENITOURINARY: No dysuria, hematuria.  ENDOCRINE: No polyuria, nocturia,  HEMATOLOGY: No anemia, easy bruising or bleeding SKIN: No rash or lesion. MUSCULOSKELETAL: No joint pain or arthritis.   NEUROLOGIC: No tingling, numbness, weakness.  PSYCHIATRY: No anxiety or depression.   MEDICATIONS AT HOME:  Prior to Admission medications   Medication Sig Start Date End Date Taking? Authorizing Provider  ALPRAZolam (XANAX) 0.25 MG tablet Take 1 tablet (0.25 mg total) by mouth 3 (three) times daily as needed for anxiety. 04/18/17  Yes Bettey Costa, MD  docusate sodium (COLACE) 100 MG capsule Take 100 mg by mouth 2 (two) times daily as needed for mild constipation.   Yes Historical Provider, MD  feeding supplement (BOOST / RESOURCE BREEZE) LIQD Take 1 Container by mouth 2 (two) times daily between meals. 02/12/17  Yes [name], MD  folic acid (FOLVITE) 010 MCG tablet Take 800  mcg by mouth daily.    Yes Historical Provider, MD  ipratropium-albuterol (DUONEB) 0.5-2.5 (3) MG/3ML SOLN Take 3 mLs by nebulization every 4 (four) hours. 02/12/17  Yes [name], MD  levothyroxine (SYNTHROID, LEVOTHROID) 100 MCG tablet Take 100 mcg by mouth daily before breakfast.  02/24/17  Yes Historical Provider, MD  lidocaine (XYLOCAINE) 2 % solution Use as directed 10 mLs in the mouth or throat every 4 (four) hours as needed for mouth pain.   Yes Historical Provider, MD  magnesium oxide (MAG-OX) 400 MG tablet  Take 1 tablet (400 mg total) by mouth 2 (two) times daily. 03/04/17  Yes [name], MD  megestrol (MEGACE) 40 MG/ML suspension Take 10 mLs (400 mg total) by mouth 2 (two) times daily. 02/02/17  Yes Lequita Asal, MD  methimazole (TAPAZOLE) 5 MG tablet Take 1 tablet (5 mg total) by mouth daily. 03/19/17  Yes [name], MD  metoprolol (LOPRESSOR) 50 MG tablet Take 0.5 tablets (25 mg total) by mouth 2 (two) times daily. 03/04/17  Yes [name], MD  Morphine Sulfate (MORPHINE CONCENTRATE) 10 mg / 0.5 ml concentrated solution Take 0.5 mLs (10 mg total) by mouth every 4 (four) hours as needed for severe pain. 04/18/17  Yes Bettey Costa, MD  omeprazole (PRILOSEC) 20 MG capsule Take 1 capsule (20 mg total) by mouth daily. 06/20/16  Yes Lequita Asal, MD  ondansetron (ZOFRAN) 4 MG tablet Take 1 tablet (4 mg total) by mouth every 6 (six) hours as needed for nausea. 02/12/17  Yes [name], MD  potassium chloride 20 MEQ TBCR Take 1 tab twice a day 03/04/17  Yes [name], MD  thiamine (VITAMIN B-1) 100 MG tablet Take 1 tablet (100 mg total) by mouth daily. 02/21/17  Yes [name], MD  tiotropium (SPIRIVA) 18 MCG inhalation capsule Place 1 capsule (18 mcg total) into inhaler and inhale daily. 06/22/16  Yes [name], MD  traZODone (DESYREL) 50 MG tablet Take 1 tablet (50 mg total) by mouth at bedtime as needed for sleep. 02/21/17  Yes [name], MD      PHYSICAL EXAMINATION:   VITAL SIGNS: Blood pressure (!) 147/96, pulse (!) 103, temperature 97.8 F (36.6 C), temperature source Oral, resp. rate (!) 22, height '5\' 1"'$  (1.549 m), weight 51.3 kg (113 lb), last menstrual period 01/30/2008, SpO2 100 %.  GENERAL:  56 y.o.-year-old patient lying in the bed with no acute distress.  EYES: Pupils equal, round, reactive to light and accommodation. No scleral icterus. Extraocular muscles intact.  HEENT: Head atraumatic, normocephalic. Oropharynx dry and nasopharynx clear.  NECK:   Supple, no jugular venous distention. No thyroid enlargement, no tenderness.  LUNGS: Normal breath sounds bilaterally, scattered rales in both lungs. No use of accessory muscles of respiration.  CARDIOVASCULAR: S1, S2 normal. No murmurs, rubs, or gallops.  ABDOMEN: Soft, nontender, nondistended. Bowel sounds present. No organomegaly or mass.  EXTREMITIES: No pedal edema, cyanosis, or clubbing.  NEUROLOGIC: Cranial nerves II through XII are intact. Muscle strength 5/5 in all extremities. Sensation intact. Gait not checked.  PSYCHIATRIC: The patient is alert and oriented x 3.  SKIN: No obvious rash, lesion, or ulcer.   LABORATORY PANEL:   CBC  Recent Labs Lab 04/14/17 1056 04/15/17 0720 04/20/17 2157  WBC 5.5 5.5 6.0  HGB 12.1 10.4* 15.6  HCT 36.8 32.0* 48.3*  PLT 262 244 306  MCV 85.8 87.1 86.5  MCH 28.3 28.3 27.8  MCHC 32.9 32.5 32.2  RDW  14.5 14.6* 15.2*  LYMPHSABS 1.2  --  2.3  MONOABS 0.7  --  0.8  EOSABS 0.4  --  0.3  BASOSABS 0.0  --  0.0   ------------------------------------------------------------------------------------------------------------------  Chemistries   Recent Labs Lab 04/15/17 0720 04/16/17 0607 04/17/17 0604 04/18/17 0419 04/20/17 2157  NA 137 138 140 136 136  K 3.2* 4.0 5.0 3.9 4.4  CL 109 114* 116* 108 100*  CO2 21* 23 18* 24 23  GLUCOSE 89 63* 74 95 66  BUN 5* <5* <5* <5* 7  CREATININE 0.71 0.49 0.58 0.49 0.75  CALCIUM 8.6* 8.7* 8.6* 8.9 10.1  MG 1.8 1.5* 1.4* 1.4* 1.3*  AST  --   --   --   --  30  ALT  --   --   --   --  10*  ALKPHOS  --   --   --   --  96  BILITOT  --   --   --   --  0.9   ------------------------------------------------------------------------------------------------------------------ estimated creatinine clearance is 60.7 mL/min (by C-G formula based on SCr of 0.75 mg/dL). ------------------------------------------------------------------------------------------------------------------ No results for input(s):  TSH, T4TOTAL, T3FREE, THYROIDAB in the last 72 hours.  Invalid input(s): FREET3   Coagulation profile  Recent Labs Lab 04/20/17 2157  INR 1.27   ------------------------------------------------------------------------------------------------------------------- No results for input(s): DDIMER in the last 72 hours. -------------------------------------------------------------------------------------------------------------------  Cardiac Enzymes  Recent Labs Lab 04/14/17 1056 04/20/17 2157  TROPONINI <0.03 <0.03   ------------------------------------------------------------------------------------------------------------------ Invalid input(s): POCBNP  ---------------------------------------------------------------------------------------------------------------  Urinalysis    Component Value Date/Time   COLORURINE AMBER (A) 03/13/2017 0034   APPEARANCEUR CLEAR (A) 03/13/2017 0034   APPEARANCEUR Clear 11/10/2013 0918   LABSPEC 1.034 (H) 03/13/2017 0034   LABSPEC 1.018 11/10/2013 0918   PHURINE 6.0 03/13/2017 0034   GLUCOSEU NEGATIVE 03/13/2017 0034   GLUCOSEU Negative 11/10/2013 0918   HGBUR NEGATIVE 03/13/2017 0034   BILIRUBINUR NEGATIVE 03/13/2017 0034   BILIRUBINUR Negative 11/10/2013 0918   KETONESUR NEGATIVE 03/13/2017 0034   PROTEINUR NEGATIVE 03/13/2017 0034   NITRITE NEGATIVE 03/13/2017 0034   LEUKOCYTESUR NEGATIVE 03/13/2017 0034   LEUKOCYTESUR Negative 11/10/2013 0918     RADIOLOGY: Dg Chest Port 1 View  Result Date: 04/20/2017 CLINICAL DATA:  Initial evaluation for acute sepsis. EXAM: PORTABLE CHEST 1 VIEW COMPARISON:  Prior radiograph from 04/14/2017. FINDINGS: Cardiac and mediastinal silhouettes are stable, and remain within normal limits. Elevation left hemidiaphragm, unchanged. Recently seen cavitary lesion involving the left upper lobe is grossly similar to recent radiograph, but improved relative to 3/20 2/18. No other focal infiltrates. No  pulmonary edema or pleural effusion. No pneumothorax. No acute osseus abnormality. IMPRESSION: 1. Grossly similar cavitary left lung lesion as compared to recent radiograph from 04/14/2017, but improved relative to 03/12/17. 2. No other active cardiopulmonary disease. 3. Elevation of the left hemidiaphragm, unchanged. Electronically Signed   By: Jeannine Boga M.D.   On: 04/20/2017 22:23    EKG: Orders placed or performed during the hospital encounter of 04/20/17  . ED EKG 12-Lead  . ED EKG 12-Lead  . EKG 12-Lead  . EKG 12-Lead    IMPRESSION AND PLAN: 56 year old female patient with history of lung cancer, COPD, thyroid disease, hypertension presented to the emergency room with weakness. Admitting diagnosis 1. Dehydration 2. Adult failure to thrive 3. Lung cancer 4. Malnutrition Treatment plan Admit patient to medical floor observation bed IV fluid hydration Nutritional supplements Case management evaluation to assess home situation Resume levothyroxine Supportive care  All  the records are reviewed and case discussed with ED provider. Management plans discussed with the patient, family and they are in agreement.  CODE STATUS:FULL CODE Code Status History    Date Active Date Inactive Code Status Order ID Comments User Context   04/14/2017  2:54 PM 04/18/2017  5:03 PM Full Code [id number]  Bettey Costa, MD Inpatient   03/13/2017  2:15 AM 03/18/2017  6:52 PM Full Code [id number]  Lance Coon, MD Inpatient   03/02/2017 11:01 PM 03/04/2017  3:20 PM Full Code [id number]  Lance Coon, MD Inpatient   02/07/2017 10:26 AM 02/21/2017  8:14 PM Full Code [id number]  Saundra Shelling, MD Inpatient   11/07/2016  6:18 AM 11/11/2016  2:39 PM Full Code [id number]  Harrie Foreman, MD Inpatient   06/21/2016 11:52 AM 06/22/2016  3:07 PM Full Code [id number]  [name], MD Inpatient       TOTAL TIME TAKING CARE OF THIS PATIENT: 50 minutes.    Saundra Shelling M.D on 04/21/2017 at 12:48 AM  Between 7am to  6pm - Pager - [phone]  After 6pm go to [email or web address] - password [email or web address] [facility]  Office  [phone]  CC: Primary care physician; Donnie Coffin, MD

## 2017-04-21 NOTE — Discharge Summary (Signed)
[place] [facility] NAME: Audrey Mcgee    MR#:  [id number]  DATE OF BIRTH:  July 11, 1961  DATE OF ADMISSION:  04/20/2017 ADMITTING PHYSICIAN: Saundra Shelling, MD  DATE OF DISCHARGE: *04/21/17*  PRIMARY CARE PHYSICIAN: Tomasa Hose A, MD    ADMISSION DIAGNOSIS:  Shortness of breath [R06.02] Dehydration [E86.0] Hypomagnesemia [E83.42] Failure to thrive in adult [R62.7]  DISCHARGE DIAGNOSIS:  SOB -sats 100% on RA Failure to thrive Lung cancer  SECONDARY DIAGNOSIS:   Past Medical History:  Diagnosis Date  . COPD (chronic obstructive pulmonary disease) ([place])   . Dementia   . Hypertension   . Lung cancer ([place])   . Thyroid disease     HOSPITAL COURSE:  Audrey Mcgee  is a 56 y.o. female with a known history of Lung cancer, COPD, hypertension, thyroid disease presented to the emergency room with generalized weakness and an episode of shortness of breath. Patient was recently discharged from our hospital on 04/18/2017. She was treated for shortness of breath protein calorie malnutrition and electrolyte disturbance. She patient does not want home hospice and she was tired and fatigued at home.  1. Dehydration clinically -recieved IVF She is eating BF -Electrolytes are fine -BP stable  2. Adult failure to thrive, chronic On po morphine and prn xanax  3. Lung cancer Known history sats 100% on RA Pt has h/o CPD. Recommend cont inhalers  4. Malnutrition Recommend ensure bid to tid  Overall at baseline D/c home CONSULTS OBTAINED:    DRUG ALLERGIES:   Allergies  Allergen Reactions  . No Known Allergies     DISCHARGE MEDICATIONS:   Current Discharge Medication List    CONTINUE these medications which have NOT CHANGED   Details  ALPRAZolam (XANAX) 0.25 MG tablet Take 1 tablet (0.25 mg total) by mouth 3 (three) times daily as needed for anxiety. Qty: 20 tablet, Refills: 0    docusate sodium (COLACE) 100 MG  capsule Take 100 mg by mouth 2 (two) times daily as needed for mild constipation.    feeding supplement (BOOST / RESOURCE BREEZE) LIQD Take 1 Container by mouth 2 (two) times daily between meals. Qty: 90 Container, Refills: 6    folic acid (FOLVITE) 322 MCG tablet Take 800 mcg by mouth daily.     ipratropium-albuterol (DUONEB) 0.5-2.5 (3) MG/3ML SOLN Take 3 mLs by nebulization every 4 (four) hours. Qty: 360 mL, Refills: 5    levothyroxine (SYNTHROID, LEVOTHROID) 100 MCG tablet Take 100 mcg by mouth daily before breakfast.  Refills: 0    lidocaine (XYLOCAINE) 2 % solution Use as directed 10 mLs in the mouth or throat every 4 (four) hours as needed for mouth pain.    magnesium oxide (MAG-OX) 400 MG tablet Take 1 tablet (400 mg total) by mouth 2 (two) times daily. Qty: 30 tablet, Refills: 0    megestrol (MEGACE) 40 MG/ML suspension Take 10 mLs (400 mg total) by mouth 2 (two) times daily. Qty: 300 mL, Refills: 1    methimazole (TAPAZOLE) 5 MG tablet Take 1 tablet (5 mg total) by mouth daily. Qty: 30 tablet, Refills: 0    metoprolol (LOPRESSOR) 50 MG tablet Take 0.5 tablets (25 mg total) by mouth 2 (two) times daily. Qty: 30 tablet, Refills: 0    Morphine Sulfate (MORPHINE CONCENTRATE) 10 mg / 0.5 ml concentrated solution Take 0.5 mLs (10 mg total) by mouth every 4 (four) hours as needed for severe pain. Qty: 30 mL, Refills: 0  omeprazole (PRILOSEC) 20 MG capsule Take 1 capsule (20 mg total) by mouth daily. Qty: 30 capsule, Refills: 3    ondansetron (ZOFRAN) 4 MG tablet Take 1 tablet (4 mg total) by mouth every 6 (six) hours as needed for nausea. Qty: 20 tablet, Refills: 0    potassium chloride 20 MEQ TBCR Take 1 tab twice a day Qty: 60 tablet, Refills: 0    thiamine (VITAMIN B-1) 100 MG tablet Take 1 tablet (100 mg total) by mouth daily. Qty: 30 tablet, Refills: 1    tiotropium (SPIRIVA) 18 MCG inhalation capsule Place 1 capsule (18 mcg total) into inhaler and inhale  daily. Qty: 30 capsule, Refills: 1    traZODone (DESYREL) 50 MG tablet Take 1 tablet (50 mg total) by mouth at bedtime as needed for sleep. Qty: 30 tablet, Refills: 1        If you experience worsening of your admission symptoms, develop shortness of breath, life threatening emergency, suicidal or homicidal thoughts you must seek medical attention immediately by calling 911 or calling your MD immediately  if symptoms less severe.  You Must read complete instructions/literature along with all the possible adverse reactions/side effects for all the Medicines you take and that have been prescribed to you. Take any new Medicines after you have completely understood and accept all the possible adverse reactions/side effects.   Please note  You were cared for by a hospitalist during your hospital stay. If you have any questions about your discharge medications or the care you received while you were in the hospital after you are discharged, you can call the unit and asked to speak with the hospitalist on call if the hospitalist that took care of you is not available. Once you are discharged, your primary care physician will handle any further medical issues. Please note that NO REFILLS for any discharge medications will be authorized once you are discharged, as it is imperative that you return to your primary care physician (or establish a relationship with a primary care physician if you do not have one) for your aftercare needs so that they can reassess your need for medications and monitor your lab values. Today   SUBJECTIVE   weak  VITAL SIGNS:  Blood pressure 118/86, pulse (!) 111, temperature 97.7 F (36.5 C), temperature source Oral, resp. rate 20, height '5\' 1"'$  (1.549 m), weight 46 kg (101 lb 8 oz), last menstrual period 01/30/2008, SpO2 100 %.  I/O:   Intake/Output Summary (Last 24 hours) at 04/21/17 0841 Last data filed at 04/21/17 0325  Gross per 24 hour  Intake             1050 ml   Output                0 ml  Net             1050 ml    PHYSICAL EXAMINATION:  GENERAL:  56 y.o.-year-old patient lying in the bed with no acute distress. Chronically ill EYES: Pupils equal, round, reactive to light and accommodation. No scleral icterus. Extraocular muscles intact.  HEENT: Head atraumatic, normocephalic. Oropharynx and nasopharynx clear. Poor dentition NECK:  Supple, no jugular venous distention. No thyroid enlargement, no tenderness.  LUNGS: Normal breath sounds bilaterally, no wheezing, rales,rhonchi or crepitation. No use of accessory muscles of respiration.  CARDIOVASCULAR: S1, S2 normal. No murmurs, rubs, or gallops.  ABDOMEN: Soft, non-tender, non-distended. Bowel sounds present. No organomegaly or mass.  EXTREMITIES: No pedal edema, cyanosis, or  clubbing.  NEUROLOGIC: Cranial nerves II through XII are intact. Muscle strength 5/5 in all extremities. Sensation intact. Gait not checked. weak PSYCHIATRIC: The patient is alert and oriented x 2.  SKIN: No obvious rash, lesion, or ulcer.   DATA REVIEW:   CBC   Recent Labs Lab 04/21/17 0307  WBC 6.7  HGB 13.1  HCT 39.8  PLT 300    Chemistries   Recent Labs Lab 04/20/17 2157 04/21/17 0307  NA 136 137  K 4.4 3.6  CL 100* 105  CO2 23 21*  GLUCOSE 66 77  BUN 7 6  CREATININE 0.75 0.53  CALCIUM 10.1 9.1  MG 1.3*  --   AST 30  --   ALT 10*  --   ALKPHOS 96  --   BILITOT 0.9  --     Microbiology Results   Recent Results (from the past 240 hour(s))  Blood Culture (routine x 2)     Status: None (Preliminary result)   Collection Time: 04/20/17  9:57 PM  Result Value Ref Range Status   Specimen Description BLOOD LEFT HAND  Final   Special Requests   Final    BOTTLES DRAWN AEROBIC AND ANAEROBIC Blood Culture adequate volume   Culture NO GROWTH < 12 HOURS  Final   Report Status PENDING  Incomplete  Blood Culture (routine x 2)     Status: None (Preliminary result)   Collection Time: 04/20/17 10:00 PM   Result Value Ref Range Status   Specimen Description BLOOD LEFT FOREARM  Final   Special Requests   Final    BOTTLES DRAWN AEROBIC AND ANAEROBIC Blood Culture adequate volume   Culture NO GROWTH < 12 HOURS  Final   Report Status PENDING  Incomplete    RADIOLOGY:  Dg Chest Port 1 View  Result Date: 04/20/2017 CLINICAL DATA:  Initial evaluation for acute sepsis. EXAM: PORTABLE CHEST 1 VIEW COMPARISON:  Prior radiograph from 04/14/2017. FINDINGS: Cardiac and mediastinal silhouettes are stable, and remain within normal limits. Elevation left hemidiaphragm, unchanged. Recently seen cavitary lesion involving the left upper lobe is grossly similar to recent radiograph, but improved relative to 3/20 2/18. No other focal infiltrates. No pulmonary edema or pleural effusion. No pneumothorax. No acute osseus abnormality. IMPRESSION: 1. Grossly similar cavitary left lung lesion as compared to recent radiograph from 04/14/2017, but improved relative to 03/12/17. 2. No other active cardiopulmonary disease. 3. Elevation of the left hemidiaphragm, unchanged. Electronically Signed   By: Jeannine Boga M.D.   On: 04/20/2017 22:23     Management plans discussed with the patient, family and they are in agreement.  CODE STATUS:     Code Status Orders        Start     Ordered   04/21/17 0217  Full code  Continuous     04/21/17 0216    Code Status History    Date Active Date Inactive Code Status Order ID Comments User Context   04/14/2017  2:54 PM 04/18/2017  5:03 PM Full Code [id number]  Bettey Costa, MD Inpatient   03/13/2017  2:15 AM 03/18/2017  6:52 PM Full Code [id number]  Lance Coon, MD Inpatient   03/02/2017 11:01 PM 03/04/2017  3:20 PM Full Code [id number]  Lance Coon, MD Inpatient   02/07/2017 10:26 AM 02/21/2017  8:14 PM Full Code [id number]  Saundra Shelling, MD Inpatient   11/07/2016  6:18 AM 11/11/2016  2:39 PM Full Code [id number]  Harrie Foreman, MD Inpatient   06/21/2016 11:52  AM 06/22/2016  3:07  PM Full Code [id number]  [name], MD Inpatient      TOTAL TIME TAKING CARE OF THIS PATIENT: *40* minutes.    Avamarie Crossley M.D on 04/21/2017 at 8:41 AM  Between 7am to 6pm - Pager - [phone] After 6pm go to [email or web address] - password [email or web address]  Office  [phone]  CC: Primary care physician; Donnie Coffin, MD

## 2017-04-21 NOTE — Progress Notes (Signed)
Patient discharged home per MD order. All discharge instructions given to patient and daughter and all questions answered. Both verbalized understanding of discharge instructions.

## 2017-04-22 ENCOUNTER — Emergency Department
Admission: EM | Admit: 2017-04-22 | Discharge: 2017-04-22 | Disposition: A | Discharge status: Home / Self Care | Payer: Medicaid Other | Attending: Emergency Medicine | Admitting: Emergency Medicine

## 2017-04-22 ENCOUNTER — Emergency Department: Payer: Medicaid Other

## 2017-04-22 ENCOUNTER — Encounter: Payer: Self-pay | Admitting: *Deleted

## 2017-04-22 DIAGNOSIS — C3412 Malignant neoplasm of upper lobe, left bronchus or lung: Secondary | ICD-10-CM | POA: Insufficient documentation

## 2017-04-22 DIAGNOSIS — I10 Essential (primary) hypertension: Secondary | ICD-10-CM | POA: Diagnosis not present

## 2017-04-22 DIAGNOSIS — R0600 Dyspnea, unspecified: Secondary | ICD-10-CM | POA: Diagnosis present

## 2017-04-22 DIAGNOSIS — Z79899 Other long term (current) drug therapy: Secondary | ICD-10-CM | POA: Insufficient documentation

## 2017-04-22 DIAGNOSIS — F17210 Nicotine dependence, cigarettes, uncomplicated: Secondary | ICD-10-CM | POA: Diagnosis not present

## 2017-04-22 DIAGNOSIS — E039 Hypothyroidism, unspecified: Secondary | ICD-10-CM | POA: Insufficient documentation

## 2017-04-22 DIAGNOSIS — J449 Chronic obstructive pulmonary disease, unspecified: Secondary | ICD-10-CM | POA: Diagnosis not present

## 2017-04-22 DIAGNOSIS — R0603 Acute respiratory distress: Secondary | ICD-10-CM

## 2017-04-22 MED ORDER — SODIUM CHLORIDE 0.9 % IV BOLUS (SEPSIS)
1000.0000 mL | Freq: Once | INTRAVENOUS | Status: AC
  Administered 2017-04-22: 1000 mL via INTRAVENOUS

## 2017-04-22 MED ORDER — ALBUTEROL SULFATE (2.5 MG/3ML) 0.083% IN NEBU
2.5000 mg | INHALATION_SOLUTION | Freq: Once | RESPIRATORY_TRACT | Status: AC
  Administered 2017-04-22: 2.5 mg via RESPIRATORY_TRACT
  Filled 2017-04-22: qty 3

## 2017-04-22 NOTE — ED Notes (Signed)
Pt states she has back pain.  Pt released from [facility] this morning.  Pt states sob after people were smoking in the home and pt was also smoking too.  Pt alert , md at bedside.

## 2017-04-22 NOTE — ED Notes (Signed)
Pt alert.  Sinus tach on monitor.  Iv fluids infusing.

## 2017-04-22 NOTE — ED Notes (Signed)
Pt given mouth moisturizer, per request.

## 2017-04-22 NOTE — ED Notes (Signed)
Report off to vanessa rn 

## 2017-04-22 NOTE — ED Notes (Signed)
Iv fluids infusing.   Breathing treatment given to pt.

## 2017-04-22 NOTE — ED Triage Notes (Signed)
Pt brought in via ems with sob.  Pt released from [facility] this am.  Ems report family members in home smoking marijuana.  Pt has lung cancer.   Pt alert.

## 2017-04-22 NOTE — ED Notes (Signed)
Pt verbalized understanding of discharge instructions. NAD at this time. 

## 2017-04-24 ENCOUNTER — Inpatient Hospital Stay: Payer: Medicaid Other | Attending: Hematology and Oncology | Admitting: Hematology and Oncology

## 2017-04-24 ENCOUNTER — Inpatient Hospital Stay: Payer: Medicaid Other

## 2017-04-24 VITALS — BP 128/88 | HR 120 | Temp 97.8°F | Resp 18 | Wt 108.4 lb

## 2017-04-24 DIAGNOSIS — Z9221 Personal history of antineoplastic chemotherapy: Secondary | ICD-10-CM | POA: Insufficient documentation

## 2017-04-24 DIAGNOSIS — C3492 Malignant neoplasm of unspecified part of left bronchus or lung: Secondary | ICD-10-CM | POA: Diagnosis not present

## 2017-04-24 DIAGNOSIS — R1310 Dysphagia, unspecified: Secondary | ICD-10-CM | POA: Diagnosis not present

## 2017-04-24 DIAGNOSIS — C3490 Malignant neoplasm of unspecified part of unspecified bronchus or lung: Secondary | ICD-10-CM

## 2017-04-24 DIAGNOSIS — Z923 Personal history of irradiation: Secondary | ICD-10-CM | POA: Diagnosis not present

## 2017-04-24 DIAGNOSIS — F4322 Adjustment disorder with anxiety: Secondary | ICD-10-CM | POA: Diagnosis not present

## 2017-04-24 DIAGNOSIS — Z79899 Other long term (current) drug therapy: Secondary | ICD-10-CM | POA: Insufficient documentation

## 2017-04-24 DIAGNOSIS — F0390 Unspecified dementia without behavioral disturbance: Secondary | ICD-10-CM | POA: Diagnosis not present

## 2017-04-24 DIAGNOSIS — J449 Chronic obstructive pulmonary disease, unspecified: Secondary | ICD-10-CM | POA: Diagnosis not present

## 2017-04-24 DIAGNOSIS — C7972 Secondary malignant neoplasm of left adrenal gland: Secondary | ICD-10-CM

## 2017-04-24 DIAGNOSIS — E538 Deficiency of other specified B group vitamins: Secondary | ICD-10-CM

## 2017-04-24 DIAGNOSIS — R634 Abnormal weight loss: Secondary | ICD-10-CM

## 2017-04-24 DIAGNOSIS — Z7189 Other specified counseling: Secondary | ICD-10-CM

## 2017-04-24 DIAGNOSIS — F17210 Nicotine dependence, cigarettes, uncomplicated: Secondary | ICD-10-CM | POA: Insufficient documentation

## 2017-04-24 DIAGNOSIS — Z809 Family history of malignant neoplasm, unspecified: Secondary | ICD-10-CM | POA: Insufficient documentation

## 2017-04-24 DIAGNOSIS — R490 Dysphonia: Secondary | ICD-10-CM | POA: Diagnosis not present

## 2017-04-24 DIAGNOSIS — F329 Major depressive disorder, single episode, unspecified: Secondary | ICD-10-CM | POA: Diagnosis not present

## 2017-04-24 DIAGNOSIS — Z8701 Personal history of pneumonia (recurrent): Secondary | ICD-10-CM | POA: Insufficient documentation

## 2017-04-24 DIAGNOSIS — E039 Hypothyroidism, unspecified: Secondary | ICD-10-CM

## 2017-04-24 DIAGNOSIS — E079 Disorder of thyroid, unspecified: Secondary | ICD-10-CM | POA: Diagnosis not present

## 2017-04-24 DIAGNOSIS — I10 Essential (primary) hypertension: Secondary | ICD-10-CM | POA: Insufficient documentation

## 2017-04-24 DIAGNOSIS — J3800 Paralysis of vocal cords and larynx, unspecified: Secondary | ICD-10-CM

## 2017-04-24 DIAGNOSIS — C770 Secondary and unspecified malignant neoplasm of lymph nodes of head, face and neck: Secondary | ICD-10-CM

## 2017-04-24 DIAGNOSIS — G4700 Insomnia, unspecified: Secondary | ICD-10-CM

## 2017-04-24 MED ORDER — MEGESTROL ACETATE 40 MG/ML PO SUSP: 200.0000 mg | Freq: Two times a day (BID) | ORAL | 1 refills | Status: AC

## 2017-04-24 MED ORDER — CYANOCOBALAMIN 1000 MCG/ML IJ SOLN
1000.0000 ug | Freq: Once | INTRAMUSCULAR | Status: AC
  Administered 2017-04-24: 1000 ug via INTRAMUSCULAR
  Filled 2017-04-24: qty 1

## 2017-04-24 MED ORDER — TRAZODONE HCL 50 MG PO TABS
50.0000 mg | ORAL_TABLET | Freq: Every evening | ORAL | 1 refills | Status: AC | PRN
Start: 2017-04-24 — End: ?

## 2017-04-24 NOTE — Progress Notes (Signed)
Patient requesting refill for Trazadone and Megace.  Also needs something for constipation.  She states she does not like the liquid morphine and wants to go back to the Morphine Sulfate 15 mg tablets.  States she is having back pain 8/10.
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
## 2017-04-25 ENCOUNTER — Encounter: Payer: Self-pay | Admitting: Hematology and Oncology

## 2017-04-25 LAB — CULTURE, BLOOD (ROUTINE X 2)
CULTURE: NO GROWTH
Culture: NO GROWTH
Special Requests: ADEQUATE
Special Requests: ADEQUATE

## 2017-04-27 ENCOUNTER — Telehealth: Payer: Self-pay | Admitting: *Deleted

## 2017-04-27 LAB — ACID FAST CULTURE WITH REFLEXED SENSITIVITIES: ACID FAST CULTURE - AFSCU3: NEGATIVE

## 2017-04-27 LAB — ACID FAST CULTURE WITH REFLEXED SENSITIVITIES (MYCOBACTERIA)

## 2017-04-27 NOTE — Telephone Encounter (Signed)
Oneita Kras from [place] [phone]; Option #1) left vm requesting a call back in regards to pts order.

## 2017-04-28 ENCOUNTER — Other Ambulatory Visit: Payer: Self-pay

## 2017-04-28 ENCOUNTER — Emergency Department: Payer: Medicaid Other

## 2017-04-28 ENCOUNTER — Emergency Department
Admission: EM | Admit: 2017-04-28 | Discharge: 2017-04-29 | Disposition: A | Discharge status: Home / Self Care | Payer: Medicaid Other | Source: Home / Self Care | Attending: Emergency Medicine | Admitting: Emergency Medicine

## 2017-04-28 ENCOUNTER — Emergency Department
Admission: EM | Admit: 2017-04-28 | Discharge: 2017-04-28 | Disposition: A | Discharge status: Home / Self Care | Payer: Medicaid Other | Attending: Emergency Medicine | Admitting: Emergency Medicine

## 2017-04-28 DIAGNOSIS — I10 Essential (primary) hypertension: Secondary | ICD-10-CM | POA: Insufficient documentation

## 2017-04-28 DIAGNOSIS — F17210 Nicotine dependence, cigarettes, uncomplicated: Secondary | ICD-10-CM | POA: Diagnosis not present

## 2017-04-28 DIAGNOSIS — Z85118 Personal history of other malignant neoplasm of bronchus and lung: Secondary | ICD-10-CM | POA: Insufficient documentation

## 2017-04-28 DIAGNOSIS — Z79899 Other long term (current) drug therapy: Secondary | ICD-10-CM | POA: Insufficient documentation

## 2017-04-28 DIAGNOSIS — R0602 Shortness of breath: Secondary | ICD-10-CM | POA: Diagnosis present

## 2017-04-28 DIAGNOSIS — J441 Chronic obstructive pulmonary disease with (acute) exacerbation: Secondary | ICD-10-CM | POA: Insufficient documentation

## 2017-04-28 LAB — CBC
HEMATOCRIT: 41.9 % (ref 35.0–47.0)
Hemoglobin: 13.7 g/dL (ref 12.0–16.0)
MCH: 28.4 pg (ref 26.0–34.0)
MCHC: 32.7 g/dL (ref 32.0–36.0)
MCV: 86.8 fL (ref 80.0–100.0)
Platelets: 243 10*3/uL (ref 150–440)
RBC: 4.82 MIL/uL (ref 3.80–5.20)
RDW: 15.3 % — AB (ref 11.5–14.5)
WBC: 6.9 10*3/uL (ref 3.6–11.0)

## 2017-04-28 LAB — COMPREHENSIVE METABOLIC PANEL
ALT: 9 U/L — ABNORMAL LOW (ref 14–54)
AST: 41 U/L (ref 15–41)
Albumin: 3.6 g/dL (ref 3.5–5.0)
Alkaline Phosphatase: 82 U/L (ref 38–126)
Anion gap: 13 (ref 5–15)
BUN: 5 mg/dL — ABNORMAL LOW (ref 6–20)
CHLORIDE: 104 mmol/L (ref 101–111)
CO2: 22 mmol/L (ref 22–32)
Calcium: 9.2 mg/dL (ref 8.9–10.3)
Creatinine, Ser: 0.65 mg/dL (ref 0.44–1.00)
Glucose, Bld: 170 mg/dL — ABNORMAL HIGH (ref 65–99)
POTASSIUM: 4 mmol/L (ref 3.5–5.1)
SODIUM: 139 mmol/L (ref 135–145)
Total Bilirubin: 1.6 mg/dL — ABNORMAL HIGH (ref 0.3–1.2)
Total Protein: 8.1 g/dL (ref 6.5–8.1)

## 2017-04-28 LAB — BASIC METABOLIC PANEL
Anion gap: 10 (ref 5–15)
BUN: 6 mg/dL (ref 6–20)
CALCIUM: 9 mg/dL (ref 8.9–10.3)
CO2: 25 mmol/L (ref 22–32)
CREATININE: 0.66 mg/dL (ref 0.44–1.00)
Chloride: 105 mmol/L (ref 101–111)
GFR calc Af Amer: >60 mL/min (ref >60)
GFR calc non Af Amer: >60 mL/min (ref >60)
GLUCOSE: 77 mg/dL (ref 65–99)
Potassium: 3 mmol/L — ABNORMAL LOW (ref 3.5–5.1)
Sodium: 140 mmol/L (ref 135–145)

## 2017-04-28 LAB — CBC WITH DIFFERENTIAL/PLATELET
Basophils Absolute: 0 10*3/uL (ref 0–0.1)
Basophils Relative: 1 %
Eosinophils Absolute: 0 10*3/uL (ref 0–0.7)
Eosinophils Relative: 0 %
HEMATOCRIT: 39.4 % (ref 35.0–47.0)
HEMOGLOBIN: 13.1 g/dL (ref 12.0–16.0)
LYMPHS ABS: 1.1 10*3/uL (ref 1.0–3.6)
Lymphocytes Relative: 18 %
MCH: 28.1 pg (ref 26.0–34.0)
MCHC: 33.2 g/dL (ref 32.0–36.0)
MCV: 84.6 fL (ref 80.0–100.0)
MONOS PCT: 2 %
Monocytes Absolute: 0.1 10*3/uL — ABNORMAL LOW (ref 0.2–0.9)
NEUTROS ABS: 4.8 10*3/uL (ref 1.4–6.5)
NEUTROS PCT: 79 %
PLATELETS: 275 10*3/uL (ref 150–440)
RBC: 4.65 MIL/uL (ref 3.80–5.20)
RDW: 15.5 % — ABNORMAL HIGH (ref 11.5–14.5)
WBC: 6.1 10*3/uL (ref 3.6–11.0)

## 2017-04-28 LAB — TROPONIN I: Troponin I: <0.03 ng/mL (ref <0.03)

## 2017-04-28 LAB — T4, FREE: Free T4: 1.2 ng/dL — ABNORMAL HIGH (ref 0.61–1.12)

## 2017-04-28 LAB — LACTIC ACID, PLASMA: Lactic Acid, Venous: 1.9 mmol/L (ref 0.5–1.9)

## 2017-04-28 LAB — TSH: TSH: <0.01 u[IU]/mL — ABNORMAL LOW (ref 0.350–4.500)

## 2017-04-28 MED ORDER — METHYLPREDNISOLONE SODIUM SUCC 125 MG IJ SOLR
125.0000 mg | Freq: Once | INTRAMUSCULAR | Status: AC
  Administered 2017-04-28: 125 mg via INTRAVENOUS
  Filled 2017-04-28: qty 2

## 2017-04-28 MED ORDER — ALBUTEROL SULFATE (2.5 MG/3ML) 0.083% IN NEBU
5.0000 mg | INHALATION_SOLUTION | Freq: Once | RESPIRATORY_TRACT | Status: AC
  Administered 2017-04-28: 5 mg via RESPIRATORY_TRACT
  Filled 2017-04-28: qty 6

## 2017-04-28 MED ORDER — PREDNISONE 20 MG PO TABS: 60.0000 mg | ORAL_TABLET | Freq: Every day | ORAL | 0 refills | Status: AC

## 2017-04-28 MED ORDER — DOXYCYCLINE HYCLATE 100 MG PO TABS
100.0000 mg | ORAL_TABLET | Freq: Once | ORAL | Status: AC
  Administered 2017-04-28: 100 mg via ORAL
  Filled 2017-04-28: qty 1

## 2017-04-28 MED ORDER — ONDANSETRON HCL 4 MG/2ML IJ SOLN
4.0000 mg | Freq: Once | INTRAMUSCULAR | Status: AC
  Administered 2017-04-28: 4 mg via INTRAVENOUS
  Filled 2017-04-28: qty 2

## 2017-04-28 MED ORDER — MORPHINE SULFATE (CONCENTRATE) 10 MG/0.5ML PO SOLN
10.0000 mg | Freq: Once | ORAL | Status: AC
  Administered 2017-04-28: 10 mg via ORAL
  Filled 2017-04-28: qty 0.5

## 2017-04-28 MED ORDER — POTASSIUM CHLORIDE CRYS ER 20 MEQ PO TBCR
40.0000 meq | EXTENDED_RELEASE_TABLET | Freq: Once | ORAL | Status: AC
  Administered 2017-04-28: 40 meq via ORAL
  Filled 2017-04-28: qty 2

## 2017-04-28 MED ORDER — DOXYCYCLINE HYCLATE 100 MG PO CAPS: 100.0000 mg | ORAL_CAPSULE | Freq: Two times a day (BID) | ORAL | 0 refills | Status: DC

## 2017-04-28 MED ORDER — IPRATROPIUM-ALBUTEROL 0.5-2.5 (3) MG/3ML IN SOLN
3.0000 mL | Freq: Once | RESPIRATORY_TRACT | Status: AC
  Administered 2017-04-28: 3 mL via RESPIRATORY_TRACT
  Filled 2017-04-28: qty 3

## 2017-04-28 MED ORDER — SODIUM CHLORIDE 0.9 % IV BOLUS (SEPSIS)
1000.0000 mL | Freq: Once | INTRAVENOUS | Status: AC
  Administered 2017-04-28: 1000 mL via INTRAVENOUS

## 2017-04-28 MED ORDER — ACETAMINOPHEN 500 MG PO TABS
ORAL_TABLET | ORAL | Status: AC
  Filled 2017-04-28: qty 2

## 2017-04-28 NOTE — ED Notes (Signed)
Pt taken to lobby via wheelchair to wait on family to pick her up.  First RN notified.

## 2017-04-28 NOTE — ED Provider Notes (Signed)
[facility] Emergency Department Provider Note  ____________________________________________   First MD Initiated Contact with Patient 04/28/17 2342     (approximate)  I have reviewed the triage vital signs and the nursing notes.   HISTORY  Chief Complaint Shortness of Breath    HPI Audrey Mcgee is a 56 y.o. female with extensive chronic medical issues including COPD and metastatic lung cancer who resents by EMS for evaluation of shortness of breath.  She was seen in this emergency department about 12 hours ago and diagnosed with a COPD exacerbation and provided with a couple of prescriptions.  She reports that after leaving the emergency department she later today went to a cookout and after being outside and having some activity at the [place] at she became short of breath again.  She currently is breathing comfortably and using a nebulizer treatment.  She denies chest pain, fever/chills, nausea, vomiting, abdominal pain.  She has been drinking water and is in no distress at this time and is finishing up a nebulizer treatment.  States that the symptoms were severe but now are much better.  Nothing particular made it worse and the breathing treatments make it better.  She states that she does have a nebulizer at home.   Past Medical History:  Diagnosis Date  . COPD (chronic obstructive pulmonary disease) (Audrey Mcgee)   . Dementia   . Hypertension   . Lung cancer (Audrey Mcgee)   . Thyroid disease     Patient Active Problem List   Diagnosis Date Noted  . Failure to thrive (0-17) 04/20/2017  . Adjustment disorder with mixed anxiety and depressed mood 04/16/2017  . SOB (shortness of breath)   . Protein-calorie malnutrition (Audrey Mcgee)   . HCAP (healthcare-associated pneumonia) 03/12/2017  . Hyperthyroidism 03/02/2017  . COPD (chronic obstructive pulmonary disease) ([place]) 03/02/2017  . Subacute delirium 02/18/2017  . Altered mental status 02/12/2017  . Cannabis  abuse 02/12/2017  . Nausea 02/12/2017  . Failure to thrive in adult 02/12/2017  . Hypokalemia 02/12/2017  . Fatigue 02/12/2017  . Lung cancer ([name]) 02/12/2017  . Hypothyroidism 02/11/2017  . Malignancy (Audrey Mcgee)   . Palliative care by specialist   . Goals of care, counseling/discussion   . DNR (do not resuscitate) discussion   . Depressive disorder   . Dehydration 02/07/2017  . Weight loss 02/07/2017  . Encounter for antineoplastic chemotherapy 02/01/2017  . 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 Sepsis (Audrey Mcgee) 11/07/2016  . Protein-calorie malnutrition, severe 11/07/2016  . Acute respiratory distress 06/21/2016  . COPD exacerbation ([name]) 06/21/2016  . Sinus tachycardia 06/21/2016  . Recurrent aspiration bronchitis/pneumonia ([name]) 06/21/2016  . Dysphagia 06/21/2016  . Nausea and vomiting 06/21/2016  . Dental abscess 05/10/2016  . Swallowing difficulty 03/28/2016  . Hypomagnesemia 02/29/2016  . Hoarseness 02/18/2016  . Cancer related pain 07/08/2015  . Adenocarcinoma, lung (Audrey Mcgee) 03/26/2015  . 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 Ear ache 10/03/2014  . Abscess of buccal cavity 10/03/2014  . Lump in neck 10/03/2014  . Laryngeal pain 10/03/2014    Past Surgical History:  Procedure Laterality Date  . CESAREAN SECTION CLASSICAL    . STOMACH SURGERY     Pt reports for a tumor    Prior to Admission medications   Medication Sig Start Date End Date Taking? Authorizing Provider  ALPRAZolam (XANAX) 0.25 MG tablet Take 1 tablet (0.25 mg total) by mouth 3 (three) times daily as needed for anxiety. 04/18/17  Yes [name], Sital,  MD  docusate sodium (COLACE) 100 MG capsule Take 100 mg by mouth 2 (two) times daily as needed for mild constipation.   Yes [provider]  doxycycline (VIBRAMYCIN) 100 MG capsule Take 1 capsule (100 mg total) by mouth 2 (two) times daily. 04/28/17 05/05/17 Yes [name], [name], MD  feeding supplement (BOOST / RESOURCE BREEZE)  LIQD Take 1 Container by mouth 2 (two) times daily between meals. 02/12/17  Yes [name], MD  folic acid (FOLVITE) 478 MCG tablet Take 800 mcg by mouth daily.    Yes [provider]  ipratropium-albuterol (DUONEB) 0.5-2.5 (3) MG/3ML SOLN Take 3 mLs by nebulization every 4 (four) hours. 02/12/17  Yes [name], MD  levothyroxine (SYNTHROID, LEVOTHROID) 100 MCG tablet Take 100 mcg by mouth daily before breakfast.  02/24/17  Yes [provider]  lidocaine (XYLOCAINE) 2 % solution Use as directed 10 mLs in the mouth or throat every 4 (four) hours as needed for mouth pain.   Yes [provider]  magnesium oxide (MAG-OX) 400 MG tablet Take 1 tablet (400 mg total) by mouth 2 (two) times daily. 03/04/17  Yes [name], MD  megestrol (MEGACE) 40 MG/ML suspension Take 5 mLs (200 mg total) by mouth 2 (two) times daily. 04/24/17  Yes [name], [name], MD  methimazole (TAPAZOLE) 5 MG tablet Take 1 tablet (5 mg total) by mouth daily. 03/19/17  Yes [name], MD  metoprolol (LOPRESSOR) 50 MG tablet Take 0.5 tablets (25 mg total) by mouth 2 (two) times daily. 03/04/17  Yes [name], MD  Morphine Sulfate (MORPHINE CONCENTRATE) 10 mg / 0.5 ml concentrated solution Take 0.5 mLs (10 mg total) by mouth every 4 (four) hours as needed for severe pain. 04/18/17  Yes [name], [name], MD  omeprazole (PRILOSEC) 20 MG capsule Take 1 capsule (20 mg total) by mouth daily. 06/20/16  Yes [name], [name], MD  ondansetron (ZOFRAN) 4 MG tablet Take 1 tablet (4 mg total) by mouth every 6 (six) hours as needed for nausea. 02/12/17  Yes [name], MD  potassium chloride 20 MEQ TBCR Take 1 tab twice a day 03/04/17  Yes [name], MD  predniSONE (DELTASONE) 20 MG tablet Take 3 tablets (60 mg total) by mouth daily. 04/28/17 05/02/17 Yes [name], [name], MD  thiamine (VITAMIN B-1) 100 MG tablet Take 1 tablet (100 mg total) by mouth daily. 02/21/17  Yes [name], MD  tiotropium  (SPIRIVA) 18 MCG inhalation capsule Place 1 capsule (18 mcg total) into inhaler and inhale daily. 06/22/16  Yes [name], MD  traZODone (DESYREL) 50 MG tablet Take 1 tablet (50 mg total) by mouth at bedtime as needed for sleep. 04/24/17  Yes Lequita Asal, MD    Allergies No known allergies  Family History  Problem Relation Age of Onset  . Hypertension Father   . Cancer Father   . Diabetes Father   . Cancer Maternal Aunt   . Cancer Mother     Social History Social History  Substance Use Topics  . Smoking status: Current Every Day Smoker    Packs/day: 0.25    Years: 15.00    Types: Cigarettes  . Smokeless tobacco: Never Used  . Alcohol use No    Review of Systems Constitutional: No fever/chills Eyes: No visual changes. ENT: No sore throat. Cardiovascular: Denies chest pain. Respiratory: +shortness of breath. Gastrointestinal: No abdominal pain.  No nausea, no vomiting.  No diarrhea.  No constipation. Genitourinary: Negative for dysuria. Musculoskeletal: Negative  for back pain. Integumentary: Negative for rash. Neurological: Negative for headaches, focal weakness or numbness.   ____________________________________________   PHYSICAL EXAM:  VITAL SIGNS: ED Triage Vitals  Enc Vitals Group     BP 04/28/17 2227 (!) 158/107     Pulse Rate 04/28/17 2227 (!) 188     Resp 04/28/17 2227 (!) 21     Temp 04/28/17 2227 97.8 F (36.6 C)     Temp Source 04/28/17 2227 Oral     SpO2 04/28/17 2227 100 %     Weight 04/28/17 2231 106 lb (48.1 kg)     Height 04/28/17 2231 '5\' 2"'$  (1.575 m)     Head Circumference --      Peak Flow --      Pain Score 04/28/17 2226 7     Pain Loc --      Pain Edu? --      Excl. in [place]? --     Constitutional: Alert and oriented. Well appearing and in no acute distress. Eyes: Conjunctivae are normal. PERRL. EOMI. Head: Atraumatic. Nose: No congestion/rhinnorhea. Mouth/Throat: Mucous membranes are moist. Neck: No stridor.  No  meningeal signs.   Cardiovascular: Normal rate, regular rhythm. Good peripheral circulation. Grossly normal heart sounds. Respiratory: Normal respiratory effort.  No retractions.  Minimal expiratory wheezing.  Decreased air movement throughout but appears to be at baseline. Gastrointestinal: Soft and nontender. No distention.  Musculoskeletal: No lower extremity tenderness nor edema. No gross deformities of extremities. Neurologic:  Normal speech and language. No gross focal neurologic deficits are appreciated.  Skin:  Skin is warm, dry and intact. No rash noted.   ____________________________________________   LABS (all labs ordered are listed, but only abnormal results are displayed)  Labs Reviewed  CBC WITH DIFFERENTIAL/PLATELET - Abnormal; Notable for the following:       Result Value   RDW 15.5 (*)    Monocytes Absolute 0.1 (*)    All other components within normal limits  COMPREHENSIVE METABOLIC PANEL - Abnormal; Notable for the following:    Glucose, Bld 170 (*)    BUN 5 (*)    ALT 9 (*)    Total Bilirubin 1.6 (*)    All other components within normal limits  TROPONIN I   ____________________________________________  EKG  ED ECG REPORT I, Reina Wilton, the attending physician, personally viewed and interpreted this ECG.  Date: 04/28/2017 EKG Time: 22:30 Rate: 119 Rhythm: sinus tachycardia QRS Axis: normal Intervals: normal ST/T Wave abnormalities: LVH, otherwise unremarkable Conduction Disturbances: none Narrative Interpretation: unremarkable  ____________________________________________  RADIOLOGY   Dg Chest 2 View  Result Date: 04/28/2017 CLINICAL DATA:  56 y/o  F; shortness of breath. EXAM: CHEST  2 VIEW COMPARISON:  04/22/2017 chest radiograph FINDINGS: Stable normal cardiac silhouette. Stable elevated left hemidiaphragm. No focal consolidation, effusion, or pneumothorax. Stable cavitary nodule projecting over the left fifth posterior rib. Bones are  unremarkable. IMPRESSION: Stable left mid lung zone cavitary nodule. Elevated left hemidiaphragm. No Audrey acute pulmonary process. Electronically Signed   By: Kristine Garbe M.D.   On: 04/28/2017 06:26    ____________________________________________   PROCEDURES  Critical Care performed: No   Procedure(s) performed:   Procedures   ____________________________________________   INITIAL IMPRESSION / ASSESSMENT AND PLAN / ED COURSE  Pertinent labs & imaging results that were available during my care of the patient were reviewed by me and considered in my medical decision making (see chart for details).  The patient is well-appearing and in no acute  distress at this time.  She has extensive chronic medical issues but does not appear to have any acute or emergent medical conditions.  He had one recorded heart rate of 188 but this may be erroneous as she is Chronically tachycardic as verified in the EMR but at no point do I have a specific example of her rapid heart rate that was just SVT or A. fib with RVR.  She is stable and at her baseline and states that she is ready to go home and has not fact already called some friends to pick her up.  She received prescriptions earlier today.  I gave my usual and customary return precautions.         ____________________________________________  FINAL CLINICAL IMPRESSION(S) / ED DIAGNOSES  Final diagnoses:  COPD exacerbation ([place])     MEDICATIONS GIVEN DURING THIS VISIT:  Medications  albuterol (PROVENTIL) (2.5 MG/3ML) 0.083% nebulizer solution 5 mg (5 mg Nebulization Given 04/28/17 2347)     Audrey OUTPATIENT MEDICATIONS STARTED DURING THIS VISIT:  Audrey Prescriptions   No medications on file    Modified Medications   No medications on file    Discontinued Medications   No medications on file     Note:  This document was prepared using Dragon voice recognition software and may include unintentional dictation  errors.    Hinda Kehr, MD 04/29/17 Dyann Kief

## 2017-04-28 NOTE — ED Provider Notes (Signed)
[facility] Emergency Department Provider Note  ____________________________________________  Time seen: Approximately 7:48 AM  I have reviewed the triage vital signs and the nursing notes.   HISTORY  Chief Complaint Shortness of Breath   HPI Audrey Mcgee is a 56 y.o. female with a history of metastatic lung cancer on chemotherapy, COPD, hypertension, hyperthyroidism, failure to thrive who presents for evaluation of shortness of breath. Patient reports that yesterday evening she got into an argument with her daughter. She was very upset throughout the night and developed worsening shortness of breath compared to her baseline. She endorses a cough however that is chronic. No fever or chills, no chest pain, no vomiting or diarrhea. She is complaining of nausea. She reports that she has been using her nebulizer at home this morning with no relief. Patient restarted chemotherapy last week. She currently endorses moderate shortness of breath, constant, worse with exertion.  Past Medical History:  Diagnosis Date  . COPD (chronic obstructive pulmonary disease) ([place])   . Dementia   . Hypertension   . Lung cancer ([place])   . Thyroid disease     Patient Active Problem List   Diagnosis Date Noted  . Failure to thrive (0-17) 04/20/2017  . Adjustment disorder with mixed anxiety and depressed mood 04/16/2017  . SOB (shortness of breath)   . Protein-calorie malnutrition ([place])   . HCAP (healthcare-associated pneumonia) 03/12/2017  . Hyperthyroidism 03/02/2017  . COPD (chronic obstructive pulmonary disease) ([place]) 03/02/2017  . Subacute delirium 02/18/2017  . Altered mental status 02/12/2017  . Cannabis abuse 02/12/2017  . Nausea 02/12/2017  . Failure to thrive in adult 02/12/2017  . Hypokalemia 02/12/2017  . Fatigue 02/12/2017  . Lung cancer ([place]) 02/12/2017  . Hypothyroidism 02/11/2017  . Malignancy ([place])   . Palliative care by specialist   . Goals of  care, counseling/discussion   . DNR (do not resuscitate) discussion   . Depressive disorder   . Dehydration 02/07/2017  . Weight loss 02/07/2017  . Encounter for antineoplastic chemotherapy 02/01/2017  . Metastasis to adrenal gland ([place]) 01/29/2017  . Sepsis ([place]) 11/07/2016  . Protein-calorie malnutrition, severe 11/07/2016  . Acute respiratory distress 06/21/2016  . COPD exacerbation ([place]) 06/21/2016  . Sinus tachycardia 06/21/2016  . Recurrent aspiration bronchitis/pneumonia ([place]) 06/21/2016  . Dysphagia 06/21/2016  . Nausea and vomiting 06/21/2016  . Dental abscess 05/10/2016  . Swallowing difficulty 03/28/2016  . Hypomagnesemia 02/29/2016  . Hoarseness 02/18/2016  . Cancer related pain 07/08/2015  . Adenocarcinoma, lung ([place]) 03/26/2015  . Metastasis to supraclavicular lymph node ([place]) 10/13/2014  . Ear ache 10/03/2014  . Abscess of buccal cavity 10/03/2014  . Lump in neck 10/03/2014  . Laryngeal pain 10/03/2014    Past Surgical History:  Procedure Laterality Date  . CESAREAN SECTION CLASSICAL    . STOMACH SURGERY     Pt reports for a tumor    Prior to Admission medications   Medication Sig Start Date End Date Taking? Authorizing Provider  ALPRAZolam (XANAX) 0.25 MG tablet Take 1 tablet (0.25 mg total) by mouth 3 (three) times daily as needed for anxiety. 04/18/17  Yes [name], [name], MD  docusate sodium (COLACE) 100 MG capsule Take 100 mg by mouth 2 (two) times daily as needed for mild constipation.   Yes [provider]  folic acid (FOLVITE) 790 MCG tablet Take 800 mcg by mouth daily.    Yes [provider]  ipratropium-albuterol (DUONEB) 0.5-2.5 (3) MG/3ML SOLN Take 3 mLs by nebulization  every 4 (four) hours. 02/12/17  Yes [name], MD  levothyroxine (SYNTHROID, LEVOTHROID) 100 MCG tablet Take 100 mcg by mouth daily before breakfast.  02/24/17  Yes [provider]  lidocaine (XYLOCAINE) 2 % solution Use as directed 10 mLs in the mouth or  throat every 4 (four) hours as needed for mouth pain.   Yes [provider]  magnesium oxide (MAG-OX) 400 MG tablet Take 1 tablet (400 mg total) by mouth 2 (two) times daily. 03/04/17  Yes [name], MD  megestrol (MEGACE) 40 MG/ML suspension Take 5 mLs (200 mg total) by mouth 2 (two) times daily. 04/24/17  Yes [name], [name], MD  methimazole (TAPAZOLE) 5 MG tablet Take 1 tablet (5 mg total) by mouth daily. 03/19/17  Yes [name], MD  metoprolol (LOPRESSOR) 50 MG tablet Take 0.5 tablets (25 mg total) by mouth 2 (two) times daily. 03/04/17  Yes [name], MD  Morphine Sulfate (MORPHINE CONCENTRATE) 10 mg / 0.5 ml concentrated solution Take 0.5 mLs (10 mg total) by mouth every 4 (four) hours as needed for severe pain. 04/18/17  Yes [name], [name], MD  omeprazole (PRILOSEC) 20 MG capsule Take 1 capsule (20 mg total) by mouth daily. 06/20/16  Yes [name], [name], MD  ondansetron (ZOFRAN) 4 MG tablet Take 1 tablet (4 mg total) by mouth every 6 (six) hours as needed for nausea. 02/12/17  Yes [name], MD  potassium chloride 20 MEQ TBCR Take 1 tab twice a day 03/04/17  Yes [name], MD  thiamine (VITAMIN B-1) 100 MG tablet Take 1 tablet (100 mg total) by mouth daily. 02/21/17  Yes [name], MD  tiotropium (SPIRIVA) 18 MCG inhalation capsule Place 1 capsule (18 mcg total) into inhaler and inhale daily. 06/22/16  Yes [name], MD  traZODone (DESYREL) 50 MG tablet Take 1 tablet (50 mg total) by mouth at bedtime as needed for sleep. 04/24/17  Yes [name], [name], MD  doxycycline (VIBRAMYCIN) 100 MG capsule Take 1 capsule (100 mg total) by mouth 2 (two) times daily. 04/28/17 05/05/17  Rudene Re, MD  feeding supplement (BOOST / RESOURCE BREEZE) LIQD Take 1 Container by mouth 2 (two) times daily between meals. 02/12/17   [name], MD  predniSONE (DELTASONE) 20 MG tablet Take 3 tablets (60 mg total) by mouth daily. 04/28/17 05/02/17  Rudene Re, MD     Allergies No known allergies  Family History  Problem Relation Age of Onset  . Hypertension Father   . Cancer Father   . Diabetes Father   . Cancer Maternal Aunt   . Cancer Mother     Social History Social History  Substance Use Topics  . Smoking status: Current Every Day Smoker    Packs/day: 0.25    Years: 15.00    Types: Cigarettes  . Smokeless tobacco: Never Used  . Alcohol use No    Review of Systems  Constitutional: Negative for fever. Eyes: Negative for visual changes. ENT: Negative for sore throat. Neck: No neck pain  Cardiovascular: Negative for chest pain. Respiratory: +shortness of breath. Gastrointestinal: Negative for abdominal pain, vomiting or diarrhea. + nausea Genitourinary: Negative for dysuria. Musculoskeletal: Negative for back pain. Skin: Negative for rash. Neurological: Negative for headaches, weakness or numbness. Psych: No SI or HI  ____________________________________________   PHYSICAL EXAM:  VITAL SIGNS: Vitals:   04/28/17 0730 04/28/17 0800  BP: (!) 141/109 (!) 150/105  Pulse: (!) 108 (!) 108  Resp: 15 (!) 29  Temp:  Constitutional: Alert and oriented, cachectic in no distress.  HEENT:      Head: Normocephalic and atraumatic.         Eyes: Conjunctivae are normal. Sclera is non-icteric. EOMI. PERRL      Mouth/Throat: Mucous membranes are moist.       Neck: Supple with no signs of meningismus. Cardiovascular: Regular rate and rhythm. No murmurs, gallops, or rubs. 2+ symmetrical distal pulses are present in all extremities. No JVD. Respiratory: Normal work of breathing, diminished air movement with faint expiratory wheezes  Gastrointestinal: Soft, non tender, and non distended with positive bowel sounds. No rebound or guarding. Musculoskeletal: Nontender with normal range of motion in all extremities. No edema, cyanosis, or erythema of extremities. Neurologic: Normal speech and language. Face is symmetric. Moving all  extremities. No gross focal neurologic deficits are appreciated. Skin: Skin is warm, dry and intact. No rash noted. Psychiatric: Mood and affect are normal. Speech and behavior are normal.  ____________________________________________   LABS (all labs ordered are listed, but only abnormal results are displayed)  Labs Reviewed  CBC - Abnormal; Notable for the following:       Result Value   RDW 15.3 (*)    All other components within normal limits  BASIC METABOLIC PANEL - Abnormal; Notable for the following:    Potassium 3.0 (*)    All other components within normal limits  T4, FREE - Abnormal; Notable for the following:    Free T4 1.20 (*)    All other components within normal limits  TSH - Abnormal; Notable for the following:    TSH <0.010 (*)    All other components within normal limits  LACTIC ACID, PLASMA  TROPONIN I   ____________________________________________  EKG  ED ECG REPORT I, Rudene Re, the attending physician, personally viewed and interpreted this ECG.  Sinus tachycardia, rate of 119, normal intervals, normal axis, no ST elevations or depressions. Unchanged from prior.  ____________________________________________  RADIOLOGY  CXR:  Stable left mid lung zone cavitary nodule. Elevated left hemidiaphragm. No new acute pulmonary process. ____________________________________________   PROCEDURES  Procedure(s) performed: None Procedures Critical Care performed:  None ____________________________________________   INITIAL IMPRESSION / ASSESSMENT AND PLAN / ED COURSE  56 y.o. female with a history of metastatic lung cancer on chemotherapy, COPD, hypertension, hyperthyroidism, failure to thrive who presents for evaluation of shortness of breath after being in an argument with her daughter yesterday. Patient is in no respiratory distress at this time, she is afebrile, she has slightly diminished air movement with faint expiratory wheezes. Her sats  are normal on room air. Patient's initial blood pressure in triage was 98/79 which improved to 136/100 without any interventions during my evaluation. We'll give DuoNeb treatments, Solu-Medrol, check basic blood work and chest x-ray.  Clinical Course as of Apr 28 1013  Tue Apr 28, 2017  1025 T4,Free(Direct): (!) 1.20 [CV]  1011 Lab work with no acute findings. Negative troponin, normal lactic acid, normal white count. Mildly low potassium which was supplemented by mouth. Patient's shortness of breath has resolved after DuoNeb treatments and Solu-Medrol. Chest x-ray with no changes. Patient ambulated without dropping her sats. She is going to be discharged home with close follow-up with her oncologist. Patient has been persistently tachycardic here which review of Epic seems to be patient's baseline.   [CV]    Clinical Course User Index [CV] Rudene Re, MD    Pertinent labs & imaging results that were available during my care of the  patient were reviewed by me and considered in my medical decision making (see chart for details).    ____________________________________________   FINAL CLINICAL IMPRESSION(S) / ED DIAGNOSES  Final diagnoses:  COPD exacerbation ([place])      NEW MEDICATIONS STARTED DURING THIS VISIT:  New Prescriptions   DOXYCYCLINE (VIBRAMYCIN) 100 MG CAPSULE    Take 1 capsule (100 mg total) by mouth 2 (two) times daily.   PREDNISONE (DELTASONE) 20 MG TABLET    Take 3 tablets (60 mg total) by mouth daily.     Note:  This document was prepared using Dragon voice recognition software and may include unintentional dictation errors.    Alfred Levins, [name], MD 04/28/17 1014

## 2017-04-28 NOTE — ED Triage Notes (Signed)
Per EMS, pt from home with [facility] and chest palpitations. Pt states she was here last night and felt better however was at home cooking on the grill and states "the smoke made me have shortness of breath." Pt has hx of stage 4 lung cancer, COPD and currently smokes cigarettes. Pt also reports fast heart beat and states "a little bit of chest pain when I take a deep breath." Pt A&O at this time.

## 2017-04-28 NOTE — ED Triage Notes (Signed)
Pt brought in by [facility] from home.  Pt woke up with c/o shortness of breath, states that it was exacerbated after argument with daughter this AM.  Pt vitals WNL in route with EMS.  Per EMS, pt still being treated for stage IV lung cancer and had last treatment last week.

## 2017-04-28 NOTE — Discharge Instructions (Signed)

## 2017-04-28 NOTE — ED Notes (Signed)
Pt was able to walk down the hall and pts o2 sats did not drop. They stayed around 97 to 98% of room air.

## 2017-04-29 ENCOUNTER — Observation Stay
Admission: EM | Admit: 2017-04-29 | Discharge: 2017-05-08 | Disposition: A | Discharge status: Skilled Nursing Facility | Payer: Medicaid Other | Attending: Internal Medicine | Admitting: Internal Medicine

## 2017-04-29 ENCOUNTER — Emergency Department: Payer: Medicaid Other

## 2017-04-29 ENCOUNTER — Encounter: Payer: Self-pay | Admitting: Radiology

## 2017-04-29 DIAGNOSIS — F0391 Unspecified dementia with behavioral disturbance: Secondary | ICD-10-CM | POA: Insufficient documentation

## 2017-04-29 DIAGNOSIS — E035 Myxedema coma: Secondary | ICD-10-CM | POA: Insufficient documentation

## 2017-04-29 DIAGNOSIS — Z7982 Long term (current) use of aspirin: Secondary | ICD-10-CM | POA: Diagnosis not present

## 2017-04-29 DIAGNOSIS — I700 Atherosclerosis of aorta: Secondary | ICD-10-CM | POA: Diagnosis not present

## 2017-04-29 DIAGNOSIS — R7989 Other specified abnormal findings of blood chemistry: Secondary | ICD-10-CM | POA: Diagnosis present

## 2017-04-29 DIAGNOSIS — C7989 Secondary malignant neoplasm of other specified sites: Secondary | ICD-10-CM | POA: Diagnosis not present

## 2017-04-29 DIAGNOSIS — Z681 Body mass index (BMI) 19 or less, adult: Secondary | ICD-10-CM | POA: Insufficient documentation

## 2017-04-29 DIAGNOSIS — F17210 Nicotine dependence, cigarettes, uncomplicated: Secondary | ICD-10-CM | POA: Insufficient documentation

## 2017-04-29 DIAGNOSIS — G893 Neoplasm related pain (acute) (chronic): Secondary | ICD-10-CM | POA: Diagnosis not present

## 2017-04-29 DIAGNOSIS — J441 Chronic obstructive pulmonary disease with (acute) exacerbation: Secondary | ICD-10-CM

## 2017-04-29 DIAGNOSIS — R634 Abnormal weight loss: Secondary | ICD-10-CM | POA: Diagnosis present

## 2017-04-29 DIAGNOSIS — Z79899 Other long term (current) drug therapy: Secondary | ICD-10-CM | POA: Diagnosis not present

## 2017-04-29 DIAGNOSIS — Z9221 Personal history of antineoplastic chemotherapy: Secondary | ICD-10-CM | POA: Diagnosis not present

## 2017-04-29 DIAGNOSIS — E876 Hypokalemia: Secondary | ICD-10-CM | POA: Diagnosis not present

## 2017-04-29 DIAGNOSIS — K219 Gastro-esophageal reflux disease without esophagitis: Secondary | ICD-10-CM | POA: Insufficient documentation

## 2017-04-29 DIAGNOSIS — C7970 Secondary malignant neoplasm of unspecified adrenal gland: Secondary | ICD-10-CM | POA: Insufficient documentation

## 2017-04-29 DIAGNOSIS — C7800 Secondary malignant neoplasm of unspecified lung: Secondary | ICD-10-CM | POA: Insufficient documentation

## 2017-04-29 DIAGNOSIS — R079 Chest pain, unspecified: Secondary | ICD-10-CM

## 2017-04-29 DIAGNOSIS — R0602 Shortness of breath: Secondary | ICD-10-CM

## 2017-04-29 DIAGNOSIS — E860 Dehydration: Secondary | ICD-10-CM | POA: Diagnosis not present

## 2017-04-29 DIAGNOSIS — F03918 Unspecified dementia, unspecified severity, with other behavioral disturbance: Secondary | ICD-10-CM

## 2017-04-29 DIAGNOSIS — Z7952 Long term (current) use of systemic steroids: Secondary | ICD-10-CM | POA: Diagnosis not present

## 2017-04-29 DIAGNOSIS — R000 Tachycardia, unspecified: Secondary | ICD-10-CM | POA: Diagnosis not present

## 2017-04-29 DIAGNOSIS — Z923 Personal history of irradiation: Secondary | ICD-10-CM | POA: Diagnosis not present

## 2017-04-29 DIAGNOSIS — Z85118 Personal history of other malignant neoplasm of bronchus and lung: Secondary | ICD-10-CM | POA: Diagnosis not present

## 2017-04-29 DIAGNOSIS — I10 Essential (primary) hypertension: Secondary | ICD-10-CM | POA: Diagnosis not present

## 2017-04-29 DIAGNOSIS — C3490 Malignant neoplasm of unspecified part of unspecified bronchus or lung: Secondary | ICD-10-CM | POA: Diagnosis present

## 2017-04-29 DIAGNOSIS — Z79891 Long term (current) use of opiate analgesic: Secondary | ICD-10-CM | POA: Insufficient documentation

## 2017-04-29 DIAGNOSIS — E0590 Thyrotoxicosis, unspecified without thyrotoxic crisis or storm: Secondary | ICD-10-CM | POA: Diagnosis not present

## 2017-04-29 DIAGNOSIS — R778 Other specified abnormalities of plasma proteins: Secondary | ICD-10-CM | POA: Diagnosis present

## 2017-04-29 LAB — CBC
HEMATOCRIT: 30.7 % — AB (ref 35.0–47.0)
Hemoglobin: 10.3 g/dL — ABNORMAL LOW (ref 12.0–16.0)
MCH: 28.4 pg (ref 26.0–34.0)
MCHC: 33.5 g/dL (ref 32.0–36.0)
MCV: 84.9 fL (ref 80.0–100.0)
Platelets: 198 10*3/uL (ref 150–440)
RBC: 3.62 MIL/uL — AB (ref 3.80–5.20)
RDW: 15.7 % — ABNORMAL HIGH (ref 11.5–14.5)
WBC: 8.3 10*3/uL (ref 3.6–11.0)

## 2017-04-29 LAB — BASIC METABOLIC PANEL
ANION GAP: 8 (ref 5–15)
BUN: 6 mg/dL (ref 6–20)
CO2: 24 mmol/L (ref 22–32)
Calcium: 8.6 mg/dL — ABNORMAL LOW (ref 8.9–10.3)
Chloride: 105 mmol/L (ref 101–111)
Creatinine, Ser: 0.61 mg/dL (ref 0.44–1.00)
GFR calc Af Amer: >60 mL/min (ref >60)
GLUCOSE: 133 mg/dL — AB (ref 65–99)
POTASSIUM: 4.2 mmol/L (ref 3.5–5.1)
Sodium: 137 mmol/L (ref 135–145)

## 2017-04-29 LAB — TROPONIN I
Troponin I: 0.25 ng/mL (ref <0.03)
Troponin I: 0.25 ng/mL (ref <0.03)

## 2017-04-29 MED ORDER — ACETAMINOPHEN 325 MG PO TABS: 650.0000 mg | ORAL_TABLET | ORAL | Status: DC | PRN

## 2017-04-29 MED ORDER — BOOST / RESOURCE BREEZE PO LIQD
1.0000 | Freq: Two times a day (BID) | ORAL | Status: DC
  Administered 2017-04-30 – 2017-05-08 (×14): 1 via ORAL

## 2017-04-29 MED ORDER — MAGNESIUM OXIDE 400 (241.3 MG) MG PO TABS
400.0000 mg | ORAL_TABLET | Freq: Two times a day (BID) | ORAL | Status: DC
  Administered 2017-04-29 – 2017-05-08 (×18): 400 mg via ORAL
  Filled 2017-04-29 (×18): qty 1

## 2017-04-29 MED ORDER — IPRATROPIUM-ALBUTEROL 0.5-2.5 (3) MG/3ML IN SOLN
3.0000 mL | Freq: Once | RESPIRATORY_TRACT | Status: AC
  Administered 2017-04-29: 3 mL via RESPIRATORY_TRACT
  Filled 2017-04-29: qty 3

## 2017-04-29 MED ORDER — ENOXAPARIN SODIUM 60 MG/0.6ML ~~LOC~~ SOLN
50.0000 mg | Freq: Two times a day (BID) | SUBCUTANEOUS | Status: DC
  Administered 2017-04-29 – 2017-04-30 (×3): 50 mg via SUBCUTANEOUS
  Filled 2017-04-29 (×5): qty 0.6

## 2017-04-29 MED ORDER — TIOTROPIUM BROMIDE MONOHYDRATE 18 MCG IN CAPS
18.0000 ug | ORAL_CAPSULE | Freq: Every day | RESPIRATORY_TRACT | Status: DC
  Administered 2017-04-30 – 2017-05-08 (×9): 18 ug via RESPIRATORY_TRACT
  Filled 2017-04-29 (×2): qty 5

## 2017-04-29 MED ORDER — ZOLPIDEM TARTRATE 5 MG PO TABS
5.0000 mg | ORAL_TABLET | Freq: Every evening | ORAL | Status: DC | PRN
  Administered 2017-04-30 – 2017-05-08 (×8): 5 mg via ORAL
  Filled 2017-04-29 (×8): qty 1

## 2017-04-29 MED ORDER — SODIUM CHLORIDE 0.9 % IV BOLUS (SEPSIS)
1000.0000 mL | Freq: Once | INTRAVENOUS | Status: AC
  Administered 2017-04-29: 1000 mL via INTRAVENOUS

## 2017-04-29 MED ORDER — VITAMIN B-1 100 MG PO TABS
100.0000 mg | ORAL_TABLET | Freq: Every day | ORAL | Status: DC
  Administered 2017-04-30 – 2017-05-08 (×9): 100 mg via ORAL
  Filled 2017-04-29 (×9): qty 1

## 2017-04-29 MED ORDER — GI COCKTAIL ~~LOC~~
30.0000 mL | Freq: Four times a day (QID) | ORAL | Status: DC | PRN
  Filled 2017-04-29: qty 30

## 2017-04-29 MED ORDER — MEGESTROL ACETATE 40 MG/ML PO SUSP
200.0000 mg | Freq: Two times a day (BID) | ORAL | Status: DC
  Administered 2017-04-29 – 2017-05-08 (×18): 200 mg via ORAL
  Filled 2017-04-29 (×20): qty 5

## 2017-04-29 MED ORDER — LORAZEPAM 2 MG/ML IJ SOLN
1.0000 mg | Freq: Once | INTRAMUSCULAR | Status: AC
Start: 2017-04-29 — End: 2017-04-29
  Administered 2017-04-29: 1 mg via INTRAMUSCULAR
  Filled 2017-04-29: qty 1

## 2017-04-29 MED ORDER — ASPIRIN EC 325 MG PO TBEC: 325.0000 mg | DELAYED_RELEASE_TABLET | Freq: Every day | ORAL | Status: DC

## 2017-04-29 MED ORDER — DOXYCYCLINE HYCLATE 100 MG PO TABS
100.0000 mg | ORAL_TABLET | Freq: Once | ORAL | Status: AC
  Administered 2017-04-29: 100 mg via ORAL
  Filled 2017-04-29: qty 1

## 2017-04-29 MED ORDER — ALPRAZOLAM 0.5 MG PO TABS
0.2500 mg | ORAL_TABLET | Freq: Three times a day (TID) | ORAL | Status: DC | PRN
Start: 2017-04-29 — End: 2017-05-08
  Administered 2017-05-01 – 2017-05-02 (×3): 0.25 mg via ORAL
  Administered 2017-05-03: 07:00:00 0.5 mg via ORAL
  Administered 2017-05-03 – 2017-05-07 (×11): 0.25 mg via ORAL
  Filled 2017-04-29 (×16): qty 1

## 2017-04-29 MED ORDER — FOLIC ACID 1 MG PO TABS
1.0000 mg | ORAL_TABLET | Freq: Every day | ORAL | Status: DC
  Administered 2017-04-30 – 2017-05-08 (×9): 1 mg via ORAL
  Filled 2017-04-29 (×9): qty 1

## 2017-04-29 MED ORDER — ATORVASTATIN CALCIUM 20 MG PO TABS
80.0000 mg | ORAL_TABLET | Freq: Every day | ORAL | Status: DC
  Administered 2017-04-29 – 2017-05-07 (×8): 80 mg via ORAL
  Filled 2017-04-29 (×9): qty 4

## 2017-04-29 MED ORDER — IOPAMIDOL (ISOVUE-370) INJECTION 76%
75.0000 mL | Freq: Once | INTRAVENOUS | Status: AC | PRN
  Administered 2017-04-29: 75 mL via INTRAVENOUS

## 2017-04-29 MED ORDER — MORPHINE SULFATE (PF) 2 MG/ML IV SOLN
2.0000 mg | Freq: Once | INTRAVENOUS | Status: DC
Start: 2017-04-29 — End: 2017-04-29
  Filled 2017-04-29: qty 1

## 2017-04-29 MED ORDER — PREDNISONE 50 MG PO TABS
60.0000 mg | ORAL_TABLET | Freq: Every day | ORAL | Status: DC
  Administered 2017-04-30 – 2017-05-08 (×9): 60 mg via ORAL
  Filled 2017-04-29 (×4): qty 1
  Filled 2017-04-29: qty 3
  Filled 2017-04-29 (×4): qty 1

## 2017-04-29 MED ORDER — IPRATROPIUM-ALBUTEROL 0.5-2.5 (3) MG/3ML IN SOLN
3.0000 mL | RESPIRATORY_TRACT | Status: DC
  Administered 2017-04-29 – 2017-04-30 (×3): 3 mL via RESPIRATORY_TRACT
  Filled 2017-04-29 (×2): qty 3

## 2017-04-29 MED ORDER — LIDOCAINE VISCOUS 2 % MT SOLN
10.0000 mL | OROMUCOSAL | Status: DC | PRN
  Filled 2017-04-29: qty 10

## 2017-04-29 MED ORDER — ONDANSETRON HCL 4 MG/2ML IJ SOLN
4.0000 mg | Freq: Four times a day (QID) | INTRAMUSCULAR | Status: DC | PRN
  Filled 2017-04-29: qty 2

## 2017-04-29 MED ORDER — MORPHINE SULFATE (CONCENTRATE) 10 MG/0.5ML PO SOLN
10.0000 mg | ORAL | Status: DC | PRN
  Administered 2017-04-29 – 2017-05-08 (×9): 10 mg via ORAL
  Filled 2017-04-29 (×11): qty 1

## 2017-04-29 MED ORDER — MORPHINE SULFATE (PF) 2 MG/ML IV SOLN
2.0000 mg | Freq: Once | INTRAVENOUS | Status: AC
  Administered 2017-04-29: 2 mg via INTRAMUSCULAR

## 2017-04-29 MED ORDER — HEPARIN SODIUM (PORCINE) 5000 UNIT/ML IJ SOLN: 5000.0000 [IU] | Freq: Three times a day (TID) | INTRAMUSCULAR | Status: DC

## 2017-04-29 MED ORDER — PREDNISONE 20 MG PO TABS
60.0000 mg | ORAL_TABLET | Freq: Once | ORAL | Status: AC
  Administered 2017-04-29: 60 mg via ORAL
  Filled 2017-04-29: qty 3

## 2017-04-29 MED ORDER — LEVOTHYROXINE SODIUM 50 MCG PO TABS
50.0000 ug | ORAL_TABLET | Freq: Every day | ORAL | Status: DC
  Administered 2017-05-01 – 2017-05-08 (×8): 50 ug via ORAL
  Filled 2017-04-29 (×8): qty 1

## 2017-04-29 MED ORDER — ONDANSETRON HCL 4 MG PO TABS
4.0000 mg | ORAL_TABLET | Freq: Four times a day (QID) | ORAL | Status: DC | PRN
  Administered 2017-05-04: 18:00:00 4 mg via ORAL
  Filled 2017-04-29: qty 1

## 2017-04-29 MED ORDER — PANTOPRAZOLE SODIUM 40 MG PO TBEC
40.0000 mg | DELAYED_RELEASE_TABLET | Freq: Every day | ORAL | Status: DC
  Administered 2017-04-30 – 2017-05-08 (×9): 40 mg via ORAL
  Filled 2017-04-29 (×9): qty 1

## 2017-04-29 MED ORDER — IPRATROPIUM-ALBUTEROL 0.5-2.5 (3) MG/3ML IN SOLN: 3.0000 mL | RESPIRATORY_TRACT | Status: DC

## 2017-04-29 MED ORDER — ASPIRIN 81 MG PO CHEW
324.0000 mg | CHEWABLE_TABLET | Freq: Once | ORAL | Status: AC
  Administered 2017-04-29: 324 mg via ORAL
  Filled 2017-04-29: qty 4

## 2017-04-29 MED ORDER — METHIMAZOLE 10 MG PO TABS
5.0000 mg | ORAL_TABLET | Freq: Every day | ORAL | Status: DC
  Administered 2017-04-30 – 2017-05-08 (×9): 5 mg via ORAL
  Filled 2017-04-29 (×9): qty 1

## 2017-04-29 MED ORDER — METOPROLOL TARTRATE 25 MG PO TABS
25.0000 mg | ORAL_TABLET | Freq: Two times a day (BID) | ORAL | Status: DC
  Administered 2017-04-29 – 2017-05-08 (×18): 25 mg via ORAL
  Filled 2017-04-29 (×18): qty 1

## 2017-04-29 MED ORDER — POTASSIUM CHLORIDE CRYS ER 20 MEQ PO TBCR
20.0000 meq | EXTENDED_RELEASE_TABLET | Freq: Two times a day (BID) | ORAL | Status: DC
  Administered 2017-04-29 – 2017-05-04 (×10): 20 meq via ORAL
  Filled 2017-04-29 (×10): qty 1

## 2017-04-29 NOTE — Progress Notes (Signed)
ANTICOAGULATION CONSULT NOTE - Initial Consult  Pharmacy Consult for Lovenox  Indication: chest pain/ACS  Allergies  Allergen Reactions  . No Known Allergies     Patient Measurements: Height: '5\' 4"'$  (162.6 cm) Weight: 105 lb (47.6 kg) IBW/kg (Calculated) : 54.7 Heparin Dosing Weight:   Vital Signs: BP: 149/89 (05/09 2030) Pulse Rate: 101 (05/09 2030)  Labs:  Recent Labs  04/28/17 0554 04/28/17 0625 04/28/17 0728 04/28/17 2249 04/29/17 1756  HGB 13.7  --   --  13.1 10.3*  HCT 41.9  --   --  39.4 30.7*  PLT 243  --   --  275 198  CREATININE  --  0.66  --  0.65 0.61  TROPONINI  --   --  <0.03 <0.03 0.25*    Estimated Creatinine Clearance: 60.4 mL/min (by C-G formula based on SCr of 0.61 mg/dL).   Medical History: Past Medical History:  Diagnosis Date  . COPD (chronic obstructive pulmonary disease) ([place])   . Dementia   . Hypertension   . Lung cancer ([place])   . Thyroid disease     Medications:   (Not in a hospital admission)  Assessment: Pharmacy consulted to dose lovenox in this 56 year old female admitted with ACS/NSTEMI.  CrCl = 60.4 ml/min,  TBW = 47.6 kg  No prior anticoag noted.   Goal of Therapy:   Monitor platelets by anticoagulation protocol: Yes   Plan:  Lovenox 50 mg IV Q12H ordered to start on 5/9 @ 21:30. Will check CBC daily.   Audrey Mcgee D 04/29/2017,9:14 PM

## 2017-04-29 NOTE — ED Notes (Signed)
IV team unable to successfully start IV. MD made aware and at bedside with patient discussing possible options.

## 2017-04-29 NOTE — ED Notes (Signed)
Attempted to start PIV.  Attempt unsuccessful.  Sharee Pimple, RN Charge nurse notified and assessed patient for PIV. No access opportunities seen. Dr. Alfred Levins notified.  IV team consult ordered.

## 2017-04-29 NOTE — ED Notes (Signed)
Answered call light. Pt wants door to stay open.

## 2017-04-29 NOTE — Progress Notes (Signed)
Attempted PIV with ultrasound, unsuccessful. Unable to lie back for upper arm insertion. PICC ordered, RN aware that patient unable to lie back for PICC insertion.

## 2017-04-29 NOTE — ED Provider Notes (Signed)
[facility] Emergency Department Provider Note  ____________________________________________  Time seen: Approximately 8:38 AM  I have reviewed the triage vital signs and the nursing notes.   HISTORY  Chief Complaint Shortness of Breath   HPI Marijean Montanye is a 56 y.o. female with a history of metastatic lung cancer on chemotherapy, COPD, hypertension, hyperthyroidism, failure to thrive who presents for evaluation of shortness of breath. This is patient's third visit in 24 hours for shortness of breath and chest pain. Patient is teary and crying. She says she is scared. She's been feeling very short of breath especially when she walks. She is also complaining of chest pain which has been constant since her diagnosis of cancer located in the center of her chest, dull, and severe. She reports compliance with her pain meds and inhalers. She denies fever, N/V/D. Her SOB is mild at rest and severe with minimal exertion, constant, and not relieved by breathing treatments.   Past Medical History:  Diagnosis Date  . COPD (chronic obstructive pulmonary disease) ([place])   . Dementia   . Hypertension   . Lung cancer ([place])   . Thyroid disease     Patient Active Problem List   Diagnosis Date Noted  . Failure to thrive (0-17) 04/20/2017  . Adjustment disorder with mixed anxiety and depressed mood 04/16/2017  . SOB (shortness of breath)   . Protein-calorie malnutrition ([place])   . HCAP (healthcare-associated pneumonia) 03/12/2017  . Hyperthyroidism 03/02/2017  . COPD (chronic obstructive pulmonary disease) ([place]) 03/02/2017  . Subacute delirium 02/18/2017  . Altered mental status 02/12/2017  . Cannabis abuse 02/12/2017  . Nausea 02/12/2017  . Failure to thrive in adult 02/12/2017  . Hypokalemia 02/12/2017  . Fatigue 02/12/2017  . Lung cancer ([place]) 02/12/2017  . Hypothyroidism 02/11/2017  . Malignancy ([place])   . Palliative care by specialist   . Goals of  care, counseling/discussion   . DNR (do not resuscitate) discussion   . Depressive disorder   . Dehydration 02/07/2017  . Weight loss 02/07/2017  . Encounter for antineoplastic chemotherapy 02/01/2017  . 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 Sepsis ([place]) 11/07/2016  . Protein-calorie malnutrition, severe 11/07/2016  . Acute respiratory distress 06/21/2016  . COPD exacerbation ([place]) 06/21/2016  . Sinus tachycardia 06/21/2016  . Recurrent aspiration bronchitis/pneumonia ([place]) 06/21/2016  . Dysphagia 06/21/2016  . Nausea and vomiting 06/21/2016  . Dental abscess 05/10/2016  . Swallowing difficulty 03/28/2016  . Hypomagnesemia 02/29/2016  . Hoarseness 02/18/2016  . Cancer related pain 07/08/2015  . Adenocarcinoma, lung ([place]) 03/26/2015  . 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 Ear ache 10/03/2014  . Abscess of buccal cavity 10/03/2014  . Lump in neck 10/03/2014  . Laryngeal pain 10/03/2014    Past Surgical History:  Procedure Laterality Date  . CESAREAN SECTION CLASSICAL    . STOMACH SURGERY     Pt reports for a tumor    Prior to Admission medications   Medication Sig Start Date End Date Taking? Authorizing Provider  ALPRAZolam (XANAX) 0.25 MG tablet Take 1 tablet (0.25 mg total) by mouth 3 (three) times daily as needed for anxiety. 04/18/17   Bettey Costa, MD  docusate sodium (COLACE) 100 MG capsule Take 100 mg by mouth 2 (two) times daily as needed for mild constipation.    [provider]  doxycycline (VIBRAMYCIN) 100 MG capsule Take 1 capsule (100 mg total) by mouth 2 (two) times daily. 04/28/17 05/05/17  Rudene Re, MD  feeding supplement (BOOST / RESOURCE BREEZE) LIQD Take 1 Container by mouth 2 (two) times daily between meals. 02/12/17   [name], MD  folic acid (FOLVITE) 948 MCG tablet Take 800 mcg by mouth daily.     [provider]  ipratropium-albuterol (DUONEB) 0.5-2.5 (3) MG/3ML SOLN Take 3 mLs by  nebulization every 4 (four) hours. 02/12/17   [name], MD  levothyroxine (SYNTHROID, LEVOTHROID) 100 MCG tablet Take 100 mcg by mouth daily before breakfast.  02/24/17   [provider]  lidocaine (XYLOCAINE) 2 % solution Use as directed 10 mLs in the mouth or throat every 4 (four) hours as needed for mouth pain.    [provider]  magnesium oxide (MAG-OX) 400 MG tablet Take 1 tablet (400 mg total) by mouth 2 (two) times daily. 03/04/17   [name], MD  megestrol (MEGACE) 40 MG/ML suspension Take 5 mLs (200 mg total) by mouth 2 (two) times daily. 04/24/17   Lequita Asal, MD  methimazole (TAPAZOLE) 5 MG tablet Take 1 tablet (5 mg total) by mouth daily. 03/19/17   [name], MD  metoprolol (LOPRESSOR) 50 MG tablet Take 0.5 tablets (25 mg total) by mouth 2 (two) times daily. 03/04/17   [name], MD  Morphine Sulfate (MORPHINE CONCENTRATE) 10 mg / 0.5 ml concentrated solution Take 0.5 mLs (10 mg total) by mouth every 4 (four) hours as needed for severe pain. 04/18/17   Bettey Costa, MD  omeprazole (PRILOSEC) 20 MG capsule Take 1 capsule (20 mg total) by mouth daily. 06/20/16   Lequita Asal, MD  ondansetron (ZOFRAN) 4 MG tablet Take 1 tablet (4 mg total) by mouth every 6 (six) hours as needed for nausea. 02/12/17   [name], MD  potassium chloride 20 MEQ TBCR Take 1 tab twice a day 03/04/17   [name], MD  predniSONE (DELTASONE) 20 MG tablet Take 3 tablets (60 mg total) by mouth daily. 04/28/17 05/02/17  Rudene Re, MD  thiamine (VITAMIN B-1) 100 MG tablet Take 1 tablet (100 mg total) by mouth daily. 02/21/17   [name], MD  tiotropium (SPIRIVA) 18 MCG inhalation capsule Place 1 capsule (18 mcg total) into inhaler and inhale daily. 06/22/16   [name], MD  traZODone (DESYREL) 50 MG tablet Take 1 tablet (50 mg total) by mouth at bedtime as needed for sleep. 04/24/17   Lequita Asal, MD    Allergies No known allergies  Family  History  Problem Relation Age of Onset  . Hypertension Father   . Cancer Father   . Diabetes Father   . Cancer Maternal Aunt   . Cancer Mother     Social History Social History  Substance Use Topics  . Smoking status: Current Every Day Smoker    Packs/day: 0.25    Years: 15.00    Types: Cigarettes  . Smokeless tobacco: Never Used  . Alcohol use No    Review of Systems  Constitutional: Negative for fever. Eyes: Negative for visual changes. ENT: Negative for sore throat. Neck: No neck pain  Cardiovascular: + chest pain. Respiratory: + shortness of breath. Gastrointestinal: Negative for abdominal pain, vomiting or diarrhea. Genitourinary: Negative for dysuria. Musculoskeletal: Negative for back pain. Skin: Negative for rash. Neurological: Negative for headaches, weakness or numbness. Psych: No SI or HI  ____________________________________________   PHYSICAL EXAM:  VITAL SIGNS: ED Triage Vitals  Enc Vitals Group     BP 04/29/17 0633 (!) 153/95     Pulse Rate  04/29/17 0633 (!) 118     Resp 04/29/17 0633 20     Temp 04/29/17 0633 98.2 F (36.8 C)     Temp Source 04/29/17 0633 Oral     SpO2 04/29/17 0633 100 %     Weight 04/29/17 0631 105 lb (47.6 kg)     Height 04/29/17 0631 '5\' 4"'$  (1.626 m)     Head Circumference --      Peak Flow --      Pain Score 04/29/17 0630 7     Pain Loc --      Pain Edu? --      Excl. in [place]? --     Constitutional: Cachectic, teary, no respiratory distress  HEENT:      Head: Normocephalic and atraumatic.         Eyes: Conjunctivae are normal. Sclera is non-icteric. EOMI. PERRL      Mouth/Throat: Mucous membranes are moist.       Neck: Supple with no signs of meningismus. Cardiovascular: Tachycardic with regular rhythm. No murmurs, gallops, or rubs. 2+ symmetrical distal pulses are present in all extremities. No JVD. Respiratory: Normal respiratory effort. Diminished air movement bilaterally, no wheezes or  crackles Gastrointestinal: Soft, non tender, and non distended with positive bowel sounds. No rebound or guarding. Genitourinary: No CVA tenderness. Musculoskeletal: Nontender with normal range of motion in all extremities. No edema, cyanosis, or erythema of extremities. Neurologic: Normal speech and language. Face is symmetric. Moving all extremities. No gross focal neurologic deficits are appreciated. Skin: Skin is warm, dry and intact. No rash noted. Psychiatric: Mood and affect are depressed. Speech and behavior are normal.  ____________________________________________   LABS (all labs ordered are listed, but only abnormal results are displayed)  Labs Reviewed  CBC WITH DIFFERENTIAL/PLATELET  TROPONIN I  BASIC METABOLIC PANEL   ____________________________________________  EKG  ED ECG REPORT I, Rudene Re, the attending physician, personally viewed and interpreted this ECG.  Sinus tachycardia, rate of 124, normal intervals, normal axis, no ST elevations or depressions. Unchanged from prior.  ____________________________________________  RADIOLOGY  CTA chest: declined ____________________________________________   PROCEDURES  Procedure(s) performed: None Procedures Critical Care performed:  None ____________________________________________   INITIAL IMPRESSION / ASSESSMENT AND PLAN / ED COURSE  56 y.o. female with a history of metastatic lung cancer on chemotherapy, COPD, hypertension, hyperthyroidism, failure to thrive who presents for evaluation of DOE and CP for the third time in 24 hours. Patient's EKG shows no ischemic changes. She does have diminished air movement bilaterally and will be treated with a DuoNeb. Patient has persistent tachycardia which per review of Epic is her baseline however since this is her third visit to the emergency room will pursue a CT of her chest to rule out other etiologies such as blood clot. I also believe that part of  patient's presentation is because she doesn't fully understand her diagnosis of cancer and prognosis. She lives with her daughter and tells me that she feels safe. She does not want to be placed anywhere else. We'll give her pain medication as well.  Clinical Course as of Apr 29 1342  Wed Apr 29, 2017  1340 Multiple attempts to place an IV failed by multiple nursing staff, myself, and IV team. I discussed placing a central line so we can do a CT, give fluids, check blood work. Patient is refusing central line. She tells that she feels markedly improved after 2 DuoNeb. She is eating lunch. She wishes to go home at this  time. I explained to her I'm not very concerned about repeating blood work since it has been done twice over the last 24 hours but I did want to get a CT of her chest to rule out a PE. She understands but does not wish a central line at this time and is requesting discharge. She will be discharged home to follow-up with her oncologist.  [CV]    Clinical Course User Index [CV] Rudene Re, MD    Pertinent labs & imaging results that were available during my care of the patient were reviewed by me and considered in my medical decision making (see chart for details).    ____________________________________________   FINAL CLINICAL IMPRESSION(S) / ED DIAGNOSES  Final diagnoses:  Shortness of breath  COPD exacerbation (HCC)      NEW MEDICATIONS STARTED DURING THIS VISIT:  New Prescriptions   No medications on file     Note:  This document was prepared using Dragon voice recognition software and may include unintentional dictation errors.    Alfred Levins, [name], MD 04/29/17 1344

## 2017-04-29 NOTE — ED Notes (Signed)
Patient ambulated to restroom with pulseox on. O2 maintained at 99%. HR elevated to 145 and RR elevated to 30 with exertion. MD made aware. Will continue to monitor.

## 2017-04-29 NOTE — Discharge Instructions (Signed)
We believe that your symptoms are caused today by an exacerbation of your COPD.  Please take the prescribed medications and any medications that you have at home for your COPD.  Follow up with your doctor as recommended.  If you develop any new or worsening symptoms, including but not limited to fever, persistent vomiting, worsening shortness of breath, or other symptoms that concern you, please return to the Emergency Department immediately.  Please take your home medications including those prescribed to you during your Emergency Department visit earlier today.

## 2017-04-29 NOTE — ED Triage Notes (Signed)
Pt in with co shob x 2 hrs with co mid back pain. Has been here for the same recently was told it is due to lung ca, pt is getting chemo for the same. Had chemo last week states gets weekly treatments.

## 2017-04-29 NOTE — H&P (Signed)
History and Physical   [facility] - [place] @ [facility] Admission History and Physical [employer], D.O.    Patient Name: Audrey Mcgee MR#: [id number] Date of Birth: 04-01-1961 Date of Admission: 04/29/2017  Referring MD/NP/PA: Dr. Alfred Levins Primary Care Physician: Donnie Coffin, MD Patient coming from: Home Outpatient Specialists: Dr. Mike Gip   Chief Complaint:  Chief Complaint  Patient presents with  . Shortness of Breath    HPI: Audrey Mcgee is a 56 y.o. female with a known history of lung cancer with mets, COPD, dementia, HTN, hyperthyroidism presents to the emergency department for evaluation of SOB and chest pain.  Patient is noted to have 3 emergency department visits in the last 24 hours with complaints of chest pain and shortness of breath. She states that developed central chest pressure which radiates between her shoulders into her back and is associated with rapid heart rate, anxiety and worsening shortness of breath. Symptoms are not relieved by her inhalers. Patient is short of breath and tachycardic at baseline.    In the emergency room and she is found to have an elevated troponin of 0.25 which is significantly elevated from her value which was less than 0.03 yesterday. Admission for observation and rule out of MI was requested.   Of note patient has had numerous ED visits for similar complaints over the past month including two hospitalizations.  Home hospice and palliative care have been consulted but the patient wishes to remain a full code and is refusing home hospice.    EMS/ED Course: Patient received aspirin, doxycycline, Duoneb x2, Ativan, Morphine, prednisone, NS.[name]  Review of Systems:  CONSTITUTIONAL: No fever/chills, fatigue, weakness, weight gain/loss, headache. EYES: No blurry or double vision. ENT: No tinnitus, postnasal drip, redness or soreness of the oropharynx. RESPIRATORY: Positive shortness of breath No cough,wheeze.  No hemoptysis.   CARDIOVASCULAR: Positive chest pain, palpitations, negative syncope, orthopnea. No lower extremity edema.  GASTROINTESTINAL: No nausea, vomiting, abdominal pain, diarrhea, constipation.  No hematemesis, melena or hematochezia. GENITOURINARY: No dysuria, frequency, hematuria. ENDOCRINE: No polyuria or nocturia. No heat or cold intolerance. HEMATOLOGY: No anemia, bruising, bleeding. INTEGUMENTARY: No rashes, ulcers, lesions. MUSCULOSKELETAL: No arthritis, gout, dyspnea. NEUROLOGIC: No numbness, tingling, ataxia, seizure-type activity, weakness. PSYCHIATRIC: Positive anxiety, depression, insomnia.   Past Medical History:  Diagnosis Date  . COPD (chronic obstructive pulmonary disease) ([place])   . Dementia   . Hypertension   . Lung cancer ([place])   . Thyroid disease     Past Surgical History:  Procedure Laterality Date  . CESAREAN SECTION CLASSICAL    . STOMACH SURGERY     Pt reports for a tumor     reports that she has been smoking Cigarettes.  She has a 3.75 pack-year smoking history. She has never used smokeless tobacco. She reports that she does not drink alcohol or use drugs.  Allergies  Allergen Reactions  . No Known Allergies    amily History   Medical History Relation Name Comments  Diabetes Cousin    Asthma Daughter    Hypertension Father    Asthma Grandchild    Hypertension Maternal Grandfather    Hypertension Mother    Diabetes Paternal Aunt    Hypertension Paternal Grandfather    Diabetes Paternal Grandmother    Lung cancer   Aunt - smoker      Prior to Admission medications   Medication Sig Start Date End Date Taking? Authorizing Provider  ALPRAZolam (XANAX) 0.25 MG tablet Take 1 tablet (0.25 mg total)  by mouth 3 (three) times daily as needed for anxiety. 04/18/17  Yes [name], [name], MD  docusate sodium (COLACE) 100 MG capsule Take 100 mg by mouth 2 (two) times daily as needed for mild constipation.   Yes [provider]  feeding  supplement (BOOST / RESOURCE BREEZE) LIQD Take 1 Container by mouth 2 (two) times daily between meals. 02/12/17  Yes [name], MD  folic acid (FOLVITE) 884 MCG tablet Take 800 mcg by mouth daily.    Yes [provider]  ipratropium-albuterol (DUONEB) 0.5-2.5 (3) MG/3ML SOLN Take 3 mLs by nebulization every 4 (four) hours. 02/12/17  Yes [name], MD  levothyroxine (SYNTHROID, LEVOTHROID) 100 MCG tablet Take 100 mcg by mouth daily before breakfast.  02/24/17  Yes [provider]  lidocaine (XYLOCAINE) 2 % solution Use as directed 10 mLs in the mouth or throat every 4 (four) hours as needed for mouth pain.   Yes [provider]  magnesium oxide (MAG-OX) 400 MG tablet Take 1 tablet (400 mg total) by mouth 2 (two) times daily. 03/04/17  Yes [name], MD  megestrol (MEGACE) 40 MG/ML suspension Take 5 mLs (200 mg total) by mouth 2 (two) times daily. 04/24/17  Yes [name], [name], MD  methimazole (TAPAZOLE) 5 MG tablet Take 1 tablet (5 mg total) by mouth daily. 03/19/17  Yes [name], MD  metoprolol (LOPRESSOR) 50 MG tablet Take 0.5 tablets (25 mg total) by mouth 2 (two) times daily. 03/04/17  Yes [name], MD  Morphine Sulfate (MORPHINE CONCENTRATE) 10 mg / 0.5 ml concentrated solution Take 0.5 mLs (10 mg total) by mouth every 4 (four) hours as needed for severe pain. 04/18/17  Yes [name], [name], MD  omeprazole (PRILOSEC) 20 MG capsule Take 1 capsule (20 mg total) by mouth daily. 06/20/16  Yes [name], [name], MD  ondansetron (ZOFRAN) 4 MG tablet Take 1 tablet (4 mg total) by mouth every 6 (six) hours as needed for nausea. 02/12/17  Yes [name], MD  potassium chloride 20 MEQ TBCR Take 1 tab twice a day 03/04/17  Yes [name], MD  predniSONE (DELTASONE) 20 MG tablet Take 3 tablets (60 mg total) by mouth daily. 04/28/17 05/02/17 Yes [name], [name], MD  thiamine (VITAMIN B-1) 100 MG tablet Take 1 tablet (100 mg total) by mouth daily. 02/21/17  Yes  [name], MD  tiotropium (SPIRIVA) 18 MCG inhalation capsule Place 1 capsule (18 mcg total) into inhaler and inhale daily. 06/22/16  Yes [name], MD  doxycycline (VIBRAMYCIN) 100 MG capsule Take 1 capsule (100 mg total) by mouth 2 (two) times daily. Patient not taking: Reported on 04/29/2017 04/28/17 05/05/17  Rudene Re, MD  traZODone (DESYREL) 50 MG tablet Take 1 tablet (50 mg total) by mouth at bedtime as needed for sleep. Patient not taking: Reported on 04/29/2017 04/24/17   Lequita Asal, MD    Physical Exam: Vitals:   04/29/17 1745 04/29/17 1827 04/29/17 1950 04/29/17 2030  BP:    (!) 149/89  Pulse: (!) 109 98 (!) 112 (!) 101  Resp: '20  14 17  '$ Temp:      TempSrc:      SpO2: 100% 100% 100% 100%  Weight:      Height:        GENERAL: 56 y.o.-year-old Cachectic black, female patient. Anxious. HEENT: Head atraumatic, normocephalic. Pupils equal, round, reactive to light and accommodation. Oropharynx is clear. Mucus membranes moist. NECK: Supple, full range of motion. CHEST: Normal breath  sounds bilaterally. No wheezing, rales, rhonchi or crackles. No use of accessory muscles of respiration.  No reproducible chest wall tenderness.  CARDIOVASCULAR: S1, S2 normal. No murmurs, rubs, or gallops. Cap refill <2 seconds. Pulses intact distally.  ABDOMEN: Soft, nondistended, nontender. No rebound, guarding, rigidity. Normoactive bowel sounds present in all four quadrants. No organomegaly or mass. EXTREMITIES: No pedal edema, cyanosis, or clubbing. No calf tenderness or Homan's sign.  NEUROLOGIC: The patient is alert and oriented x 3. Cranial nerves II through XII are grossly intact with no focal sensorimotor deficit. Muscle strength 5/5 in all extremities. Sensation intact. Gait not checked. SKIN: Warm, dry, and intact without obvious rash, lesion, or ulcer.    Labs on Admission:  CBC:  Recent Labs Lab 04/28/17 0554 04/28/17 2249 04/29/17 1756  WBC 6.9 6.1 8.3   NEUTROABS  --  4.8  --   HGB 13.7 13.1 10.3*  HCT 41.9 39.4 30.7*  MCV 86.8 84.6 84.9  PLT 243 275 209   Basic Metabolic Panel:  Recent Labs Lab 04/28/17 0625 04/28/17 2249 04/29/17 1756  NA 140 139 137  K 3.0* 4.0 4.2  CL 105 104 105  CO2 '25 22 24  '$ GLUCOSE 77 170* 133*  BUN 6 5* 6  CREATININE 0.66 0.65 0.61  CALCIUM 9.0 9.2 8.6*   GFR: Estimated Creatinine Clearance: 60.4 mL/min (by C-G formula based on SCr of 0.61 mg/dL). Liver Function Tests:  Recent Labs Lab 04/28/17 2249  AST 41  ALT 9*  ALKPHOS 82  BILITOT 1.6*  PROT 8.1  ALBUMIN 3.6   No results for input(s): LIPASE, AMYLASE in the last 168 hours. No results for input(s): AMMONIA in the last 168 hours. Coagulation Profile: No results for input(s): INR, PROTIME in the last 168 hours. Cardiac Enzymes:  Recent Labs Lab 04/28/17 0728 04/28/17 2249 04/29/17 1756  TROPONINI <0.03 <0.03 0.25*   BNP (last 3 results) No results for input(s): PROBNP in the last 8760 hours. HbA1C: No results for input(s): HGBA1C in the last 72 hours. CBG: No results for input(s): GLUCAP in the last 168 hours. Lipid Profile: No results for input(s): CHOL, HDL, LDLCALC, TRIG, CHOLHDL, LDLDIRECT in the last 72 hours. Thyroid Function Tests:  Recent Labs  04/28/17 0625  TSH <0.010*  FREET4 1.20*   Anemia Panel: No results for input(s): VITAMINB12, FOLATE, FERRITIN, TIBC, IRON, RETICCTPCT in the last 72 hours. Urine analysis:    Component Value Date/Time   COLORURINE AMBER (A) 03/13/2017 0034   APPEARANCEUR CLEAR (A) 03/13/2017 0034   APPEARANCEUR Clear 11/10/2013 0918   LABSPEC 1.034 (H) 03/13/2017 0034   LABSPEC 1.018 11/10/2013 0918   PHURINE 6.0 03/13/2017 0034   GLUCOSEU NEGATIVE 03/13/2017 0034   GLUCOSEU Negative 11/10/2013 0918   HGBUR NEGATIVE 03/13/2017 0034   BILIRUBINUR NEGATIVE 03/13/2017 0034   BILIRUBINUR Negative 11/10/2013 0918   KETONESUR NEGATIVE 03/13/2017 0034   PROTEINUR NEGATIVE  03/13/2017 0034   NITRITE NEGATIVE 03/13/2017 0034   LEUKOCYTESUR NEGATIVE 03/13/2017 0034   LEUKOCYTESUR Negative 11/10/2013 0918   Sepsis Labs: '@LABRCNTIP'$ (procalcitonin:4,lacticidven:4) ) Recent Results (from the past 240 hour(s))  Blood Culture (routine x 2)     Status: None   Collection Time: 04/20/17  9:57 PM  Result Value Ref Range Status   Specimen Description BLOOD LEFT HAND  Final   Special Requests   Final    BOTTLES DRAWN AEROBIC AND ANAEROBIC Blood Culture adequate volume   Culture NO GROWTH 5 DAYS  Final   Report Status 04/25/2017  FINAL  Final  Blood Culture (routine x 2)     Status: None   Collection Time: 04/20/17 10:00 PM  Result Value Ref Range Status   Specimen Description BLOOD LEFT FOREARM  Final   Special Requests   Final    BOTTLES DRAWN AEROBIC AND ANAEROBIC Blood Culture adequate volume   Culture NO GROWTH 5 DAYS  Final   Report Status 04/25/2017 FINAL  Final     Radiological Exams on Admission: Dg Chest 2 View  Result Date: 04/28/2017 CLINICAL DATA:  56 y/o  F; shortness of breath. EXAM: CHEST  2 VIEW COMPARISON:  04/22/2017 chest radiograph FINDINGS: Stable normal cardiac silhouette. Stable elevated left hemidiaphragm. No focal consolidation, effusion, or pneumothorax. Stable cavitary nodule projecting over the left fifth posterior rib. Bones are unremarkable. IMPRESSION: Stable left mid lung zone cavitary nodule. Elevated left hemidiaphragm. No new acute pulmonary process. Electronically Signed   By: Kristine Garbe M.D.   On: 04/28/2017 06:26   Ct Angio Chest Pe W Or Wo Contrast  Result Date: 04/29/2017 CLINICAL DATA:  Shortness of breath and mid back pain. EXAM: CT ANGIOGRAPHY CHEST WITH CONTRAST TECHNIQUE: Multidetector CT imaging of the chest was performed using the standard protocol during bolus administration of intravenous contrast. Multiplanar CT image reconstructions and MIPs were obtained to evaluate the vascular anatomy. CONTRAST:  75 mL  Isovue 370 COMPARISON:  Chest radiograph 04/28/2017 Chest CT 03/12/2017 FINDINGS: Cardiovascular: Contrast injection is sufficient to demonstrate satisfactory opacification of the pulmonary arteries to the segmental level. There is no pulmonary embolus. The main pulmonary artery is within normal limits for size. There is no CT evidence of acute right heart strain. Mild aortic atherosclerotic calcification. There is a normal 3-vessel arch branching pattern. Heart size is normal, without pericardial effusion. Mediastinum/Nodes: No mediastinal, hilar or axillary lymphadenopathy. The visualized thyroid and thoracic esophageal course are unremarkable. Lungs/Pleura: Decreased size of cavitary lesion in the left lower lobe, now measuring 9 x 5 mm, previously 22 x 26 mm. Multiple nodular opacities in the left upper lobe, unchanged. No lesions identified in the right lung. No pleural effusion or pneumothorax. Upper Abdomen: Contrast bolus timing is not optimized for evaluation of the abdominal organs. Within this limitation, the visualized organs of the upper abdomen are normal. Musculoskeletal: No chest wall abnormality. No acute or significant osseous findings. Other: Calcification containing soft tissue mass in the right breast, likely fibroadenoma. This is unchanged compared to 01/29/2017. Review of the MIP images confirms the above findings. IMPRESSION: 1. No pulmonary embolus. 2. Decreased size of cavitary lesion in the left lower lobe. This could represent malignancy with treatment effect following chemotherapy; however, a resolving septic embolus might have the same appearance. Areas of nodular ground-glass opacity in the left upper lobe are unchanged. 3. Aortic atherosclerosis. 4. Partially calcified right breast mass, likely fibroadenoma. Electronically Signed   By: Ulyses Jarred M.D.   On: 04/29/2017 18:53    EKG: Sinus tach at 119bpm with normal axis and nonspecific ST-T wave changes.    Assessment/Plan  This is a 56 y.o. female with a history of ung cancer with mets, COPD, dementia, HTN, hyperthyroidism  now being admitted with:  #. Shortness of breath with elevated troponin, rule out ACS - Admit to observation with telemetry monitoring. - Trend troponins, check lipids. - Morphine, nitro, beta blocker, aspirin and statin ordered.   - Will add one weight based dose of Lovenox - Consider cardiology consultation.  #. History of lung cancer - Continue  Megace - Continue anxiolytics, antiemetics and pain control - Continue Duonebs and Spiriva  #. History of hyperthyroidism - Will continue tapazole and levothyroxine for now. Will need monitoring and medication adjustment as outpatient  #. History of HTN, tachycardia - Continue metoprolol  #. History of GERD - Continue Protonix for Prilosec  Admission status: Observation, tele IV Fluids: HL Diet/Nutrition: Heart healthy Consults called: None  DVT Px: Lovenox, SCDs and early ambulation. Code Status: Full Code  Disposition Plan: To home in <24 hours  All the records are reviewed and case discussed with ED provider. Management plans discussed with the patient and/or family who express understanding and agree with plan of care.  Jaecion Dempster D.O. on 04/29/2017 at 8:45 PM Between 7am to 6pm - Pager - [phone] After 6pm go to [email or web address] - [employer] [facility] Office [phone] CC: Primary care physician; Donnie Coffin, MD   04/29/2017, 8:45 PM

## 2017-04-30 ENCOUNTER — Observation Stay (HOSPITAL_BASED_OUTPATIENT_CLINIC_OR_DEPARTMENT_OTHER)
Admit: 2017-04-30 | Discharge: 2017-04-30 | Disposition: A | Discharge status: Home / Self Care | Payer: Medicaid Other | Attending: Physician Assistant | Admitting: Physician Assistant

## 2017-04-30 DIAGNOSIS — R000 Tachycardia, unspecified: Secondary | ICD-10-CM

## 2017-04-30 DIAGNOSIS — F0391 Unspecified dementia with behavioral disturbance: Secondary | ICD-10-CM | POA: Diagnosis not present

## 2017-04-30 DIAGNOSIS — R0602 Shortness of breath: Secondary | ICD-10-CM

## 2017-04-30 DIAGNOSIS — C3490 Malignant neoplasm of unspecified part of unspecified bronchus or lung: Secondary | ICD-10-CM

## 2017-04-30 DIAGNOSIS — R079 Chest pain, unspecified: Secondary | ICD-10-CM

## 2017-04-30 DIAGNOSIS — R634 Abnormal weight loss: Secondary | ICD-10-CM

## 2017-04-30 DIAGNOSIS — G893 Neoplasm related pain (acute) (chronic): Secondary | ICD-10-CM | POA: Diagnosis not present

## 2017-04-30 DIAGNOSIS — R7989 Other specified abnormal findings of blood chemistry: Secondary | ICD-10-CM

## 2017-04-30 DIAGNOSIS — F03918 Unspecified dementia, unspecified severity, with other behavioral disturbance: Secondary | ICD-10-CM

## 2017-04-30 LAB — TROPONIN I
TROPONIN I: 0.25 ng/mL — AB (ref <0.03)
Troponin I: 0.25 ng/mL (ref <0.03)

## 2017-04-30 LAB — CBC
HCT: 29.4 % — ABNORMAL LOW (ref 35.0–47.0)
Hemoglobin: 9.8 g/dL — ABNORMAL LOW (ref 12.0–16.0)
MCH: 28.5 pg (ref 26.0–34.0)
MCHC: 33.4 g/dL (ref 32.0–36.0)
MCV: 85.3 fL (ref 80.0–100.0)
PLATELETS: 225 10*3/uL (ref 150–440)
RBC: 3.44 MIL/uL — ABNORMAL LOW (ref 3.80–5.20)
RDW: 15.4 % — ABNORMAL HIGH (ref 11.5–14.5)
WBC: 8.3 10*3/uL (ref 3.6–11.0)

## 2017-04-30 LAB — ECHOCARDIOGRAM COMPLETE
HEIGHTINCHES: 64 in
Weight: 1593.6 oz

## 2017-04-30 MED ORDER — IPRATROPIUM-ALBUTEROL 0.5-2.5 (3) MG/3ML IN SOLN
3.0000 mL | Freq: Three times a day (TID) | RESPIRATORY_TRACT | Status: DC
  Administered 2017-04-30: 3 mL via RESPIRATORY_TRACT
  Filled 2017-04-30: qty 3

## 2017-04-30 MED ORDER — IPRATROPIUM-ALBUTEROL 0.5-2.5 (3) MG/3ML IN SOLN: 3.0000 mL | RESPIRATORY_TRACT | Status: DC | PRN

## 2017-04-30 MED ORDER — ASPIRIN EC 81 MG PO TBEC
81.0000 mg | DELAYED_RELEASE_TABLET | Freq: Every day | ORAL | Status: DC
  Administered 2017-05-01 – 2017-05-08 (×8): 81 mg via ORAL
  Filled 2017-04-30 (×9): qty 1

## 2017-04-30 MED ORDER — OXYCODONE-ACETAMINOPHEN 5-325 MG PO TABS
1.0000 | ORAL_TABLET | Freq: Four times a day (QID) | ORAL | Status: DC | PRN
  Administered 2017-05-01 – 2017-05-08 (×14): 1 via ORAL
  Filled 2017-04-30 (×14): qty 1

## 2017-04-30 NOTE — Progress Notes (Signed)
Pt.'s respiratory assessment  Score is 7. She is not compliant with her nebulizer treatments and has no wheezes at this time. Nebulizer treatments are to be changed to TID.

## 2017-04-30 NOTE — Progress Notes (Signed)
Patient continously pulling off telemetry monitor. Cardio PA paged and agreed was ok to D/C tele. Primary MD notified and agreed to D/C tele as well. Notified CCMD and will D/C.
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
## 2017-04-30 NOTE — Progress Notes (Signed)
Patient is pleasantly confused, keeps pulling off telemetry and saying that she wants to go home or would like to go outside. Advised patient to keep telemetry on per MD orders and reoriented patient.

## 2017-04-30 NOTE — Plan of Care (Signed)
Problem: Safety: Goal: Ability to remain free from injury will improve Outcome: Progressing Bed alarm on, closest to nurses station, non skid socks in place, educated to call if pt needs to get up out of bed.  Problem: Pain Managment: Goal: General experience of comfort will improve Outcome: Progressing Pt complained of chest pain radiating to shoulder blades, treated with morphine concentrate with relief. Will continue to monitor.  Problem: Tissue Perfusion: Goal: Risk factors for ineffective tissue perfusion will decrease Outcome: Completed/Met Date Met: 04/30/17 lovenox for VTE
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
## 2017-04-30 NOTE — Progress Notes (Signed)
Patient insisting on leaving facility. Family in room and patient seems very agitated. Wanted to speak to MD. MD paged and spoke with patient. Patient advised she should not leave facility. Psych consult placed for evaluation of competency.
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
## 2017-04-30 NOTE — Progress Notes (Signed)
Pt is moving mouthpiece around head and laying the mouthpiece in bed. Offered pt the mask and she wore the mask for less than a minute. Pt began pulling the mask off as well. Pt is done with the treatment even though there is still medicine in the cup.

## 2017-04-30 NOTE — Progress Notes (Signed)
*  PRELIMINARY RESULTS* Echocardiogram 2D Echocardiogram has been performed.  Audrey Mcgee 04/30/2017, 2:38 PM

## 2017-04-30 NOTE — Progress Notes (Signed)
Initial Nutrition Assessment  DOCUMENTATION CODES:   Severe malnutrition in context of chronic illness  INTERVENTION:  1. CIB w/ Whole Milk TID, combined provides 280 calories and 13 gm protein 2. Boost Breeze po BID, each supplement provides 250 kcal and 9 grams of protein 3. Encourage PO intake, monitor 4. Monitor magnesium, potassium, and phosphorus daily for at least 3 days, MD to replete as needed, as pt is at risk for refeeding syndrome given severe malnutrition.  NUTRITION DIAGNOSIS:   Malnutrition (Severe) related to chronic illness (Stg IV Lung CA) as evidenced by severe depletion of muscle mass, severe depletion of body fat, percent weight loss.  GOAL:   Patient will meet greater than or equal to 90% of their needs  MONITOR:   I & O's, Labs, Weight trends, Supplement acceptance, PO intake  REASON FOR ASSESSMENT:   Malnutrition Screening Tool    ASSESSMENT:   Audrey Mcgee is a 56 y.o. female with a known history of lung cancer with mets, COPD, dementia, HTN, hyperthyroidism presents to the emergency department for evaluation of SOB and chest pain.  [name] is well known to our service Palliative, Hospice consulted but patient refused. Hospice at home was discontinued 2/2 to unsafe conditions.  Continues to lose weight, 14#/12.4% severe wt loss over 2 weeks. Claims she was eating well PTA up until 2-3 days PTA, and was not consuming anything.  Apparently, she continues to struggle to finish meals. Was eating eggs, grits , ham for breakfast, ham and cheese or fish for lunch with mac and cheese or french fries, and chicken.fish for dinner.  Also consumes boost/ensure usually  Doesn't appear to be nauseated upon this admission. Asking for pain medication during visit however. Non-compliant with nebulizer.  Nutrition-Focused physical exam completed. Findings are severe fat depletion, severe muscle depletion, and no edema.   Does not appear she will restart  chemotherapy at this time.  Labs and medications reviewed: Thiamine, Megace, Mag-Ox, Folic Acid, KCL  Diet Order:  Diet Heart Room service appropriate? Yes; Fluid consistency: Thin  Skin:  Reviewed, no issues  Last BM:  04/27/2017  Height:   Ht Readings from Last 1 Encounters:  04/29/17 5' 4"  (1.626 m)    Weight:   Wt Readings from Last 1 Encounters:  04/29/17 99 lb 9.6 oz (45.2 kg)    Ideal Body Weight:  54.54 kg  BMI:  Body mass index is 17.1 kg/m.  Estimated Nutritional Needs:   Kcal:  1500-1750 calories  Protein:  68-79 gm  Fluid:  >/= 1.5L  EDUCATION NEEDS:   No education needs identified at this time  Audrey Mcgee. Audrey Bruna, MS, RD LDN Inpatient Clinical Dietitian Pager [phone]

## 2017-04-30 NOTE — Plan of Care (Signed)
Problem: Safety: Goal: Ability to remain free from injury will improve Outcome: Not Progressing Patient insisting on leaving, code [phone] called to keep patient around until psychiatry could come and evaluate patient.

## 2017-04-30 NOTE — Progress Notes (Signed)
Patient being trasnferred to room 101. Report given to Jerral Bonito and daughter, [name] notified of patient's transfer. Earleen Reaper, RN

## 2017-04-30 NOTE — Consult Note (Signed)
[facility] Face-to-Face Psychiatry Consult   Reason for Consult:  Consult for 56 year old woman with metastatic cancer and multiple medical problems. Question of capacity Referring Physician:  Bridgett Larsson Patient Identification: Audrey Mcgee MRN:  [id number] Principal Diagnosis: Dementia with behavioral disturbance Diagnosis:   Patient Active Problem List   Diagnosis Date Noted  . Chest pain [R07.9] 04/30/2017  . Dementia with behavioral disturbance [F03.91] 04/30/2017  . Elevated troponin [R74.8] 04/29/2017  . Failure to thrive (0-17) [R62.51] 04/20/2017  . Adjustment disorder with mixed anxiety and depressed mood [F43.23] 04/16/2017  . SOB (shortness of breath) [R06.02]   . Protein-calorie malnutrition ([place]) [E46]   . HCAP (healthcare-associated pneumonia) [J18.9] 03/12/2017  . Hyperthyroidism [E05.90] 03/02/2017  . COPD (chronic obstructive pulmonary disease) ([place]) [J44.9] 03/02/2017  . Subacute delirium [F05] 02/18/2017  . Altered mental status [R41.82] 02/12/2017  . Cannabis abuse [F12.10] 02/12/2017  . Nausea [R11.0] 02/12/2017  . Failure to thrive in adult [R62.7] 02/12/2017  . Hypokalemia [E87.6] 02/12/2017  . Fatigue [R53.83] 02/12/2017  . Metastatic lung cancer (metastasis from lung to other site) ([place]) [C34.90] 02/12/2017  . Hypothyroidism [E03.9] 02/11/2017  . Malignancy ([place]) [C80.1]   . Palliative care by specialist [Z51.5]   . Goals of care, counseling/discussion [Z71.89]   . DNR (do not resuscitate) discussion [Z71.89]   . Depressive disorder [F32.9]   . Dehydration [E86.0] 02/07/2017  . Weight loss [R63.4] 02/07/2017  . Encounter for antineoplastic chemotherapy [Z51.11] 02/01/2017  . 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 Sepsis ([place]) [A41.9] 11/07/2016  . Protein-calorie malnutrition, severe [E43] 11/07/2016  . Acute respiratory distress [R06.03] 06/21/2016  . COPD exacerbation ([place]) [J44.1] 06/21/2016  . Sinus tachycardia [R00.0] 06/21/2016   . Recurrent aspiration bronchitis/pneumonia ([place]) [J69.0] 06/21/2016  . Dysphagia [R13.10] 06/21/2016  . Nausea and vomiting [R11.2] 06/21/2016  . Dental abscess [K04.7] 05/10/2016  . Swallowing difficulty [R13.10] 03/28/2016  . Hypomagnesemia [E83.42] 02/29/2016  . Hoarseness [R49.0] 02/18/2016  . Cancer related pain [G89.3] 07/08/2015  . Adenocarcinoma, lung ([place]) [C34.90] 03/26/2015  . 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 Ear ache [H92.09] 10/03/2014  . Abscess of buccal cavity [K12.2] 10/03/2014  . Lump in neck [R22.1] 10/03/2014  . Laryngeal pain [R07.0] 10/03/2014    Total Time spent with patient: 1 hour  Subjective:   Audrey Mcgee is a 56 y.o. female patient admitted with "I just want to go home".  HPI:  Patient interviewed. Chart reviewed. Spoke with nursing and care management. Patient known to me from previous encounters. This is a 56 year old woman with multiple medical problems who has metastatic cancer. Treatment team is recommending placement for her because of her advanced medical needs and the family's inability to keep up with her safety at this point. Patient has not been consistently agreeable. When I came to see the patient this afternoon it was just at the moment that a behavioral security emergency had been called on the patient. She had gotten up out of her room and was insisting on walking around the hallways of the unit despite nurses best attempts to steer her back to her room. Patient was disoriented. She thought that she was walking outdoors for a "breath of fresh air". She seemed to only partially understand her current situation. Her understanding waxes and wanes. At one moment she will appear to understand that she is in the hospital and what the recommended treatment is and then immediately afterwards will contradict this and seemed completely confused. Patient on  one level does understand that she has advanced cancer  and is dying. She also understands that she is unsteady on her feet, her health is getting worse, and that she needs a lot of assistance. She even told me at one point that she understood that she needed to be placed in a nursing home and thought that it would be a good idea. Unfortunately, she is not able to hold onto this thought well enough to consistently agree. In the very next breath she will tell me that she is going to go home right this minute to her daughter's house as soon as her daughter arrives to pick her up. Patient is not suicidal, not psychotic, certainly not intending any harm or dangerousness. Her mood is dysphoric in a way that is appropriate to her situation. Does not appear to be hopeless.  Social history: Patient has 3 daughters I believe, and has been staying at the home of one of them. From what I gather from case management and from the chart however the daughter and her family are finding themselves unable to keep up with the patient's behavior. It's reported that the patient has been wandering out of the home at night and doing other things that put her at risk which the family can't keep up with.  Medical history: Multiple medical problems including the obvious metastatic cancer, hyperthyroidism  Substance abuse history: None  Past Psychiatric History: I seen this patient a couple times previously over the last several months. Her mental state seems to be declining to me from what I've seen. No other past history of any psychiatric treatment outside of the dementia. No history of suicidality or psychiatric hospitalization.  Risk to Self: Is patient at risk for suicide?: No Risk to Others:   Prior Inpatient Therapy:   Prior Outpatient Therapy:    Past Medical History:  Past Medical History:  Diagnosis Date  . COPD (chronic obstructive pulmonary disease) ([place])   . Dementia   . Hypertension   . Lung cancer ([place])   . Thyroid disease     Past Surgical History:   Procedure Laterality Date  . CESAREAN SECTION CLASSICAL    . STOMACH SURGERY     Pt reports for a tumor   Family History:  Family History  Problem Relation Age of Onset  . Hypertension Father   . Cancer Father   . Diabetes Father   . Cancer Maternal Aunt   . Cancer Mother    Family Psychiatric  History: None identified Social History:  History  Alcohol Use No     History  Drug Use No    Social History   Social History  . Marital status: Single    Spouse name: N/A  . Number of children: N/A  . Years of education: N/A   Occupational History  . disabled    Social History Main Topics  . Smoking status: Current Every Day Smoker    Packs/day: 0.25    Years: 15.00    Types: Cigarettes  . Smokeless tobacco: Never Used  . Alcohol use No  . Drug use: No  . Sexual activity: No   Other Topics Concern  . None   Social History Narrative  . None   Additional Social History:    Allergies:   Allergies  Allergen Reactions  . No Known Allergies     Labs:  Results for orders placed or performed during the hospital encounter of 04/29/17 (from the past 48 hour(s))  CBC  Status: Abnormal   Collection Time: 04/29/17  5:56 PM  Result Value Ref Range   WBC 8.3 3.6 - 11.0 K/uL   RBC 3.62 (L) 3.80 - 5.20 MIL/uL   Hemoglobin 10.3 (L) 12.0 - 16.0 g/dL   HCT 30.7 (L) 35.0 - 47.0 %   MCV 84.9 80.0 - 100.0 fL   MCH 28.4 26.0 - 34.0 pg   MCHC 33.5 32.0 - 36.0 g/dL   RDW 15.7 (H) 11.5 - 14.5 %   Platelets 198 150 - 440 K/uL  Basic metabolic panel     Status: Abnormal   Collection Time: 04/29/17  5:56 PM  Result Value Ref Range   Sodium 137 135 - 145 mmol/L   Potassium 4.2 3.5 - 5.1 mmol/L   Chloride 105 101 - 111 mmol/L   CO2 24 22 - 32 mmol/L   Glucose, Bld 133 (H) 65 - 99 mg/dL   BUN 6 6 - 20 mg/dL   Creatinine, Ser 0.61 0.44 - 1.00 mg/dL   Calcium 8.6 (L) 8.9 - 10.3 mg/dL   GFR calc non Af Amer >60 >60 mL/min   GFR calc Af Amer >60 >60 mL/min    Comment:  (NOTE) The eGFR has been calculated using the CKD EPI equation. This calculation has not been validated in all clinical situations. eGFR's persistently <60 mL/min signify possible Chronic Kidney Disease.    Anion gap 8 5 - 15  Troponin I     Status: Abnormal   Collection Time: 04/29/17  5:56 PM  Result Value Ref Range   Troponin I 0.25 (HH) <0.03 ng/mL    Comment: CRITICAL RESULT CALLED TO, READ BACK BY AND VERIFIED WITH LAURA CATES ON 04/29/2017 AT 1842 JJB   Troponin I-serum (0, 3, 6 hours)     Status: Abnormal   Collection Time: 04/29/17 10:06 PM  Result Value Ref Range   Troponin I 0.25 (HH) <0.03 ng/mL    Comment: CRITICAL VALUE NOTED. VALUE IS CONSISTENT WITH PREVIOUSLY REPORTED/CALLED VALUE BY [facility]   Troponin I-serum (0, 3, 6 hours)     Status: Abnormal   Collection Time: 04/30/17  1:02 AM  Result Value Ref Range   Troponin I 0.25 (HH) <0.03 ng/mL    Comment: CRITICAL VALUE NOTED. VALUE IS CONSISTENT WITH PREVIOUSLY REPORTED/CALLED VALUE / [place]  CBC     Status: Abnormal   Collection Time: 04/30/17  4:06 AM  Result Value Ref Range   WBC 8.3 3.6 - 11.0 K/uL   RBC 3.44 (L) 3.80 - 5.20 MIL/uL   Hemoglobin 9.8 (L) 12.0 - 16.0 g/dL   HCT 29.4 (L) 35.0 - 47.0 %   MCV 85.3 80.0 - 100.0 fL   MCH 28.5 26.0 - 34.0 pg   MCHC 33.4 32.0 - 36.0 g/dL   RDW 15.4 (H) 11.5 - 14.5 %   Platelets 225 150 - 440 K/uL  Troponin I-serum (0, 3, 6 hours)     Status: Abnormal   Collection Time: 04/30/17  4:06 AM  Result Value Ref Range   Troponin I 0.25 (HH) <0.03 ng/mL    Comment: CRITICAL VALUE NOTED. VALUE IS CONSISTENT WITH PREVIOUSLY REPORTED/CALLED VALUE...[facility]    Current Facility-Administered Medications  Medication Dose Route Frequency Provider Last Rate Last Dose  . acetaminophen (TYLENOL) tablet 650 mg  650 mg Oral Q4H PRN Hugelmeyer, Alexis, DO      . ALPRAZolam Duanne Moron) tablet 0.25 mg  0.25 mg Oral TID PRN Hugelmeyer, Alexis, DO      . [  START ON 05/01/2017] aspirin EC tablet 81 mg  81  mg Oral Daily Dunn, Ryan M, PA-C      . atorvastatin (LIPITOR) tablet 80 mg  80 mg Oral q1800 Hugelmeyer, Alexis, DO   80 mg at 04/29/17 2343  . enoxaparin (LOVENOX) injection 50 mg  50 mg Subcutaneous Q12H Hugelmeyer, Alexis, DO   50 mg at 04/30/17 1124  . feeding supplement (BOOST / RESOURCE BREEZE) liquid 1 Container  1 Container Oral BID BM Hugelmeyer, Alexis, DO   1 Container at 04/30/17 1519  . folic acid (FOLVITE) tablet 1 mg  1 mg Oral Daily Hugelmeyer, Alexis, DO   1 mg at 04/30/17 1124  . gi cocktail (Maalox,Lidocaine,Donnatal)  30 mL Oral QID PRN Hugelmeyer, Alexis, DO      . ipratropium-albuterol (DUONEB) 0.5-2.5 (3) MG/3ML nebulizer solution 3 mL  3 mL Nebulization Q4H PRN Demetrios Loll, MD      . levothyroxine (SYNTHROID, LEVOTHROID) tablet 50 mcg  50 mcg Oral QAC breakfast Hugelmeyer, Alexis, DO      . lidocaine (XYLOCAINE) 2 % viscous mouth solution 10 mL  10 mL Mouth/Throat Q4H PRN Hugelmeyer, Alexis, DO      . magnesium oxide (MAG-OX) tablet 400 mg  400 mg Oral BID Hugelmeyer, Alexis, DO   400 mg at 04/30/17 1124  . megestrol (MEGACE) 40 MG/ML suspension 200 mg  200 mg Oral BID Hugelmeyer, Alexis, DO   200 mg at 04/30/17 1123  . methimazole (TAPAZOLE) tablet 5 mg  5 mg Oral Daily Hugelmeyer, Alexis, DO   5 mg at 04/30/17 1125  . metoprolol tartrate (LOPRESSOR) tablet 25 mg  25 mg Oral BID Hugelmeyer, Alexis, DO   25 mg at 04/30/17 1124  . morphine CONCENTRATE 10 MG/0.5ML oral solution 10 mg  10 mg Oral Q4H PRN Hugelmeyer, Alexis, DO   10 mg at 04/30/17 1122  . ondansetron (ZOFRAN) injection 4 mg  4 mg Intravenous Q6H PRN Hugelmeyer, Alexis, DO      . ondansetron (ZOFRAN) tablet 4 mg  4 mg Oral Q6H PRN Hugelmeyer, Alexis, DO      . oxyCODONE-acetaminophen (PERCOCET/ROXICET) 5-325 MG per tablet 1 tablet  1 tablet Oral Q6H PRN Demetrios Loll, MD      . pantoprazole (PROTONIX) EC tablet 40 mg  40 mg Oral Daily Hugelmeyer, Alexis, DO   40 mg at 04/30/17 1125  . potassium chloride SA  (K-DUR,KLOR-CON) CR tablet 20 mEq  20 mEq Oral BID Hugelmeyer, Alexis, DO   20 mEq at 04/30/17 1124  . predniSONE (DELTASONE) tablet 60 mg  60 mg Oral Daily Hugelmeyer, Alexis, DO   60 mg at 04/30/17 1123  . thiamine (VITAMIN B-1) tablet 100 mg  100 mg Oral Daily Hugelmeyer, Alexis, DO   100 mg at 04/30/17 1124  . tiotropium (SPIRIVA) inhalation capsule 18 mcg  18 mcg Inhalation Daily Hugelmeyer, Alexis, DO   18 mcg at 04/30/17 1521  . zolpidem (AMBIEN) tablet 5 mg  5 mg Oral QHS PRN Hugelmeyer, Alexis, DO   5 mg at 04/30/17 0052    Musculoskeletal: Strength & Muscle Tone: decreased Gait & Station: unsteady Patient leans: Front  Psychiatric Specialty Exam: Physical Exam  Nursing note and vitals reviewed. Constitutional: She appears well-developed.  HENT:  Head: Normocephalic and atraumatic.  Eyes: Conjunctivae are normal. Pupils are equal, round, and reactive to light.  Neck: Normal range of motion.  Cardiovascular: Normal heart sounds.   Respiratory: Effort normal.  GI: Soft.  Musculoskeletal: Normal range of  motion.  Neurological: She is alert. She exhibits abnormal muscle tone. Coordination abnormal.  Skin: Skin is warm and dry.  Psychiatric: Her affect is blunt. Her speech is delayed and tangential. She is slowed. Thought content is not paranoid. Cognition and memory are impaired. She expresses impulsivity and inappropriate judgment. She expresses no homicidal and no suicidal ideation. She exhibits abnormal recent memory and abnormal remote memory.    Review of Systems  Constitutional: Positive for weight loss.  Musculoskeletal: Positive for back pain.  Neurological: Positive for weakness.  Psychiatric/Behavioral: Positive for memory loss. Negative for depression, hallucinations, substance abuse and suicidal ideas. The patient is nervous/anxious.     Blood pressure 137/90, pulse 91, temperature 98.3 F (36.8 C), temperature source Oral, resp. rate 16, height 5' 4"  (1.626 m),  weight 45.2 kg (99 lb 9.6 oz), last menstrual period 01/30/2008, SpO2 100 %.Body mass index is 17.1 kg/m.  General Appearance: Disheveled  Eye Contact:  Fair  Speech:  Slow  Volume:  Decreased  Mood:  Euthymic  Affect:  Appropriate  Thought Process:  Disorganized  Orientation:  Other:  As I mentioned her orientation is partial at best. Not consistent.  Thought Content:  Illogical, Rumination and Tangential  Suicidal Thoughts:  No  Homicidal Thoughts:  No  Memory:  Immediate;   Fair Recent;   Poor Remote;   Fair  Judgement:  Impaired  Insight:  Shallow  Psychomotor Activity:  Decreased  Concentration:  Concentration: Poor  Recall:  [employer] of Knowledge:  Fair  Language:  Fair  Akathisia:  No  Handed:  Right  AIMS (if indicated):     Assets:  Desire for Improvement Social Support  ADL's:  Impaired  Cognition:  Impaired,  Mild and Moderate  Sleep:        Treatment Plan Summary: Plan This is a sad situation. On the one hand the patient does understand the severity of her disease and is able at times to articulate a rational understanding of the reason to go into a nursing facility. Unfortunately, her dementia and confusion are such that she can't consistently hold on to that thought. Clearly it is very important to her to be making her own decisions. We had a talk about that for several minutes and about how cancer has made her feel helpless. At this point I would have to say that the patient really does not have capacity to make decisions because of her inability to hold onto a consistent understanding and reasoning about her condition. I think the ideal situation would be for her family to gently but clearly conveyed to her that they were unable to safely take care of her at home and to try to let the patient "decide" to go to the appropriate level of treatment and care. If that can't be done however, I would support the idea that the family should be pursuing guardianship as  there will probably be more difficult decisions to be made coming up. No indication for any psychiatric medication or other treatment. I will follow-up for supportive therapy and assessment while she is in the hospital.  Disposition: Patient does not meet criteria for psychiatric inpatient admission. Supportive therapy provided about ongoing stressors.  Alethia Berthold, MD 04/30/2017 6:28 PM

## 2017-04-30 NOTE — Progress Notes (Addendum)
[facility] [place] NAME: Audrey Mcgee    MR#:  [id number]  DATE OF BIRTH:  [birth year]  SUBJECTIVE:  CHIEF COMPLAINT:   Chief Complaint  Patient presents with  . Shortness of Breath   Mild chest pain on-and-off. No shortness of breath or cough. REVIEW OF SYSTEMS:  Review of Systems  Constitutional: Negative for chills, fever and malaise/fatigue.  HENT: Negative for congestion and sore throat.   Eyes: Negative for blurred vision and double vision.  Respiratory: Negative for cough, hemoptysis, shortness of breath and stridor.   Cardiovascular: Positive for chest pain. Negative for palpitations and leg swelling.  Gastrointestinal: Negative for abdominal pain, blood in stool, diarrhea, melena, nausea and vomiting.  Genitourinary: Negative for dysuria and hematuria.  Musculoskeletal: Negative for back pain.  Neurological: Negative for dizziness, focal weakness, loss of consciousness, weakness and headaches.  Psychiatric/Behavioral: Negative for depression. The patient is not nervous/anxious.     DRUG ALLERGIES:   Allergies  Allergen Reactions  . No Known Allergies    VITALS:  Blood pressure 137/90, pulse 91, temperature 98.3 F (36.8 C), temperature source Oral, resp. rate 16, height '5\' 4"'$  (1.626 m), weight 99 lb 9.6 oz (45.2 kg), last menstrual period 01/30/2008, SpO2 100 %. PHYSICAL EXAMINATION:  Physical Exam  Constitutional: She is oriented to person, place, and time.  Malnutrition.  HENT:  Head: Normocephalic.  Mouth/Throat: Oropharynx is clear and moist.  Eyes: Conjunctivae and EOM are normal.  Neck: Normal range of motion. Neck supple. No JVD present. No tracheal deviation present.  Cardiovascular: Normal rate, regular rhythm and normal heart sounds.  Exam reveals no gallop.   No murmur heard. Pulmonary/Chest: Effort normal and breath sounds normal. No respiratory distress.  Abdominal: Soft. Bowel sounds are normal.  She exhibits no distension. There is no tenderness.  Musculoskeletal: Normal range of motion. She exhibits no edema or tenderness.  Neurological: She is alert and oriented to person, place, and time. No cranial nerve deficit.  Skin: No rash noted. No erythema.  Psychiatric: Affect normal.   LABORATORY PANEL:  Female CBC  Recent Labs Lab 04/30/17 0406  WBC 8.3  HGB 9.8*  HCT 29.4*  PLT 225   ------------------------------------------------------------------------------------------------------------------ Chemistries   Recent Labs Lab 04/28/17 2249 04/29/17 1756  NA 139 137  K 4.0 4.2  CL 104 105  CO2 22 24  GLUCOSE 170* 133*  BUN 5* 6  CREATININE 0.65 0.61  CALCIUM 9.2 8.6*  AST 41  --   ALT 9*  --   ALKPHOS 82  --   BILITOT 1.6*  --    RADIOLOGY:  Ct Angio Chest Pe W Or Wo Contrast  Result Date: 04/29/2017 CLINICAL DATA:  Shortness of breath and mid back pain. EXAM: CT ANGIOGRAPHY CHEST WITH CONTRAST TECHNIQUE: Multidetector CT imaging of the chest was performed using the standard protocol during bolus administration of intravenous contrast. Multiplanar CT image reconstructions and MIPs were obtained to evaluate the vascular anatomy. CONTRAST:  75 mL Isovue 370 COMPARISON:  Chest radiograph 04/28/2017 Chest CT 03/12/2017 FINDINGS: Cardiovascular: Contrast injection is sufficient to demonstrate satisfactory opacification of the pulmonary arteries to the segmental level. There is no pulmonary embolus. The main pulmonary artery is within normal limits for size. There is no CT evidence of acute right heart strain. Mild aortic atherosclerotic calcification. There is a normal 3-vessel arch branching pattern. Heart size is normal, without pericardial effusion. Mediastinum/Nodes: No mediastinal, hilar or axillary lymphadenopathy. The visualized  thyroid and thoracic esophageal course are unremarkable. Lungs/Pleura: Decreased size of cavitary lesion in the left lower lobe, now measuring 9  x 5 mm, previously 22 x 26 mm. Multiple nodular opacities in the left upper lobe, unchanged. No lesions identified in the right lung. No pleural effusion or pneumothorax. Upper Abdomen: Contrast bolus timing is not optimized for evaluation of the abdominal organs. Within this limitation, the visualized organs of the upper abdomen are normal. Musculoskeletal: No chest wall abnormality. No acute or significant osseous findings. Other: Calcification containing soft tissue mass in the right breast, likely fibroadenoma. This is unchanged compared to 01/29/2017. Review of the MIP images confirms the above findings. IMPRESSION: 1. No pulmonary embolus. 2. Decreased size of cavitary lesion in the left lower lobe. This could represent malignancy with treatment effect following chemotherapy; however, a resolving septic embolus might have the same appearance. Areas of nodular ground-glass opacity in the left upper lobe are unchanged. 3. Aortic atherosclerosis. 4. Partially calcified right breast mass, likely fibroadenoma. Electronically Signed   By: Ulyses Jarred M.D.   On: 04/29/2017 18:53   ASSESSMENT AND PLAN:   This is a 56 y.o. female with a history of ung cancer with mets, COPD, dementia, HTN, hyperthyroidism  now being admitted with:  #. Chest pain, Shortness of breath with elevated troponin, no ACS or PE. Continue aspirin. Given her diffuse metastases, symptoms are likely noncardiac in nature, no further significant cardiac workup needed per Dr. Rockey Situ. Echo is pending. CT of the chest with angiogram showed No pulmonary embolus.  Tachycardia, sinus Continue Lopressor 25 mg twice a day.  #. History of lung cancer - Continue Megace - Continue anxiolytics, antiemetics and pain control - Continue Duonebs and Spiriva  #. History of hyperthyroidism - Will continue tapazole and levothyroxine for now. Will need monitoring and medication adjustment as outpatient  #. History of HTN, tachycardia -  Continue metoprolol  #. History of GERD - Continue Protonix for Prilosec  Malnutrition. Continue Megace.  Discussed with cardiology PA. All the records are reviewed and case discussed with Care Management/Social Worker. Management plans discussed with the patient, family and they are in agreement.  CODE STATUS: Full Code  TOTAL TIME TAKING CARE OF THIS PATIENT:33 minutes.   More than 50% of the time was spent in counseling/coordination of care: YES  POSSIBLE D/C IN 1 DAYS, DEPENDING ON CLINICAL CONDITION.   Demetrios Loll M.D on 04/30/2017 at 3:30 PM  Between 7am to 6pm - Pager - [phone]  After 6pm go to [email or web address] - Technical [employer]wer [facility]  Office  [phone]  CC: Primary care physician; Donnie Coffin, MD  Note: This dictation was prepared with Dragon dictation along with smaller phrase technology. Any transcriptional errors that result from this process are unintentional.

## 2017-05-01 MED ORDER — ENOXAPARIN SODIUM 40 MG/0.4ML ~~LOC~~ SOLN
40.0000 mg | SUBCUTANEOUS | Status: DC
  Administered 2017-05-01 – 2017-05-07 (×7): 40 mg via SUBCUTANEOUS
  Filled 2017-05-01 (×6): qty 0.4

## 2017-05-01 MED ORDER — ASPIRIN 81 MG PO TBEC: 81.0000 mg | DELAYED_RELEASE_TABLET | Freq: Every day | ORAL | 2 refills | Status: DC

## 2017-05-01 MED ORDER — METOPROLOL TARTRATE 25 MG PO TABS
25.0000 mg | ORAL_TABLET | Freq: Once | ORAL | Status: AC
  Administered 2017-05-01: 18:00:00 25 mg via ORAL
  Filled 2017-05-01: qty 1

## 2017-05-01 NOTE — Plan of Care (Signed)
Problem: Education: Goal: Knowledge of [place] [facility] Education information/materials will improve Outcome: Progressing Pt oriented to unit, including bed alarm, call bell, FPP.  VSS, free of falls during shift.  Reports chronic back pain 7-8/10, improved w/ PRN PO Roxanol '10mg'$ .   Pt impulsive upon arrival to unit, ignored staff reminders to stay in bed and call out before ambulating to prevent falls.  Received PRN PO Ambien '5mg'$  x1, PRN PO Xanax 0.'25mg'$  x1 for sleep/anxiety.  Pt more compliant toward end of shift, used call bell appropriately.  No other needs overnight.  A/O x3, disoriented to time, but significant memory impairment.  Bed in low position, bed alarm on.  BSC at bedside, call bell within reach.  WCTM.

## 2017-05-01 NOTE — Clinical Social Work Note (Signed)
CSW consulted for "Deemed incompetent by [facility]; guardianship? placement?" CSW has attempted to reach out to pt's daughter, [name], who would be the primary care giver. Pt does not have a safe discharge plan. Per psychiatrist, pt lacks capacity and is in need In order for pt to have hospice services, pt would need to be in a safe environment. Pt was discharged from home hospice services on April 06, 2017 due to an unsafe environment for Hospice staff. Pt's daughter is unable to provide the needed care, and would need for pt to be placed. Pt's daughter is agreeable to a SNF search. CSW will make referral to the Hospice agency that would serve the facility. CSW has also reached out to [place] to initiated a guardianship assessment. Pt's daughter was appreciative of this. CSW updated MD of above and bedside RN. CSW will continue to follow.   Darden Dates, MSW, LCSW  Clinical Social Worker  [phone]

## 2017-05-01 NOTE — NC FL2 (Signed)
[place] LEVEL OF CARE SCREENING TOOL     IDENTIFICATION  Patient Name: Audrey Mcgee Birthdate: [birth year] Sex: female Admission Date (Current Location): 04/29/2017  [place] and [place] Number:  Engineering geologist and Address:  [facility], [address], [place], [place] [address]      Provider Number: [id number]  Attending Physician Name and Address:  Demetrios Loll, MD  Relative Name and Phone Number:       Current Level of Care: Hospital Recommended Level of Care: [place] Prior Approval Number:    Date Approved/Denied:   PASRR Number: [id number]  Discharge Plan: SNF    Current Diagnoses: Patient Active Problem List   Diagnosis Date Noted  . Chest pain 04/30/2017  . Dementia with behavioral disturbance 04/30/2017  . Elevated troponin 04/29/2017  . Failure to thrive (0-17) 04/20/2017  . Adjustment disorder with mixed anxiety and depressed mood 04/16/2017  . SOB (shortness of breath)   . Protein-calorie malnutrition ([place])   . HCAP (healthcare-associated pneumonia) 03/12/2017  . Hyperthyroidism 03/02/2017  . COPD (chronic obstructive pulmonary disease) ([place]) 03/02/2017  . Subacute delirium 02/18/2017  . Altered mental status 02/12/2017  . Cannabis abuse 02/12/2017  . Nausea 02/12/2017  . Failure to thrive in adult 02/12/2017  . Hypokalemia 02/12/2017  . Fatigue 02/12/2017  . Metastatic lung cancer (metastasis from lung to other site) ([place]) 02/12/2017  . Hypothyroidism 02/11/2017  . Malignancy ([place])   . Palliative care by specialist   . Goals of care, counseling/discussion   . DNR (do not resuscitate) discussion   . Depressive disorder   . Dehydration 02/07/2017  . Weight loss 02/07/2017  . Encounter for antineoplastic chemotherapy 02/01/2017  . 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 Sepsis ([place]) 11/07/2016  . Protein-calorie malnutrition, severe 11/07/2016  . Acute respiratory  distress 06/21/2016  . COPD exacerbation ([place]) 06/21/2016  . Sinus tachycardia 06/21/2016  . Recurrent aspiration bronchitis/pneumonia ([place]) 06/21/2016  . Dysphagia 06/21/2016  . Nausea and vomiting 06/21/2016  . Dental abscess 05/10/2016  . Swallowing difficulty 03/28/2016  . Hypomagnesemia 02/29/2016  . Hoarseness 02/18/2016  . Cancer related pain 07/08/2015  . Adenocarcinoma, lung ([place]) 03/26/2015  . 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 Ear ache 10/03/2014  . Abscess of buccal cavity 10/03/2014  . Lump in neck 10/03/2014  . Laryngeal pain 10/03/2014    Orientation RESPIRATION BLADDER Height & Weight     Self  Normal Continent Weight: 112 lb 9.6 oz (51.1 kg) Height:  '5\' 4"'$  (162.6 cm)  BEHAVIORAL SYMPTOMS/MOOD NEUROLOGICAL BOWEL NUTRITION STATUS      Continent Diet (Heart Healthy, Thin Liquids)  AMBULATORY STATUS COMMUNICATION OF NEEDS Skin   Limited Assist Verbally Normal                       Personal Care Assistance Level of Assistance  Bathing, Feeding, Dressing Bathing Assistance: Limited assistance Feeding assistance: Independent Dressing Assistance: Limited assistance     Functional Limitations Info  Sight, Hearing, Speech Sight Info: Adequate Hearing Info: Adequate Speech Info: Adequate    SPECIAL CARE FACTORS FREQUENCY                       Contractures Contractures Info: Not present    Additional Factors Info  Code Status, Allergies Code Status Info: Full Code Allergies Info: No know allergies           Current Medications (  05/01/2017):  This is the current hospital active medication list Current Facility-Administered Medications  Medication Dose Route Frequency Provider Last Rate Last Dose  . acetaminophen (TYLENOL) tablet 650 mg  650 mg Oral Q4H PRN Hugelmeyer, Alexis, DO      . ALPRAZolam Duanne Moron) tablet 0.25 mg  0.25 mg Oral TID PRN Hugelmeyer, Alexis, DO   0.25 mg at 05/01/17 1520  . aspirin EC tablet 81  mg  81 mg Oral Daily Christell Faith M, PA-C   81 mg at 05/01/17 0867  . atorvastatin (LIPITOR) tablet 80 mg  80 mg Oral q1800 Hugelmeyer, Alexis, DO   80 mg at 04/29/17 2343  . enoxaparin (LOVENOX) injection 40 mg  40 mg Subcutaneous Q24H Demetrios Loll, MD      . feeding supplement (BOOST / RESOURCE BREEZE) liquid 1 Container  1 Container Oral BID BM Hugelmeyer, Alexis, DO   1 Container at 05/01/17 0931  . folic acid (FOLVITE) tablet 1 mg  1 mg Oral Daily Hugelmeyer, Alexis, DO   1 mg at 05/01/17 0927  . gi cocktail (Maalox,Lidocaine,Donnatal)  30 mL Oral QID PRN Hugelmeyer, Alexis, DO      . ipratropium-albuterol (DUONEB) 0.5-2.5 (3) MG/3ML nebulizer solution 3 mL  3 mL Nebulization Q4H PRN Demetrios Loll, MD      . levothyroxine (SYNTHROID, LEVOTHROID) tablet 50 mcg  50 mcg Oral QAC breakfast Hugelmeyer, Alexis, DO   50 mcg at 05/01/17 0927  . lidocaine (XYLOCAINE) 2 % viscous mouth solution 10 mL  10 mL Mouth/Throat Q4H PRN Hugelmeyer, Alexis, DO      . magnesium oxide (MAG-OX) tablet 400 mg  400 mg Oral BID Hugelmeyer, Alexis, DO   400 mg at 05/01/17 0927  . megestrol (MEGACE) 40 MG/ML suspension 200 mg  200 mg Oral BID Hugelmeyer, Alexis, DO   200 mg at 05/01/17 0930  . methimazole (TAPAZOLE) tablet 5 mg  5 mg Oral Daily Hugelmeyer, Alexis, DO   5 mg at 05/01/17 0927  . metoprolol tartrate (LOPRESSOR) tablet 25 mg  25 mg Oral BID Hugelmeyer, Alexis, DO   25 mg at 05/01/17 0927  . morphine CONCENTRATE 10 MG/0.5ML oral solution 10 mg  10 mg Oral Q4H PRN Hugelmeyer, Alexis, DO   10 mg at 05/01/17 0515  . ondansetron (ZOFRAN) injection 4 mg  4 mg Intravenous Q6H PRN Hugelmeyer, Alexis, DO      . ondansetron (ZOFRAN) tablet 4 mg  4 mg Oral Q6H PRN Hugelmeyer, Alexis, DO      . oxyCODONE-acetaminophen (PERCOCET/ROXICET) 5-325 MG per tablet 1 tablet  1 tablet Oral Q6H PRN Demetrios Loll, MD   1 tablet at 05/01/17 [phone]  . pantoprazole (PROTONIX) EC tablet 40 mg  40 mg Oral Daily Hugelmeyer, Alexis, DO   40 mg at  05/01/17 0927  . potassium chloride SA (K-DUR,KLOR-CON) CR tablet 20 mEq  20 mEq Oral BID Hugelmeyer, Alexis, DO   20 mEq at 05/01/17 0927  . predniSONE (DELTASONE) tablet 60 mg  60 mg Oral Daily Hugelmeyer, Alexis, DO   60 mg at 05/01/17 0927  . thiamine (VITAMIN B-1) tablet 100 mg  100 mg Oral Daily Hugelmeyer, Alexis, DO   100 mg at 05/01/17 0927  . tiotropium [facility]) inhalation capsule 18 mcg  18 mcg Inhalation Daily Hugelmeyer, Alexis, DO   18 mcg at 05/01/17 0930  . zolpidem (AMBIEN) tablet 5 mg  5 mg Oral QHS PRN Hugelmeyer, Alexis, DO   5 mg at 04/30/17 2113     Discharge  Medications: Please see discharge summary for a list of discharge medications.  Relevant Imaging Results:  Relevant Lab Results:   Additional Information SSN:  [id number]   Pt will have Hospice Services following at facility   Darden Dates, LCSW

## 2017-05-01 NOTE — Discharge Summary (Signed)
[place] [facility] [place] NAME: Audrey Mcgee    MR#:  [id number]  DATE OF BIRTH:  [birth year]  DATE OF ADMISSION:  04/29/2017   ADMITTING PHYSICIAN: Harvie Bridge, DO  DATE OF DISCHARGE:  05/01/2017  PRIMARY CARE PHYSICIAN: Donnie Coffin, MD   ADMISSION DIAGNOSIS:  Shortness of breath [R06.02] COPD exacerbation ([place]) [J44.1] DISCHARGE DIAGNOSIS:  Principal Problem:   Dementia with behavioral disturbance Active Problems:   Cancer related pain   COPD exacerbation (HCC)   Sinus tachycardia   Weight loss   Metastatic lung cancer (metastasis from lung to other site) (HCC)   Elevated troponin   Chest pain  SECONDARY DIAGNOSIS:   Past Medical History:  Diagnosis Date  . COPD (chronic obstructive pulmonary disease) ([place])   . Dementia   . Hypertension   . Lung cancer ([place])   . Thyroid disease    HOSPITAL COURSE:   This is a 56 y.o.femalewith a history of ung cancer with mets, COPD, dementia, HTN, hyperthyroidismnow being admitted with:  #. Chest pain, Shortness of breath with elevated troponin,no ACS or PE. Continue aspirin. Given her diffuse metastases, symptoms are likely noncardiac in nature, no further significant cardiac workup needed per Dr. Rockey Situ. Echo is normal. CT of the chest with angiogram showed No pulmonary embolus.  Tachycardia, sinus Continue Lopressor 25 mg twice a day.  #. History of lung cancer - Continue Megace - Continue anxiolytics, antiemetics and pain control - Continue Duonebs and Spiriva  #. History of hyperthyroidism - Will continue tapazole and levothyroxine for now. Will need monitoring and medication adjustment as outpatient  #. History of HTN, tachycardia - Continue metoprolol  #. History of GERD - Continue Protonix for Prilosec  Malnutrition. Continue Megace.  DISCHARGE CONDITIONS:  stable, discharge to home today. CONSULTS OBTAINED:  Treatment Team:  [place], Madie [place],  MD DRUG ALLERGIES:   Allergies  Allergen Reactions  . No Known Allergies    DISCHARGE MEDICATIONS:   Allergies as of 05/01/2017      Reactions   No Known Allergies       Medication List    STOP taking these medications   doxycycline 100 MG capsule Commonly known as:  VIBRAMYCIN     TAKE these medications   ALPRAZolam 0.25 MG tablet Commonly known as:  XANAX Take 1 tablet (0.25 mg total) by mouth 3 (three) times daily as needed for anxiety.   aspirin 81 MG EC tablet Take 1 tablet (81 mg total) by mouth daily.   docusate sodium 100 MG capsule Commonly known as:  COLACE Take 100 mg by mouth 2 (two) times daily as needed for mild constipation.   feeding supplement Liqd Take 1 Container by mouth 2 (two) times daily between meals.   folic acid 175 MCG tablet Commonly known as:  FOLVITE Take 800 mcg by mouth daily.   ipratropium-albuterol 0.5-2.5 (3) MG/3ML Soln Commonly known as:  DUONEB Take 3 mLs by nebulization every 4 (four) hours.   levothyroxine 100 MCG tablet Commonly known as:  SYNTHROID, LEVOTHROID Take 100 mcg by mouth daily before breakfast.   lidocaine 2 % solution Commonly known as:  XYLOCAINE Use as directed 10 mLs in the mouth or throat every 4 (four) hours as needed for mouth pain.   magnesium oxide 400 MG tablet Commonly known as:  MAG-OX Take 1 tablet (400 mg total) by mouth 2 (two) times daily.   megestrol 40 MG/ML suspension Commonly known as:  MEGACE Take 5 mLs (200 mg total) by mouth 2 (two) times daily.   methimazole 5 MG tablet Commonly known as:  TAPAZOLE Take 1 tablet (5 mg total) by mouth daily.   metoprolol 50 MG tablet Commonly known as:  LOPRESSOR Take 0.5 tablets (25 mg total) by mouth 2 (two) times daily.   morphine CONCENTRATE 10 mg / 0.5 ml concentrated solution Take 0.5 mLs (10 mg total) by mouth every 4 (four) hours as needed for severe pain.   omeprazole 20 MG capsule Commonly known as:  PRILOSEC Take 1 capsule (20  mg total) by mouth daily.   ondansetron 4 MG tablet Commonly known as:  ZOFRAN Take 1 tablet (4 mg total) by mouth every 6 (six) hours as needed for nausea.   Potassium Chloride ER 20 MEQ Tbcr Take 1 tab twice a day   predniSONE 20 MG tablet Commonly known as:  DELTASONE Take 3 tablets (60 mg total) by mouth daily.   thiamine 100 MG tablet Commonly known as:  VITAMIN B-1 Take 1 tablet (100 mg total) by mouth daily.   tiotropium 18 MCG inhalation capsule Commonly known as:  SPIRIVA Place 1 capsule (18 mcg total) into inhaler and inhale daily.   traZODone 50 MG tablet Commonly known as:  DESYREL Take 1 tablet (50 mg total) by mouth at bedtime as needed for sleep.        DISCHARGE INSTRUCTIONS:  See AVS.  If you experience worsening of your admission symptoms, develop shortness of breath, life threatening emergency, suicidal or homicidal thoughts you must seek medical attention immediately by calling 911 or calling your MD immediately  if symptoms less severe.  You Must read complete instructions/literature along with all the possible adverse reactions/side effects for all the Medicines you take and that have been prescribed to you. Take any new Medicines after you have completely understood and accpet all the possible adverse reactions/side effects.   Please note  You were cared for by a hospitalist during your hospital stay. If you have any questions about your discharge medications or the care you received while you were in the hospital after you are discharged, you can call the unit and asked to speak with the hospitalist on call if the hospitalist that took care of you is not available. Once you are discharged, your primary care physician will handle any further medical issues. Please note that NO REFILLS for any discharge medications will be authorized once you are discharged, as it is imperative that you return to your primary care physician (or establish a relationship with  a primary care physician if you do not have one) for your aftercare needs so that they can reassess your need for medications and monitor your lab values.    On the day of Discharge:  VITAL SIGNS:  Blood pressure (!) 162/98, pulse (!) 106, temperature 97.8 F (36.6 C), temperature source Oral, resp. rate 18, height '5\' 4"'$  (1.626 m), weight 112 lb 9.6 oz (51.1 kg), last menstrual period 01/30/2008, SpO2 100 %. PHYSICAL EXAMINATION:  GENERAL:  56 y.o.-year-old patient lying in the bed with no acute distress.  EYES: Pupils equal, round, reactive to light and accommodation. No scleral icterus. Extraocular muscles intact.  HEENT: Head atraumatic, normocephalic. Oropharynx and nasopharynx clear.  NECK:  Supple, no jugular venous distention. No thyroid enlargement, no tenderness.  LUNGS: Normal breath sounds bilaterally, no wheezing, rales,rhonchi or crepitation. No use of accessory muscles of respiration.  CARDIOVASCULAR: S1, S2 normal. No murmurs, rubs,  or gallops.  ABDOMEN: Soft, non-tender, non-distended. Bowel sounds present. No organomegaly or mass.  EXTREMITIES: No pedal edema, cyanosis, or clubbing.  NEUROLOGIC: Cranial nerves II through XII are intact. Muscle strength 5/5 in all extremities. Sensation intact. Gait not checked.  PSYCHIATRIC: The patient is alert and oriented x 3.  SKIN: No obvious rash, lesion, or ulcer.  DATA REVIEW:   CBC  Recent Labs Lab 04/30/17 0406  WBC 8.3  HGB 9.8*  HCT 29.4*  PLT 225    Chemistries   Recent Labs Lab 04/28/17 2249 04/29/17 1756  NA 139 137  K 4.0 4.2  CL 104 105  CO2 22 24  GLUCOSE 170* 133*  BUN 5* 6  CREATININE 0.65 0.61  CALCIUM 9.2 8.6*  AST 41  --   ALT 9*  --   ALKPHOS 82  --   BILITOT 1.6*  --      Microbiology Results  Results for orders placed or performed during the hospital encounter of 04/20/17  Blood Culture (routine x 2)     Status: None   Collection Time: 04/20/17  9:57 PM  Result Value Ref Range Status    Specimen Description BLOOD LEFT HAND  Final   Special Requests   Final    BOTTLES DRAWN AEROBIC AND ANAEROBIC Blood Culture adequate volume   Culture NO GROWTH 5 DAYS  Final   Report Status 04/25/2017 FINAL  Final  Blood Culture (routine x 2)     Status: None   Collection Time: 04/20/17 10:00 PM  Result Value Ref Range Status   Specimen Description BLOOD LEFT FOREARM  Final   Special Requests   Final    BOTTLES DRAWN AEROBIC AND ANAEROBIC Blood Culture adequate volume   Culture NO GROWTH 5 DAYS  Final   Report Status 04/25/2017 FINAL  Final    RADIOLOGY:  No results found.   Management plans discussed with the patient, family and they are in agreement.  CODE STATUS: Full Code   TOTAL TIME TAKING CARE OF THIS PATIENT: 23 minutes.    Demetrios Loll M.D on 05/01/2017 at 12:47 PM  Between 7am to 6pm - Pager - [phone]  After 6pm go to [email or web address] - Technical brewer [facility]  Office  [phone]  CC: Primary care physician; Donnie Coffin, MD   Note: This dictation was prepared with Dragon dictation along with smaller phrase technology. Any transcriptional errors that result from this process are unintentional.

## 2017-05-01 NOTE — Discharge Instructions (Signed)
Regular diet

## 2017-05-02 MED ORDER — SODIUM CHLORIDE 0.9% FLUSH
3.0000 mL | Freq: Two times a day (BID) | INTRAVENOUS | Status: DC
  Administered 2017-05-02 – 2017-05-07 (×12): 3 mL via INTRAVENOUS

## 2017-05-02 NOTE — Plan of Care (Signed)
Problem: Education: Goal: Knowledge of [facility] Education information/materials will improve Outcome: Progressing Bradycardic, hypoxic early in shift, in NAD, pt encouraged to breathe deeply, VS WDL, no other interventions needed.  VSS otherwise, free of falls.  Pt anxious, requested sleep aid, received PRN PO Ambien '5mg'$  x1, asleep majority of shift.  Denies pain, no other needs overnight.  Bed in low position, call bell within reach.  WCTM.

## 2017-05-02 NOTE — Progress Notes (Addendum)
Continued limited cognition- offering variety of foods/beverages with poor intake after encouragement. Limited cognition continues. Morphine po x 1 for upper chest/back bilateral pain effective. Naps at intervals. Diet advance to regular for food choices for pt. Just sipping on resource breeze from this morning; no food eaten from lunch tray.

## 2017-05-02 NOTE — Progress Notes (Addendum)
[place] [facility] [place] NAME: Audrey Mcgee    MR#:  [id number]  DATE OF BIRTH:  March 05, 1961  SUBJECTIVE:  CHIEF COMPLAINT:   Chief Complaint  Patient presents with  . Shortness of Breath   Mild chest pain and back pain. No shortness of breath or cough. REVIEW OF SYSTEMS:  Review of Systems  Constitutional: Negative for chills, fever and malaise/fatigue.  HENT: Negative for congestion and sore throat.   Eyes: Negative for blurred vision and double vision.  Respiratory: Negative for cough, hemoptysis, shortness of breath and stridor.   Cardiovascular: Positive for chest pain. Negative for palpitations and leg swelling.  Gastrointestinal: Negative for abdominal pain, blood in stool, diarrhea, melena, nausea and vomiting.  Genitourinary: Negative for dysuria and hematuria.  Musculoskeletal: Positive for back pain.  Neurological: Negative for dizziness, focal weakness, loss of consciousness, weakness and headaches.  Psychiatric/Behavioral: Negative for depression. The patient is not nervous/anxious.     DRUG ALLERGIES:   Allergies  Allergen Reactions  . No Known Allergies    VITALS:  Blood pressure (!) 122/98, pulse 94, temperature 98.1 F (36.7 C), temperature source Oral, resp. rate 18, height '5\' 4"'$  (1.626 m), weight 112 lb 9.6 oz (51.1 kg), last menstrual period 01/30/2008, SpO2 100 %. PHYSICAL EXAMINATION:  Physical Exam  Constitutional: She is oriented to person, place, and time.  Malnutrition.  HENT:  Head: Normocephalic.  Mouth/Throat: Oropharynx is clear and moist.  Eyes: Conjunctivae and EOM are normal.  Neck: Normal range of motion. Neck supple. No JVD present. No tracheal deviation present.  Cardiovascular: Normal rate, regular rhythm and normal heart sounds.  Exam reveals no gallop.   No murmur heard. Pulmonary/Chest: Effort normal and breath sounds normal. No respiratory distress.  Abdominal: Soft. Bowel sounds are  normal. She exhibits no distension. There is no tenderness.  Musculoskeletal: Normal range of motion. She exhibits no edema or tenderness.  Neurological: She is alert and oriented to person, place, and time. No cranial nerve deficit.  Skin: No rash noted. No erythema.  Psychiatric: Affect normal.   LABORATORY PANEL:  Female CBC  Recent Labs Lab 04/30/17 0406  WBC 8.3  HGB 9.8*  HCT 29.4*  PLT 225   ------------------------------------------------------------------------------------------------------------------ Chemistries   Recent Labs Lab 04/28/17 2249 04/29/17 1756  NA 139 137  K 4.0 4.2  CL 104 105  CO2 22 24  GLUCOSE 170* 133*  BUN 5* 6  CREATININE 0.65 0.61  CALCIUM 9.2 8.6*  AST 41  --   ALT 9*  --   ALKPHOS 82  --   BILITOT 1.6*  --    RADIOLOGY:  No results found. ASSESSMENT AND PLAN:   This is a 56 y.o. female with a history of ung cancer with mets, COPD, dementia, HTN, hyperthyroidism  now being admitted with:  #. Chest pain, Shortness of breath with elevated troponin, no ACS or PE. Possible related to metastatic lung cancer.  Continue aspirin. Given her diffuse metastases, symptoms are likely noncardiac in nature, no further significant cardiac workup needed per Dr. Rockey Situ. Echo is pending. CT of the chest with angiogram showed No pulmonary embolus.  Tachycardia, sinus, improved. Continue Lopressor 25 mg twice a day.  #. History of lung cancer - Continue Megace - Continue anxiolytics, antiemetics and pain control - Continue Duonebs and Spiriva  #. History of hyperthyroidism - Will continue tapazole and levothyroxine for now. Will need monitoring and medication adjustment as outpatient  #. History  of HTN, tachycardia - Continue metoprolol  #. History of GERD - Continue Protonix for Prilosec  Malnutrition. Continue Megace.  The patient has no capacity to make a decision, waiting for guardianship and placement.  Palliative care  consult. All the records are reviewed and case discussed with Care Management/Social Worker. Management plans discussed with the patient, family and they are in agreement.  CODE STATUS: Full Code  TOTAL TIME TAKING CARE OF THIS PATIENT: 26 minutes.   More than 50% of the time was spent in counseling/coordination of care: YES  POSSIBLE D/C IN 2 DAYS, DEPENDING ON CLINICAL CONDITION.   Demetrios Loll M.D on 05/02/2017 at 10:55 AM  Between 7am to 6pm - Pager - [phone]  After 6pm go to [email or web address] - Technical brewer [facility]  Office  [phone]  CC: Primary care physician; Donnie Coffin, MD  Note: This dictation was prepared with Dragon dictation along with smaller phrase technology. Any transcriptional errors that result from this process are unintentional.

## 2017-05-02 NOTE — Plan of Care (Signed)
Problem: Nutrition: Goal: Adequate nutrition will be maintained Outcome: Not Progressing Has poor appetite. Orders variety of foods but states she will eat later. Drinks only small amounts when encouraged; refuses most food offers. Denies nausea.

## 2017-05-03 LAB — CREATININE, SERUM
CREATININE: 0.39 mg/dL — AB (ref 0.44–1.00)
GFR calc Af Amer: >60 mL/min (ref >60)
GFR calc non Af Amer: >60 mL/min (ref >60)

## 2017-05-03 MED ORDER — ORAL CARE MOUTH RINSE
15.0000 mL | Freq: Two times a day (BID) | OROMUCOSAL | Status: DC
  Administered 2017-05-03 – 2017-05-08 (×9): 15 mL via OROMUCOSAL

## 2017-05-03 NOTE — Clinical Social Work Note (Signed)
CSW attempted to call the patient's daughter, [name], to deliver bed offers. The patient's daughter did not answer; CSW left message and is waiting for call back. CSW will continue to follow.  Santiago Bumpers, MSW, Latanya Presser [phone]

## 2017-05-03 NOTE — Plan of Care (Signed)
Problem: Nutrition: Goal: Adequate nutrition will be maintained Outcome: Not Progressing Patient has a decreased appetite, only taking bites and sips per report. Patient is ordered scheduled Megace and continues to have a decreased appetite.

## 2017-05-03 NOTE — Progress Notes (Addendum)
[place], reported to nurse that family member, dgt, on pt's phone and is cursing and wants to know about pt's condition. She advised person on phone they would get me and I could speak with them because they could not give out information. RN went to room with no one on the phone. Pt upset, tearful, states "I feel sick". Denies nausea; reports having usual pain in upper chest/back. States she is generallly feeling ill. Emotional support given. Xanax and percocet given with relief. Pt currently resting quietly with eyes closed. No communication has been received from family. Meds were crushed and given in applesauce except potassium- pt tries to hide meds and not take them.

## 2017-05-03 NOTE — Plan of Care (Signed)
Problem: Activity: Goal: Risk for activity intolerance will decrease Outcome: Not Progressing Gradually becoming weaker with no improvement in activity tolerance. Turns and moves self in bed. Limited trips to [facility]

## 2017-05-03 NOTE — Progress Notes (Signed)
[facility] for Lovenox  Indication: chest pain/ACS  Allergies  Allergen Reactions  . No Known Allergies     Patient Measurements: Height: '5\' 4"'$  (162.6 cm) Weight: 112 lb 9.6 oz (51.1 kg) IBW/kg (Calculated) : 54.7 Heparin Dosing Weight:   Vital Signs: Temp: 98.6 F (37 C) (05/13 1354) Temp Source: Oral (05/13 1354) BP: 123/75 (05/13 1354) Pulse Rate: 85 (05/13 1354)  Labs:  Recent Labs  05/03/17 0528  CREATININE 0.39*    Estimated Creatinine Clearance: 64.9 mL/min (A) (by C-G formula based on SCr of 0.39 mg/dL (L)).   Medical History: Past Medical History:  Diagnosis Date  . COPD (chronic obstructive pulmonary disease) ([place])   . Dementia   . Hypertension   . Lung cancer ([place])   . Thyroid disease     Medications:  Prescriptions Prior to Admission  Medication Sig Dispense Refill Last Dose  . ALPRAZolam (XANAX) 0.25 MG tablet Take 1 tablet (0.25 mg total) by mouth 3 (three) times daily as needed for anxiety. 20 tablet 0 unknown at unknown  . docusate sodium (COLACE) 100 MG capsule Take 100 mg by mouth 2 (two) times daily as needed for mild constipation.   unknown at unknown  . feeding supplement (BOOST / RESOURCE BREEZE) LIQD Take 1 Container by mouth 2 (two) times daily between meals. 90 Container 6 04/29/2017 at unknown  . folic acid (FOLVITE) 409 MCG tablet Take 800 mcg by mouth daily.    04/29/2017 at am  . ipratropium-albuterol (DUONEB) 0.5-2.5 (3) MG/3ML SOLN Take 3 mLs by nebulization every 4 (four) hours. 360 mL 5 prn at prn  . levothyroxine (SYNTHROID, LEVOTHROID) 100 MCG tablet Take 100 mcg by mouth daily before breakfast.   0 04/29/2017 at am  . lidocaine (XYLOCAINE) 2 % solution Use as directed 10 mLs in the mouth or throat every 4 (four) hours as needed for mouth pain.   prn at prn  . magnesium oxide (MAG-OX) 400 MG tablet Take 1 tablet (400 mg total) by mouth 2 (two) times daily. 30 tablet 0 04/29/2017 at am  . megestrol  (MEGACE) 40 MG/ML suspension Take 5 mLs (200 mg total) by mouth 2 (two) times daily. 300 mL 1 04/29/2017 at am  . methimazole (TAPAZOLE) 5 MG tablet Take 1 tablet (5 mg total) by mouth daily. 30 tablet 0 04/29/2017 at am  . metoprolol (LOPRESSOR) 50 MG tablet Take 0.5 tablets (25 mg total) by mouth 2 (two) times daily. 30 tablet 0 04/29/2017 at 0930  . Morphine Sulfate (MORPHINE CONCENTRATE) 10 mg / 0.5 ml concentrated solution Take 0.5 mLs (10 mg total) by mouth every 4 (four) hours as needed for severe pain. 30 mL 0 prn at prn  . omeprazole (PRILOSEC) 20 MG capsule Take 1 capsule (20 mg total) by mouth daily. 30 capsule 3 04/29/2017 at am  . ondansetron (ZOFRAN) 4 MG tablet Take 1 tablet (4 mg total) by mouth every 6 (six) hours as needed for nausea. 20 tablet 0 prn at prn  . potassium chloride 20 MEQ TBCR Take 1 tab twice a day 60 tablet 0 04/29/2017 at am  . [EXPIRED] predniSONE (DELTASONE) 20 MG tablet Take 3 tablets (60 mg total) by mouth daily. 12 tablet 0 04/29/2017 at 0930  . thiamine (VITAMIN B-1) 100 MG tablet Take 1 tablet (100 mg total) by mouth daily. 30 tablet 1 04/29/2017 at am  . tiotropium (SPIRIVA) 18 MCG inhalation capsule Place 1 capsule (18 mcg total) into  inhaler and inhale daily. 30 capsule 1 04/29/2017 at am  . doxycycline (VIBRAMYCIN) 100 MG capsule Take 1 capsule (100 mg total) by mouth 2 (two) times daily. (Patient not taking: Reported on 04/29/2017) 14 capsule 0 Not Taking  . traZODone (DESYREL) 50 MG tablet Take 1 tablet (50 mg total) by mouth at bedtime as needed for sleep. (Patient not taking: Reported on 04/29/2017) 30 tablet 1 Not Taking at unknown    Assessment: Pharmacy consulted to dose lovenox in this 56 year old female admitted with ACS/NSTEMI.  CrCl = 60.4 ml/min,  TBW = 47.6 kg  No prior anticoag noted.   Goal of Therapy:   Monitor platelets by anticoagulation protocol: Yes   Plan:  Lovenox 50 mg IV Q12H ordered to start on 5/9 @ 21:30. Will check CBC daily.   5/13: No  ACS/PE etc- Patient now on Lovenox '40mg'$  Q24h   Audrey Mcgee A 05/03/2017,2:33 PM

## 2017-05-03 NOTE — Progress Notes (Signed)
[place] [facility] at [place] NAME: Audrey Mcgee    MR#:  [id number]  DATE OF BIRTH:  12/05/1961  SUBJECTIVE:  CHIEF COMPLAINT:   Chief Complaint  Patient presents with  . Shortness of Breath   Feels sick, still has chest pain on and off, generalized weakness and no appetite REVIEW OF SYSTEMS:  Review of Systems  Constitutional: Positive for malaise/fatigue. Negative for chills and fever.  HENT: Negative for congestion and sore throat.   Eyes: Negative for blurred vision and double vision.  Respiratory: Negative for cough, hemoptysis, shortness of breath and stridor.   Cardiovascular: Positive for chest pain. Negative for palpitations and leg swelling.  Gastrointestinal: Negative for abdominal pain, blood in stool, diarrhea, melena, nausea and vomiting.  Genitourinary: Negative for dysuria and hematuria.  Musculoskeletal: Negative for back pain.  Neurological: Positive for weakness. Negative for dizziness, focal weakness, loss of consciousness and headaches.  Psychiatric/Behavioral: Negative for depression. The patient is not nervous/anxious.     DRUG ALLERGIES:   Allergies  Allergen Reactions  . No Known Allergies    VITALS:  Blood pressure (!) 149/94, pulse 86, temperature 98.4 F (36.9 C), temperature source Oral, resp. rate 20, height '5\' 4"'$  (1.626 m), weight 112 lb 9.6 oz (51.1 kg), last menstrual period 01/30/2008, SpO2 100 %. PHYSICAL EXAMINATION:  Physical Exam  Constitutional: She is oriented to person, place, and time.  Malnutrition.  HENT:  Head: Normocephalic.  Mouth/Throat: Oropharynx is clear and moist.  Eyes: Conjunctivae and EOM are normal.  Neck: Normal range of motion. Neck supple. No JVD present. No tracheal deviation present.  Cardiovascular: Normal rate, regular rhythm and normal heart sounds.  Exam reveals no gallop.   No murmur heard. Pulmonary/Chest: Effort normal and breath sounds normal. No respiratory  distress.  Abdominal: Soft. Bowel sounds are normal. She exhibits no distension. There is no tenderness.  Musculoskeletal: Normal range of motion. She exhibits no edema or tenderness.  Neurological: She is alert and oriented to person, place, and time. No cranial nerve deficit.  Skin: No rash noted. No erythema.  Psychiatric: Affect normal.   LABORATORY PANEL:  Female CBC  Recent Labs Lab 04/30/17 0406  WBC 8.3  HGB 9.8*  HCT 29.4*  PLT 225   ------------------------------------------------------------------------------------------------------------------ Chemistries   Recent Labs Lab 04/28/17 2249 04/29/17 1756 05/03/17 0528  NA 139 137  --   K 4.0 4.2  --   CL 104 105  --   CO2 22 24  --   GLUCOSE 170* 133*  --   BUN 5* 6  --   CREATININE 0.65 0.61 0.39*  CALCIUM 9.2 8.6*  --   AST 41  --   --   ALT 9*  --   --   ALKPHOS 82  --   --   BILITOT 1.6*  --   --    RADIOLOGY:  No results found. ASSESSMENT AND PLAN:   This is a 56 y.o. female with a history of ung cancer with mets, COPD, dementia, HTN, hyperthyroidism  now being admitted with:  #. Chest pain, Shortness of breath with elevated troponin, no ACS or PE. Possible related to metastatic lung cancer.  Continue aspirin. Given her diffuse metastases, symptoms are likely noncardiac in nature, no further significant cardiac workup needed per Dr. Rockey Situ. Echo is pending. CT of the chest with angiogram showed No pulmonary embolus.  Tachycardia, sinus, improved. Continue Lopressor 25 mg twice a day.  #. History  of lung cancer - Continue Megace - Continue anxiolytics, antiemetics and pain control - Continue Duonebs and Spiriva  #. History of hyperthyroidism continue tapazole and levothyroxine for now. Will need monitoring and medication adjustment as outpatient  #. History of HTN, tachycardia improved. - Continue metoprolol  #. History of GERD - Continue Protonix for Prilosec  Malnutrition. Continue  Megace.  The patient has no capacity to make a decision, waiting for guardianship and placement.  Palliative care consult. The patient has poor prognosis. Her condition is declining, she may need hospice care. All the records are reviewed and case discussed with Care Management/Social Worker. Management plans discussed with the patient, family and they are in agreement.  CODE STATUS: Full Code  TOTAL TIME TAKING CARE OF THIS PATIENT: 26 minutes.   More than 50% of the time was spent in counseling/coordination of care: YES  POSSIBLE D/C IN 2 DAYS, DEPENDING ON CLINICAL CONDITION.   Demetrios Loll M.D on 05/03/2017 at 12:06 PM  Between 7am to 6pm - Pager - [phone]  After 6pm go to [email or web address] - [employer] [facility]  Office  [phone]  CC: Primary care physician; Donnie Coffin, MD  Note: This dictation was prepared with Dragon dictation along with smaller phrase technology. Any transcriptional errors that result from this process are unintentional.

## 2017-05-03 NOTE — Progress Notes (Signed)
Pt pleasant and calm now; poor intake with maximum effort. Pain well controlled. Respirations easy/even without distress. Napped at frequent intervals.  Generalized weakness and feeling sick related to stage of disease process. No family in to see pt/no further calls at this time.

## 2017-05-04 NOTE — Progress Notes (Signed)
Pharmacist Note:  Patient on Methimazole (Tapazole) which is an anti-thyroid agent and Levothyroxine (Synthroid) which is a thyroid agent. Patient has history of Hyperthyroidism and myxedema coma.  Discussed with Dr. Benjie Karvonen. Patient has not followed up with Endocrinologist and needs f/u. Dose of Levothyroxine at home was 100 mcg daily which has been decreased to 50 mcg daily.  Chinita Greenland PharmD Clinical Pharmacist 05/04/2017

## 2017-05-04 NOTE — Discharge Summary (Addendum)
[facility] [place] NAME: Audrey Mcgee    MR#:  [id number]  DATE OF BIRTH:  Sep 28, 1961  DATE OF ADMISSION:  04/29/2017 ADMITTING PHYSICIAN: Harvie Bridge, DO  DATE OF DISCHARGE: 05/08/2017  PRIMARY CARE PHYSICIAN: Donnie Coffin, MD    ADMISSION DIAGNOSIS:  Shortness of breath [R06.02] COPD exacerbation (HCC) [J44.1]  DISCHARGE DIAGNOSIS:  Principal Problem:   Chest pain Active Problems:   Cancer related pain   COPD exacerbation (HCC)   Sinus tachycardia   Weight loss   Metastatic lung cancer (metastasis from lung to other site) (HCC)   Elevated troponin   Chest pain   SECONDARY DIAGNOSIS:   Past Medical History:  Diagnosis Date  . COPD (chronic obstructive pulmonary disease) ([place])   . Dementia   . Hypertension   . Lung cancer ([place])   . 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  1. Chest pain: Patient has been ruled out for non-ST elevation MI. Chest pain is likely due to metastatic cancer CT chest showed no pulmonary emboli  2. History of lung cancer: Not a candidate for further treatment She would benefit from hospice although in the past has refused  3. Sinus tachycardia: Continue metoprolol  4. Protein calorie malnutrition: Continue boost supplements and Megace  5. Hypothyroidism: Continue Tapazole 6. 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 She would benefit from Palliative care at facility   DISCHARGE CONDITIONS AND DIET:   Stable Regular diet  CONSULTS OBTAINED:  Treatment Team:  Clapacs, Madie Reno, MD  DRUG ALLERGIES:   Allergies  Allergen Reactions  . No Known Allergies     DISCHARGE MEDICATIONS:   Current Discharge Medication List    START taking these medications   Details  aspirin EC 81 MG EC tablet Take 1 tablet (81 mg  total) by mouth daily. Qty: 30 tablet, Refills: 2      CONTINUE these medications which have CHANGED   Details  ALPRAZolam (XANAX) 0.25 MG tablet Take 1 tablet (0.25 mg total) by mouth 3 (three) times daily as needed for anxiety. Qty: 20 tablet, Refills: 0    Morphine Sulfate (MORPHINE CONCENTRATE) 10 mg / 0.5 ml concentrated solution Take 0.5 mLs (10 mg total) by mouth every 4 (four) hours as needed for severe pain. Qty: 30 mL, Refills: 0      CONTINUE these medications which have NOT CHANGED   Details  docusate sodium (COLACE) 100 MG capsule Take 100 mg by mouth 2 (two) times daily as needed for mild constipation.    feeding supplement (BOOST / RESOURCE BREEZE) LIQD Take 1 Container by mouth 2 (two) times daily between meals. Qty: 90 Container, Refills: 6    folic acid (FOLVITE) 782 MCG tablet Take 800 mcg by mouth daily.     ipratropium-albuterol (DUONEB) 0.5-2.5 (3) MG/3ML SOLN Take 3 mLs by nebulization every 4 (four) hours. Qty: 360 mL, Refills: 5    levothyroxine (SYNTHROID, LEVOTHROID) 100 MCG tablet Take 100 mcg by mouth daily before breakfast.  Refills: 0    lidocaine (XYLOCAINE) 2 % solution Use as directed 10 mLs in the mouth or throat every 4 (four) hours as needed for mouth pain.    magnesium oxide (MAG-OX) 400 MG tablet Take 1 tablet (400 mg total) by mouth 2 (two) times daily. Qty: 30 tablet, Refills: 0  megestrol (MEGACE) 40 MG/ML suspension Take 5 mLs (200 mg total) by mouth 2 (two) times daily. 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 Qty: 30 tablet, Refills: 0    metoprolol (LOPRESSOR) 50 MG tablet Take 0.5 tablets (25 mg total) by mouth 2 (two) times daily. Qty: 30 tablet, Refills: 0    omeprazole (PRILOSEC)  20 MG capsule Take 1 capsule (20 mg total) by mouth daily. Qty: 30 capsule, Refills: 3    ondansetron (ZOFRAN) 4 MG tablet Take 1 tablet (4 mg total) by mouth every 6 (six) hours as needed for nausea. Qty: 20 tablet, Refills: 0    potassium chloride 20 MEQ TBCR Take 1 tab twice a day Qty: 60 tablet, Refills: 0    thiamine (VITAMIN B-1) 100 MG tablet Take 1 tablet (100 mg total) by mouth daily. Qty: 30 tablet, Refills: 1    tiotropium (SPIRIVA) 18 MCG inhalation capsule Place 1 capsule (18 mcg total) into inhaler and inhale daily. Qty: 30 capsule, Refills: 1    traZODone (DESYREL) 50 MG tablet Take 1 tablet (50 mg total) by mouth at bedtime as needed for sleep. 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 rate (!) 22, height '5\' 4"'$  (1.626 m), weight 46.9 kg (103 lb 4.8 oz), last menstrual period 01/30/2008, SpO2 100 %.   REVIEW OF SYSTEMS:  Review of Systems  Constitutional: Positive for malaise/fatigue. Negative for chills and fever.  HENT: Negative.  Negative for ear discharge, ear pain, hearing loss, nosebleeds and sore throat.   Eyes: Negative.  Negative for blurred vision and pain.  Respiratory: Negative.  Negative for cough, hemoptysis, shortness of breath and wheezing.   Cardiovascular: Negative.  Negative for chest pain, palpitations and leg swelling.  Gastrointestinal: Negative.  Negative for abdominal pain, blood in stool, diarrhea, nausea and vomiting.  Genitourinary: Negative.  Negative for dysuria.   Musculoskeletal: Negative.  Negative for back pain.  Skin: Negative.   Neurological: Negative for dizziness, tremors, speech change, focal weakness, seizures and headaches.  Endo/Heme/Allergies: Negative.  Does not bruise/bleed easily.  Psychiatric/Behavioral: Positive for memory loss. Negative for depression, hallucinations and suicidal ideas.     PHYSICAL EXAMINATION:  GENERAL:  56 y.o.-year-old patient lying in the bed with no acute distress. Thin, frail NECK:  Supple, no jugular venous distention. No thyroid enlargement, no tenderness.  LUNGS: Normal breath sounds bilaterally, no wheezing, rales,rhonchi  No use of accessory muscles of respiration.  CARDIOVASCULAR: S1, S2 normal. No murmurs, rubs, or gallops.  ABDOMEN: Soft, non-tender, non-distended. Bowel sounds present. No organomegaly or mass.  EXTREMITIES: No pedal edema, cyanosis, or clubbing.  PSYCHIATRIC: The patient is alert and oriented x name and place not time  SKIN: No obvious rash, lesion, or ulcer.   DATA REVIEW:  CBC No results for input(s): WBC, HGB, HCT, PLT in the last 168 hours.  Chemistries   Recent Labs Lab 05/05/17 0911 05/08/17 0536  NA 139  --   K 3.8  --   CL 105  --   CO2 30  --   GLUCOSE 113*  --   BUN 9  --   CREATININE 0.69 0.61  CALCIUM 8.5*  --     Cardiac Enzymes No results for input(s): TROPONINI in the last 168 hours.  Microbiology Results  '@[id number]'$ @  RADIOLOGY:  No results found.    Current Discharge Medication List    START taking these medications   Details  aspirin EC 81 MG EC tablet Take 1 tablet (81 mg total) by mouth daily. Qty: 30 tablet, Refills: 2      CONTINUE these medications which have CHANGED   Details  ALPRAZolam (XANAX) 0.25 MG tablet Take 1 tablet (0.25 mg total) by mouth 3 (three) times daily as needed for anxiety. Qty: 20 tablet, Refills: 0    Morphine Sulfate (MORPHINE CONCENTRATE) 10 mg / 0.5 ml concentrated solution Take 0.5 mLs (10 mg  total) by mouth every 4 (four) hours as needed for severe pain. Qty: 30 mL, Refills: 0      CONTINUE these medications which have NOT CHANGED   Details  docusate sodium (COLACE) 100 MG capsule Take 100 mg by mouth 2 (two) times daily as needed for mild constipation.    feeding supplement (BOOST / RESOURCE BREEZE) LIQD Take 1 Container by mouth 2 (two) times daily between meals. Qty: 90 Container, Refills: 6    folic acid (FOLVITE) 426 MCG tablet Take 800 mcg by mouth daily.     ipratropium-albuterol (DUONEB) 0.5-2.5 (3) MG/3ML SOLN Take 3 mLs by nebulization every 4 (four) hours. Qty: 360 mL, Refills: 5    levothyroxine (SYNTHROID, LEVOTHROID) 100 MCG tablet Take 100 mcg by mouth daily before breakfast.  Refills: 0    lidocaine (XYLOCAINE) 2 % solution Use as directed 10 mLs in the mouth or throat every 4 (four) hours as needed for mouth pain.    magnesium oxide (MAG-OX) 400 MG tablet Take 1 tablet (400 mg total) by mouth 2 (two) times daily. Qty: 30 tablet, Refills: 0    megestrol (MEGACE) 40 MG/ML suspension Take 5 mLs (200 mg total) by mouth 2 (two) times daily. 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 Qty: 30 tablet, Refills: 0    metoprolol (LOPRESSOR) 50 MG tablet Take 0.5 tablets (25 mg total) by mouth 2 (two) times daily. Qty: 30 tablet, Refills: 0    omeprazole (PRILOSEC) 20 MG capsule Take 1 capsule (20 mg total) by mouth daily. Qty: 30 capsule, Refills: 3    ondansetron (ZOFRAN) 4 MG tablet Take 1 tablet (4 mg total) by mouth every 6 (six) hours as needed for nausea. Qty: 20 tablet, Refills: 0    potassium chloride 20 MEQ TBCR Take 1 tab twice a day Qty: 60 tablet, Refills: 0    thiamine (VITAMIN B-1) 100 MG tablet Take  1 tablet (100 mg total) by mouth daily. Qty: 30 tablet, Refills: 1    tiotropium (SPIRIVA) 18 MCG inhalation capsule Place 1 capsule (18 mcg total) into inhaler and inhale daily. Qty: 30 capsule,  Refills: 1    traZODone (DESYREL) 50 MG tablet Take 1 tablet (50 mg total) by mouth at bedtime as needed for sleep. 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 Stable for discharge   Patient should follow up with pcp  CODE STATUS:     Code Status Orders        Start     Ordered   04/29/17 2218  Full code  Continuous     04/29/17 2217    Code Status History    Date Active Date Inactive Code Status Order ID Comments User Context   04/21/2017  2:16 AM 04/21/2017  4:48 PM Full Code [id number]  Saundra Shelling, MD Inpatient   04/14/2017  2:54 PM 04/18/2017  5:03 PM Full Code [id number]  Bettey Costa, MD Inpatient   03/13/2017  2:15 AM 03/18/2017  6:52 PM Full Code [id number]  Lance Coon, MD Inpatient   03/02/2017 11:01 PM 03/04/2017  3:20 PM Full Code [id number]  Lance Coon, MD Inpatient   02/07/2017 10:26 AM 02/21/2017  8:14 PM Full Code [id number]  Saundra Shelling, MD Inpatient   11/07/2016  6:18 AM 11/11/2016  2:39 PM Full Code [id number]  Harrie Foreman, MD Inpatient   06/21/2016 11:52 AM 06/22/2016  3:07 PM Full Code [id number]  [name], MD Inpatient      TOTAL TIME TAKING CARE OF THIS PATIENT: 69. minutes.    Note: This dictation was prepared with Dragon dictation along with smaller phrase technology. Any transcriptional errors that result from this process are unintentional.  Artha Stavros M.D on 05/08/2017 at 8:49  AM  Between 7am to 6pm - Pager - [phone] After 6pm go to [email or web address] - password [email or web address] [facility]  Office  [phone]  CC: Primary care physician; Donnie Coffin, MD

## 2017-05-04 NOTE — Progress Notes (Signed)
Palliative Medicine consult noted. Due to high referral volume, there may be a delay seeing this patient. Please call the Palliative Medicine Team office at [phone] if recommendations are needed in the interim.  Thank you for inviting [place] to see this patient.  Marjie Skiff Angeliah Wisdom, RN, BSN, [facility] 05/04/2017 4:01 PM Cell [phone] 8:00-4:00 Monday-Friday Office [phone]

## 2017-05-04 NOTE — Progress Notes (Signed)
[facility] [place] NAME: Audrey Mcgee    MR#:  [id number]  DATE OF BIRTH:  03-04-1961  SUBJECTIVE:   Patient states that she is feeling warm and would like the air condition to be turned on  REVIEW OF SYSTEMS:    Review of Systems  Constitutional: Negative for fever, chills weight loss HENT: Negative for ear pain, nosebleeds, congestion, facial swelling, rhinorrhea, neck pain, neck stiffness and ear discharge.   Respiratory: Negative for cough, shortness of breath, wheezing  Cardiovascular: Negative for chest pain, palpitations and leg swelling.  Gastrointestinal: Negative for heartburn, abdominal pain, vomiting, diarrhea or consitpation Genitourinary: Negative for dysuria, urgency, frequency, hematuria Musculoskeletal: Negative for back pain or joint pain Neurological: Negative for dizziness, seizures, syncope, focal weakness,  numbness and headaches.  Hematological: Does not bruise/bleed easily.  Psychiatric/Behavioral: She has some mild dementia   Tolerating Diet: yes      DRUG ALLERGIES:   Allergies  Allergen Reactions  . No Known Allergies     VITALS:  Blood pressure 136/90, pulse 82, temperature 98.6 F (37 C), temperature source Oral, resp. rate 18, height '5\' 4"'$  (1.626 m), weight 51.1 kg (112 lb 9.6 oz), last menstrual period 01/30/2008, SpO2 100 %.  PHYSICAL EXAMINATION:  Constitutional: AppearsVery thin and frail HENT: Normocephalic. [name] Oropharynx is clear and moist.  Eyes: Conjunctivae and EOM are normal. PERRLA, no scleral icterus.  Neck: Normal ROM. Neck supple. No JVD. No tracheal deviation. CVS: RRR, S1/S2 +, no murmurs, no gallops, no carotid bruit.  Pulmonary: Effort and breath sounds normal, no stridor, rhonchi, wheezes, rales.  Abdominal: Soft. BS +,  no distension, tenderness, rebound or guarding.  Musculoskeletal: Normal range of motion. No edema and no tenderness.  Neuro: Alert. CN 2-12 grossly  intact. No focal deficits. Oriented to name place not time Skin: Skin is warm and dry. No rash noted. Psychiatric: Normal mood and affect.      LABORATORY PANEL:   CBC  Recent Labs Lab 04/30/17 0406  WBC 8.3  HGB 9.8*  HCT 29.4*  PLT 225   ------------------------------------------------------------------------------------------------------------------  Chemistries   Recent Labs Lab 04/28/17 2249 04/29/17 1756 05/03/17 0528  NA 139 137  --   K 4.0 4.2  --   CL 104 105  --   CO2 22 24  --   GLUCOSE 170* 133*  --   BUN 5* 6  --   CREATININE 0.65 0.61 0.39*  CALCIUM 9.2 8.6*  --   AST 41  --   --   ALT 9*  --   --   ALKPHOS 82  --   --   BILITOT 1.6*  --   --    ------------------------------------------------------------------------------------------------------------------  Cardiac Enzymes  Recent Labs Lab 04/29/17 2206 04/30/17 0102 04/30/17 0406  TROPONINI 0.25* 0.25* 0.25*   ------------------------------------------------------------------------------------------------------------------  RADIOLOGY:  No results found.   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  1. Chest pain: Patient has been ruled out for non-ST elevation MI. Chest pain is likely due to metastatic cancer CT chest showed no pulmonary emboli  2. History of lung cancer: Not a candidate for further treatment She would benefit from hospice although in the past has refused  3. Sinus tachycardia: Continue metoprolol  4. Protein calorie malnutrition: Continue boost supplements and Megace  5. Hypothyroidism: Continue Tapazole 6. 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   Palliative care consult   Management plans discussed with the patient and she is in agreement.  CODE STATUS: full TOTAL TIME TAKING CARE OF THIS PATIENT: 26 minutes.      POSSIBLE D/C today??, DEPENDING ON CLINICAL CONDITION.   Jermane Brayboy M.D on 05/04/2017 at 11:44 AM  Between 7am to 6pm - Pager - [phone] After 6pm go to [email or web address] - password [email or web address] [facility]  Office  [phone]  CC: Primary care physician; Donnie Coffin, MD  Note: This dictation was prepared with Dragon dictation along with smaller phrase technology. Any transcriptional errors that result from this process are unintentional.

## 2017-05-05 ENCOUNTER — Inpatient Hospital Stay: Payer: Medicaid Other

## 2017-05-05 ENCOUNTER — Inpatient Hospital Stay: Payer: Medicaid Other | Admitting: Hematology and Oncology

## 2017-05-05 ENCOUNTER — Inpatient Hospital Stay: Payer: Medicaid Other | Admitting: Internal Medicine

## 2017-05-05 LAB — BASIC METABOLIC PANEL
Anion gap: 4 — ABNORMAL LOW (ref 5–15)
BUN: 9 mg/dL (ref 6–20)
CO2: 30 mmol/L (ref 22–32)
Calcium: 8.5 mg/dL — ABNORMAL LOW (ref 8.9–10.3)
Chloride: 105 mmol/L (ref 101–111)
Creatinine, Ser: 0.69 mg/dL (ref 0.44–1.00)
GFR calc Af Amer: >60 mL/min (ref >60)
GFR calc non Af Amer: >60 mL/min (ref >60)
GLUCOSE: 113 mg/dL — AB (ref 65–99)
POTASSIUM: 3.8 mmol/L (ref 3.5–5.1)
Sodium: 139 mmol/L (ref 135–145)

## 2017-05-05 MED ORDER — MAGNESIUM CITRATE PO SOLN
1.0000 | Freq: Once | ORAL | Status: AC
  Administered 2017-05-05: 1 via ORAL
  Filled 2017-05-05: qty 296

## 2017-05-05 NOTE — Progress Notes (Deleted)
[facility] day:  05/05/2017   Chief Complaint: Audrey Mcgee is a 56 y.o. female with metastatic poorly differentiated non-small cell lung cancer who is seen for assessment prior to consideration of reinstitution of systemic chemotherapy.  HPI: The patient was last seen in the medical oncology clinic by me on 04/24/2017.  At that time, she was seen for reassessment after 3 recent hospitalizations.  She was feeling stronger and wished to restart therapy.  She was [facility] - 05/04/2017.  She presented with chest pain.  She was ruled out for a NSTEMI.  Chest CT angiogram on 04/29/2017 revealed no pulmonary embolus.  There was decreased size of cavitary lesion in the left lower lobe. This could represent malignancy with treatment effect following chemotherapy; however, a resolving septic embolus might have the same appearance. Areas of nodular ground-glass opacity in the left upper lobe were unchanged. Echo on 04/30/2017 revealed an EF of 55-60%.   Past Medical History:  Diagnosis Date  . COPD (chronic obstructive pulmonary disease) ([place])   . Dementia   . Hypertension   . Lung cancer ([place])   . Thyroid disease     Past Surgical History:  Procedure Laterality Date  . CESAREAN SECTION CLASSICAL    . STOMACH SURGERY     Pt reports for a tumor    Family History  Problem Relation Age of Onset  . Hypertension Father   . Cancer Father   . Diabetes Father   . Cancer Maternal Aunt   . Cancer Mother     Social History:  reports that she has been smoking Cigarettes.  She has a 3.75 pack-year smoking history. She has never used smokeless tobacco. She reports that she does not drink alcohol or use drugs.  She stopped smoking.  Her nephew shot in [place] (caused delays in her treatment).  Her boyfriend's father  died of metastatic lung cancer.  Her boyfriend works at a school with children.  The patient's 1st cousin  died (age 34) of a cerebral aneurysm.  She was discharged from hospice that she did not live in a safe environment.  The patient is accompanied by her oldest daughter, [name], today.    Allergies:  Allergies  Allergen Reactions  . No Known Allergies     Current Medications: No current facility-administered medications for this visit.    Current Outpatient Prescriptions  Medication Sig Dispense Refill  . aspirin EC 81 MG EC tablet Take 1 tablet (81 mg total) by mouth daily. 30 tablet 2   Facility-Administered Medications Ordered in Other Visits  Medication Dose Route Frequency Provider Last Rate Last Dose  . acetaminophen (TYLENOL) tablet 650 mg  650 mg Oral Q4H PRN Hugelmeyer, Alexis, DO      . ALPRAZolam Duanne Moron) tablet 0.25 mg  0.25 mg Oral TID PRN Hugelmeyer, Alexis, DO   0.25 mg at 05/04/17 2316  . aspirin EC tablet 81 mg  81 mg Oral Daily Christell Faith M, PA-C   81 mg at 05/04/17 1638  . atorvastatin (LIPITOR) tablet 80 mg  80 mg Oral q1800 Hugelmeyer, Alexis, DO   80 mg at 05/04/17 1733  . enoxaparin (LOVENOX) injection 40 mg  40 mg Subcutaneous Q24H Demetrios Loll, MD   40 mg at 05/04/17 2315  . feeding supplement (BOOST / RESOURCE BREEZE) liquid 1 Container  1 Container Oral BID BM Hugelmeyer, Alexis, DO   1 Container at 05/04/17 1400  . folic  acid (FOLVITE) tablet 1 mg  1 mg Oral Daily Hugelmeyer, Alexis, DO   1 mg at 05/04/17 0855  . gi cocktail (Maalox,Lidocaine,Donnatal)  30 mL Oral QID PRN Hugelmeyer, Alexis, DO      . ipratropium-albuterol (DUONEB) 0.5-2.5 (3) MG/3ML nebulizer solution 3 mL  3 mL Nebulization Q4H PRN Demetrios Loll, MD      . levothyroxine (SYNTHROID, LEVOTHROID) tablet 50 mcg  50 mcg Oral QAC breakfast Hugelmeyer, Alexis, DO   50 mcg at 05/04/17 0631  . lidocaine (XYLOCAINE) 2 % viscous mouth solution 10 mL  10 mL Mouth/Throat Q4H PRN Hugelmeyer, Alexis, DO      . magnesium citrate solution 1 Bottle  1 Bottle Oral Once Hugelmeyer, Alexis, DO      . magnesium oxide  (MAG-OX) tablet 400 mg  400 mg Oral BID Hugelmeyer, Alexis, DO   400 mg at 05/04/17 2316  . MEDLINE mouth rinse  15 mL Mouth Rinse BID Demetrios Loll, MD   15 mL at 05/04/17 2317  . megestrol (MEGACE) 40 MG/ML suspension 200 mg  200 mg Oral BID Hugelmeyer, Alexis, DO   200 mg at 05/04/17 2317  . methimazole (TAPAZOLE) tablet 5 mg  5 mg Oral Daily Hugelmeyer, Alexis, DO   5 mg at 05/04/17 0853  . metoprolol tartrate (LOPRESSOR) tablet 25 mg  25 mg Oral BID Hugelmeyer, Alexis, DO   25 mg at 05/04/17 2315  . morphine CONCENTRATE 10 MG/0.5ML oral solution 10 mg  10 mg Oral Q4H PRN Hugelmeyer, Alexis, DO   10 mg at 05/03/17 2039  . ondansetron (ZOFRAN) injection 4 mg  4 mg Intravenous Q6H PRN Hugelmeyer, Alexis, DO      . ondansetron (ZOFRAN) tablet 4 mg  4 mg Oral Q6H PRN Hugelmeyer, Alexis, DO   4 mg at 05/04/17 1738  . oxyCODONE-acetaminophen (PERCOCET/ROXICET) 5-325 MG per tablet 1 tablet  1 tablet Oral Q6H PRN Demetrios Loll, MD   1 tablet at 05/04/17 2345  . pantoprazole (PROTONIX) EC tablet 40 mg  40 mg Oral Daily Hugelmeyer, Alexis, DO   40 mg at 05/04/17 0855  . predniSONE (DELTASONE) tablet 60 mg  60 mg Oral Daily Hugelmeyer, Alexis, DO   60 mg at 05/04/17 0853  . sodium chloride flush (NS) 0.9 % injection 3 mL  3 mL Intravenous Q12H Demetrios Loll, MD   3 mL at 05/04/17 2318  . thiamine (VITAMIN B-1) tablet 100 mg  100 mg Oral Daily Hugelmeyer, Alexis, DO   100 mg at 05/04/17 0853  . tiotropium (SPIRIVA) inhalation capsule 18 mcg  18 mcg Inhalation Daily Hugelmeyer, Alexis, DO   18 mcg at 05/04/17 0856  . zolpidem (AMBIEN) tablet 5 mg  5 mg Oral QHS PRN Hugelmeyer, Alexis, DO   5 mg at 05/04/17 2315    Review of Systems:  GENERAL: Feels "a little better". No fevers or chills. Weight down 3 pounds. PERFORMANCE STATUS (ECOG): 2 HEENT: Chronic hoarseness.  No sore throat, mouth sores or tenderness. Lungs:  Shortness of breath with exertion (stable).  No cough.  No hemoptysis. Cardiac: No chest  pain, palpitations, orthopnea, or PND. GI: Constipation. Poor appetite (requests Megace).  No nausea, vomiting, diarrhea, melena or hematochezia. GU: No urgency, frequency, dysuria, or hematuria. Musculoskeletal: Back pain.No muscle tenderness. Extremities: No pain or swelling. Skin: No rashes or skin changes. Neuro:Generally weak, but improving.  No headache, numbness or weakness, balance or coordination issues. Endocrine: No diabetes.  Thyroid issues.  No hot flashes or night sweats.  Psych: Anxiety.  Panic attacks.  No depression. Pain: Pain in shoulder and side (unchanged). Review of systems: All other systems reviewed and found to be negative.  Physical Exam: Last menstrual period 01/30/2008.  GENERAL: Thin fatigued/sleepy appearing woman sitting in a wheelchair in the exam room in no acute distress.   MENTAL STATUS: Alert and oriented to person, place and time. HEAD: Short dark styled auburn wig Normocephalic, atraumatic, no Cushingoid features.  EYES: Brown eyes. Ruddy sclera.  Pupils equal round and reactive to light and accomodation. No conjunctivitis or scleral icterus. ENT: Poor dentition.  Oropharynx clear without lesion. Tongue normal. Mucous membranes moist.  RESPIRATORY: Clear to auscultation without rales, wheezes or rhonchi. CARDIOVASCULAR: Regular rate and rhythm without murmur, rub or gallop. ABDOMEN: Soft, non-tender, with active bowel sounds, and no hepatosplenomegaly. No masses. SKIN: No rashes.  No ulcers or lesions. EXTREMITIES: No edema, skin discoloration or tenderness. No palpable cords. LYMPH NODES: No palpable cervical, supraclavicular, axillary or inguinal adenopathy  NEUROLOGICAL: Unremarkable. PSYCH: Anxious.   Admission on 04/29/2017  Component Date Value Ref Range Status  . WBC 04/29/2017 8.3  3.6 - 11.0 K/uL Final  . RBC 04/29/2017 3.62* 3.80 - 5.20 MIL/uL Final  . Hemoglobin 04/29/2017 10.3* 12.0 - 16.0 g/dL Final  .  HCT 04/29/2017 30.7* 35.0 - 47.0 % Final  . MCV 04/29/2017 84.9  80.0 - 100.0 fL Final  . MCH 04/29/2017 28.4  26.0 - 34.0 pg Final  . MCHC 04/29/2017 33.5  32.0 - 36.0 g/dL Final  . RDW 04/29/2017 15.7* 11.5 - 14.5 % Final  . Platelets 04/29/2017 198  150 - 440 K/uL Final  . Sodium 04/29/2017 137  135 - 145 mmol/L Final  . Potassium 04/29/2017 4.2  3.5 - 5.1 mmol/L Final  . Chloride 04/29/2017 105  101 - 111 mmol/L Final  . CO2 04/29/2017 24  22 - 32 mmol/L Final  . Glucose, Bld 04/29/2017 133* 65 - 99 mg/dL Final  . BUN 04/29/2017 6  6 - 20 mg/dL Final  . Creatinine, Ser 04/29/2017 0.61  0.44 - 1.00 mg/dL Final  . Calcium 04/29/2017 8.6* 8.9 - 10.3 mg/dL Final  . GFR calc non Af Amer 04/29/2017 >60  >60 mL/min Final  . GFR calc Af Amer 04/29/2017 >60  >60 mL/min Final   Comment: (NOTE) The eGFR has been calculated using the CKD EPI equation. This calculation has not been validated in all clinical situations. eGFR's persistently <60 mL/min signify possible Chronic Kidney Disease.   . Anion gap 04/29/2017 8  5 - 15 Final  . Troponin I 04/29/2017 0.25* <0.03 ng/mL Final   Comment: CRITICAL RESULT CALLED TO, READ BACK BY AND VERIFIED WITH LAURA CATES ON 04/29/2017 AT 1842 JJB   . WBC 04/30/2017 8.3  3.6 - 11.0 K/uL Final  . RBC 04/30/2017 3.44* 3.80 - 5.20 MIL/uL Final  . Hemoglobin 04/30/2017 9.8* 12.0 - 16.0 g/dL Final  . HCT 04/30/2017 29.4* 35.0 - 47.0 % Final  . MCV 04/30/2017 85.3  80.0 - 100.0 fL Final  . MCH 04/30/2017 28.5  26.0 - 34.0 pg Final  . MCHC 04/30/2017 33.4  32.0 - 36.0 g/dL Final  . RDW 04/30/2017 15.4* 11.5 - 14.5 % Final  . Platelets 04/30/2017 225  150 - 440 K/uL Final  . Troponin I 04/29/2017 0.25* <0.03 ng/mL Final   Comment: CRITICAL VALUE NOTED. VALUE IS CONSISTENT WITH PREVIOUSLY REPORTED/CALLED VALUE BY CAF   . Troponin I 04/30/2017 0.25* <0.03 ng/mL Final  CRITICAL VALUE NOTED. VALUE IS CONSISTENT WITH PREVIOUSLY REPORTED/CALLED VALUE / [place]  .  Troponin I 04/30/2017 0.25* <0.03 ng/mL Final   CRITICAL VALUE NOTED. VALUE IS CONSISTENT WITH PREVIOUSLY REPORTED/CALLED VALUE...[place]  . Weight 04/30/2017 1593.6  oz Final  . Height 04/30/2017 64  in Final  . BP 04/30/2017 137/90  mmHg Final  . Creatinine, Ser 05/03/2017 0.39* 0.44 - 1.00 mg/dL Final  . GFR calc non Af Amer 05/03/2017 >60  >60 mL/min Final  . GFR calc Af Amer 05/03/2017 >60  >60 mL/min Final   Comment: (NOTE) The eGFR has been calculated using the CKD EPI equation. This calculation has not been validated in all clinical situations. eGFR's persistently <60 mL/min signify possible Chronic Kidney Disease.     Assessment:  Michaela Broski is a 56 y.o. female with metastatic poorly differentiated adenocarcinoma of lung. She presented with a 5 month history of enlarging bilateral neck masses, progressive hoarseness, dysphagia, dyspnea on exertion, cough, left sided hearing loss, neck pain, and a 30 pound weight loss.  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 Biopsy of the right supraclavicular lymph node on 10/03/2014 revealed poorly differentiated non-small cell carcinoma, favoring adenocarcinoma consistent with lung primary (TTF-1 positive). KRAS was positive.   PET scan on 10/24/2014 revealed a left hilar mass with associated left upper lobe obstruction/atelectasis with extensive bilateral mediastinal and cervical adenopathy. Chest CT from 10/10/2014 also noted a 2.2 x 0.9 cm soft tissue density with internal calcifications in the right breast. Head MRI revealed no evidence of metastatic disease.  She received palliative radiation (3000 cGy) to the left lung from 10/24/2014 - 11/07/2014. She received 6 cycles carboplatin and Alimta (11/14/2014 - 04/30/2015). There was a delay between cycle #2 and cycle #3 secondary to a switch in caregivers.  She was diagnosed with a left peri-hilar infiltrate on 02/23/2015. She has  completed a course of Levaquin. Chest CT on 03/21/2015 revealed LUL scarring, patchy ill defined airspace opacities in the lingula, upper esophageal thickening, and small mediastinal and hilar nodes. She completed a course of Azithromycin.  She received 2 cycles of maintenance Alimta (05/22/2015 - 06/15/2015). Chest CT on 07/09/2015 revealed patchy ground glass and nodular consolidation in the left upper lobe and to a lesser extent left lower lobe, minimally progressive from 03/21/2015.  Bone scan on 07/09/2015 revealed no evidence of metastatic disease.  CXR on 09/24/2015 revealed patchy left perihilar airspace opacities.  She was treated with azithromycin.  Chest CT on 10/17/2015 revealed patchy ground-glass and ill-defined nodular areas of opacity in the left apex, lingula, and anterior left lower lobe generally appeared decreased in the interval although 1 of the nodular areas in the left apex has progressed slightly since the previous study.  Overall imaging features are felt to be most likely treatment related.  There had been an interval slight decrease in proximal to mid esophageal circumferential wall thickening, potentially treatment related.   She was lost to follow-up for approximately 3 months.  Chest and abdomen CT scan on 02/04/2016 revealed evolving radiation changes in the left upper and left lower lobes. Previously measured left upper lobe nodule was no longer visualized. There was no evidence of metastatic disease.  Right hilar lymph nodes were not considered enlarged by CT size criteria and are nonspecific, but appeared new from the prior exam.  Bone scan on 02/19/2016 revealed no definite evidence of metastatic disease. There was subtle uptake within the posterior aspects of the upper ribs bilaterally (  nonspecific and not entirely new).  There was a tiny focus of increased uptake near the vertex of the calvarium (new).  She has been hoarse since 02/01/2016.  She has ongoing  hoarseness and difficulty swallowing.  She has not had her ENT or swallowing evaluations.  She has had delays in therapy secondary to tooth abscesses.  She has had another delay in therapy secondary to "the flu".   She has received 6 cycles of maintenance Alimta (restarted 02/29/2016 - 06/06/2016; 08/01/2016; 10/31/2016).  She receives B12 every 9 weeks (last 12/23/2016) and is on daily folic acid.   CEA has been monitored: 3.3 on 02/29/2016, 4.1 on 03/28/2016, 4.2 on 04/17/2016, 5.0 on 08/22/2016 and 9.4 on 10/31/2016.  PET scan on 01/29/2017 revealed abnormal hypermetabolic adenopathy in the neck, chest, and upper abdomen with probable involvement of the left adrenal gland, suspicious for recurrent metastatic lung cancer.  There was stable sub solid nodularity in the left upper lobe with a new partially solid 7 mm nodule in the left lower lobe.  She was admitted to [facility] from 06/21/2016 - 06/22/2016 with severe shortness of breath secondary to possible aspiration.  Chest CT angiogram on 06/21/2016 revealed no evidence of pulmonary embolism . There was stable patchy peribronchovascular interstitial thickening, volume loss and patchy reticulation in the left upper lobe and central left lower lobe, presumably representing stable posttreatment change.  Bilateral lower extremity duplex revealed no evidence of thrombosis.  She was discharged on prednisone, Spiriva, and Levaquin.   She was admitted to [facility] from 11/07/2016 - 11/11/2016 with sepsis and hospital acquired pneumonia.  CXR on 11/09/2016 revealed developing right and possible left sided airspace opacities suspicious for pneumonia.  She had poor oral intake x 3-4 days secondary to nausea and vomiting.  She was treated with Levaquin.  She was admitted to [facility] from 03/12/2017 - 03/18/2017 with sepsis and a cavitary lung lesion.  She was discharged to Hospice.  Hospice was discontinued secondary to unsafe conditions per Hospice nursing.   She was  admitted to [facility] from 04/14/2017 - 04/17/2017 with adjustment disorder (mixed anxiety and depression), hypokalemia, shortness of breath and malnutrition.  While hospitalized, she decided to reinitiate treatment. She was initially going to a rehabilitation facility but changed her mind. She has panic attacks.  She was admitted to [facility] from 04/20/2017 - 04/21/2017 with generalized weakness and an episode of shortness of breath.   She has a history of hyperthyroidism.  She was on methimazole.  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  She has not seen endocrinology.  Symptomatically, she is feeling better.  Appetite is poor.  She wishes to restart therapy.  Plan: 1.  Labs today:  CBC with diff, CMP, CEA, ferritin. 2.  Discuss recent hospitalizations, living situation and desire to restart treatment.  Discuss restarting single agent Alimta (she did not document progression).  Discuss importance of taking folic acid.  Restart B12. 2.  Guardian 360 testing today. 3.  Contact pathology regarding original tissue to confirm testing:  EGFR, ALK, ROS1, BRAF, PDL-1. 4.  B12 injection today. 5.  Rx:  Megace 200 mg po BID. 6.  Rx:  Trazodone 50 mg po qhs prn insomnia (refill). 7.  Consult endocrinology (Dr Gabriel Carina) re:  thyroid disease. 8.  Confirm preauth Alimta. 9.   RTC on 05/05/2017 for MD assessment, labs (CBC with diff, CMP, CEA, ferritin) and Alimta on 05/06/2017.   Lequita Asal, MD  05/05/2017, 3:16 AM

## 2017-05-05 NOTE — Progress Notes (Signed)
Crushed meds and gave in ice cream.  She tolerated it well.  Had to reorient patient several times that meds were given in ice cream, but she finally accepted it.

## 2017-05-05 NOTE — Discharge Summary (Deleted)
[facility] [place] NAME: Audrey Mcgee    MR#:  [id number]  DATE OF BIRTH:  1961/03/13  DATE OF ADMISSION:  04/29/2017 ADMITTING PHYSICIAN: Harvie Bridge, DO  DATE OF DISCHARGE: 05/04/2017  PRIMARY CARE PHYSICIAN: Donnie Coffin, MD    ADMISSION DIAGNOSIS:  Shortness of breath [R06.02] COPD exacerbation (HCC) [J44.1]  DISCHARGE DIAGNOSIS:  Principal Problem:   Chest pain Active Problems:   Cancer related pain   COPD exacerbation (HCC)   Sinus tachycardia   Weight loss   Metastatic lung cancer (metastasis from lung to other site) (HCC)   Elevated troponin   Chest pain   SECONDARY DIAGNOSIS:   Past Medical History:  Diagnosis Date  . COPD (chronic obstructive pulmonary disease) ([place])   . Dementia   . Hypertension   . Lung cancer ([place])   . 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  1. Chest pain: Patient has been ruled out for non-ST elevation MI. Chest pain is likely due to metastatic cancer CT chest showed no pulmonary emboli  2. History of lung cancer: Not a candidate for further treatment She would benefit from hospice although in the past has refused  3. Sinus tachycardia: Continue metoprolol  4. Protein calorie malnutrition: Continue boost supplements and Megace  5. Hypothyroidism: Continue Tapazole 6. 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 She would benefit from Palliative care   DISCHARGE CONDITIONS AND DIET:   Stable Regular diet  CONSULTS OBTAINED:  Treatment Team:  Clapacs, Madie Reno, MD  DRUG ALLERGIES:   Allergies  Allergen Reactions  . No Known Allergies     DISCHARGE MEDICATIONS:   Current Discharge Medication List    START taking these medications   Details  aspirin EC 81 MG EC tablet Take 1 tablet (81 mg total) by mouth  daily. Qty: 30 tablet, Refills: 2      CONTINUE these medications which have NOT CHANGED   Details  ALPRAZolam (XANAX) 0.25 MG tablet Take 1 tablet (0.25 mg total) by mouth 3 (three) times daily as needed for anxiety. Qty: 20 tablet, Refills: 0    docusate sodium (COLACE) 100 MG capsule Take 100 mg by mouth 2 (two) times daily as needed for mild constipation.    feeding supplement (BOOST / RESOURCE BREEZE) LIQD Take 1 Container by mouth 2 (two) times daily between meals. Qty: 90 Container, Refills: 6    folic acid (FOLVITE) 568 MCG tablet Take 800 mcg by mouth daily.     ipratropium-albuterol (DUONEB) 0.5-2.5 (3) MG/3ML SOLN Take 3 mLs by nebulization every 4 (four) hours. Qty: 360 mL, Refills: 5    levothyroxine (SYNTHROID, LEVOTHROID) 100 MCG tablet Take 100 mcg by mouth daily before breakfast.  Refills: 0    lidocaine (XYLOCAINE) 2 % solution Use as directed 10 mLs in the mouth or throat every 4 (four) hours as needed for mouth pain.    magnesium oxide (MAG-OX) 400 MG tablet Take 1 tablet (400 mg total) by mouth 2 (two) times daily. Qty: 30 tablet, Refills: 0    megestrol (MEGACE) 40 MG/ML suspension Take 5 mLs (200 mg total) by mouth 2 (two) times daily. 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 Qty: 30 tablet, Refills: 0    metoprolol (LOPRESSOR) 50 MG tablet Take 0.5 tablets (25 mg total) by mouth 2 (two) times daily. Qty: 30 tablet, Refills: 0    Morphine Sulfate (MORPHINE CONCENTRATE) 10 mg / 0.5 ml concentrated solution Take 0.5 mLs (10 mg total) by mouth every 4 (four) hours as needed for severe pain. Qty: 30 mL, Refills: 0    omeprazole (PRILOSEC) 20 MG capsule Take 1 capsule (20 mg total) by mouth daily. Qty: 30 capsule,  Refills: 3    ondansetron (ZOFRAN) 4 MG tablet Take 1 tablet (4 mg total) by mouth every 6 (six) hours as needed for nausea. Qty: 20 tablet, Refills: 0    potassium chloride 20 MEQ TBCR Take 1 tab twice a day Qty: 60 tablet, Refills: 0    thiamine (VITAMIN B-1) 100 MG tablet Take 1 tablet (100 mg total) by mouth daily. Qty: 30 tablet, Refills: 1    tiotropium (SPIRIVA) 18 MCG inhalation capsule Place 1 capsule (18 mcg total) into inhaler and inhale daily. Qty: 30 capsule, Refills: 1    traZODone (DESYREL) 50 MG tablet Take 1 tablet (50 mg total) by mouth at bedtime as needed for sleep. 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 rate 16, height '5\' 4"'$  (1.626 m), weight 51.1 kg (112 lb 9.6 oz), last menstrual period 01/30/2008, SpO2 100 %.   REVIEW OF SYSTEMS:  Review of Systems  Constitutional: Positive for malaise/fatigue. Negative for chills and fever.  HENT: Negative.  Negative for ear discharge, ear pain, hearing loss, nosebleeds and sore throat.   Eyes: Negative.  Negative for blurred vision and pain.  Respiratory: Negative.  Negative for cough, hemoptysis, shortness of breath and wheezing.   Cardiovascular: Negative.  Negative for chest pain, palpitations and leg swelling.  Gastrointestinal: Negative.  Negative for abdominal pain, blood in stool, diarrhea, nausea and vomiting.  Genitourinary: Negative.  Negative for dysuria.  Musculoskeletal: Negative.  Negative for back pain.  Skin: Negative.   Neurological:  Negative for dizziness, tremors, speech change, focal weakness, seizures and headaches.  Endo/Heme/Allergies: Negative.  Does not bruise/bleed easily.  Psychiatric/Behavioral: Positive for memory loss. Negative for depression, hallucinations and suicidal ideas.     PHYSICAL EXAMINATION:  GENERAL:  56 y.o.-year-old patient lying in the bed with no acute distress. Thin frail NECK:  Supple, no jugular venous distention. No thyroid enlargement, no tenderness.  LUNGS: Normal breath sounds bilaterally, no wheezing, rales,rhonchi  No use of accessory muscles of respiration.  CARDIOVASCULAR: S1, S2 normal. No murmurs, rubs, or gallops.  ABDOMEN: Soft, non-tender, non-distended. Bowel sounds present. No organomegaly or mass.  EXTREMITIES: No pedal edema, cyanosis, or clubbing.  PSYCHIATRIC: The patient is alert and oriented x 3.  SKIN: No obvious rash, lesion, or ulcer.   DATA REVIEW:   CBC  Recent Labs Lab 04/30/17 0406  WBC 8.3  HGB 9.8*  HCT 29.4*  PLT 225    Chemistries   Recent Labs Lab 04/28/17 2249 04/29/17 1756 05/03/17 0528  NA 139 137  --   K 4.0 4.2  --   CL 104 105  --   CO2 22 24  --   GLUCOSE 170* 133*  --   BUN 5* 6  --   CREATININE 0.65 0.61 0.39*  CALCIUM 9.2 8.6*  --   AST 41  --   --   ALT 9*  --   --   ALKPHOS 82  --   --   BILITOT 1.6*  --   --     Cardiac Enzymes  Recent Labs Lab 04/29/17 2206 04/30/17 0102 04/30/17 0406  TROPONINI 0.25* 0.25* 0.25*    Microbiology Results  '@[id number]'$ @  RADIOLOGY:  No results found.    Current Discharge Medication List    START taking these medications   Details  aspirin EC 81 MG EC tablet Take 1 tablet (81 mg total) by mouth daily. Qty: 30 tablet, Refills: 2      CONTINUE these medications which have NOT CHANGED   Details  ALPRAZolam (XANAX) 0.25 MG tablet Take 1 tablet (0.25 mg total) by mouth 3 (three) times daily as needed for anxiety. Qty: 20 tablet, Refills: 0    docusate sodium  (COLACE) 100 MG capsule Take 100 mg by mouth 2 (two) times daily as needed for mild constipation.    feeding supplement (BOOST / RESOURCE BREEZE) LIQD Take 1 Container by mouth 2 (two) times daily between meals. Qty: 90 Container, Refills: 6    folic acid (FOLVITE) 322 MCG tablet Take 800 mcg by mouth daily.     ipratropium-albuterol (DUONEB) 0.5-2.5 (3) MG/3ML SOLN Take 3 mLs by nebulization every 4 (four) hours. Qty: 360 mL, Refills: 5    levothyroxine (SYNTHROID, LEVOTHROID) 100 MCG tablet Take 100 mcg by mouth daily before breakfast.  Refills: 0    lidocaine (XYLOCAINE) 2 % solution Use as directed 10 mLs in the mouth or throat every 4 (four) hours as needed for mouth pain.    magnesium oxide (MAG-OX) 400 MG tablet Take 1 tablet (400 mg total) by mouth 2 (two) times daily. Qty: 30 tablet, Refills: 0    megestrol (MEGACE) 40 MG/ML suspension Take 5 mLs (200 mg total) by mouth 2 (two) times daily. 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 Qty: 30 tablet, Refills: 0    metoprolol (LOPRESSOR) 50 MG tablet Take 0.5 tablets (25 mg total) by mouth 2 (two) times daily. Qty: 30 tablet, Refills: 0    Morphine Sulfate (MORPHINE CONCENTRATE) 10 mg / 0.5 ml concentrated solution Take 0.5 mLs (10 mg total) by mouth every 4 (four) hours as needed for severe pain. Qty: 30 mL, Refills: 0    omeprazole (PRILOSEC) 20 MG capsule Take 1 capsule (20 mg total) by mouth daily. Qty: 30 capsule, Refills: 3    ondansetron (ZOFRAN) 4 MG tablet Take 1 tablet (4 mg total) by mouth every 6 (six) hours as needed for nausea. Qty: 20 tablet, Refills: 0    potassium chloride 20 MEQ TBCR Take 1 tab twice a day Qty: 60 tablet, Refills: 0    thiamine (VITAMIN B-1) 100  MG tablet Take 1 tablet (100 mg total)  by mouth daily. Qty: 30 tablet, Refills: 1    tiotropium (SPIRIVA) 18 MCG inhalation capsule Place 1 capsule (18 mcg total) into inhaler and inhale daily. Qty: 30 capsule, Refills: 1    traZODone (DESYREL) 50 MG tablet Take 1 tablet (50 mg total) by mouth at bedtime as needed for sleep. 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 Stable for discharge   Patient should follow up with pcp  CODE STATUS:     Code Status Orders        Start     Ordered   04/29/17 2218  Full code  Continuous     04/29/17 2217    Code Status History    Date Active Date Inactive Code Status Order ID Comments User Context   04/21/2017  2:16 AM 04/21/2017  4:48 PM Full Code [id number]  Saundra Shelling, MD Inpatient   04/14/2017  2:54 PM 04/18/2017  5:03 PM Full Code [id number]  Bettey Costa, MD Inpatient   03/13/2017  2:15 AM 03/18/2017  6:52 PM Full Code [id number]  Lance Coon, MD Inpatient   03/02/2017 11:01 PM 03/04/2017  3:20 PM Full Code [id number]  Lance Coon, MD Inpatient   02/07/2017 10:26 AM 02/21/2017  8:14 PM Full Code [id number]  Saundra Shelling, MD Inpatient   11/07/2016  6:18 AM 11/11/2016  2:39 PM Full Code [id number]  Harrie Foreman, MD Inpatient   06/21/2016 11:52 AM 06/22/2016  3:07 PM Full Code [id number]  [name], MD Inpatient      TOTAL TIME TAKING CARE OF THIS PATIENT: 48. minutes.    Note: This dictation was prepared with Dragon dictation along with smaller phrase technology. Any transcriptional errors that result from this process are unintentional.  Maira Christon M.D on  05/05/2017 at 7:51 AM  Between 7am to 6pm - Pager - [phone] After 6pm go to [email or web address] - password [email or web address] [facility]  Office  [phone]  CC: Primary care physician; Donnie Coffin, MD

## 2017-05-05 NOTE — Progress Notes (Signed)
Medication found in patients bed when she went to the bathroom.  It has been reported that this is a regular occurrence.  Said nurse will crush meds and give in ice cream.

## 2017-05-05 NOTE — Progress Notes (Signed)
[facility] [place] NAME: Audrey Mcgee    MR#:  [id number]  DATE OF BIRTH:  [birth year]  SUBJECTIVE:   No acute events overnight. Patient asking me to help her get through her cancer  REVIEW OF SYSTEMS:    Review of Systems  Constitutional: Negative for fever, chills weight loss HENT: Negative for ear pain, nosebleeds, congestion, facial swelling, rhinorrhea, neck pain, neck stiffness and ear discharge.   Respiratory: Negative for cough, shortness of breath, wheezing  Cardiovascular: Negative for chest pain, palpitations and leg swelling.  Gastrointestinal: Negative for heartburn, abdominal pain, vomiting, diarrhea or consitpation Genitourinary: Negative for dysuria, urgency, frequency, hematuria Musculoskeletal: Negative for back pain or joint pain Neurological: Negative for dizziness, seizures, syncope, focal weakness,  numbness and headaches.  Hematological: Does not bruise/bleed easily.  Psychiatric/Behavioral: She has some mild dementia   Tolerating Diet: yes      DRUG ALLERGIES:   Allergies  Allergen Reactions  . No Known Allergies     VITALS:  Blood pressure 110/73, pulse 68, temperature 98.5 F (36.9 C), temperature source Oral, resp. rate 16, height '5\' 4"'$  (1.626 m), weight 51.1 kg (112 lb 9.6 oz), last menstrual period 01/30/2008, SpO2 100 %.  PHYSICAL EXAMINATION:  Constitutional: AppearsVery thin and frail HENT: Normocephalic. [name] Oropharynx is clear and moist.  Eyes: Conjunctivae and EOM are normal. PERRLA, no scleral icterus.  Neck: Normal ROM. Neck supple. No JVD. No tracheal deviation. CVS: RRR, S1/S2 +, no murmurs, no gallops, no carotid bruit.  Pulmonary: Effort and breath sounds normal, no stridor, rhonchi, wheezes, rales.  Abdominal: Soft. BS +,  no distension, tenderness, rebound or guarding.  Musculoskeletal: Normal range of motion. No edema and no tenderness.  Neuro: Alert. CN 2-12 grossly intact. No  focal deficits. Oriented to name place not time Skin: Skin is warm and dry. No rash noted. Psychiatric: sad affect    LABORATORY PANEL:   CBC  Recent Labs Lab 04/30/17 0406  WBC 8.3  HGB 9.8*  HCT 29.4*  PLT 225   ------------------------------------------------------------------------------------------------------------------  Chemistries   Recent Labs Lab 04/28/17 2249 04/29/17 1756 05/03/17 0528  NA 139 137  --   K 4.0 4.2  --   CL 104 105  --   CO2 22 24  --   GLUCOSE 170* 133*  --   BUN 5* 6  --   CREATININE 0.65 0.61 0.39*  CALCIUM 9.2 8.6*  --   AST 41  --   --   ALT 9*  --   --   ALKPHOS 82  --   --   BILITOT 1.6*  --   --    ------------------------------------------------------------------------------------------------------------------  Cardiac Enzymes  Recent Labs Lab 04/29/17 2206 04/30/17 0102 04/30/17 0406  TROPONINI 0.25* 0.25* 0.25*   ------------------------------------------------------------------------------------------------------------------  RADIOLOGY:  No results found.   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  1. Chest pain: Patient has been ruled out for non-ST elevation MI. Chest pain is  due to metastatic cancer CT chest showed no pulmonary emboli  2. History of lung cancer: Not a candidate for further treatmentAs per oncology She would benefit from hospice although in the past has refused  3. Sinus tachycardia: Continue metoprolol  4. Protein calorie malnutrition: Continue boost supplements and Megace  5. Hypothyroidism: Continue Tapazole 6. 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  Palliative care consult pending  Critical social worker assisting with disposition planning Patient awaiting guardian ship Management plans discussed with the patient and she is in  agreement.  CODE STATUS: full TOTAL TIME TAKING CARE OF THIS PATIENT: 26 minutes.     POSSIBLE D/C ??, DEPENDING ON CLINICAL CONDITION.   Audrey Mcgee M.D on 05/05/2017 at 7:52 AM  Between 7am to 6pm - Pager - [phone] After 6pm go to [email or web address] - password [email or web address] [facility]  Office  [phone]  CC: Primary care physician; Donnie Coffin, MD  Note: This dictation was prepared with Dragon dictation along with smaller phrase technology. Any transcriptional errors that result from this process are unintentional.

## 2017-05-05 NOTE — Progress Notes (Signed)
Nutrition Follow-up  DOCUMENTATION CODES:   Severe malnutrition in context of chronic illness  INTERVENTION:  Continue Carnation Instant Breakfast in whole milk TID on each trays, provides 280 kcal and 13 grams of protein.  Continue Boost Breeze po BID, each supplement provides 250 kcal and 9 grams of protein.   Ordered snacks BID for patient (2pm, 8pm).   NUTRITION DIAGNOSIS:   Malnutrition (Severe) related to chronic illness (Stg IV Lung CA) as evidenced by severe depletion of muscle mass, severe depletion of body fat, percent weight loss.  Ongoing.  GOAL:   Patient will meet greater than or equal to 90% of their needs  Progressing.  MONITOR:   I & O's, Labs, Weight trends, Supplement acceptance, PO intake  REASON FOR ASSESSMENT:   Malnutrition Screening Tool    ASSESSMENT:   Audrey Mcgee is a 56 y.o. female with a known history of lung cancer with mets, COPD, dementia, HTN, hyperthyroidism presents to the emergency department for evaluation of SOB and chest pain.  -Chest pain from metastatic cancer. No pulmonary emboli per CT chest. MI ruled out. -Pending PMT consult. -Per chart patient awaiting guardianship. Per psychiatrist patient lacks capacity to make decisions. She was previously discharged from home home hospice services due to unsafe environment.  Spoke with patient at bedside. She reports she is not feeling good. She endorses ongoing nausea and poor appetite. Patient reports she is able to finish parts of her tray for 1-2 meals per day. She is requesting snacks between meals because she thinks having something on her stomach will help with her nausea. She would like to have peanut butter crackers and orange juice for 2pm snack. For bed time snack she is requesting 1/2 sandwich, cookies, and chocolate milk. She reports she cannot sleep and gets very hungry late at night after the kitchen has closed. She reports this worsens her nausea the next day. Patient  unsure if she is receiving CIB on tray. Noted it was ordered on all trays and patient had received her milk, but did not see the CIB package on tray in room.  Meal Completion: mainly 5-25% this admission. Per chart patient had 75% of dinner last night (approximately 542 kcal, 25 grams of protein). Patient had finished eggs, sausage, orange juice, 50% of Magic Cup from breakfast tray present in room at time of assessment (305 kcal, 15 grams of protein). Had also finished approximately 50% of Boost Breeze on table (125 kcal, 4.5 grams of protein).  Medications reviewed and include: folic acid 1 mg daily, levothyroxine, magnesium oxide 400 mg BID, Megace, pantoprazole, prednisone, thiamine 100 mg daily, morphine PRN.  Labs reviewed: Creatinine 0.69 (increased from 0.39 on 5/13).  Weight trend: 51.1 kg on 5/11 - significantly higher from previous weights so may not be accurate  Diet Order:  Diet - low sodium heart healthy Diet regular Room service appropriate? Yes; Fluid consistency: Thin  Skin:  Reviewed, no issues  Last BM:  05/01/2017  Height:   Ht Readings from Last 1 Encounters:  05/01/17 '5\' 4"'$  (1.626 m)    Weight:   Wt Readings from Last 1 Encounters:  05/01/17 112 lb 9.6 oz (51.1 kg)    Ideal Body Weight:  54.54 kg  BMI:  Body mass index is 19.33 kg/m.  Estimated Nutritional Needs:   Kcal:  1500-1750 calories  Protein:  68-79 gm  Fluid:  >/= 1.5L  EDUCATION NEEDS:   No education needs identified at this time  Willey Blade, MS, RD,  LDN Pager: [phone] After Hours Pager: [phone]

## 2017-05-06 ENCOUNTER — Ambulatory Visit: Payer: Medicaid Other

## 2017-05-06 MED ORDER — ALPRAZOLAM 0.25 MG PO TABS: 0.2500 mg | ORAL_TABLET | Freq: Three times a day (TID) | ORAL | 0 refills | Status: AC | PRN

## 2017-05-06 MED ORDER — MORPHINE SULFATE (CONCENTRATE) 10 MG /0.5 ML PO SOLN: 10.0000 mg | ORAL | 0 refills | Status: DC | PRN

## 2017-05-06 NOTE — Progress Notes (Signed)
[facility] [place] NAME: Audrey Mcgee    MR#:  [id number]  DATE OF BIRTH:  1961-04-19  SUBJECTIVE:   Patient was having back pain  REVIEW OF SYSTEMS:    Review of Systems  Constitutional: Negative for fever, chills weight loss HENT: Negative for ear pain, nosebleeds, congestion, facial swelling, rhinorrhea, neck pain, neck stiffness and ear discharge.   Respiratory: Negative for cough, shortness of breath, wheezing  Cardiovascular: Negative for chest pain, palpitations and leg swelling.  Gastrointestinal: Negative for heartburn, abdominal pain, vomiting, diarrhea or consitpation Genitourinary: Negative for dysuria, urgency, frequency, hematuria Musculoskeletal:resolved back pain NO joint pain Neurological: Negative for dizziness, seizures, syncope, focal weakness,  numbness and headaches.  Hematological: Does not bruise/bleed easily.  Psychiatric/Behavioral: sad affect  Tolerating Diet: yes      DRUG ALLERGIES:   Allergies  Allergen Reactions  . No Known Allergies     VITALS:  Blood pressure 127/85, pulse 86, temperature 97.8 F (36.6 C), temperature source Oral, resp. rate 16, height '5\' 4"'$  (1.626 m), weight 51.1 kg (112 lb 9.6 oz), last menstrual period 01/30/2008, SpO2 100 %.  PHYSICAL EXAMINATION:  Constitutional: AppearsVery thin and frail HENT: Normocephalic. [name] Oropharynx is clear and moist.  Eyes: Conjunctivae and EOM are normal. PERRLA, no scleral icterus.  Neck: Normal ROM. Neck supple. No JVD. No tracheal deviation. CVS: RRR, S1/S2 +, no murmurs, no gallops, no carotid bruit.  Pulmonary: Effort and breath sounds normal, no stridor, rhonchi, wheezes, rales.  Abdominal: Soft. BS +,  no distension, tenderness, rebound or guarding.  Musculoskeletal: Normal range of motion. No edema and no tenderness.  Neuro: Alert. CN 2-12 grossly intact. No focal deficits. Oriented to name place not time Skin: Skin is warm and  dry. No rash noted. Psychiatric: sad affect    LABORATORY PANEL:   CBC  Recent Labs Lab 04/30/17 0406  WBC 8.3  HGB 9.8*  HCT 29.4*  PLT 225   ------------------------------------------------------------------------------------------------------------------  Chemistries   Recent Labs Lab 05/05/17 0911  NA 139  K 3.8  CL 105  CO2 30  GLUCOSE 113*  BUN 9  CREATININE 0.69  CALCIUM 8.5*   ------------------------------------------------------------------------------------------------------------------  Cardiac Enzymes  Recent Labs Lab 04/29/17 2206 04/30/17 0102 04/30/17 0406  TROPONINI 0.25* 0.25* 0.25*   ------------------------------------------------------------------------------------------------------------------  RADIOLOGY:  No results found.   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  1. Chest pain: Patient has been ruled out for non-ST elevation MI. Chest pain is  due to metastatic cancer CT chest showed no pulmonary emboli  2. History of lung cancer: Not a candidate for further treatment As per oncology She would benefit from hospice at SNF 3. Sinus tachycardia: Continue metoprolol  4. Protein calorie malnutrition: Continue boost supplements and Megace  5. Hypothyroidism: Continue Tapazole 6. 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   She will have PC/perhaps hospice at [place] with disposition planning Patient awaiting guardian ship Management plans discussed with the patient and she is in agreement.  CODE STATUS: full TOTAL TIME TAKING CARE OF THIS PATIENT: 22 minutes.     POSSIBLE D/C ??, DEPENDING ON CSW   Yousra Ivens M.D on 05/06/2017 at 11:11 AM  Between 7am to 6pm - Pager - [phone] After 6pm go to [email or web address] - Proofreader  [employer]  [phone]  CC: Primary care physician; Donnie Coffin, MD  Note: This dictation was prepared with Dragon dictation along with smaller phrase technology. Any transcriptional errors that result from this process are unintentional.

## 2017-05-06 NOTE — Plan of Care (Signed)
Problem: Education: Goal: Knowledge of [facility] Education information/materials will improve Outcome: Progressing VSS, free of falls during shift.  Reports persistent upper back pain 7-9/10, received PRN PO Roxanol '10mg'$  x1, PRN PO Percocet 5-'325mg'$  x1.  Pt requested sleep med, anxiolytic, received PRN PO Ambien '5mg'$ , PRN PO Xanax 0.'25mg'$  x2.  Pt very tearful at times during shift, states she has been unable to reach her daughter.  RN provided emotional support, encouragement.  No other needs overnight.  Bed in low position, call bell within reach.  WCTM.

## 2017-05-07 MED ORDER — BISACODYL 10 MG RE SUPP
10.0000 mg | Freq: Once | RECTAL | Status: AC
  Administered 2017-05-07: 15:00:00 10 mg via RECTAL
  Filled 2017-05-07: qty 1

## 2017-05-07 NOTE — Clinical Social Work Note (Signed)
CSW updated MD. CSW called pt's daughter, [name] at 62 and left a message requesting a returning phone call.   1149:  Called pt's daughter, [name], and left a message requesting a return phone call.  1150:  Called pt's daughter, [name] and was unable to leave a message.  1151:  Called pt's brother, Vicente Serene, and left a message requesting a return phone call.   1152:  Called the [employer] for a safety check at [employer] ([address] [place], [place] [address].   After numerous attempts through out the week to reach family to address address discharge plan, CSW asked [place] to go to pt's daughter's home to have daughter make contact with CSW. CSW will continue follow.   Darden Dates, MSW, LCSW Clinical Social Worker  [phone]

## 2017-05-07 NOTE — Plan of Care (Signed)
Problem: Nutrition: Goal: Adequate nutrition will be maintained Outcome: Not Progressing Pt continues not not eat meals but is saving in used containers in her room.  Has at least one patient belonging bag of saved food in room.  Problem: Bowel/Gastric: Goal: Will not experience complications related to bowel motility Outcome: Not Progressing Pt has not had BM in 6 days .  Dulcolax suppository given.

## 2017-05-07 NOTE — Clinical Social Work Note (Signed)
LAT ENTRY  Pt has bed offers for a SNF bed. CSW has reached out pt's daughters numerous times and left messages on 05/06/2017. CSW will contineue to atempt. MD is aware. CSW will continue to follow.   Darden Dates, MSW, LCSW  Clinical Social Worker  [phone]

## 2017-05-07 NOTE — Progress Notes (Signed)
Received call from [place] to call EMS for transport.  The family had signed needed paperwork at [facility].  Attempted to call report and was told pt's family had not come to sign papers and they would not take the patient tonight.  Left message for CSW Darden Dates and Evette Cristal about family not signing and pt not being discharged.

## 2017-05-07 NOTE — Plan of Care (Addendum)
Problem: Education: Goal: Knowledge of [facility] Education information/materials will improve Outcome: Progressing VSS, free of falls during shift.  Pt reported PIV site painful, burning when flushed.  Dr. Jannifer Franklin paged re: PIV need, ok to remove PIV and not replace unless IV access needed in future for meds/fluids.  Pt very anxious early in shift, received PRN PO Xanax 0.'25mg'$  x1.  Pt requested sleep aid, received PRN PO Ambien '5mg'$  x1.  Pt reported upper chest pain 8/10, received PRN PO Percocet 5-'325mg'$  x1.  No other needs overnight, pt able to sleep calmly during shift.  Bed in low position, call bell within reach.  WCTM.

## 2017-05-07 NOTE — Clinical Social Work Note (Addendum)
5:15pm CSW received phone call from [place] in admissions from [facility] who said patient's family have arrived and are completing paperwork and SNF can accept patient tonight.  CSW contacted bedside nurse who will call and schedule EMS.   Patient to be d/c'ed today to [facility].  Patient and family agreeable to plans will transport via ems RN to call report to [place].  Evette Cristal, MSW, [place]

## 2017-05-07 NOTE — Clinical Social Work Note (Addendum)
CSW received phone call from patient's daughter who said she is on her way to [facility] to fill out required paperwork.  CSW was also asked by patient's daughter to have patient's things bagged up before other family members bring clothes for her.  CSW contacted bedside nurse to ask if patient's items can be packed up before family arrives with her clothes per request by patient's daughter.  CSW was informed by [place] that patient's family are on their way, and [place] will contact CSW once patient's daughter has arrived and paperwork has been started, patient's family is requesting EMS transport.  Jones Broom. Norval Morton, MSW, [place]  05/07/2017 3:43 PM

## 2017-05-07 NOTE — Progress Notes (Signed)
Audrey Mcgee at Aud[facility] Mc[facility] NAME: Audrey Mcgee    MR#:  [id number]  DATE OF BIRTH:  04-Nov-1961  SUBJECTIVE:   No acute events overnight.   REVIEW OF SYSTEMS:    Review of Systems  Constitutional: Negative for fever, chills weight loss HENT: Negative for ear pain, nosebleeds, congestion, facial swelling, rhinorrhea, neck pain, neck stiffness and ear discharge.   Respiratory: Negative for cough, shortness of breath, wheezing  Cardiovascular: Negative for chest pain, palpitations and leg swelling.  Gastrointestinal: Negative for heartburn, abdominal pain, vomiting, diarrhea or consitpation Genitourinary: Negative for dysuria, urgency, frequency, hematuria Musculoskeletal:resolved back pain NO joint pain Neurological: Negative for dizziness, seizures, syncope, focal weakness,  numbness and headaches.  Hematological: Does not bruise/bleed easily.  Psychiatric/Behavioral: sad affect  Tolerating Diet: yes      DRUG ALLERGIES:   Allergies  Allergen Reactions  . No Known Allergies     VITALS:  Blood pressure 132/89, pulse 86, temperature 97.4 F (36.3 C), temperature source Oral, resp. rate 18, height '5\' 4"'$  (1.626 m), weight 46.9 kg (103 lb 4.8 oz), last menstrual period 01/30/2008, SpO2 100 %.  PHYSICAL EXAMINATION:  Constitutional: AppearsVery thin and frail HENT: Normocephalic. [name] Oropharynx is clear and moist.  Eyes: Conjunctivae and EOM are normal. PERRLA, no scleral icterus.  Neck: Normal ROM. Neck supple. No JVD. No tracheal deviation. CVS: RRR, S1/S2 +, no murmurs, no gallops, no carotid bruit.  Pulmonary: Effort and breath sounds normal, no stridor, rhonchi, wheezes, rales.  Abdominal: Soft. BS +,  no distension, tenderness, rebound or guarding.  Musculoskeletal: Normal range of motion. No edema and no tenderness.  Neuro: Alert. CN 2-12 grossly intact. No focal deficits. Oriented to name place not time Skin: Skin is warm and dry.  No rash noted. Psychiatric: sad affect    LABORATORY PANEL:   CBC No results for input(s): WBC, HGB, HCT, PLT in the last 168 hours. ------------------------------------------------------------------------------------------------------------------  Chemistries   Recent Labs Lab 05/05/17 0911  NA 139  K 3.8  CL 105  CO2 30  GLUCOSE 113*  BUN 9  CREATININE 0.69  CALCIUM 8.5*   ------------------------------------------------------------------------------------------------------------------  Cardiac Enzymes No results for input(s): TROPONINI in the last 168 hours. ------------------------------------------------------------------------------------------------------------------  RADIOLOGY:  No results found.   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  1. Chest pain: Patient has been ruled out for non-ST elevation MI. Chest pain is  due to metastatic cancer CT chest showed no pulmonary emboli  2. History of metastatic poorly differentiated adenocarcinoma of lung: Not a candidate for further treatment As per oncology She would benefit from hospice at SNF  3. Sinus tachycardia: Continue metoprolol  4. Protein calorie malnutrition: Continue boost supplements and Megace  5. Hypothyroidism: Continue Tapazole 6. 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   She will have palliative care/perhaps hospice at [facility] with disposition planning  Management plans discussed with the patient and she is in agreement.  CODE STATUS: full TOTAL TIME TAKING CARE OF THIS PATIENT: 22 minutes.     POSSIBLE D/C ??, DEPENDING ON CSW   Audrey Mcgee M.D on 05/07/2017 at 10:40 AM  Between 7am to 6pm - Pager - [phone] After 6pm go to [email or web address] - password [email or web address] [facility]  Office   [phone]  CC: Primary care physician; Audrey Coffin, MD  Note:  This dictation was prepared with Dragon dictation along with smaller phrase technology. Any transcriptional errors that result from this process are unintentional.

## 2017-05-08 LAB — CREATININE, SERUM
Creatinine, Ser: 0.61 mg/dL (ref 0.44–1.00)
GFR calc Af Amer: >60 mL/min (ref >60)
GFR calc non Af Amer: >60 mL/min (ref >60)

## 2017-05-08 NOTE — Progress Notes (Signed)
Pt for discharge  To [facility].  Alert. No resp distress.  Report called to  [place] with  Nurse at facility. Pt to go to room 206. / 200 hall/ ems called to transport pt  Also.

## 2017-05-08 NOTE — Progress Notes (Signed)
Attempted to call report  For pt. The person answering phone  Could not  Transfer me and took my name and number  To have someone from [address] hall to call me back.

## 2017-05-08 NOTE — Clinical Social Work Placement (Signed)
   CLINICAL SOCIAL WORK PLACEMENT  NOTE  Date:  05/07/2016  Patient Details  Name: Audrey Mcgee MRN: [id number] Date of Birth: 1961/07/07  Clinical Social Work is seeking post-discharge placement for this patient at the [place] level of care (*CSW will initial, date and re-position this form in  chart as items are completed):  Yes   Patient/family provided with [place] Work Department's list of facilities offering this level of care within the geographic area requested by the patient (or if unable, by the patient's family).  Yes   Patient/family informed of their freedom to choose among providers that offer the needed level of care, that participate in Medicare, Medicaid or managed care program needed by the patient, have an available bed and are willing to accept the patient.  Yes   Patient/family informed of [facility]'s ownership interest in [facility] and [facility], as well as of the fact that they are under no obligation to receive care at these facilities.  PASRR submitted to EDS on       PASRR number received on       Existing PASRR number confirmed on 05/07/17     FL2 transmitted to all facilities in geographic area requested by pt/family on 05/06/17     FL2 transmitted to all facilities within larger geographic area on       Patient informed that his/her managed care company has contracts with or will negotiate with certain facilities, including the following:        Yes   Patient/family informed of bed offers received.  Patient chooses bed at [facility]     Physician recommends and patient chooses bed at      Patient to be transferred to [facility] on 05/07/17.  Patient to be transferred to facility by [facility]     Patient family notified on 05/07/17 of transfer.  Name of family member notified:  Patient's daughter Tiane     PHYSICIAN Please sign FL2     Additional Comment:     _______________________________________________ Ross Ludwig, LCSWA 05/07/17, 5:15pm

## 2017-05-08 NOTE — Clinical Social Work Note (Signed)
CSW received phone call from [facility] [place] [phone], SNF who said there was a mix up with the nurse at the SNF.  Patient's paperwork was completed, and the nurse at SNF was not aware.  Audrey Mcgee at [facility] said patient can come today after 11am, via EMS, CSW updated bedside nurse and physician.  Audrey Mcgee at [facility] also updated patient's daughter [name].  Patient to be d/c'ed today to [facility].  Patient and family agreeable to plans will transport via ems RN to call report to 200 hall nurse [place], MSW, [place]

## 2017-05-08 NOTE — Progress Notes (Signed)
Pt left at this time via ems  For [facility]. No distress or c/o.

## 2017-05-08 NOTE — Progress Notes (Signed)
VSS, free of falls during shift.  Reports continued back/chest pain 7-8/10, received PRN PO Percocet 5-'325mg'$  x2, PRN PO Roxanol '10mg'$  x1.  Anxious, restless during shift, received PRN PO Xanax 0.'25mg'$  x1, PRN PO Ambien '5mg'$  x1.  Pt does not sleep for long periods, but was visualized sleeping during shift.  Pt reported leg swelling/pain, no swelling noted on assessment.  Encouraged pt to speak w/ MD during rounds.  No other complaints overnight.  Bed in low position, call bell within reach.  WCTM.

## 2017-05-09 NOTE — ED Provider Notes (Signed)
[facility] Emergency Department Provider Note    First MD Initiated Contact with Patient 04/22/17 [phone]     (approximate)  I have reviewed the triage vital signs and the nursing notes.   HISTORY  Chief Complaint Shortness of Breath    HPI Audrey Mcgee is a 56 y.o. female with below list of chronic medical conditions including metastatic lung cancer presents to the emergency department via EMS with complaint of dyspnea. Patient states that she had a disagreement with her daughter tonight which resulted in her dyspnea. Patient denies any chest pain. Patient denies any fever no lower extremity pain or swelling.   Past Medical History:  Diagnosis Date  . COPD (chronic obstructive pulmonary disease) ([place])   . Dementia   . Hypertension   . Lung cancer ([place])   . Thyroid disease     Patient Active Problem List   Diagnosis Date Noted  . Chest pain 04/30/2017  . Dementia with behavioral disturbance 04/30/2017  . Elevated troponin 04/29/2017  . Failure to thrive (0-17) 04/20/2017  . Adjustment disorder with mixed anxiety and depressed mood 04/16/2017  . SOB (shortness of breath)   . Protein-calorie malnutrition ([place])   . HCAP (healthcare-associated pneumonia) 03/12/2017  . Hyperthyroidism 03/02/2017  . COPD (chronic obstructive pulmonary disease) ([place]) 03/02/2017  . Subacute delirium 02/18/2017  . Altered mental status 02/12/2017  . Cannabis abuse 02/12/2017  . Nausea 02/12/2017  . Failure to thrive in adult 02/12/2017  . Hypokalemia 02/12/2017  . Fatigue 02/12/2017  . Metastatic lung cancer (metastasis from lung to other site) ([place]) 02/12/2017  . Hypothyroidism 02/11/2017  . Malignancy ([place])   . Palliative care by specialist   . Goals of care, counseling/discussion   . DNR (do not resuscitate) discussion   . Depressive disorder   . Dehydration 02/07/2017  . Weight loss 02/07/2017  . Encounter for antineoplastic chemotherapy 02/01/2017   . 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 Sepsis ([place]) 11/07/2016  . Protein-calorie malnutrition, severe 11/07/2016  . Acute respiratory distress 06/21/2016  . COPD exacerbation ([place]) 06/21/2016  . Sinus tachycardia 06/21/2016  . Recurrent aspiration bronchitis/pneumonia ([place]) 06/21/2016  . Dysphagia 06/21/2016  . Nausea and vomiting 06/21/2016  . Dental abscess 05/10/2016  . Swallowing difficulty 03/28/2016  . Hypomagnesemia 02/29/2016  . Hoarseness 02/18/2016  . Cancer related pain 07/08/2015  . Adenocarcinoma, lung ([place]) 03/26/2015  . 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 Ear ache 10/03/2014  . Abscess of buccal cavity 10/03/2014  . Lump in neck 10/03/2014  . Laryngeal pain 10/03/2014    Past Surgical History:  Procedure Laterality Date  . CESAREAN SECTION CLASSICAL    . STOMACH SURGERY     Pt reports for a tumor    Prior to Admission medications   Medication Sig Start Date End Date Taking? Authorizing Provider  ALPRAZolam (XANAX) 0.25 MG tablet Take 1 tablet (0.25 mg total) by mouth 3 (three) times daily as needed for anxiety. 05/06/17   Bettey Costa, MD  aspirin EC 81 MG EC tablet Take 1 tablet (81 mg total) by mouth daily. 05/01/17   Demetrios Loll, MD  docusate sodium (COLACE) 100 MG capsule Take 100 mg by mouth 2 (two) times daily as needed for mild constipation.    [provider]  feeding supplement (BOOST / RESOURCE BREEZE) LIQD Take 1 Container by mouth 2 (two) times daily between meals. 02/12/17   [name], MD  folic acid (FOLVITE) 470 MCG tablet  Take 800 mcg by mouth daily.     [provider]  ipratropium-albuterol (DUONEB) 0.5-2.5 (3) MG/3ML SOLN Take 3 mLs by nebulization every 4 (four) hours. 02/12/17   [name], MD  levothyroxine (SYNTHROID, LEVOTHROID) 100 MCG tablet Take 100 mcg by mouth daily before breakfast.  02/24/17   [provider]  lidocaine (XYLOCAINE) 2 % solution Use as directed 10 mLs  in the mouth or throat every 4 (four) hours as needed for mouth pain.    [provider]  magnesium oxide (MAG-OX) 400 MG tablet Take 1 tablet (400 mg total) by mouth 2 (two) times daily. 03/04/17   [name], MD  megestrol (MEGACE) 40 MG/ML suspension Take 5 mLs (200 mg total) by mouth 2 (two) times daily. 04/24/17   Lequita Asal, MD  methimazole (TAPAZOLE) 5 MG tablet Take 1 tablet (5 mg total) by mouth daily. 03/19/17   [name], MD  metoprolol (LOPRESSOR) 50 MG tablet Take 0.5 tablets (25 mg total) by mouth 2 (two) times daily. 03/04/17   [name], MD  Morphine Sulfate (MORPHINE CONCENTRATE) 10 mg / 0.5 ml concentrated solution Take 0.5 mLs (10 mg total) by mouth every 4 (four) hours as needed for severe pain. 05/06/17   Bettey Costa, MD  omeprazole (PRILOSEC) 20 MG capsule Take 1 capsule (20 mg total) by mouth daily. 06/20/16   Lequita Asal, MD  ondansetron (ZOFRAN) 4 MG tablet Take 1 tablet (4 mg total) by mouth every 6 (six) hours as needed for nausea. 02/12/17   [name], MD  potassium chloride 20 MEQ TBCR Take 1 tab twice a day 03/04/17   [name], MD  thiamine (VITAMIN B-1) 100 MG tablet Take 1 tablet (100 mg total) by mouth daily. 02/21/17   [name], MD  tiotropium (SPIRIVA) 18 MCG inhalation capsule Place 1 capsule (18 mcg total) into inhaler and inhale daily. 06/22/16   [name], MD  traZODone (DESYREL) 50 MG tablet Take 1 tablet (50 mg total) by mouth at bedtime as needed for sleep. Patient not taking: Reported on 04/29/2017 04/24/17   Lequita Asal, MD    Allergies No known allergies  Family History  Problem Relation Age of Onset  . Hypertension Father   . Cancer Father   . Diabetes Father   . Cancer Maternal Aunt   . Cancer Mother     Social History Social History  Substance Use Topics  . Smoking status: Current Every Day Smoker    Packs/day: 0.25    Years: 15.00    Types: Cigarettes  . Smokeless tobacco:  Never Used  . Alcohol use No    Review of Systems Constitutional: No fever/chills Eyes: No visual changes. ENT: No sore throat. Cardiovascular: Denies chest pain. Respiratory: Positive for shortness of breath. Gastrointestinal: No abdominal pain.  No nausea, no vomiting.  No diarrhea.  No constipation. Genitourinary: Negative for dysuria. Musculoskeletal: Negative for neck pain.  Negative for back pain. Integumentary: Negative for rash. Neurological: Negative for headaches, focal weakness or numbness.   ____________________________________________   PHYSICAL EXAM:  VITAL SIGNS: ED Triage Vitals  Enc Vitals Group     BP 04/22/17 0055 (!) 149/106     Pulse Rate 04/22/17 0055 (!) 133     Resp 04/22/17 0055 (!) 24     Temp 04/22/17 0055 98.8 F (37.1 C)     Temp Source 04/22/17 0055 Oral     SpO2 04/22/17 0055 97 %  Weight 04/22/17 0052 105 lb (47.6 kg)     Height 04/22/17 0052 '5\' 1"'$  (1.549 m)     Head Circumference --      Peak Flow --      Pain Score 04/22/17 0051 6     Pain Loc --      Pain Edu? --      Excl. in Organ? --     Constitutional: Alert and oriented. Tearful, cachectic Eyes: Conjunctivae are normal.  Head: Atraumatic. Mouth/Throat: Mucous membranes are moist.  Oropharynx non-erythematous. Neck: No stridor.   Cardiovascular: Tachycardia, regular rhythm. Good peripheral circulation. Grossly normal heart sounds. Respiratory: Normal respiratory effort.  No retractions. Extremely wheezes noted bilaterally Gastrointestinal: Soft and nontender. No distention.  Musculoskeletal: No lower extremity tenderness nor edema. No gross deformities of extremities. Neurologic:  Normal speech and language. No gross focal neurologic deficits are appreciated.  Skin:  Skin is warm, dry and intact. No rash noted. Psychiatric: Mood and affect are normal. Speech and behavior are normal.   EKG  ED ECG REPORT I, [place] N Malynn Lucy, the attending physician, personally viewed and  interpreted this ECG.   Date: 05/09/2017  EKG Time: 12:51 AM  Rate: 132  Rhythm: Sinus tachycardia  Axis: Normal  Intervals: Normal  ST&T Change: None  ____________________________________________  RADIOLOGY I, Jew[place] N Jabreel Chimento, personally viewed and evaluated these images (plain radiographs) as part of my medical decision making, as well as reviewing the written report by the radiologist.  CLINICAL DATA:  Initial evaluation for acute dyspnea, cough.  EXAM: CHEST  2 VIEW  COMPARISON:  Prior radiograph from 04/20/2017.  FINDINGS: Cardiac and mediastinal silhouettes are stable in size and contour, and remain within normal limits.  Chronic elevation left hemidiaphragm, stable. Cavitary left lung lesion unchanged. Minimal left perihilar atelectasis. No new focal infiltrates. No pulmonary edema or pleural effusion. No pneumothorax.  No acute osseous abnormality.  IMPRESSION: 1. Stable exam. Chronic elevation of the left hemidiaphragm with minimal left perihilar atelectasis. Cavitary lung lesion is stable. 2. No other active cardiopulmonary disease.   Electronically Signed   By: Jeannine Boga M.D.   On: 04/22/2017 03:01   Procedures   ____________________________________________   INITIAL IMPRESSION / ASSESSMENT AND PLAN / ED COURSE  Pertinent labs & imaging results that were available during my care of the patient were reviewed by me and considered in my medical decision making (see chart for details). Patient received a DuoNeb on arrival to the emergency department with complete resolution of symptoms. Patient requesting something to eat at this time and refuses any further evaluation.      ____________________________________________  FINAL CLINICAL IMPRESSION(S) / ED DIAGNOSES  Final diagnoses:  Acute respiratory distress  Malignant neoplasm of upper lobe of left lung (HCC)     MEDICATIONS GIVEN DURING THIS VISIT:  Medications    albuterol (PROVENTIL) (2.5 MG/3ML) 0.083% nebulizer solution 2.5 mg (2.5 mg Nebulization Given 04/22/17 0117)  sodium chloride 0.9 % bolus 1,000 mL (0 mLs Intravenous Stopped 04/22/17 0359)     NEW OUTPATIENT MEDICATIONS STARTED DURING THIS VISIT:  Discharge Medication List as of 04/22/2017  6:20 AM      Discharge Medication List as of 04/22/2017  6:20 AM      Discharge Medication List as of 04/22/2017  6:20 AM       Note:  This document was prepared using Dragon voice recognition software and may include unintentional dictation errors.    Gregor Hams, MD 05/09/17 2239

## 2017-06-05 ENCOUNTER — Encounter: Payer: Self-pay | Admitting: Hematology and Oncology

## 2017-06-29 ENCOUNTER — Telehealth: Payer: Self-pay | Admitting: *Deleted

## 2017-06-29 NOTE — Telephone Encounter (Signed)
Per Dr Mike Gip OK to sign orders. PC and verbal orders to hospice

## 2017-06-29 NOTE — Telephone Encounter (Signed)
Called top report that patient is moving back from [place] and transferring care to [facility], Will you sign order? Needs your last office note also

## 2017-07-06 ENCOUNTER — Telehealth: Payer: Self-pay | Admitting: *Deleted

## 2017-07-06 NOTE — Telephone Encounter (Signed)
Patient had been on MS ER in facility and was not discharged with it, she is on MS IR Requesting to order MS ER again or something long acting, and stay on IR. Please advise

## 2017-07-06 NOTE — Telephone Encounter (Signed)
Audrey Mcgee advised per VO Dr Faye Ramsay rcoran that she does not want to order MS Contin for this patietn as she did not take it in the past stating that she only like the fast acting MS. Also of note is that she was not discharged form hospital on this and has no way to know what dose she was getting. Audrey Mcgee will ask this patient to keep a pain med diary

## 2017-07-06 NOTE — Telephone Encounter (Signed)
   I believe patient is on Hospice.  M

## 2017-07-06 NOTE — Telephone Encounter (Signed)
Yes, Hospice is requesting an order because she does not have any long acting medication

## 2017-07-15 ENCOUNTER — Telehealth: Payer: Self-pay | Admitting: *Deleted

## 2017-07-15 NOTE — Telephone Encounter (Signed)
  Has she had Remeron before?  Has she tried other things for sleep?  M

## 2017-07-15 NOTE — Telephone Encounter (Signed)
  Why does she want to be on Remeron?  This can work as an appetite stimulant for some people (side effect of this medication), but it has a lot of side effects.  Is she taking Megace?  What dose?  It worked in the past.  M

## 2017-07-15 NOTE — Telephone Encounter (Signed)
Requesting order and prescription for Remeron 15 mg at hs. Patient willing to stop Megace. Please advise

## 2017-07-15 NOTE — Telephone Encounter (Signed)
Remeron is to help her sleep

## 2017-07-16 NOTE — Telephone Encounter (Signed)
So I misunderstood Audrey Mcgee yesterday and am told she wants it for appetite, [name] states she eats very well has snacks at her bedside and she wants medicine for appetite because she "has cancer". Hospice does not cover Megace so the alternative is Remeron

## 2017-07-16 NOTE — Telephone Encounter (Signed)
Dr. Mike Gip said if she is eating that good she does not need it

## 2017-07-16 NOTE — Telephone Encounter (Signed)
Audrey Mcgee informed of no need for Remeron

## 2017-08-07 ENCOUNTER — Other Ambulatory Visit: Payer: Self-pay | Admitting: Hematology and Oncology

## 2017-08-13 ENCOUNTER — Other Ambulatory Visit: Payer: Self-pay | Admitting: *Deleted

## 2017-08-13 NOTE — Telephone Encounter (Signed)
Return call from hospice stating the patient had been on MS IR at the care facility and was discharged home with prescription 7 weeks ago.

## 2017-08-13 NOTE — Telephone Encounter (Signed)
Per VO Dr Mike Gip, patient must choose MS IR or Roxanol. Sidney informed and will notify [name] her regular nurse

## 2017-08-13 NOTE — Telephone Encounter (Addendum)
We never ordered MS IR for this patient, Casimer Bilis will contact the regular nurse about this and call me back

## 2017-09-28 ENCOUNTER — Other Ambulatory Visit: Payer: Self-pay | Admitting: Hematology and Oncology

## 2017-10-22 ENCOUNTER — Telehealth: Payer: Self-pay | Admitting: *Deleted

## 2017-10-22 MED ORDER — TRAMADOL HCL 50 MG PO TABS: 50.0000 mg | ORAL_TABLET | Freq: Three times a day (TID) | ORAL | 0 refills | Status: AC | PRN

## 2017-10-22 NOTE — Telephone Encounter (Signed)
Prescription faxed per VO Dr Rosetta Posner informed

## 2017-10-22 NOTE — Telephone Encounter (Signed)
Sarah the hospice nurse is asking for Tramadol prescription for patient complaining of pain/ soreness from falling into walls. She has no pain medicine in the home at all. She is living with her daughter again as her brother now has cancer and is as bad a shape as she is in. Please advise

## 2017-11-05 ENCOUNTER — Other Ambulatory Visit: Payer: Self-pay | Admitting: Hematology and Oncology

## 2017-11-05 NOTE — Telephone Encounter (Signed)
  Do we fill this?  M

## 2017-11-05 NOTE — Telephone Encounter (Signed)
I called Hospice and spoke with Judeen Hammans in triage after checking computer to see that Dr Posey Pronto started her on this in March, not documented who has been refilling since that time. She will have her nurse check into it and call her PCP for refill

## 2017-11-18 ENCOUNTER — Telehealth: Payer: Self-pay | Admitting: *Deleted

## 2017-11-18 MED ORDER — MORPHINE SULFATE (CONCENTRATE) 20 MG/ML PO SOLN: 10.0000 mg | ORAL | 0 refills | Status: AC | PRN

## 2017-11-18 NOTE — Telephone Encounter (Signed)
  She is ok for Roxanol.  She could have brain mets.  That would require imaging.  M

## 2017-11-18 NOTE — Telephone Encounter (Signed)
Please print and fax to [facility] [phone]

## 2017-11-18 NOTE — Telephone Encounter (Signed)
Patient needs Roxanol as she has nothing for pain and she has been complaining of horrible headaches for 3 weeks and are getting worse to the point of her wanting to call EMS today. Asking if she might have brain mets and if dexamethasone would help her. [name] states she will put a lock box in the home for narcotics.

## 2017-12-23 ENCOUNTER — Other Ambulatory Visit: Payer: Self-pay | Admitting: *Deleted

## 2017-12-23 MED ORDER — LEVOTHYROXINE SODIUM 100 MCG PO TABS: 100.0000 ug | ORAL_TABLET | Freq: Every day | ORAL | 0 refills | Status: AC

## 2017-12-23 NOTE — Telephone Encounter (Signed)
We have not filled this that I can tell. Please advise

## 2017-12-26 ENCOUNTER — Other Ambulatory Visit: Payer: Self-pay | Admitting: Hematology and Oncology

## 2018-01-04 ENCOUNTER — Other Ambulatory Visit: Payer: Self-pay | Admitting: *Deleted

## 2018-01-04 NOTE — Telephone Encounter (Signed)
   Ref Range & Units 29mo ago  TSH 0.350 - 4.500 uIU/mL <0.010 Abnormally low

## 2018-01-04 NOTE — Telephone Encounter (Signed)
I just filled her Synthroid on 12/23/2017? Was there a problem with the prescription?

## 2018-01-04 NOTE — Telephone Encounter (Signed)
[facility] asking for 90 day supply, which cannot be due to her being hospice

## 2018-01-04 NOTE — Telephone Encounter (Signed)
  Do we know her free T4?  M

## 2018-02-05 ENCOUNTER — Emergency Department

## 2018-02-05 ENCOUNTER — Inpatient Hospital Stay
Admission: EM | Admit: 2018-02-05 | Discharge: 2018-02-19 | DRG: 208 | Disposition: E | Attending: Internal Medicine | Admitting: Internal Medicine

## 2018-02-05 DIAGNOSIS — Z79890 Hormone replacement therapy (postmenopausal): Secondary | ICD-10-CM | POA: Diagnosis not present

## 2018-02-05 DIAGNOSIS — I498 Other specified cardiac arrhythmias: Secondary | ICD-10-CM | POA: Diagnosis present

## 2018-02-05 DIAGNOSIS — Z8249 Family history of ischemic heart disease and other diseases of the circulatory system: Secondary | ICD-10-CM

## 2018-02-05 DIAGNOSIS — J449 Chronic obstructive pulmonary disease, unspecified: Secondary | ICD-10-CM | POA: Diagnosis present

## 2018-02-05 DIAGNOSIS — C7931 Secondary malignant neoplasm of brain: Secondary | ICD-10-CM | POA: Diagnosis present

## 2018-02-05 DIAGNOSIS — I468 Cardiac arrest due to other underlying condition: Secondary | ICD-10-CM | POA: Diagnosis present

## 2018-02-05 DIAGNOSIS — Z7982 Long term (current) use of aspirin: Secondary | ICD-10-CM

## 2018-02-05 DIAGNOSIS — F17210 Nicotine dependence, cigarettes, uncomplicated: Secondary | ICD-10-CM | POA: Diagnosis present

## 2018-02-05 DIAGNOSIS — I469 Cardiac arrest, cause unspecified: Secondary | ICD-10-CM | POA: Diagnosis present

## 2018-02-05 DIAGNOSIS — I10 Essential (primary) hypertension: Secondary | ICD-10-CM | POA: Diagnosis present

## 2018-02-05 DIAGNOSIS — E079 Disorder of thyroid, unspecified: Secondary | ICD-10-CM | POA: Diagnosis present

## 2018-02-05 DIAGNOSIS — C3412 Malignant neoplasm of upper lobe, left bronchus or lung: Secondary | ICD-10-CM | POA: Diagnosis present

## 2018-02-05 DIAGNOSIS — R579 Shock, unspecified: Secondary | ICD-10-CM | POA: Diagnosis not present

## 2018-02-05 DIAGNOSIS — I213 ST elevation (STEMI) myocardial infarction of unspecified site: Secondary | ICD-10-CM

## 2018-02-05 DIAGNOSIS — Z9289 Personal history of other medical treatment: Secondary | ICD-10-CM

## 2018-02-05 DIAGNOSIS — R402212 Coma scale, best verbal response, none, at arrival to emergency department: Secondary | ICD-10-CM | POA: Diagnosis present

## 2018-02-05 DIAGNOSIS — N179 Acute kidney failure, unspecified: Secondary | ICD-10-CM | POA: Diagnosis present

## 2018-02-05 DIAGNOSIS — E222 Syndrome of inappropriate secretion of antidiuretic hormone: Secondary | ICD-10-CM | POA: Diagnosis present

## 2018-02-05 DIAGNOSIS — E861 Hypovolemia: Secondary | ICD-10-CM | POA: Diagnosis present

## 2018-02-05 DIAGNOSIS — R402112 Coma scale, eyes open, never, at arrival to emergency department: Secondary | ICD-10-CM | POA: Diagnosis present

## 2018-02-05 DIAGNOSIS — I248 Other forms of acute ischemic heart disease: Secondary | ICD-10-CM | POA: Diagnosis present

## 2018-02-05 DIAGNOSIS — F0390 Unspecified dementia without behavioral disturbance: Secondary | ICD-10-CM | POA: Diagnosis present

## 2018-02-05 DIAGNOSIS — R402312 Coma scale, best motor response, none, at arrival to emergency department: Secondary | ICD-10-CM | POA: Diagnosis present

## 2018-02-05 LAB — BLOOD GAS, ARTERIAL
ACID-BASE DEFICIT: 16.9 mmol/L — AB (ref 0.0–2.0)
ACID-BASE DEFICIT: 13.7 mmol/L — AB (ref 0.0–2.0)
BICARBONATE: 16.9 mmol/L — AB (ref 20.0–28.0)
Bicarbonate: 12.7 mmol/L — ABNORMAL LOW (ref 20.0–28.0)
FIO2: 1
FIO2: 1
MECHVT: 300 mL
Mechanical Rate: 20
O2 SAT: 37.4 %
PATIENT TEMPERATURE: 37
PCO2 ART: 47 mmHg (ref 32.0–48.0)
PEEP/CPAP: 8 cmH2O
PH ART: 7.03 — AB (ref 7.350–7.450)
PO2 ART: 34 mmHg — AB (ref 83.0–108.0)
Patient temperature: 37
pCO2 arterial: 64 mmHg — ABNORMAL HIGH (ref 32.0–48.0)
pH, Arterial: 7.04 — CL (ref 7.350–7.450)
pO2, Arterial: <31 mmHg — CL (ref 83.0–108.0)

## 2018-02-05 LAB — COMPREHENSIVE METABOLIC PANEL
ALT: 37 U/L (ref 14–54)
ANION GAP: 24 — AB (ref 5–15)
AST: 151 U/L — ABNORMAL HIGH (ref 15–41)
Albumin: 2 g/dL — ABNORMAL LOW (ref 3.5–5.0)
Alkaline Phosphatase: 270 U/L — ABNORMAL HIGH (ref 38–126)
BILIRUBIN TOTAL: 7.7 mg/dL — AB (ref 0.3–1.2)
BUN: 40 mg/dL — ABNORMAL HIGH (ref 6–20)
CHLORIDE: 85 mmol/L — AB (ref 101–111)
CO2: 15 mmol/L — ABNORMAL LOW (ref 22–32)
Calcium: 6.9 mg/dL — ABNORMAL LOW (ref 8.9–10.3)
Creatinine, Ser: 6.88 mg/dL — ABNORMAL HIGH (ref 0.44–1.00)
GFR calc Af Amer: 7 mL/min — ABNORMAL LOW (ref >60)
GFR, EST NON AFRICAN AMERICAN: 6 mL/min — AB (ref >60)
Glucose, Bld: 79 mg/dL (ref 65–99)
POTASSIUM: 4.6 mmol/L (ref 3.5–5.1)
Sodium: 124 mmol/L — ABNORMAL LOW (ref 135–145)
TOTAL PROTEIN: 5.5 g/dL — AB (ref 6.5–8.1)

## 2018-02-05 LAB — CBC WITH DIFFERENTIAL/PLATELET
BASOS PCT: 0 %
Basophils Absolute: 0 10*3/uL (ref 0–0.1)
EOS PCT: 1 %
Eosinophils Absolute: 0.2 10*3/uL (ref 0–0.7)
HCT: 27.2 % — ABNORMAL LOW (ref 35.0–47.0)
Hemoglobin: 9 g/dL — ABNORMAL LOW (ref 12.0–16.0)
Lymphocytes Relative: 26 %
Lymphs Abs: 4.2 10*3/uL — ABNORMAL HIGH (ref 1.0–3.6)
MCH: 28 pg (ref 26.0–34.0)
MCHC: 33.1 g/dL (ref 32.0–36.0)
MCV: 84.7 fL (ref 80.0–100.0)
MONO ABS: 0.5 10*3/uL (ref 0.2–0.9)
MONOS PCT: 3 %
Neutro Abs: 11.1 10*3/uL — ABNORMAL HIGH (ref 1.4–6.5)
Neutrophils Relative %: 70 %
PLATELETS: 230 10*3/uL (ref 150–440)
RBC: 3.22 MIL/uL — ABNORMAL LOW (ref 3.80–5.20)
RDW: 18.6 % — AB (ref 11.5–14.5)
WBC: 16.1 10*3/uL — ABNORMAL HIGH (ref 3.6–11.0)

## 2018-02-05 LAB — TROPONIN I: TROPONIN I: 0.03 ng/mL — AB (ref <0.03)

## 2018-02-05 MED ORDER — IPRATROPIUM-ALBUTEROL 0.5-2.5 (3) MG/3ML IN SOLN: 3.0000 mL | RESPIRATORY_TRACT | Status: DC

## 2018-02-05 MED ORDER — HEPARIN SODIUM (PORCINE) 5000 UNIT/ML IJ SOLN: 5000.0000 [IU] | Freq: Three times a day (TID) | INTRAMUSCULAR | Status: DC

## 2018-02-05 MED ORDER — DEXTROSE 5 % IV SOLN
0.5000 ug/min | INTRAVENOUS | Status: DC
  Filled 2018-02-05: qty 4

## 2018-02-05 MED ORDER — LORAZEPAM 2 MG/ML IJ SOLN: 1.0000 mg | INTRAMUSCULAR | Status: DC | PRN

## 2018-02-05 MED ORDER — TRAZODONE HCL 50 MG PO TABS: 25.0000 mg | ORAL_TABLET | Freq: Every evening | ORAL | Status: DC | PRN

## 2018-02-05 MED ORDER — HYDROCODONE-ACETAMINOPHEN 5-325 MG PO TABS: 1.0000 | ORAL_TABLET | ORAL | Status: DC | PRN

## 2018-02-05 MED ORDER — EPINEPHRINE PF 1 MG/ML IJ SOLN
0.5000 ug/min | INTRAVENOUS | Status: DC
  Administered 2018-02-05: 4.5 ug/min via INTRAVENOUS
  Filled 2018-02-05: qty 4

## 2018-02-05 MED ORDER — SODIUM CHLORIDE 0.9 % IV BOLUS (SEPSIS): 500.0000 mL | Freq: Once | INTRAVENOUS | Status: DC

## 2018-02-05 MED ORDER — EPINEPHRINE PF 1 MG/10ML IJ SOSY
PREFILLED_SYRINGE | INTRAMUSCULAR | Status: AC | PRN
  Administered 2018-02-05: 1 mg via INTRAVENOUS

## 2018-02-05 MED ORDER — ACETAMINOPHEN 650 MG RE SUPP: 650.0000 mg | Freq: Four times a day (QID) | RECTAL | Status: DC | PRN

## 2018-02-05 MED ORDER — ASPIRIN 300 MG RE SUPP: 300.0000 mg | Freq: Every day | RECTAL | Status: DC

## 2018-02-05 MED ORDER — NOREPINEPHRINE BITARTRATE 1 MG/ML IV SOLN
0.0000 ug/min | Freq: Once | INTRAVENOUS | Status: DC
  Filled 2018-02-05: qty 4

## 2018-02-05 MED ORDER — BISACODYL 5 MG PO TBEC
5.0000 mg | DELAYED_RELEASE_TABLET | Freq: Every day | ORAL | Status: DC | PRN
  Filled 2018-02-05: qty 1

## 2018-02-05 MED ORDER — NOREPINEPHRINE BITARTRATE 1 MG/ML IV SOLN
0.0000 ug/min | Freq: Once | INTRAVENOUS | Status: AC
  Administered 2018-02-05: 10 ug/min via INTRAVENOUS
  Filled 2018-02-05 (×2): qty 4

## 2018-02-05 MED ORDER — SODIUM CHLORIDE 0.9 % IV SOLN: INTRAVENOUS | Status: DC

## 2018-02-05 MED ORDER — SODIUM BICARBONATE 8.4 % IV SOLN
INTRAVENOUS | Status: AC | PRN
  Administered 2018-02-05: 50 meq via INTRAVENOUS

## 2018-02-05 MED ORDER — DOCUSATE SODIUM 100 MG PO CAPS: 100.0000 mg | ORAL_CAPSULE | Freq: Two times a day (BID) | ORAL | Status: DC

## 2018-02-05 MED ORDER — EPINEPHRINE PF 1 MG/10ML IJ SOSY
PREFILLED_SYRINGE | INTRAMUSCULAR | Status: AC | PRN
  Administered 2018-02-05: .05 mg via INTRAVENOUS
  Administered 2018-02-05 (×2): 0.5 mg via INTRAVENOUS

## 2018-02-05 MED ORDER — ONDANSETRON HCL 4 MG PO TABS: 4.0000 mg | ORAL_TABLET | Freq: Four times a day (QID) | ORAL | Status: DC | PRN

## 2018-02-05 MED ORDER — ASPIRIN 300 MG RE SUPP
RECTAL | Status: AC
  Administered 2018-02-05: 300 mg
  Filled 2018-02-05: qty 1

## 2018-02-05 MED ORDER — IPRATROPIUM-ALBUTEROL 0.5-2.5 (3) MG/3ML IN SOLN
RESPIRATORY_TRACT | Status: AC
  Administered 2018-02-05: 21:00:00
  Filled 2018-02-05: qty 3

## 2018-02-05 MED ORDER — ONDANSETRON HCL 4 MG/2ML IJ SOLN: 4.0000 mg | Freq: Four times a day (QID) | INTRAMUSCULAR | Status: DC | PRN

## 2018-02-05 MED ORDER — EPINEPHRINE PF 1 MG/10ML IJ SOSY
PREFILLED_SYRINGE | INTRAMUSCULAR | Status: AC | PRN
Start: 2018-02-05 — End: 2018-02-05
  Administered 2018-02-05: 1 mg via INTRAVENOUS

## 2018-02-05 MED ORDER — MORPHINE SULFATE (PF) 4 MG/ML IV SOLN: 4.0000 mg | INTRAVENOUS | Status: DC | PRN

## 2018-02-05 MED ORDER — ACETAMINOPHEN 325 MG PO TABS: 650.0000 mg | ORAL_TABLET | Freq: Four times a day (QID) | ORAL | Status: DC | PRN

## 2018-02-06 MED FILL — Medication: Qty: 1 | Status: AC

## 2018-02-06 NOTE — Code Documentation (Signed)
Patient arrived from the ED in cardiac arrest. Briefly, this is a 57 y/o AA female with a PMH of metastatic lung cancer; who was previously in hospice care; presented to the ED via EMS as an out of hospital arrest. Exact downtime out of hospital arrest unknown. EMS was called for unresponsiveness. Patient was found pulseless and CPR was performed with multiple short instances of ROSC. Upon arrival in the ED, patient coded again and was resuscitated. She was intubated but remained in profound shock despite being on IV fluids, epinephrine and norepinephrine infusions.  Upon arrival in the ICU, patient was found to be pulseless while still on the gurney and CPR was initiated. Patient received 4 doses of epinephrine and 2 amps of bicarb. In consultation with Dr. Gladys Damme from [facility], resuscitation efforts were deemed futile per ACLS protocol and terminated at 2256-03-23. Patient pronounced; Support provided to the family. Dr Celesta Aver notified of patient's demise.    Magdalene S. [facility] ANP-BC Pulmonary and Critical Care Medicine [facility] Pager [phone] or [phone]  NB: This document was prepared using Dragon voice recognition software and may include unintentional dictation errors.

## 2018-02-08 ENCOUNTER — Telehealth: Payer: Self-pay | Admitting: *Deleted

## 2018-02-08 NOTE — Telephone Encounter (Signed)
Clarise Cruz with Hospice called to report that went to Hospital over weekend and arrested multiple times but finally succumbed and passed away

## 2018-02-09 ENCOUNTER — Telehealth: Payer: Self-pay

## 2018-02-09 NOTE — Telephone Encounter (Signed)
Death cert placed in DK's folder to be signed.

## 2018-02-09 NOTE — Telephone Encounter (Signed)
Received Death Certificate from [place] in Nurse box

## 2018-02-11 NOTE — Telephone Encounter (Signed)
[employer] notified death certificate ready for pick-up.

## 2018-02-15 ENCOUNTER — Telehealth: Payer: Self-pay | Admitting: Pulmonary Disease

## 2018-02-15 NOTE — Telephone Encounter (Signed)
Placed in DK's folder to be signed.

## 2018-02-15 NOTE — Telephone Encounter (Signed)
Recieved Death Certificate from [facility] [phone]  Delivered/Placed in pulmonary box.

## 2018-02-16 NOTE — Telephone Encounter (Signed)
[employer] notified death certificate ready for p/u.

## 2018-02-19 NOTE — ED Provider Notes (Signed)
[facility] Emergency Department Provider Note   ____________________________________________   First MD Initiated Contact with Patient 2018/02/27 1952     (approximate)  I have reviewed the triage vital signs and the nursing notes.   HISTORY  Chief Complaint No chief complaint on file.  Chief complaint is cardiopulmonary arrest survivor  HPI Audrey Mcgee is a 57 y.o. female who collapsed at home. CPR was started by a fire department. EMS arrived and got her pulse and lost it several times. She got at least 2 epis with EMS. Here she lost her pulse again got an appendectomy and CPR and pulse returned. After that came airway was pulled out as there were bubbles coming out of the tube. Patient was intubated under direct vision with a 7/2 ET tube. tube was visualized going through the cords. Copious debris had to be suctioned out before I could do that. Chest x-ray showed the ET tube in good position cancer in the left upper lobe is easily seen. EKG was done and showed wide complex rhythm. Second EKG that showed some ST elevation anterior laterally. Cardiology was called. Cardiologist felt the patient was not a good candidate for cardiac catheter as she was hypotensive at the time in spite of being on Aleve ephedra. And she had metastatic lung cancer. Cardiologist himself reviewed the chart as I could not access it is the computer was malfunctioning. she has family was present they told me that she had been a DO NOT RESUSCITATE and changed her mind. She is now full code. patient did get rectal aspirin. Past Medical History:  Diagnosis Date  . COPD (chronic obstructive pulmonary disease) ([place])   . Dementia   . Hypertension   . Lung cancer ([place])   . Thyroid disease     Patient Active Problem List   Diagnosis Date Noted  . Cardiac arrest ([place]) 02/27/18  . Chest pain 04/30/2017  . Dementia with behavioral disturbance 04/30/2017  . Elevated troponin  04/29/2017  . Failure to thrive (0-17) 04/20/2017  . Adjustment disorder with mixed anxiety and depressed mood 04/16/2017  . SOB (shortness of breath)   . Protein-calorie malnutrition ([place])   . HCAP (healthcare-associated pneumonia) 03/12/2017  . Hyperthyroidism 03/02/2017  . COPD (chronic obstructive pulmonary disease) ([place]) 03/02/2017  . Subacute delirium 02/18/2017  . Altered mental status 02/12/2017  . Cannabis abuse 02/12/2017  . Nausea 02/12/2017  . Failure to thrive in adult 02/12/2017  . Hypokalemia 02/12/2017  . Fatigue 02/12/2017  . Metastatic lung cancer (metastasis from lung to other site) ([place]) 02/12/2017  . Hypothyroidism 02/11/2017  . Malignancy ([place])   . Palliative care by specialist   . Goals of care, counseling/discussion   . DNR (do not resuscitate) discussion   . Depressive disorder   . Dehydration 02/07/2017  . Weight loss 02/07/2017  . Encounter for antineoplastic chemotherapy 02/01/2017  . 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 Sepsis ([place]) 11/07/2016  . Protein-calorie malnutrition, severe 11/07/2016  . Acute respiratory distress 06/21/2016  . COPD exacerbation ([place]) 06/21/2016  . Sinus tachycardia 06/21/2016  . Recurrent aspiration bronchitis/pneumonia ([place]) 06/21/2016  . Dysphagia 06/21/2016  . Nausea and vomiting 06/21/2016  . Dental abscess 05/10/2016  . Swallowing difficulty 03/28/2016  . Hypomagnesemia 02/29/2016  . Hoarseness 02/18/2016  . Cancer related pain 07/08/2015  . Adenocarcinoma, lung ([place]) 03/26/2015  . 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 Ear ache 10/03/2014  . Abscess of buccal cavity 10/03/2014  .  Lump in neck 10/03/2014  . Laryngeal pain 10/03/2014    Past Surgical History:  Procedure Laterality Date  . CESAREAN SECTION CLASSICAL    . STOMACH SURGERY     Pt reports for a tumor    Prior to Admission medications   Medication Sig Start Date End Date Taking? Authorizing Provider    ALPRAZolam (XANAX) 0.25 MG tablet Take 1 tablet (0.25 mg total) by mouth 3 (three) times daily as needed for anxiety. 05/06/17   Bettey Costa, MD  [facility] ASPIRIN LOW STRENGTH 81 MG EC tablet TAKE 1 TABLET BY MOUTH EVERY DAY 12/28/17   Lequita Asal, MD  docusate sodium (COLACE) 100 MG capsule Take 100 mg by mouth 2 (two) times daily as needed for mild constipation.    [provider]  feeding supplement (BOOST / RESOURCE BREEZE) LIQD Take 1 Container by mouth 2 (two) times daily between meals. 02/12/17   [name], MD  folic acid (FOLVITE) 938 MCG tablet Take 800 mcg by mouth daily.     [provider]  ipratropium-albuterol (DUONEB) 0.5-2.5 (3) MG/3ML SOLN Take 3 mLs by nebulization every 4 (four) hours. 02/12/17   [name], MD  levothyroxine (SYNTHROID, LEVOTHROID) 100 MCG tablet Take 1 tablet (100 mcg total) by mouth daily before breakfast. 12/23/17   Karen Kitchens, NP  lidocaine (XYLOCAINE) 2 % solution Use as directed 10 mLs in the mouth or throat every 4 (four) hours as needed for mouth pain.    [provider]  magnesium oxide (MAG-OX) 400 MG tablet Take 1 tablet (400 mg total) by mouth 2 (two) times daily. 03/04/17   [name], MD  megestrol (MEGACE) 40 MG/ML suspension Take 5 mLs (200 mg total) by mouth 2 (two) times daily. 04/24/17   Lequita Asal, MD  methimazole (TAPAZOLE) 5 MG tablet Take 1 tablet (5 mg total) by mouth daily. 03/19/17   [name], MD  metoprolol (LOPRESSOR) 50 MG tablet Take 0.5 tablets (25 mg total) by mouth 2 (two) times daily. 03/04/17   [name], MD  morphine (ROXANOL) 20 MG/ML concentrated solution Take 0.5 mLs (10 mg total) by mouth every 4 (four) hours as needed for severe pain. 11/18/17   Lequita Asal, MD  omeprazole (PRILOSEC) 20 MG capsule TAKE 1 CAPSULE BY MOUTH EVERY DAY 08/07/17   Lequita Asal, MD  ondansetron (ZOFRAN) 4 MG tablet Take 1 tablet (4 mg total) by mouth every 6 (six) hours as  needed for nausea. 02/12/17   [name], MD  potassium chloride 20 MEQ TBCR Take 1 tab twice a day 03/04/17   [name], MD  [facility] HANDIHALER 18 MCG inhalation capsule INHALE 1 CAPSULE VIA HANDIHALER ONCE DAILY AT THE SAME TIME EVERY DAY 09/28/17   Lequita Asal, MD  thiamine (VITAMIN B-1) 100 MG tablet Take 1 tablet (100 mg total) by mouth daily. 02/21/17   [name], MD  traMADol (ULTRAM) 50 MG tablet Take 1 tablet (50 mg total) by mouth every 8 (eight) hours as needed. 10/22/17   Lequita Asal, MD  traZODone (DESYREL) 50 MG tablet Take 1 tablet (50 mg total) by mouth at bedtime as needed for sleep. Patient not taking: Reported on 04/29/2017 04/24/17   Lequita Asal, MD    Allergies No known allergies  Family History  Problem Relation Age of Onset  . Hypertension Father   . Cancer Father   . Diabetes Father   . Cancer Maternal Aunt   .  Cancer Mother     Social History Social History   Tobacco Use  . Smoking status: Current Every Day Smoker    Packs/day: 0.25    Years: 15.00    Pack years: 3.75    Types: Cigarettes  . Smokeless tobacco: Never Used  Substance Use Topics  . Alcohol use: No    Alcohol/week: 1.2 oz    Types: 2 Cans of beer per week  . Drug use: No    Review of Systems unable to obtain as patient is intubated and unconscious ____________________________________________   PHYSICAL EXAM:  VITAL SIGNS: ED Triage Vitals  Enc Vitals Group     BP 02-13-2018 2008 118/82     Pulse Rate 2018/02/13 2008 (!) 112     Resp 13-Feb-2018 2028 18     Temp --      Temp src --      SpO2 2018/02/13 2038 (!) 79 %     Weight 02/13/18 2104 100 lb (45.4 kg)     Height 2018/02/13 2104 5\' 4"  (1.626 m)     Head Circumference --      Peak Flow --      Pain Score --      Pain Loc --      Pain Edu? --      Excl. in [place]? --     Constitutional: patient's intubated and remained making spontaneous respiratory efforts Eyes: conjunctivae are jaundiced his pupils  are mid position and fixed and were that way when she arrived Head: Atraumatic. Nose: No congestion/rhinnorhea. Mouth/Throat: Mucous membranes are moist.  Oropharynx non-erythematous. Neck: No stridor. Cardiovascular: Normal rate, regular rhythm. Grossly normal heart sounds.  Respiratory: Normal respiratory effort.  No retractions. Lungs scattered crackles good breath sounds after intubation bilaterally not in the stomach Gastrointestinal: Soft and nontender.distention. No abdominal bruits. No CVA tenderness. Musculoskeletal: No lower extremity tenderness nor edema.  No joint effusions. Skin:  Skin is warm, dry and intact. No rash noted.  ____________________________________________   LABS (all labs ordered are listed, but only abnormal results are displayed)  Labs Reviewed  CBC WITH DIFFERENTIAL/PLATELET - Abnormal; Notable for the following components:      Result Value   WBC 16.1 (*)    RBC 3.22 (*)    Hemoglobin 9.0 (*)    HCT 27.2 (*)    RDW 18.6 (*)    Neutro Abs 11.1 (*)    Lymphs Abs 4.2 (*)    All other components within normal limits  COMPREHENSIVE METABOLIC PANEL - Abnormal; Notable for the following components:   Sodium 124 (*)    Chloride 85 (*)    CO2 15 (*)    BUN 40 (*)    Creatinine, Ser 6.88 (*)    Calcium 6.9 (*)    Total Protein 5.5 (*)    Albumin 2.0 (*)    AST 151 (*)    Alkaline Phosphatase 270 (*)    Total Bilirubin 7.7 (*)    GFR calc non Af Amer 6 (*)    GFR calc Af Amer 7 (*)    Anion gap 24 (*)    All other components within normal limits  TROPONIN I - Abnormal; Notable for the following components:   Troponin I 0.03 (*)    All other components within normal limits  BLOOD GAS, ARTERIAL - Abnormal; Notable for the following components:   pH, Arterial 7.04 (*)    pO2, Arterial 34 (*)    Bicarbonate 12.7 (*)  Acid-base deficit 16.9 (*)    All other components within normal limits  BLOOD GAS, ARTERIAL    ____________________________________________  EKG  EKGs as described in history of present illness ____________________________________________  RADIOLOGY  ED MD interpretation:  chest x-ray described in history of present illness cancer present in the left upper lobe ET tube in good position  Official radiology report(s): Dg Chest Portable 1 View  Result Date: 2018-02-18 CLINICAL DATA:  Code blue. Endotracheal and orogastric tube placement. Orogastric tube has reportedly been pulled back out by Dr. Cinda Quest after review of the chest radiograph. EXAM: PORTABLE CHEST 1 VIEW COMPARISON:  None. FINDINGS: Portable AP supine view of the chest was provided. Tip of a gastric tube projects over the right lower lobe. Endotracheal tube tip is seen at the level of the aortic arch. The carina is not well visualized on this exam but based on relative position with the aortic arch, the tip is believed be above the carina. Left perihilar parenchymal rounded opacities noted suspicious for pneumonia. This can mask underlying neoplastic etiologies. Gastric distention with air is identified of the included upper abdomen. Overlying cardiac monitoring devices are present. IMPRESSION: 1. Orogastric tube tip projects over the right lower lobe and reportedly has been pulled out. Otherwise, withdrawal of the tube is recommended. 2. Endotracheal tube tip projects over the superior mediastinum at the level of the aortic arch. 3. Left perihilar and upper lobe airspace opacities suspicious for pneumonia. 4. Gastric distention with air. Electronically Signed   By: Ashley Royalty M.D.   On: 18-Feb-2018 20:53    ____________________________________________   PROCEDURES  Procedure(s) performed: unable to pass an NG tube oral G-tube to relieve the distention of the stomach. I tried myself and was unsuccessful. one time the NG tube was passed into the lungs  Procedures  Critical Care performed:critical care time 2 hours spent  at the patient's bedtime continuously condition calling the intensivist in the hospital doctor on the phone  ____________________________________________   [place] / [place] / ED COURSE  patient is currently on Levothroid and epinephrine drips. Blood pressures in the 70s heart rates about 101 O2 sats when we can obtain them were in the 90s and now are unobtainable I did do a blood gas by some stick with ultrasound guidance myself. I'm waiting results of that         ____________________________________________   FINAL CLINICAL IMPRESSION(S) / ED DIAGNOSES  Final diagnoses:  Successful cardiopulmonary resuscitation  ST elevation myocardial infarction (STEMI), unspecified artery [facility])     ED Discharge Orders    None       Note:  This document was prepared using Dragon voice recognition software and may include unintentional dictation errors.    Nena Polio, MD February 18, 2018 2208

## 2018-02-19 NOTE — H&P (Signed)
[facility] [place] NAME: Audrey Mcgee    MR#:  [id number]  DATE OF BIRTH:  [birth year]  DATE OF ADMISSION:  02/17/18  PRIMARY CARE PHYSICIAN: Donnie Coffin, MD   REQUESTING/REFERRING PHYSICIAN:   CHIEF COMPLAINT:  No chief complaint on file.   HISTORY OF PRESENT ILLNESS: Audrey Mcgee  is a 57 y.o. female with a known history of man's metastatic lung cancer and COPD.  Patient is currently intubated, on pressors, status post CPR, unable to provide any history.  Permission was taken from reviewing the medical chart and from the discussion with ER physician and paramedics.  Patient was found unresponsive at home and she was resuscitated by EMS.  She coded 3 times by the time she arrived to emergency room and then she coded again 1 more time in the emergency room.  EKG done in the emergency room initially showed wide-complex rhythm, than it showed ST elevation anterolaterally.  Cardiology evaluated the patient and felt that she was not a good candidate for cardiac cath, due to hypotension advanced metastatic disease and high risk for bleeding. Patient is admitted to intensive care unit for further management.   PAST MEDICAL HISTORY:   Past Medical History:  Diagnosis Date  . COPD (chronic obstructive pulmonary disease) ([place])   . Dementia   . Hypertension   . Lung cancer ([place])   . Thyroid disease     PAST SURGICAL HISTORY:  Past Surgical History:  Procedure Laterality Date  . CESAREAN SECTION CLASSICAL    . STOMACH SURGERY     Pt reports for a tumor    SOCIAL HISTORY:  Social History   Tobacco Use  . Smoking status: Current Every Day Smoker    Packs/day: 0.25    Years: 15.00    Pack years: 3.75    Types: Cigarettes  . Smokeless tobacco: Never Used  Substance Use Topics  . Alcohol use: No    Alcohol/week: 1.2 oz    Types: 2 Cans of beer per week    FAMILY HISTORY:  Family History  Problem Relation Age of  Onset  . Hypertension Father   . Cancer Father   . Diabetes Father   . Cancer Maternal Aunt   . Cancer Mother     DRUG ALLERGIES:  Allergies  Allergen Reactions  . No Known Allergies     REVIEW OF SYSTEMS:  Not able to obtain as patient is intubated, nonverbal, unresponsive.  MEDICATIONS AT HOME:  Prior to Admission medications   Medication Sig Start Date End Date Taking? Authorizing Provider  ALPRAZolam (XANAX) 0.25 MG tablet Take 1 tablet (0.25 mg total) by mouth 3 (three) times daily as needed for anxiety. 05/06/17   Bettey Costa, MD  [facility] ASPIRIN LOW STRENGTH 81 MG EC tablet TAKE 1 TABLET BY MOUTH EVERY DAY 12/28/17   Lequita Asal, MD  docusate sodium (COLACE) 100 MG capsule Take 100 mg by mouth 2 (two) times daily as needed for mild constipation.    [provider]  feeding supplement (BOOST / RESOURCE BREEZE) LIQD Take 1 Container by mouth 2 (two) times daily between meals. 02/12/17   [name], MD  folic acid (FOLVITE) 299 MCG tablet Take 800 mcg by mouth daily.     [provider]  ipratropium-albuterol (DUONEB) 0.5-2.5 (3) MG/3ML SOLN Take 3 mLs by nebulization every 4 (four) hours. 02/12/17   [name], MD  levothyroxine (SYNTHROID, LEVOTHROID) 100 MCG tablet Take  1 tablet (100 mcg total) by mouth daily before breakfast. 12/23/17   Karen Kitchens, NP  lidocaine (XYLOCAINE) 2 % solution Use as directed 10 mLs in the mouth or throat every 4 (four) hours as needed for mouth pain.    [provider]  magnesium oxide (MAG-OX) 400 MG tablet Take 1 tablet (400 mg total) by mouth 2 (two) times daily. 03/04/17   [name], MD  megestrol (MEGACE) 40 MG/ML suspension Take 5 mLs (200 mg total) by mouth 2 (two) times daily. 04/24/17   Lequita Asal, MD  methimazole (TAPAZOLE) 5 MG tablet Take 1 tablet (5 mg total) by mouth daily. 03/19/17   [name], MD  metoprolol (LOPRESSOR) 50 MG tablet Take 0.5 tablets (25 mg total) by mouth 2 (two)  times daily. 03/04/17   [name], MD  morphine (ROXANOL) 20 MG/ML concentrated solution Take 0.5 mLs (10 mg total) by mouth every 4 (four) hours as needed for severe pain. 11/18/17   Lequita Asal, MD  omeprazole (PRILOSEC) 20 MG capsule TAKE 1 CAPSULE BY MOUTH EVERY DAY 08/07/17   Lequita Asal, MD  ondansetron (ZOFRAN) 4 MG tablet Take 1 tablet (4 mg total) by mouth every 6 (six) hours as needed for nausea. 02/12/17   [name], MD  potassium chloride 20 MEQ TBCR Take 1 tab twice a day 03/04/17   [name], MD  [facility] HANDIHALER 18 MCG inhalation capsule INHALE 1 CAPSULE VIA HANDIHALER ONCE DAILY AT THE SAME TIME EVERY DAY 09/28/17   Lequita Asal, MD  thiamine (VITAMIN B-1) 100 MG tablet Take 1 tablet (100 mg total) by mouth daily. 02/21/17   [name], MD  traMADol (ULTRAM) 50 MG tablet Take 1 tablet (50 mg total) by mouth every 8 (eight) hours as needed. 10/22/17   Lequita Asal, MD  traZODone (DESYREL) 50 MG tablet Take 1 tablet (50 mg total) by mouth at bedtime as needed for sleep. Patient not taking: Reported on 04/29/2017 04/24/17   Lequita Asal, MD      PHYSICAL EXAMINATION:   VITAL SIGNS: Blood pressure (!) 77/60, pulse 97, resp. rate 18, height 5\' 4"  (1.626 m), weight 45.4 kg (100 lb), last menstrual period 01/30/2008, SpO2 (!) 57 %.  GENERAL:  57 y.o.-year-old patient lying in the bed, intubated, nonverbal, unresponsive. EYES: No scleral icterus.  HEENT: Head atraumatic, normocephalic. NECK: Bilateral supraclavicular lymphadenopathy is noted. LUNGS: Reduced breath sounds and scattered wheezing noted bilaterally. CARDIOVASCULAR: S1, S2 normal. No S3/S4.  ABDOMEN: Soft, nondistended. Bowel sounds present. No organomegaly or mass.  EXTREMITIES: No pedal edema. NEUROLOGIC: Not able to assess, due to patient not following commands. PSYCHIATRIC: Patient is intubated, nonverbal, unresponsive. SKIN: No obvious rash, lesion, or ulcer.   LABORATORY  PANEL:   CBC Recent Labs  Lab 02-12-18 2005  WBC 16.1*  HGB 9.0*  HCT 27.2*  PLT 230  MCV 84.7  MCH 28.0  MCHC 33.1  RDW 18.6*  LYMPHSABS 4.2*  MONOABS 0.5  EOSABS 0.2  BASOSABS 0.0   ------------------------------------------------------------------------------------------------------------------  Chemistries  Recent Labs  Lab 2018/02/12 2005  NA 124*  K 4.6  CL 85*  CO2 15*  GLUCOSE 79  BUN 40*  CREATININE 6.88*  CALCIUM 6.9*  AST 151*  ALT 37  ALKPHOS 270*  BILITOT 7.7*   ------------------------------------------------------------------------------------------------------------------ estimated creatinine clearance is 6.7 mL/min (A) (by C-G formula based on SCr of 6.88 mg/dL (H)). ------------------------------------------------------------------------------------------------------------------ No results for input(s): TSH, T4TOTAL, T3FREE, THYROIDAB in  the last 72 hours.  Invalid input(s): FREET3   Coagulation profile No results for input(s): INR, PROTIME in the last 168 hours. ------------------------------------------------------------------------------------------------------------------- No results for input(s): DDIMER in the last 72 hours. -------------------------------------------------------------------------------------------------------------------  Cardiac Enzymes Recent Labs  Lab 2018/02/18 2005  TROPONINI 0.03*   ------------------------------------------------------------------------------------------------------------------ Invalid input(s): POCBNP  ---------------------------------------------------------------------------------------------------------------  Urinalysis    Component Value Date/Time   COLORURINE AMBER (A) 03/13/2017 0034   APPEARANCEUR CLEAR (A) 03/13/2017 0034   APPEARANCEUR Clear 11/10/2013 0918   LABSPEC 1.034 (H) 03/13/2017 0034   LABSPEC 1.018 11/10/2013 0918   PHURINE 6.0 03/13/2017 0034   GLUCOSEU NEGATIVE  03/13/2017 0034   GLUCOSEU Negative 11/10/2013 0918   HGBUR NEGATIVE 03/13/2017 0034   BILIRUBINUR NEGATIVE 03/13/2017 0034   BILIRUBINUR Negative 11/10/2013 0918   KETONESUR NEGATIVE 03/13/2017 0034   PROTEINUR NEGATIVE 03/13/2017 0034   NITRITE NEGATIVE 03/13/2017 0034   LEUKOCYTESUR NEGATIVE 03/13/2017 0034   LEUKOCYTESUR Negative 11/10/2013 0918     RADIOLOGY: Dg Chest Portable 1 View  Result Date: 2018-02-18 CLINICAL DATA:  57 year old female status post cardiopulmonary arrest and CPR. Intubated. Decreasing O2 sats. EXAM: PORTABLE CHEST 1 VIEW COMPARISON:  2013 hr today. FINDINGS: Supine view at 2139 hrs. The enteric tube has been removed since the prior study. The endotracheal tube tip is in good position about 15 millimeters above the carina. Mediastinal contours remain within normal limits. Patchy left suprahilar and right infrahilar opacity has not significantly changed. No pneumothorax. Pulmonary vascularity appears within normal limits. Moderate to severe gaseous distension of bowel in the upper abdomen which was more included on the prior portable film appears at least in part due to gas distended stomach. There is associated elevation of the left hemidiaphragm which is chronic to a degree, but increased compared to 2018. IMPRESSION: 1. ET tube in good position. 2. Stable pulmonary ventilation with left greater than right perihilar opacity which is nonspecific but may reflect aspiration in this clinical setting. 3. Partially visible dilated gas-filled bowel loops in the abdomen. NG tube decompression of the stomach would likely be helpful, but NGT placement was unsuccessful earlier tonight. Consider follow-up abdomen radiographs. Electronically Signed   By: Genevie Ann M.D.   On: 2018-02-18 22:14   Dg Chest Portable 1 View  Result Date: 02-18-2018 CLINICAL DATA:  Code blue. Endotracheal and orogastric tube placement. Orogastric tube has reportedly been pulled back out by Dr. Cinda Quest after  review of the chest radiograph. EXAM: PORTABLE CHEST 1 VIEW COMPARISON:  None. FINDINGS: Portable AP supine view of the chest was provided. Tip of a gastric tube projects over the right lower lobe. Endotracheal tube tip is seen at the level of the aortic arch. The carina is not well visualized on this exam but based on relative position with the aortic arch, the tip is believed be above the carina. Left perihilar parenchymal rounded opacities noted suspicious for pneumonia. This can mask underlying neoplastic etiologies. Gastric distention with air is identified of the included upper abdomen. Overlying cardiac monitoring devices are present. IMPRESSION: 1. Orogastric tube tip projects over the right lower lobe and reportedly has been pulled out. Otherwise, withdrawal of the tube is recommended. 2. Endotracheal tube tip projects over the superior mediastinum at the level of the aortic arch. 3. Left perihilar and upper lobe airspace opacities suspicious for pneumonia. 4. Gastric distention with air. Electronically Signed   By: Ashley Royalty M.D.   On: Feb 18, 2018 20:53    EKG: Orders placed or performed during the hospital encounter  of February 17, 2018  . EKG 12-Lead  . EKG 12-Lead  . ED EKG  . ED EKG  . EKG 12-Lead  . EKG 12-Lead  . EKG 12-Lead  . EKG 12-Lead    IMPRESSION AND PLAN:  1.  Cardiorespiratory arrest, likely related to compression of the airways from progressing metastatic lung cancer and possibly acute coronary syndrome.  Patient is currently full code.  We will continue supportive care on ventilator and pressors.  Patient is admitted to intensive care unit. 2.  STEMI, not a candidate for invasive cardiac procedures.  We will continue pressure support and aspirin rectally.  3.  Acute renal failure, creatinine is elevated at 6.8.  We will continue IV fluid resuscitation.  We will monitor kidney function closely and avoid nephrotoxic medications. 4.  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  Start IV fluids and continue to monitor sodium level closely. 5.  Advanced metastatic lung cancer, poor prognosis.  We will consult palliative care.  All the records are reviewed and case discussed with ED provider. Management plans discussed with the patient, family and they are in agreement.  CODE STATUS:    Code Status Orders  (From admission, onward)        Start     Ordered   2018-02-17 2210  Full code  Continuous     2018-02-17 2209    Code Status History    Date Active Date Inactive Code Status Order ID Comments User Context   04/29/2017 22:17 05/08/2017 16:45 Full Code [id number]  Harvie Bridge, DO ED   04/21/2017 02:16 04/21/2017 16:48 Full Code [id number]  Saundra Shelling, MD Inpatient   04/14/2017 14:54 04/18/2017 17:03 Full Code [id number]  Bettey Costa, MD Inpatient   03/13/2017 02:15 03/18/2017 18:52 Full Code [id number]  Lance Coon, MD Inpatient   03/02/2017 23:01 03/04/2017 15:20 Full Code [id number]  Lance Coon, MD Inpatient   02/07/2017 10:26 02/21/2017 20:14 Full Code [id number]  Saundra Shelling, MD Inpatient   11/07/2016 06:18 11/11/2016 14:39 Full Code [id number]  Harrie Foreman, MD Inpatient   06/21/2016 11:52 06/22/2016 15:07 Full Code [id number]  [name], MD Inpatient       TOTAL TIME TAKING CARE OF THIS PATIENT: 45 minutes.    Amelia Jo M.D on 2018-02-17 at 10:46 PM  Between 7am to 6pm - Pager - [phone]  After 6pm go to [email or web address] - password [email or web address]  [facility]  Office  [phone]  CC: Primary care physician; Donnie Coffin, MD

## 2018-02-19 NOTE — Progress Notes (Addendum)
Patient came up from ER with  Epi drip, Norei drip and intubated at 100% . Patient lost pulse on ER stretcher , code called , see code record. RN went and informed family and 2 daughters choose to come back with RN and chaplin and witness code. Code called at 22:57.   Rest of family notified by RN and NP and allowed to come back and stay with patient after room was straightened up.  Nursing supervisor here and [facility] notified

## 2018-02-19 NOTE — ED Notes (Signed)
Code STEMI called

## 2018-02-19 NOTE — Death Summary Note (Signed)
DEATH SUMMARY   Patient Details  Name: Audrey Mcgee MRN: [id number] DOB: 1961-04-04  Admission/Discharge Information   Admit Date:  02-27-18  Date of Death:  2018/02/28  Time of Death:  2256/03/23  Length of Stay: 1  Referring Physician: Donnie Coffin, MD   Reason(s) for Hospitalization  Cardiac arrest from lung cancer  Diagnoses  Preliminary cause of death: cardiac arrest/metastatic lung cancer Secondary Diagnoses (including complications and co-morbidities):  Active Problems:   Cardiac arrest [facility])   ACLS STOPPED UPON ARRIVAL TO ICU PROLONGED CARDIAC ARREST    Pertinent Labs and Studies  Significant Diagnostic Studies Dg Chest Portable 1 View  Result Date: February 27, 2018 CLINICAL DATA:  57 year old female status post cardiopulmonary arrest and CPR. Intubated. Decreasing O2 sats. EXAM: PORTABLE CHEST 1 VIEW COMPARISON:  03-23-12 hr today. FINDINGS: Supine view at 03-23-38 hrs. The enteric tube has been removed since the prior study. The endotracheal tube tip is in good position about 15 millimeters above the carina. Mediastinal contours remain within normal limits. Patchy left suprahilar and right infrahilar opacity has not significantly changed. No pneumothorax. Pulmonary vascularity appears within normal limits. Moderate to severe gaseous distension of bowel in the upper abdomen which was more included on the prior portable film appears at least in part due to gas distended stomach. There is associated elevation of the left hemidiaphragm which is chronic to a degree, but increased compared to 2017/03/23. IMPRESSION: 1. ET tube in good position. 2. Stable pulmonary ventilation with left greater than right perihilar opacity which is nonspecific but may reflect aspiration in this clinical setting. 3. Partially visible dilated gas-filled bowel loops in the abdomen. NG tube decompression of the stomach would likely be helpful, but NGT placement was unsuccessful earlier tonight. Consider follow-up  abdomen radiographs. Electronically Signed   By: Genevie Ann M.D.   On: 2018/02/27 22:14   Dg Chest Portable 1 View  Result Date: 02/27/18 CLINICAL DATA:  Code blue. Endotracheal and orogastric tube placement. Orogastric tube has reportedly been pulled back out by Dr. Cinda Quest after review of the chest radiograph. EXAM: PORTABLE CHEST 1 VIEW COMPARISON:  None. FINDINGS: Portable AP supine view of the chest was provided. Tip of a gastric tube projects over the right lower lobe. Endotracheal tube tip is seen at the level of the aortic arch. The carina is not well visualized on this exam but based on relative position with the aortic arch, the tip is believed be above the carina. Left perihilar parenchymal rounded opacities noted suspicious for pneumonia. This can mask underlying neoplastic etiologies. Gastric distention with air is identified of the included upper abdomen. Overlying cardiac monitoring devices are present. IMPRESSION: 1. Orogastric tube tip projects over the right lower lobe and reportedly has been pulled out. Otherwise, withdrawal of the tube is recommended. 2. Endotracheal tube tip projects over the superior mediastinum at the level of the aortic arch. 3. Left perihilar and upper lobe airspace opacities suspicious for pneumonia. 4. Gastric distention with air. Electronically Signed   By: Ashley Royalty M.D.   On: 27-Feb-2018 20:53    Microbiology No results found for this or any previous visit (from the past 240 hour(s)).  Lab Basic Metabolic Panel: Recent Labs  Lab 2018-02-27 03/23/04  NA 124*  K 4.6  CL 85*  CO2 15*  GLUCOSE 79  BUN 40*  CREATININE 6.88*  CALCIUM 6.9*   Liver Function Tests: Recent Labs  Lab 02/27/18 Mar 23, 2004  AST 151*  ALT 37  ALKPHOS 270*  BILITOT 7.7*  PROT 5.5*  ALBUMIN 2.0*   No results for input(s): LIPASE, AMYLASE in the last 168 hours. No results for input(s): AMMONIA in the last 168 hours. CBC: Recent Labs  Lab 03/04/18 2005  WBC 16.1*  NEUTROABS  11.1*  HGB 9.0*  HCT 27.2*  MCV 84.7  PLT 230   Cardiac Enzymes: Recent Labs  Lab 03/04/2018 2005  TROPONINI 0.03*   Sepsis Labs: Recent Labs  Lab 04-Mar-2018 2005  WBC 16.1*       Kaiyla Stahly 02/11/2018, 8:37 AM

## 2018-02-19 NOTE — ED Notes (Signed)
Monitor became unplugged and some vital sign data was lost at that time

## 2018-02-19 NOTE — ED Provider Notes (Signed)
[place]  Department of Emergency Medicine   Code Blue CONSULT NOTE  Chief Complaint: Cardiac arrest/unresponsive   Level V Caveat: Unresponsive  History of present illness: I was contacted by the hospital for a CODE BLUE cardiac arrest upstairs and presented to the patient's bedside.  Patient was having a cardiac arrest.  Already intubated, Lucas device already in place.  Upon arrival to the ICU it was told by ICU staff that patient was not in need of airway or other ER physician and that they were comfortable continuing care on ICU level without emergency medicine intervention.  ROS: Unable to obtain, Level V caveat  Scheduled Meds: . [START ON 02/06/2018] aspirin  300 mg Rectal Daily  . docusate sodium  100 mg Oral BID  . heparin  5,000 Units Subcutaneous Q8H  . ipratropium-albuterol  3 mL Nebulization Q4H   Continuous Infusions: . sodium chloride    . epinephrine 18 mcg/min (February 22, 2018 2116)  . epinephrine    . norepinephrine (LEVOPHED) Adult infusion    . sodium chloride     PRN Meds:.acetaminophen **OR** acetaminophen, bisacodyl, HYDROcodone-acetaminophen, LORazepam, morphine injection, ondansetron **OR** ondansetron (ZOFRAN) IV, traZODone Past Medical History:  Diagnosis Date  . COPD (chronic obstructive pulmonary disease) ([place])   . Dementia   . Hypertension   . Lung cancer ([place])   . Thyroid disease    Past Surgical History:  Procedure Laterality Date  . CESAREAN SECTION CLASSICAL    . STOMACH SURGERY     Pt reports for a tumor   Social History   Socioeconomic History  . Marital status: Single    Spouse name: Not on file  . Number of children: Not on file  . Years of education: Not on file  . Highest education level: Not on file  Social Needs  . Financial resource strain: Not on file  . Food insecurity - worry: Not on file  . Food insecurity - inability: Not on file  . Transportation needs - medical: Not on file  . Transportation needs -  non-medical: Not on file  Occupational History  . Occupation: disabled  Tobacco Use  . Smoking status: Current Every Day Smoker    Packs/day: 0.25    Years: 15.00    Pack years: 3.75    Types: Cigarettes  . Smokeless tobacco: Never Used  Substance and Sexual Activity  . Alcohol use: No    Alcohol/week: 1.2 oz    Types: 2 Cans of beer per week  . Drug use: No  . Sexual activity: No  Other Topics Concern  . Not on file  Social History Narrative  . Not on file   Allergies  Allergen Reactions  . No Known Allergies     Last set of Vital Signs (not current) Vitals:   22-Feb-2018 2211 02-22-2018 2230  BP: (!) 80/59 (!) 77/60  Pulse: (!) 101 97  Resp:    SpO2:        Physical Exam Gen: unresponsive Cardiovascular: pulseless  Resp: apneic. Breath sounds equal bilaterally with bagging  Abd: nondistended  Neuro: GCS 3, unresponsive to pain  HEENT: No blood in posterior pharynx, gag reflex absent  Neck: No crepitus  Musculoskeletal: No deformity  Skin: warm   Cardiopulmonary Resuscitation (CPR) Procedure Note n/a   Medical Decision making  Patient already in cardiac arrest with intubation and excellent care being provided by ICU staff, they did not need any further care or assistance from me and they dismissed me shortly  after arrival.  Assessment and Plan  Deferring care to ICU team    Schuyler Amor, MD February 10, 2018 2351

## 2018-02-19 NOTE — Progress Notes (Signed)
Family stayed at bedside for a while, then left, lines , airway, IJ  and I/O removed, belongings (gown and silver colored necklace ) given to daughter [name] . Eye prep begun and eye pads applied and [facility] notified . Pt to morgue.

## 2018-02-19 NOTE — Progress Notes (Signed)
    Called for Code STEMI regarding this patient.  Discussed with ED physician. She is post cardiac arrest, on increasing doses of pressors.   I reviewed the ECG which showed some anterior and inferior ST elevations, after she was resuscitated. Review of the chart shows that she was in hospice for metastatic lung CA, but was discharged from hospice.  In November, there was a concern or brain metastases due to severe headaches requiring Roxanol.    SHe was seen by cardiology in May 2018 and CT of the chest showed no coronary calcifications at that time.  LVEF was normal.  ECG changes could be related to demand ischemia from hypotension as well.   Given her overall prognosis from her lung cancer and potential issues with bleeding due to widely metastatic disease, I do not think she is a cath candidate, nor do I think PCI will have a significant impact on her long term survival.   Jettie Booze, MD

## 2018-02-19 NOTE — Progress Notes (Signed)
[facility] Progress Note Patient Name: Audrey Mcgee DOB: 1961/05/02 MRN: [id number]   Date of Service  Feb 27, 2018  HPI/Events of Note  Responded to call for code. PEA , patient had had already 4 cardiac arrest prior to this one.   eICU Interventions  Multiple rounds of epi and bicarb amp given without ROSC after mor than 10 mins ACLS protocol. NP at bedside pronounced. Family was at bedside witnessing the code.      Intervention Category Major Interventions: Code management / supervision  Sharia Reeve 02-27-2018, 10:59 PM

## 2018-02-19 NOTE — ED Triage Notes (Signed)
Pt arrived from home with cpr in progress.

## 2018-02-19 DEATH — deceased

## 2018-03-04 ENCOUNTER — Other Ambulatory Visit: Payer: Self-pay | Admitting: Nurse Practitioner
